# Patient Record
Sex: Male | Born: 1960
Health system: Southern US, Community
[De-identification: ages and names within clinical notes are randomized; demographics above are authoritative.]

## PROBLEM LIST (undated history)

## (undated) DIAGNOSIS — E119 Type 2 diabetes mellitus without complications: Secondary | ICD-10-CM

## (undated) DIAGNOSIS — Z8719 Personal history of other diseases of the digestive system: Secondary | ICD-10-CM

## (undated) DIAGNOSIS — M79605 Pain in left leg: Secondary | ICD-10-CM

## (undated) DIAGNOSIS — G4733 Obstructive sleep apnea (adult) (pediatric): Secondary | ICD-10-CM

## (undated) DIAGNOSIS — E785 Hyperlipidemia, unspecified: Secondary | ICD-10-CM

## (undated) DIAGNOSIS — M545 Low back pain, unspecified: Secondary | ICD-10-CM

## (undated) DIAGNOSIS — I480 Paroxysmal atrial fibrillation: Secondary | ICD-10-CM

## (undated) DIAGNOSIS — I2510 Atherosclerotic heart disease of native coronary artery without angina pectoris: Secondary | ICD-10-CM

## (undated) DIAGNOSIS — F32A Depression, unspecified: Secondary | ICD-10-CM

## (undated) DIAGNOSIS — M4310 Spondylolisthesis, site unspecified: Secondary | ICD-10-CM

## (undated) DIAGNOSIS — G43909 Migraine, unspecified, not intractable, without status migrainosus: Secondary | ICD-10-CM

## (undated) DIAGNOSIS — I10 Essential (primary) hypertension: Secondary | ICD-10-CM

## (undated) DIAGNOSIS — M48061 Spinal stenosis, lumbar region without neurogenic claudication: Secondary | ICD-10-CM

## (undated) DIAGNOSIS — M797 Fibromyalgia: Secondary | ICD-10-CM

## (undated) DIAGNOSIS — K219 Gastro-esophageal reflux disease without esophagitis: Secondary | ICD-10-CM

## (undated) DIAGNOSIS — F419 Anxiety disorder, unspecified: Secondary | ICD-10-CM

## (undated) DIAGNOSIS — I219 Acute myocardial infarction, unspecified: Secondary | ICD-10-CM

## (undated) DIAGNOSIS — G8929 Other chronic pain: Secondary | ICD-10-CM

## (undated) DIAGNOSIS — F329 Major depressive disorder, single episode, unspecified: Secondary | ICD-10-CM

## (undated) HISTORY — DX: Pain in left leg: M79.605

## (undated) HISTORY — DX: Paroxysmal atrial fibrillation: I48.0

## (undated) HISTORY — DX: Low back pain, unspecified: M54.50

## (undated) HISTORY — DX: Acute myocardial infarction, unspecified: I21.9

## (undated) HISTORY — DX: Spondylolisthesis, site unspecified: M43.10

## (undated) HISTORY — DX: Spinal stenosis, lumbar region without neurogenic claudication: M48.061

---

## 1998-05-11 HISTORY — PX: INGUINAL HERNIA REPAIR: SUR1180

## 2003-05-12 DIAGNOSIS — I219 Acute myocardial infarction, unspecified: Secondary | ICD-10-CM

## 2003-05-12 HISTORY — DX: Acute myocardial infarction, unspecified: I21.9

## 2004-03-11 DIAGNOSIS — I2510 Atherosclerotic heart disease of native coronary artery without angina pectoris: Secondary | ICD-10-CM

## 2004-03-11 HISTORY — PX: CORONARY ANGIOPLASTY WITH STENT PLACEMENT: SHX49

## 2004-03-11 HISTORY — DX: Atherosclerotic heart disease of native coronary artery without angina pectoris: I25.10

## 2004-04-07 ENCOUNTER — Ambulatory Visit: Payer: Self-pay | Admitting: Cardiology

## 2004-04-07 ENCOUNTER — Ambulatory Visit: Payer: Self-pay | Admitting: *Deleted

## 2004-04-07 ENCOUNTER — Inpatient Hospital Stay (HOSPITAL_COMMUNITY): Admission: AD | Admit: 2004-04-07 | Discharge: 2004-04-10 | Payer: Self-pay | Admitting: *Deleted

## 2008-05-11 DIAGNOSIS — E119 Type 2 diabetes mellitus without complications: Secondary | ICD-10-CM

## 2008-05-11 HISTORY — DX: Type 2 diabetes mellitus without complications: E11.9

## 2008-10-01 ENCOUNTER — Encounter: Admission: RE | Admit: 2008-10-01 | Discharge: 2008-10-01 | Payer: Self-pay | Admitting: Gastroenterology

## 2010-09-26 NOTE — Cardiovascular Report (Signed)
[name], [name]               ACCOUNT NO.:  [id number]   MEDICAL RECORD NO.:  [id number]          PATIENT TYPE:  INP   LOCATION:  2899                         FACILITY:  [facility]   PHYSICIAN:  Carole Binning, M.D. LHCDATE OF BIRTH:  July 07, 1960   DATE OF PROCEDURE:  04/07/2004  DATE OF DISCHARGE:                              CARDIAC CATHETERIZATION   PROCEDURE PERFORMED:  1.  Left heart catheterization with coronary angiography and left      ventriculography.  2.  Percutaneous coronary intervention with placement of three drug-eluting      stents in the proximal and mid right coronary artery.   INDICATION:  [name] is a 50 year old male who presented to [facility] yesterday with substernal chest pain.  This morning at  approximately 4 a.m. he developed severe recurrent substernal chest pain.  Followup EKG showed 1/2 to 1 mm inferior ST segment elevation with Q waves  in the inferior leads.  He was seen by Dr. Tomie China and transferred  emergently to [facility].  He arrived here at approximately 11:30  a.m. which was approximately 7 1/2 hours after onset of chest pain.   CATHETERIZATION PROCEDURAL NOTE:  A 6 French sheath was placed in the right  femoral artery.  Coronary angiography was performed with standard Judkins 6  French catheters.  Left ventriculography was performed with an angled  pigtail catheter.  Contrast was Omnipaque.  There were no complications.   CATHETERIZATION RESULTS:   HEMODYNAMICS:  1.  Left ventricular pressure 106/22.  2.  Aortic pressure 110/86.  3.  There is no aortic valve gradient.   LEFT VENTRICULOGRAM:  There is severe akinesis of the inferior wall.  Ejection fraction is calculated at 47%.  There is no mitral regurgitation.   CORONARY ARTERIOGRAPHY:  Left main is normal.   Left anterior descending artery has a 30% stenosis in the proximal vessel,  40% in the mid vessel, and 40% in the distal vessel.  The LAD gives rise  to  two small diagonal branches.   Left circumflex gives rise to a large ramus intermedius, large first obtuse  marginal branch, and a small second obtuse marginal branch.  The ramus  intermedius has a diffuse 50% stenosis in the mid body.  The first obtuse  marginal has a 40% stenosis at its ostium and a 75% stenosis in the mid  segment.  The second obtuse marginal has a 40% stenosis proximally and a 75%  stenosis in the mid vessel.   Right coronary artery is a dominant vessel.  There is diffuse 90% stenosis  in the proximal and mid vessel.  The distal right coronary just beyond the  acute marginal is 100% occluded with thrombus and TIMI-0 flow.  After we  established reperfusion the distal right coronary artery was found to be a  large vessel.  There was an 80% stenosis beyond the regional site of  occlusion.  Further down in the distal vessel just beyond a large posterior  descending artery there is a 30% stenosis.  The distal right coronary artery  gives rise to  a large posterior descending artery, normal size first  posterior lateral branch, large second posterior lateral branch, and a small  third posterior lateral branch.  The first posterior lateral branch has a  60% stenosis at the ostium and the second posterior lateral branch has a 30%  stenosis in the proximal body.   IMPRESSION:  1.  Mildly decreased left ventricular systolic function with akinesis of the      inferior wall secondary to an acute inferior wall myocardial infarction.  2.  Two-vessel coronary artery disease characterized by 100% occlusion of      the distal right coronary artery with significant disease in the      proximal and mid vessel as well.  There is moderate disease in the left      circumflex artery as described.   PLAN:  Percutaneous intervention of the right coronary artery.  See below.   PERCUTANEOUS TRANSLUMINAL CORONARY ANGIOPLASTY PROCEDURAL NOTE:  Following  completion of the diagnostic  catheterization, we proceeded directly with  percutaneous coronary intervention.  A 6 French sheath was placed in the  right femoral vein.  Heparin and integrilin were administered per protocol.  We used a 6 [place] JR-4 guiding catheter.  A Hi-Torque Floppy wire was  advanced under fluoroscopic guidance and advanced successfully beyond the  occlusion into the position of the distal vessel.  This did establish  partial reperfusion.  We then performed percutaneous transluminal coronary  angioplasty with a 3.0 x 15-mm Maverick balloon.  This balloon was inflated  to 6 atmospheres for two inflations in the distal vessel, 12 atmospheres in  the mid vessel and 12 atmospheres in the proximal vessel.  This resulted in  improvement in the vessel.  However, there was development of a moderate  spiral dissection in the mid vessel.  We then advanced a 3.5 x 28-mm Cypher  drug-eluting stent and positioned this across the diseased segment of the  distal vessel with extending just proximal to the acute marginal.  This  stent was deployed at 12 atmospheres.  We then advanced a second 3.5 x 33-mm  Cypher drug-eluting stent and positioned this in the mid vessel with minimal  overlap of the just placed stent in the distal vessel.  This stent was  deployed at 16 atmospheres.  After the second stent there was residual  disease in the proximal vessel.  We therefore advanced a third 3.5 x 13-mm  Cypher drug-eluting stent and positioned this in the proximal vessel with  overlap in the stent in the mid vessel.  This stent was deployed at 20  atmospheres.  Following this, we went back with a 3.75 x 20-mm Quantum  balloon and inflated this to 18 atmospheres in the distal aspects of the  overlapping stents followed by multiple inflations of 20 atmospheres  throughout the mid and proximal aspects of the stents.  We then went back one more time into the distal aspect of the stent and inflated the balloon  to 22  atmospheres.  Intermittent doses of intracoronary nitroglycerin were  administered and final angiographic images were obtained revealing patency  of the right coronary with 0% residual stenosis at the stent site and TIMI-3  flow into the distal vessel.   COMPLICATIONS:  None.   RESULTS:  Successful percutaneous coronary intervention with placement of  three overlapping drug-eluting stents in the proximal, mid, and distal right  coronary artery.  The distal vessel was reduced from 100% followed by an 80%  stenosis  with TIMI-0 flow to 0% residual with TIMI-3 flow.  The proximal and  mid vessel was reduced from a diffuse 90% stenosis to 0% residual with TIMI-  3 flow.   PLAN:  Integrilin will be continued for 24 hours.  The patient will be  started on Plavix which should be continued for a minimum of one year.  I  would recommend aggressive risk factor modification.  Would also recommend a  stress nuclear scan in 4-6 weeks to assess the significance of the residual  disease in the left circumflex coronary artery.       MWP/MEDQ  D:  04/07/2004  T:  04/07/2004  Job:  [id number]   cc:   Windle Guard, M.D.  [address]  [place], [place] [address]  Fax: [phone]   Aundra Dubin. Revankar, M.D.  [address]  [place]  [place] [address]  Fax: [phone]

## 2010-09-26 NOTE — Discharge Summary (Signed)
[name], [name]               ACCOUNT NO.:  [id number]   MEDICAL RECORD NO.:  [id number]          PATIENT TYPE:  INP   LOCATION:  3703                         FACILITY:  [facility]   PHYSICIAN:  Learta Codding, M.D. LHCDATE OF BIRTH:  November 21, 1960   DATE OF ADMISSION:  04/07/2004  DATE OF DISCHARGE:  04/10/2004                           DISCHARGE SUMMARY - REFERRING   PROCEDURE:  Emergent coronary angiography/Cypher stenting right coronary  artery, April 07, 2004.   REASON FOR ADMISSION:  [name] is a 50 year old male with no prior  history of coronary artery disease, who initially presented to [facility] with chest pain.  He was found to have ST elevation inferiorly and,  following, stabilization, was transferred to [facility] for further  management.  Please refer to dictated admission note for full details.   LABORATORY DATA:  Cardiac enzymes:  Peak CPK 116, 1/76; troponin I 40.9 on  admission.  Lipid profile:  Total cholesterol 161, triglycerides 361, HDL  22, LDL 72 (cholesterol/HDL ratio 7.3).  TSH 0.96.  Hemoglobin A1c 10.2.  Electrolytes and renal function remained normal.  CBC normal, with  hemoglobin 14.7 on admission.   HOSPITAL COURSE:  Following transfer from [facility], the patient  continued to have chest discomfort and was thus taken directly to the  cardiac catheterization lab for emergent coronary angiography and  intervention.   Procedure performed ( by Dr. Daisey Must ), revealed total occlusion of  the mid-right coronary artery, as well as 90% proximal disease and 80%  distally.  The patient underwent successful treatment with Cypher stenting  (x 3) of the right coronary artery to 0% residual stenosis.  There were no  noted complications.   The patient will need to remain on Plavix for one year.  He will need a  follow-up exercise perfusion study in four to six weeks for assessment of  residual circumflex disease.   Postoperative  course benign, with no documented dysrhythmia or complaints of  chest pain.   Medications were adjusted as tolerated.  The patient was placed on glyburide  for management of new onset type 2 diabetes mellitus.  He is to follow up  with his primary care physician for further management.  Recommendation was  to consider addition of Glucophage as an outpatient.   At discharge, the patient was provided with mail-order forms for pharmacy  outreach assistance for his prescription medications.   MEDICATIONS AT DISCHARGE:  1.  Plavix 75 mg daily (x 1 year).  2.  Coated aspirin 325 mg daily.  3.  Mevacor 40 mg daily.  4.  Lisinopril 20 mg daily.  5.  Lopressor 25 mg b.i.d.  6.  Glyburide 5 mg daily.  7.  Zantac 150 mg b.i.d.  8.  Nitrostat 0.4 mg p.r.n.   INSTRUCTIONS:  1.  No strenuous activity/heavy lifting or return to work until seen by the      physician.  2.  Maintain a lowfat/cholesterol diabetic diet.  3.  Patient instructed to arrange followup with Dr. Glean Hess [name] in two      weeks.  4.  Patient instructed to follow up with his primary care physician, Dr.      Windle [name], in the next few weeks for management of diabetes.  5.  Exercise- cardiolite stress study in 4-6 weeks to assess residual      circumflex coronary artery disease.   DISCHARGE DIAGNOSES:  1.  ST elevation inferior myocardial infarction.      1.  Emergent Cypher stenting right coronary artery (x 3), April 07, 2004.      2.  Residual circumflex disease.      3.  Mild left ventricular dysfunction (ejection fraction 47%).  2.  Type 2 diabetes mellitus.  3.  Dyslipidemia.  4.  Gastroesophageal reflux disease.      [name] [name]   GS/MEDQ  D:  04/10/2004  T:  04/10/2004  Job:  [id number]   cc:   Aundra Dubin. [name], M.D.  [address]  [place]  [place] [address]  Fax: [phone]   [name] [name], M.D.  [address]  [place], [place] [address]  Fax: [phone]

## 2010-09-26 NOTE — H&P (Signed)
[name] [name], [name] [name]               ACCOUNT NO.:  [id number]   MEDICAL RECORD NO.:  [id number]          PATIENT TYPE:  OIB   LOCATION:  2899                         FACILITY:  [facility]   PHYSICIAN:  Learta Codding, M.D. LHCDATE OF BIRTH:  11/04/1960   DATE OF ADMISSION:  04/07/2004  DATE OF DISCHARGE:                                HISTORY & PHYSICAL   PRIMARY CARE PHYSICIAN:  Windle Guard, M.D.   PRIMARY CARDIOLOGIST:  Aundra Dubin. Revankar, M.D., [place], [place].   HISTORY OF PRESENT ILLNESS:  The patient is a 50 year old white male who was  referred to [facility] for a cardiac evaluation, secondary  to chest discomfort.  He stated that since Saturday he just has not felt  well.  He further elaborates with anterior chest indigestion feeling,  associated with shortness of breath.  He denies any nausea, vomiting, or  diaphoresis.  On Saturday, he took Tagamet which initially improved his  symptoms.  He stated that he laid around all day Saturday and Sunday;  however, he continued to have intermittent symptoms.  He stated that they  ranged from a 0-8 on a scale of 0/10.  The duration would usually be about  30-40 minutes.  He denies any occurrences.  This morning he was awakened at  around 2:30 a.m. with more intense symptoms.  He gave this 12 on a scale of  0/10.  He took some more Tagamet without any relief, and around 4 a.m. his  girlfriend drove him to the emergency room for further evaluation.  At  [facility] Emergency Room he received aspirin, Lopressor IV 5 mg x3,  morphine 2 mg IV x2, a GI cocktail, IV nitroglycerin and IV heparin.  His  electrocardiogram performed at 5 a.m. showed a normal sinus rhythm, normal  axis, incomplete right bundle branch block.  He has approximately 0.5 to 1.0  mm ST segment elevation inferiorly in lead III and aVF, with T-wave  inversion.  Old electrocardiograms are not available for comparison.   ALLERGIES:  No known  drug allergies.   PAST MEDICAL HISTORY:  1.  He has a history of diet-controlled diabetes.  Prior to this weekend his      sugars ranged from 110-130's.  He denies any associated neuropathies.      This weekend on Saturday his sugars began increasing to the 200-300's.  2.  He also has a history of a hiatal hernia/gastroesophageal reflux disease      and status post right inguinal hernia repair.  He does not know his cholesterol status.  He denies a history of prior  myocardial infarction, CVA, hypertension, chronic obstructive pulmonary  disease, bleeding, or thyroid disorders.   CURRENT MEDICATIONS:  1.  He takes Tagamet on a daily basis.   SOCIAL HISTORY:  He resides in [place] with his girlfriend of five years,  [name].  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  He is actively involved in horses.  He denies any history  of tobacco use.  Drinks three to four beers per  week.  He denies any drug or  herbal medication use, and follows a low sugar diet.  He does not exercise  on a regular basis.   FAMILY HISTORY:  His mother is alive at age 31.  She has multiple heart  problems.  He is not specific as to what type.  His father is age 9, and  has had stents, bypass surgery, diabetes, a CVA and an ICD placement.  His  mother and father are both followed by Dr. Pearletha Furl. [name].  He has a  sister who is alive and well.  She has a history of diabetes.   REVIEW OF SYSTEMS:  Notable for the above-mentioned symptoms as well as  increased nocturia since Saturday.  Occasional diarrhea and gastroesophageal  reflux disease symptoms.   PHYSICAL EXAMINATION:  GENERAL:  A well-developed and well-nourished  pleasant white male, in no apparent distress, although at this time he  states that his discomfort is approximately a 2/10.  He is in the cardiac  catheterization holding area at this time.  VITAL SIGNS:  Blood pressure 123/89, pulse 83,  respirations 20.  HEENT:  Normocephalic and atraumatic.  Pupils equal, round, reactive to  light and accommodation.  EOMs intact.  NECK:  Supple without thyromegaly, adenopathy, jugular venous distention, or  carotid bruits.  HEART:  A regular rate and rhythm.  No murmurs, rubs, clicks, or gallops  appreciated.  All pulses are symmetrical and equal, without carotid or  femoral or abdominal bruits.  SKIN:  Integument was intact.  ABDOMEN:  Obese, bowel sounds present without organomegaly, masses, or  tenderness.  EXTREMITIES:  Negative for clubbing, cyanosis or edema.  MUSCULOSKELETAL:  Unremarkable.  NEUROLOGIC:  Intact.  A chest x-ray from [facility] did not show any active disease.  H&H 18.0 and 51.4, normal indices, platelets 228, WBC's 7.1.  Sodium 138,  potassium 3.9, BUN 15, creatinine 1.0, glucose 306.  His liver function  tests were notable for an elevated ALT of 81 and an ALP of 138.  PTT was  32.2, PT 11.6.  Initial CK was 125, troponin 0.11.  Second CK at [facility] was 153 and a troponin of 0.41.   IMPRESSION:  1.  ST-elevated myocardial infarction in the inferior distribution.  2.  Elevated liver function tests, of uncertain etiology.  3.  Hyperglycemia, related to the myocardial infarction, with a history of      diabetes.   DISPOSITION:  Dr. Learta Codding reviewed the patient's history and spoke  with and examined the patient.  He agrees with above.  The patient will  undergo an emergent cardiac catheterization.  During his hospitalization, we  will also check his lipids and hemoglobin A1c.  We will recheck his liver  function tests, in the anticipation that he will need a statin drug.  Further plan will be post-cardiac catheterization.      [name]   EW/MEDQ  D:  04/07/2004  T:  04/07/2004  Job:  [id number]

## 2011-08-05 ENCOUNTER — Emergency Department (HOSPITAL_COMMUNITY)
Admission: EM | Admit: 2011-08-05 | Discharge: 2011-08-06 | Disposition: A | Discharge status: Home / Self Care | Payer: Self-pay | Attending: Emergency Medicine | Admitting: Emergency Medicine

## 2011-08-05 ENCOUNTER — Encounter (HOSPITAL_COMMUNITY): Payer: Self-pay | Admitting: Emergency Medicine

## 2011-08-05 ENCOUNTER — Emergency Department (HOSPITAL_COMMUNITY): Payer: Self-pay

## 2011-08-05 DIAGNOSIS — E119 Type 2 diabetes mellitus without complications: Secondary | ICD-10-CM | POA: Insufficient documentation

## 2011-08-05 DIAGNOSIS — I2510 Atherosclerotic heart disease of native coronary artery without angina pectoris: Secondary | ICD-10-CM | POA: Insufficient documentation

## 2011-08-05 DIAGNOSIS — R079 Chest pain, unspecified: Secondary | ICD-10-CM | POA: Insufficient documentation

## 2011-08-05 DIAGNOSIS — Z9889 Other specified postprocedural states: Secondary | ICD-10-CM | POA: Insufficient documentation

## 2011-08-05 DIAGNOSIS — Z79899 Other long term (current) drug therapy: Secondary | ICD-10-CM | POA: Insufficient documentation

## 2011-08-05 HISTORY — DX: Atherosclerotic heart disease of native coronary artery without angina pectoris: I25.10

## 2011-08-05 LAB — CBC
HCT: 45 % (ref 39.0–52.0)
Hemoglobin: 16.5 g/dL (ref 13.0–17.0)
MCH: 31.1 pg (ref 26.0–34.0)
MCHC: 36.7 g/dL — ABNORMAL HIGH (ref 30.0–36.0)
MCV: 84.7 fL (ref 78.0–100.0)
Platelets: 157 10*3/uL (ref 150–400)
RBC: 5.31 MIL/uL (ref 4.22–5.81)
RDW: 12.5 % (ref 11.5–15.5)
WBC: 6.5 10*3/uL (ref 4.0–10.5)

## 2011-08-05 LAB — BASIC METABOLIC PANEL
BUN: 10 mg/dL (ref 6–23)
CO2: 23 mEq/L (ref 19–32)
Calcium: 9.2 mg/dL (ref 8.4–10.5)
Chloride: 104 mEq/L (ref 96–112)
Creatinine, Ser: 0.65 mg/dL (ref 0.50–1.35)
GFR calc Af Amer: >90 mL/min (ref >90)
GFR calc non Af Amer: >90 mL/min (ref >90)
Glucose, Bld: 265 mg/dL — ABNORMAL HIGH (ref 70–99)
Potassium: 3.8 mEq/L (ref 3.5–5.1)
Sodium: 139 mEq/L (ref 135–145)

## 2011-08-05 LAB — TROPONIN I: Troponin I: <0.3 ng/mL (ref <0.30)

## 2011-08-05 MED ORDER — GI COCKTAIL ~~LOC~~
30.0000 mL | Freq: Once | ORAL | Status: AC
  Administered 2011-08-05: 30 mL via ORAL
  Filled 2011-08-05: qty 30

## 2011-08-05 MED ORDER — ASPIRIN 81 MG PO CHEW
324.0000 mg | CHEWABLE_TABLET | Freq: Once | ORAL | Status: AC
  Administered 2011-08-05: 324 mg via ORAL
  Filled 2011-08-05: qty 4

## 2011-08-05 MED ORDER — OXYCODONE-ACETAMINOPHEN 5-325 MG PO TABS
1.0000 | ORAL_TABLET | Freq: Once | ORAL | Status: AC
  Administered 2011-08-05: 1 via ORAL
  Filled 2011-08-05: qty 1

## 2011-08-05 NOTE — ED Provider Notes (Signed)
History    51 year old male with chest pain. Gradual onset about 2 days ago. Substernal to left chest. Relatively constant without radiation. No appreciable exacerbating relieving factors. No dyspnea. No nausea, vomiting or palpitations. No unusual leg pain or swelling. Denies history of blood clot. Does have history of known coronary artery disease status post stenting in 2005. Patient is also diabetic. Patient reports compliance with his medications.  CSN: [id number]  Arrival date & time 08/05/11  7829   First MD Initiated Contact with Patient 08/05/11 2100      Chief Complaint  Patient presents with  . Chest Pain    describes as dull chest pressure and L arm soreness also reports indigestion    (Consider location/radiation/quality/duration/timing/severity/associated sxs/prior treatment) HPI  Past Medical History  Diagnosis Date  . Diabetes mellitus   . Coronary artery disease   . Apnea, sleep     Past Surgical History  Procedure Date  . Hernia repair   . Coronary stent placement     History reviewed. No pertinent family history.  History  Substance Use Topics  . Smoking status: Never Smoker   . Smokeless tobacco: Not on file  . Alcohol Use: Yes     occassioanal glass of wine      Review of Systems   Review of symptoms negative unless otherwise noted in HPI.   Allergies  Review of patient's allergies indicates no known allergies.  Home Medications   Current Outpatient Rx  Name Route Sig Dispense Refill  . METFORMIN HCL 500 MG PO TABS Oral Take 500 mg by mouth 2 (two) times daily with a meal.    . NAPROXEN 500 MG PO TABS Oral Take 500 mg by mouth 2 (two) times daily with a meal.      BP 157/86  Pulse 72  Temp(Src) 98.5 F (36.9 C) (Oral)  Resp 18  Ht 5\' 11"  (1.803 m)  Wt 200 lb (90.719 kg)  BMI 27.89 kg/m2  SpO2 98%  Physical Exam  Nursing note and vitals reviewed. Constitutional: He appears well-developed and well-nourished. No distress.    Laying in bed. No Acute distress.  HENT:  Head: Normocephalic and atraumatic.  Eyes: Conjunctivae are normal. Right eye exhibits no discharge. Left eye exhibits no discharge.  Neck: Neck supple.  Cardiovascular: Normal rate, regular rhythm and normal heart sounds.  Exam reveals no gallop and no friction rub.   No murmur heard. Pulmonary/Chest: Effort normal and breath sounds normal. No respiratory distress.  Abdominal: Soft. He exhibits no distension. There is tenderness.       Mild tenderness in epigastrium without rebound or guarding. No distention. No mass palpated.  Musculoskeletal: He exhibits no edema and no tenderness.       Lower extremity are symmetric as compared to each other. No significant edema. No Tenderness.  Neurological: He is alert.  Skin: Skin is warm and dry. He is not diaphoretic.  Psychiatric: He has a normal mood and affect. His behavior is normal. Thought content normal.    ED Course  Procedures (including critical care time)  Labs Reviewed  CBC - Abnormal; Notable for the following:    MCHC 36.7 (*)    All other components within normal limits  BASIC METABOLIC PANEL - Abnormal; Notable for the following:    Glucose, Bld 265 (*)    All other components within normal limits  TROPONIN I   Dg Chest 2 View  08/05/2011  *RADIOLOGY REPORT*  Clinical Data: Chest pain; history of  diabetes.  CHEST - 2 VIEW  Comparison: None.  Findings: The lungs are relatively well-aerated.  Minimal bibasilar airspace opacities likely reflect atelectasis.  There is no evidence of pleural effusion or pneumothorax.  The heart is borderline normal in size; the mediastinal contour is within normal limits.  No acute osseous abnormalities are seen.  IMPRESSION: Minimal bibasilar airspace opacities likely reflect atelectasis; lungs otherwise clear.  Original Report Authenticated By: Tonia Ghent, M.D.   EKG:  Rhythm: Normal sinus rhythm  Rate: 90 Axis: Normal Intervals: Normal ST  segments: Q waves inferiorly Comparison: Q waves noted on prior EKG from 2005. No significant change.   1. Chest pain       MDM  50yM with CP. Consider ACS, especially with cardiac hx. Pain atypical though given constant for several days. EKG with no significant change from previous. Trop normal. Doubt PE. Doubt infectious. Possibly GI and patient is actually tenderness in epigastrium. Low suspicion for emergent etiology. Outpt fu with cardiologist. Return precautions discussed.        Raeford Razor, MD 08/12/11 [phone]

## 2011-08-05 NOTE — ED Notes (Signed)
MD at bedside. 

## 2011-08-05 NOTE — ED Notes (Signed)
Pt report L chest pain onset x3 days Hx CAD with cath and stent placement 2005 possible by [facility]

## 2011-08-05 NOTE — Discharge Instructions (Signed)
Chest Pain (Nonspecific) It is often hard to give a specific diagnosis for the cause of chest pain. There is always a chance that your pain could be related to something serious, such as a heart attack or a blood clot in the lungs. You need to follow up with your caregiver for further evaluation. CAUSES   Heartburn.   Pneumonia or bronchitis.   Anxiety or stress.   Inflammation around your heart (pericarditis) or lung (pleuritis or pleurisy).   A blood clot in the lung.   A collapsed lung (pneumothorax). It can develop suddenly on its own (spontaneous pneumothorax) or from injury (trauma) to the chest.   Shingles infection (herpes zoster virus).  The chest wall is composed of bones, muscles, and cartilage. Any of these can be the source of the pain.  The bones can be bruised by injury.   The muscles or cartilage can be strained by coughing or overwork.   The cartilage can be affected by inflammation and become sore (costochondritis).  DIAGNOSIS  Lab tests or other studies, such as X-rays, electrocardiography, stress testing, or cardiac imaging, may be needed to find the cause of your pain.  TREATMENT   Treatment depends on what may be causing your chest pain. Treatment may include:   Acid blockers for heartburn.   Anti-inflammatory medicine.   Pain medicine for inflammatory conditions.   Antibiotics if an infection is present.   You may be advised to change lifestyle habits. This includes stopping smoking and avoiding alcohol, caffeine, and chocolate.   You may be advised to keep your head raised (elevated) when sleeping. This reduces the chance of acid going backward from your stomach into your esophagus.   Most of the time, nonspecific chest pain will improve within 2 to 3 days with rest and mild pain medicine.  HOME CARE INSTRUCTIONS   If antibiotics were prescribed, take your antibiotics as directed. Finish them even if you start to feel better.   For the next few  days, avoid physical activities that bring on chest pain. Continue physical activities as directed.   Do not smoke.   Avoid drinking alcohol.   Only take over-the-counter or prescription medicine for pain, discomfort, or fever as directed by your caregiver.   Follow your caregiver's suggestions for further testing if your chest pain does not go away.   Keep any follow-up appointments you made. If you do not go to an appointment, you could develop lasting (chronic) problems with pain. If there is any problem keeping an appointment, you must call to reschedule.  SEEK MEDICAL CARE IF:   You think you are having problems from the medicine you are taking. Read your medicine instructions carefully.   Your chest pain does not go away, even after treatment.   You develop a rash with blisters on your chest.  SEEK IMMEDIATE MEDICAL CARE IF:   You have increased chest pain or pain that spreads to your arm, neck, jaw, back, or abdomen.   You develop shortness of breath, an increasing cough, or you are coughing up blood.   You have severe back or abdominal pain, feel nauseous, or vomit.   You develop severe weakness, fainting, or chills.   You have a fever.  THIS IS AN EMERGENCY. Do not wait to see if the pain will go away. Get medical help at once. Call your local emergency services (911 in [place] Do not drive yourself to the hospital. MAKE SURE YOU:   Understand these instructions.     Will watch your condition.   Will get help right away if you are not doing well or get worse.  Document Released: 02/04/2005 Document Revised: 04/16/2011 Document Reviewed: 12/01/2007 [facility] Patient Information 2012 [place], [place].  RESOURCE GUIDE  Dental Problems  Patients with Medicaid: [facility] [id number]                                           [phone] [address] Phone:  [phone]                                                   Phone:  [phone]  If unable to pay or uninsured, contact:  [facility] or [facility] to become qualified for the adult dental clinic.  Chronic Pain Problems Contact [facility]  [phone] Patients need to be referred by their primary care doctor.  Insufficient Money for Medicine Contact [employer]:  call "211" or [facility] [phone].  No Primary Care Doctor Call [facility]  [phone] Other agencies that provide inexpensive medical care    [facility]  [phone]    [facility]  [phone]    [facility]  [phone]    [facility]  [phone]    [facility]  [phone]    [facility]  [phone]  Psychological Services [facility]  [phone] [facility]  [phone] [facility]   [phone] (emergency services [phone])  Substance Abuse Resources [facility]  [phone] [facility] [phone] [facility] [place] [phone] Floydene Flock ([phone] Residential & Outpatient Substance Abuse Program  ([phone]  Abuse/Neglect [facility] Child Abuse Hotline [phone]) 641-6472 [facility] Child Abuse Hotline 602-274-4343 (After Hours)  Emergency Shelter [facility] [phone]  Maternity Homes Room at the [place] [facility] [place] [phone] [place] Services [phone]  MRSA Hotline #:   667 463 8459    [facility] Resources  [facility] [place]     [employer]                          [facility] Dept. [address] [place]                       [address]      [address]  [place]                                                Cristobal Goldmann Phone:  [phone]  Phone:  [phone]                 Phone:  [phone]  [facility] Phone:   [phone]  [facility] Child Abuse Hotline [phone] (After Hours)

## 2011-08-06 NOTE — ED Notes (Signed)
Pt left before discharge instruction could be given. Presented pt with phone numbers to [facility] and [facility]

## 2012-08-01 ENCOUNTER — Emergency Department (HOSPITAL_COMMUNITY)
Admission: EM | Admit: 2012-08-01 | Discharge: 2012-08-01 | Disposition: A | Discharge status: Home / Self Care | Payer: Self-pay | Attending: Emergency Medicine | Admitting: Emergency Medicine

## 2012-08-01 ENCOUNTER — Encounter (HOSPITAL_COMMUNITY): Payer: Self-pay | Admitting: Emergency Medicine

## 2012-08-01 DIAGNOSIS — I2510 Atherosclerotic heart disease of native coronary artery without angina pectoris: Secondary | ICD-10-CM | POA: Insufficient documentation

## 2012-08-01 DIAGNOSIS — Z7982 Long term (current) use of aspirin: Secondary | ICD-10-CM | POA: Insufficient documentation

## 2012-08-01 DIAGNOSIS — M5431 Sciatica, right side: Secondary | ICD-10-CM

## 2012-08-01 DIAGNOSIS — M549 Dorsalgia, unspecified: Secondary | ICD-10-CM

## 2012-08-01 DIAGNOSIS — G4730 Sleep apnea, unspecified: Secondary | ICD-10-CM | POA: Insufficient documentation

## 2012-08-01 DIAGNOSIS — M5410 Radiculopathy, site unspecified: Secondary | ICD-10-CM

## 2012-08-01 DIAGNOSIS — Z79899 Other long term (current) drug therapy: Secondary | ICD-10-CM | POA: Insufficient documentation

## 2012-08-01 DIAGNOSIS — M5430 Sciatica, unspecified side: Secondary | ICD-10-CM | POA: Insufficient documentation

## 2012-08-01 DIAGNOSIS — E119 Type 2 diabetes mellitus without complications: Secondary | ICD-10-CM | POA: Insufficient documentation

## 2012-08-01 DIAGNOSIS — M5432 Sciatica, left side: Secondary | ICD-10-CM

## 2012-08-01 DIAGNOSIS — Z791 Long term (current) use of non-steroidal anti-inflammatories (NSAID): Secondary | ICD-10-CM | POA: Insufficient documentation

## 2012-08-01 MED ORDER — METHOCARBAMOL 500 MG PO TABS
500.0000 mg | ORAL_TABLET | Freq: Once | ORAL | Status: AC
  Administered 2012-08-01: 500 mg via ORAL
  Filled 2012-08-01: qty 1

## 2012-08-01 MED ORDER — HYDROCODONE-ACETAMINOPHEN 5-325 MG PO TABS
2.0000 | ORAL_TABLET | Freq: Once | ORAL | Status: AC
  Administered 2012-08-01: 2 via ORAL
  Filled 2012-08-01: qty 2

## 2012-08-01 MED ORDER — OXYCODONE-ACETAMINOPHEN 5-325 MG PO TABS: 2.0000 | ORAL_TABLET | ORAL | Status: DC | PRN

## 2012-08-01 MED ORDER — METHOCARBAMOL 500 MG PO TABS: 500.0000 mg | ORAL_TABLET | Freq: Two times a day (BID) | ORAL | Status: DC | PRN

## 2012-08-01 MED ORDER — HYDROMORPHONE HCL PF 1 MG/ML IJ SOLN
1.0000 mg | Freq: Once | INTRAMUSCULAR | Status: AC
  Administered 2012-08-01: 1 mg via INTRAMUSCULAR
  Filled 2012-08-01: qty 1

## 2012-08-01 NOTE — ED Provider Notes (Signed)
History     CSN: [id number]  Arrival date & time 08/01/12  1308   First MD Initiated Contact with Patient 08/01/12 1054      Chief Complaint  Patient presents with  . Back Pain    (Consider location/radiation/quality/duration/timing/severity/associated sxs/prior treatment) HPI Comments: This is a 52 year old man who presents today with sudden severe sharp low back pain that radiates to his legs bilaterally. He has a history of sciatica and low back pain s/p a work accident that happened in 2012. Currently he is attempting to get disability for this problem. The sciatica used to be just on his right side, but today the majority of his pain is on his left side today. He states he has never felt pain this bad in his life before. He was unable to get out of bed this morning. No bowel or bladder incontinence.   Patient is a 52 y.o. male presenting with back pain. The history is provided by the patient. No language interpreter was used.  Back Pain Location:  Lumbar spine Quality:  Stabbing Radiates to:  L posterior upper leg, R posterior upper leg, L foot and R foot Pain severity:  Severe Pain is:  Unable to specify Onset quality:  Sudden Relieved by:  Nothing Worsened by:  Ambulation, bending, bowel movement, movement, touching, twisting and palpation Ineffective treatments:  NSAIDs Associated symptoms: leg pain   Associated symptoms: no abdominal pain, no bladder incontinence, no bowel incontinence, no chest pain, no dysuria, no fever, no headaches and no perianal numbness     Past Medical History  Diagnosis Date  . Diabetes mellitus   . Coronary artery disease   . Apnea, sleep     Past Surgical History  Procedure Laterality Date  . Hernia repair    . Coronary stent placement      No family history on file.  History  Substance Use Topics  . Smoking status: Never Smoker   . Smokeless tobacco: Not on file  . Alcohol Use: Yes     Comment: occassioanal glass of wine       Review of Systems  Constitutional: Negative for fever.  Respiratory: Negative for shortness of breath.   Cardiovascular: Negative for chest pain.  Gastrointestinal: Negative for nausea, vomiting, abdominal pain and bowel incontinence.  Genitourinary: Negative for bladder incontinence and dysuria.  Musculoskeletal: Positive for back pain.  Neurological: Negative for headaches.  Psychiatric/Behavioral: Positive for confusion (he can't remember how to get home after he's gone somewhere sometimes because of the pain).  All other systems reviewed and are negative.    Allergies  Review of patient's allergies indicates no known allergies.  Home Medications   Current Outpatient Rx  Name  Route  Sig  Dispense  Refill  . aspirin EC 81 MG tablet   Oral   Take 81 mg by mouth daily.         . metFORMIN (GLUCOPHAGE) 500 MG tablet   Oral   Take 500 mg by mouth 2 (two) times daily with a meal.         . naproxen sodium (ANAPROX) 220 MG tablet   Oral   Take 880 mg by mouth 2 (two) times daily as needed (for pain).         . methocarbamol (ROBAXIN) 500 MG tablet   Oral   Take 1 tablet (500 mg total) by mouth 2 (two) times daily as needed.   20 tablet   0   . oxyCODONE-acetaminophen (  PERCOCET/ROXICET) 5-325 MG per tablet   Oral   Take 2 tablets by mouth every 4 (four) hours as needed for pain.   10 tablet   0     BP 133/89  Pulse 97  Temp(Src) 97.9 F (36.6 C) (Oral)  Resp 18  SpO2 96%  Physical Exam  Nursing note and vitals reviewed. Constitutional: He is oriented to person, place, and time. Vital signs are normal. He appears well-developed and well-nourished. He does not have a sickly appearance. He appears distressed.  HENT:  Head: Normocephalic and atraumatic.  Right Ear: External ear normal.  Left Ear: External ear normal.  Nose: Nose normal.  Eyes: Conjunctivae are normal.  Neck: Normal range of motion. No tracheal deviation present.  Cardiovascular:  Normal rate, regular rhythm, normal heart sounds, intact distal pulses and normal pulses.  Exam reveals no gallop and no friction rub.   No murmur heard. Pulmonary/Chest: Effort normal and breath sounds normal. No stridor. No respiratory distress. He has no wheezes. He has no rales.  Abdominal: Soft. He exhibits no distension. There is no tenderness.  Musculoskeletal: He exhibits tenderness.       Lumbar back: He exhibits tenderness and pain. He exhibits no bony tenderness, no swelling and no deformity.  Tenderness across low back Point tenderness on SI notch bilaterally   Neurological: He is alert and oriented to person, place, and time. He has normal strength and normal reflexes. No sensory deficit.  Skin: Skin is warm and dry. He is not diaphoretic.  Psychiatric: His speech is normal and behavior is normal.    ED Course  Procedures (including critical care time)  Labs Reviewed - No data to display No results found.   1. Bilateral sciatica   2. Back pain   3. Radiculopathy       MDM  Patient presents with severe back pain. Neuro exam grossly WNL. No bowel or bladder incontinence. Patient expressed frustration at his pain because it interferes with ADLs. Only previous pain regimen was Naproxen. Attempted to aggressively control pain in ED. Some success. Ambulatory at discharge. Given some pain medication for home. Discussed that he needed to follow up as an outpatient. States he has an appointment Friday, but he cannot remember with who. Gave him the number for neurospine. Patient / Family / Caregiver informed of clinical course, understand medical decision-making process, and agree with plan.        Mora Bellman, PA-C 08/02/12 [phone]

## 2012-08-01 NOTE — ED Notes (Signed)
Pt has hx of lower back vert problems.  Pt awoke this am unable to get out of bed.  Lower back constant pain.  10/10.  Pt in wheelchair.   Pt alert oriented X4

## 2012-08-01 NOTE — ED Notes (Signed)
Onset today when patient woke up with left lower back and left buttocks with no radiating pain.  Currently pain is 10/10 sharp with history of right side sciatica pain.

## 2012-08-02 NOTE — ED Provider Notes (Signed)
Medical screening examination/treatment/procedure(s) were performed by non-physician practitioner and as supervising physician I was immediately available for consultation/collaboration.   Suzi Roots, MD 08/02/12 [phone]

## 2014-01-04 ENCOUNTER — Encounter: Payer: Self-pay | Admitting: Family Medicine

## 2014-01-04 ENCOUNTER — Ambulatory Visit: Payer: No Typology Code available for payment source | Attending: Family Medicine | Admitting: Family Medicine

## 2014-01-04 VITALS — BP 148/96 | HR 84 | Temp 99.0°F | Resp 16 | Ht 71.0 in | Wt 185.0 lb

## 2014-01-04 DIAGNOSIS — E1140 Type 2 diabetes mellitus with diabetic neuropathy, unspecified: Secondary | ICD-10-CM | POA: Insufficient documentation

## 2014-01-04 DIAGNOSIS — F0630 Mood disorder due to known physiological condition, unspecified: Secondary | ICD-10-CM | POA: Insufficient documentation

## 2014-01-04 DIAGNOSIS — IMO0002 Reserved for concepts with insufficient information to code with codable children: Secondary | ICD-10-CM

## 2014-01-04 DIAGNOSIS — F3289 Other specified depressive episodes: Secondary | ICD-10-CM

## 2014-01-04 DIAGNOSIS — G8929 Other chronic pain: Secondary | ICD-10-CM | POA: Insufficient documentation

## 2014-01-04 DIAGNOSIS — F329 Major depressive disorder, single episode, unspecified: Secondary | ICD-10-CM

## 2014-01-04 DIAGNOSIS — N529 Male erectile dysfunction, unspecified: Secondary | ICD-10-CM | POA: Insufficient documentation

## 2014-01-04 DIAGNOSIS — F32A Depression, unspecified: Secondary | ICD-10-CM | POA: Insufficient documentation

## 2014-01-04 DIAGNOSIS — E1149 Type 2 diabetes mellitus with other diabetic neurological complication: Secondary | ICD-10-CM

## 2014-01-04 DIAGNOSIS — E1142 Type 2 diabetes mellitus with diabetic polyneuropathy: Secondary | ICD-10-CM

## 2014-01-04 DIAGNOSIS — E1169 Type 2 diabetes mellitus with other specified complication: Secondary | ICD-10-CM

## 2014-01-04 DIAGNOSIS — E119 Type 2 diabetes mellitus without complications: Secondary | ICD-10-CM

## 2014-01-04 DIAGNOSIS — M79609 Pain in unspecified limb: Secondary | ICD-10-CM

## 2014-01-04 DIAGNOSIS — Z113 Encounter for screening for infections with a predominantly sexual mode of transmission: Secondary | ICD-10-CM

## 2014-01-04 DIAGNOSIS — M545 Low back pain, unspecified: Secondary | ICD-10-CM

## 2014-01-04 DIAGNOSIS — R358 Other polyuria: Secondary | ICD-10-CM | POA: Insufficient documentation

## 2014-01-04 DIAGNOSIS — F429 Obsessive-compulsive disorder, unspecified: Secondary | ICD-10-CM

## 2014-01-04 DIAGNOSIS — F411 Generalized anxiety disorder: Secondary | ICD-10-CM

## 2014-01-04 DIAGNOSIS — R3589 Other polyuria: Secondary | ICD-10-CM | POA: Insufficient documentation

## 2014-01-04 DIAGNOSIS — F419 Anxiety disorder, unspecified: Secondary | ICD-10-CM | POA: Insufficient documentation

## 2014-01-04 DIAGNOSIS — E1165 Type 2 diabetes mellitus with hyperglycemia: Secondary | ICD-10-CM | POA: Insufficient documentation

## 2014-01-04 DIAGNOSIS — I25810 Atherosclerosis of coronary artery bypass graft(s) without angina pectoris: Secondary | ICD-10-CM

## 2014-01-04 DIAGNOSIS — E1159 Type 2 diabetes mellitus with other circulatory complications: Secondary | ICD-10-CM

## 2014-01-04 DIAGNOSIS — I10 Essential (primary) hypertension: Secondary | ICD-10-CM

## 2014-01-04 DIAGNOSIS — M79675 Pain in left toe(s): Secondary | ICD-10-CM

## 2014-01-04 DIAGNOSIS — I152 Hypertension secondary to endocrine disorders: Secondary | ICD-10-CM

## 2014-01-04 DIAGNOSIS — S92402A Displaced unspecified fracture of left great toe, initial encounter for closed fracture: Secondary | ICD-10-CM | POA: Insufficient documentation

## 2014-01-04 LAB — CBC
HCT: 50.9 % (ref 39.0–52.0)
Hemoglobin: 18.2 g/dL — ABNORMAL HIGH (ref 13.0–17.0)
MCH: 30.9 pg (ref 26.0–34.0)
MCHC: 35.8 g/dL (ref 30.0–36.0)
MCV: 86.4 fL (ref 78.0–100.0)
Platelets: 149 10*3/uL — ABNORMAL LOW (ref 150–400)
RBC: 5.89 MIL/uL — ABNORMAL HIGH (ref 4.22–5.81)
RDW: 13.6 % (ref 11.5–15.5)
WBC: 7.4 10*3/uL (ref 4.0–10.5)

## 2014-01-04 LAB — COMPLETE METABOLIC PANEL WITH GFR
ALT: 30 U/L (ref 0–53)
AST: 22 U/L (ref 0–37)
Albumin: 4.6 g/dL (ref 3.5–5.2)
Alkaline Phosphatase: 103 U/L (ref 39–117)
BUN: 13 mg/dL (ref 6–23)
CO2: 23 mEq/L (ref 19–32)
Calcium: 9.4 mg/dL (ref 8.4–10.5)
Chloride: 99 mEq/L (ref 96–112)
Creat: 0.78 mg/dL (ref 0.50–1.35)
GFR, Est African American: >89 mL/min
GFR, Est Non African American: >89 mL/min
Glucose, Bld: 367 mg/dL — ABNORMAL HIGH (ref 70–99)
Potassium: 4.3 mEq/L (ref 3.5–5.3)
Sodium: 136 mEq/L (ref 135–145)
Total Bilirubin: 0.8 mg/dL (ref 0.2–1.2)
Total Protein: 7.2 g/dL (ref 6.0–8.3)

## 2014-01-04 LAB — GLUCOSE, POCT (MANUAL RESULT ENTRY): POC Glucose: 357 mg/dl — AB (ref 70–99)

## 2014-01-04 LAB — LIPID PANEL
Cholesterol: 233 mg/dL — ABNORMAL HIGH (ref 0–200)
HDL: 34 mg/dL — ABNORMAL LOW (ref >39)
LDL Cholesterol: 124 mg/dL — ABNORMAL HIGH (ref 0–99)
Total CHOL/HDL Ratio: 6.9 Ratio
Triglycerides: 375 mg/dL — ABNORMAL HIGH (ref <150)
VLDL: 75 mg/dL — ABNORMAL HIGH (ref 0–40)

## 2014-01-04 LAB — POCT GLYCOSYLATED HEMOGLOBIN (HGB A1C): Hemoglobin A1C: 13.7

## 2014-01-04 MED ORDER — ACCU-CHEK NANO SMARTVIEW W/DEVICE KIT
1.0000 | PACK | Status: DC | PRN
Start: 2014-01-04 — End: 2015-08-06

## 2014-01-04 MED ORDER — GLIPIZIDE 5 MG PO TABS: 5.0000 mg | ORAL_TABLET | Freq: Two times a day (BID) | ORAL | Status: DC

## 2014-01-04 MED ORDER — METFORMIN HCL ER 500 MG PO TB24: 500.0000 mg | ORAL_TABLET | Freq: Every day | ORAL | Status: DC

## 2014-01-04 MED ORDER — GLUCOSE BLOOD VI STRP: 1.0000 | ORAL_STRIP | Freq: Three times a day (TID) | Status: DC

## 2014-01-04 MED ORDER — GABAPENTIN 100 MG PO CAPS: 100.0000 mg | ORAL_CAPSULE | Freq: Every day | ORAL | Status: DC

## 2014-01-04 MED ORDER — ATORVASTATIN CALCIUM 40 MG PO TABS: 40.0000 mg | ORAL_TABLET | Freq: Every day | ORAL | Status: DC

## 2014-01-04 MED ORDER — ACCU-CHEK FASTCLIX LANCETS MISC: 1.0000 | Freq: Three times a day (TID) | Status: DC

## 2014-01-04 MED ORDER — DAPAGLIFLOZIN PROPANEDIOL 5 MG PO TABS: 5.0000 mg | ORAL_TABLET | Freq: Every day | ORAL | Status: DC

## 2014-01-04 MED ORDER — LISINOPRIL 10 MG PO TABS: 10.0000 mg | ORAL_TABLET | Freq: Every day | ORAL | Status: DC

## 2014-01-04 NOTE — Assessment & Plan Note (Signed)
Pain and bruising. ? Remote fracture.  Plan for x-ray

## 2014-01-04 NOTE — Patient Instructions (Addendum)
Mr. FieldsAdinolfiThank you for coming in today. It was a pleasure meeting you. I look forward to being your primary doctor.  1. For diabetes: For your diet:  1. Make sure to eat breakfast, lunch and dinner (also may add one snack mid morning or mid afternoon).  2. Carbs: no more than 2 servings (30 gram/2oz) per meal and 1 serving per snack.  3. Exercise such that you sweat some and your heart rate goes up most days of the week.  4. Water, water, water , water water especially with taking [place]!!!! 5. Check blood sugar 2-3 x per week to get a feel for how different foods effect your blood sugar:  Goal fasting 70-130  Goal after eating < 200 6. Beware of hypoglycemia which is blood sugar < 70 with or without symptoms   Also with Farxiga, good penis hygiene, clean after urination to prevent UTI.   For low back pain: x-ray, start gabapentin at night  100 mg once nightly for one week, then 200 mg for one week, then 300 mg, plan for physical therapy.  Return in 2 weeks for blood sugar log review with nurse and repeat BMP.  Return in 4 weeks to see me for f/u DM2, and to discuss jaw pain and neck pain  We will be in touch with lab results  Dr. Armen Pickup    Diet Recommendations for Diabetes   Starchy (carb) foods include: Bread, rice, pasta, potatoes, corn, crackers, bagels, muffins, all baked goods.   Protein foods include: Meat, fish, poultry, eggs, dairy foods, and beans such as pinto and kidney beans (beans also provide carbohydrate).   1. Eat at least 3 meals and 1-2 snacks per day. Never go more than 4-5 hours while awake without eating.  2. Limit starchy foods to TWO per meal and ONE per snack. ONE portion of a starchy  food is equal to the following:   - ONE slice of bread (or its equivalent, such as half of a hamburger bun).   - 1/2 cup of a "scoopable" starchy food such as potatoes or rice.   - 1 OUNCE (28 grams) of starchy snack foods such as crackers or pretzels (look on  label).   - 15 grams of carbohydrate as shown on food label.  3. Both lunch and dinner should include a protein food, a carb food, and vegetables.   - Obtain twice as many veg's as protein or carbohydrate foods for both lunch and dinner.   - Try to keep frozen veg's on hand for a quick vegetable serving.     - Fresh or frozen veg's are best.  4. Breakfast should always include protein.    Look of the glycemic index of foods: lower glycemic index the better. E.g choose grapefruit over watermelon because grapefruit has a lower glycemic index and will not raise blood sugars.

## 2014-01-04 NOTE — Assessment & Plan Note (Signed)
A: chronic low back pain w/o sciatica P: Gabapentin titrate up as tolerated Titrate down on NSAIDs Lumbar x-ray  Plan for PT after reviewing x-ray

## 2014-01-04 NOTE — Assessment & Plan Note (Signed)
Screening HIV  

## 2014-01-04 NOTE — Assessment & Plan Note (Addendum)
A: elevated BP above goal in DM with CAD hx  P: Start ace inhibitor Consider adding low dose BB if tolerating ACE i  Repeat BMP in two weeks

## 2014-01-04 NOTE — Progress Notes (Signed)
   Subjective:    Patient ID: [name] [name], male    DOB: [birth year], 53 y.o.   MRN: [id number] CC: establish care,  HPI Concerns: neck pain, jaw pain both sides, erectile dysfunction,   1. CHRONIC DIABETES x 5 years   Disease Monitoring   Blood Sugar Ranges: does not check   Polydipsia: no   Polyuria: yes   Visual problems: no  Weight loss: yes  Erectile dysfunction: yes   Medication Compliance: no  Medication Side Effects  Hypoglycemia: no   Preventitive Health Care  Eye Exam: due   Foot Exam: done today   Diet pattern: 2-3 meals per day, 6 pack of beer per week, diet pepsi and Mt. Dew   Exercise: walking, limited by back   2. Low back pain: x 2.5 years, started with heavy lifting injury. Has had previous x-rays. Pain radiates down gluteal area both sides, worse on the R. Goes down to feet. Feeling like stepping on nails both sides.   3. Mood disorder: depression, anxiety, OCD diagnosed in 2014/2015. Triggers loss of mother 8 years ago, father passed away in 2013-05-20.       Soc Hx: chronic non smoker  Review of Systems As per HPI  Denies CP, SOB     Objective:   Physical Exam BP 148/96  Pulse 84  Temp(Src) 99 F (37.2 C) (Oral)  Resp 16  Ht  (1.803 m)  Wt 185 lb (83.915 kg)  BMI 25.81 kg/m2  SpO2 97% General appearance: alert, cooperative and no distress, thin, white male  Head: Normocephalic, without obvious abnormality, atraumatic Eyes: conjunctivae/corneas clear. PERRL, EOM's intact.  Ears: normal TM's and external ear canals both ears Nose: Nares normal. Septum midline. Mucosa normal. No drainage or sinus tenderness. Throat: normal findings: lips normal without lesions, tongue midline and normal and soft palate, uvula, and tonsils normal and abnormal findings: dentition: poor Jaw: clicking and popping with anterior laxity bilateral TMJ Lungs: clear to auscultation bilaterally Heart: regular rate and rhythm, S1, S2 normal, no murmur, click, rub  or gallop Extremities: no LE edema, ecchymoses slight tenderness L great toe on the plantar surface  Back Exam: Back: Normal Curvature, no deformities or CVA tenderness  Paraspinal Tenderness: L5-S1 b/l and midline   LE Strength 4/5  LE Sensation: in tact  LE Reflexes 2+ and symmetric  Straight leg raise: negative b/l      Assessment & Plan:

## 2014-01-04 NOTE — Progress Notes (Signed)
Pt is here to establish care. Pt is here to manage his diabetes. Pt is requesting medication for his chronic pain in his lower back and legs.

## 2014-01-04 NOTE — Assessment & Plan Note (Signed)
A: h/o, no active angina P:  Statin BP control with ACEi, plan for BB in the future

## 2014-01-04 NOTE — Assessment & Plan Note (Signed)
A: uncontrolled with neuropathy and erectile dysfunction P: Diet and diabetes education per AVS Log sugars Meds: orals, will most likely need insulin to get to goal Check lipids, Urine ACR, Cr Start ACEi, statin Referral to opthalmology

## 2014-01-05 LAB — MICROALBUMIN / CREATININE URINE RATIO
Creatinine, Urine: 76.4 mg/dL
Microalb Creat Ratio: 53.9 mg/g — ABNORMAL HIGH (ref 0.0–30.0)
Microalb, Ur: 4.12 mg/dL — ABNORMAL HIGH (ref 0.00–1.89)

## 2014-01-05 LAB — HIV ANTIBODY (ROUTINE TESTING W REFLEX): HIV 1&2 Ab, 4th Generation: NONREACTIVE

## 2014-01-18 ENCOUNTER — Ambulatory Visit (HOSPITAL_COMMUNITY)
Admission: RE | Admit: 2014-01-18 | Discharge: 2014-01-18 | Disposition: A | Discharge status: Home / Self Care | Payer: Self-pay | Source: Ambulatory Visit | Attending: Family Medicine | Admitting: Family Medicine

## 2014-01-18 ENCOUNTER — Ambulatory Visit: Payer: No Typology Code available for payment source | Attending: Family Medicine

## 2014-01-18 ENCOUNTER — Ambulatory Visit (HOSPITAL_COMMUNITY)
Admission: RE | Admit: 2014-01-18 | Discharge: 2014-01-18 | Disposition: A | Discharge status: Home / Self Care | Payer: No Typology Code available for payment source | Source: Ambulatory Visit | Attending: Family Medicine | Admitting: Family Medicine

## 2014-01-18 ENCOUNTER — Telehealth: Payer: Self-pay | Admitting: Family Medicine

## 2014-01-18 DIAGNOSIS — I10 Essential (primary) hypertension: Secondary | ICD-10-CM

## 2014-01-18 DIAGNOSIS — M51379 Other intervertebral disc degeneration, lumbosacral region without mention of lumbar back pain or lower extremity pain: Secondary | ICD-10-CM | POA: Insufficient documentation

## 2014-01-18 DIAGNOSIS — M79675 Pain in left toe(s): Secondary | ICD-10-CM

## 2014-01-18 DIAGNOSIS — E1159 Type 2 diabetes mellitus with other circulatory complications: Secondary | ICD-10-CM

## 2014-01-18 DIAGNOSIS — Q762 Congenital spondylolisthesis: Secondary | ICD-10-CM | POA: Insufficient documentation

## 2014-01-18 DIAGNOSIS — Z23 Encounter for immunization: Secondary | ICD-10-CM

## 2014-01-18 DIAGNOSIS — I152 Hypertension secondary to endocrine disorders: Secondary | ICD-10-CM

## 2014-01-18 DIAGNOSIS — M79609 Pain in unspecified limb: Secondary | ICD-10-CM | POA: Insufficient documentation

## 2014-01-18 DIAGNOSIS — Z113 Encounter for screening for infections with a predominantly sexual mode of transmission: Secondary | ICD-10-CM

## 2014-01-18 DIAGNOSIS — X58XXXA Exposure to other specified factors, initial encounter: Secondary | ICD-10-CM | POA: Insufficient documentation

## 2014-01-18 DIAGNOSIS — M545 Low back pain, unspecified: Secondary | ICD-10-CM | POA: Insufficient documentation

## 2014-01-18 DIAGNOSIS — S62639A Displaced fracture of distal phalanx of unspecified finger, initial encounter for closed fracture: Secondary | ICD-10-CM | POA: Insufficient documentation

## 2014-01-18 DIAGNOSIS — M5137 Other intervertebral disc degeneration, lumbosacral region: Secondary | ICD-10-CM | POA: Insufficient documentation

## 2014-01-18 LAB — BASIC METABOLIC PANEL
BUN: 14 mg/dL (ref 6–23)
CO2: 20 mEq/L (ref 19–32)
Calcium: 9.6 mg/dL (ref 8.4–10.5)
Chloride: 103 mEq/L (ref 96–112)
Creat: 1.03 mg/dL (ref 0.50–1.35)
Glucose, Bld: 480 mg/dL — ABNORMAL HIGH (ref 70–99)
Potassium: 4.5 mEq/L (ref 3.5–5.3)
Sodium: 135 mEq/L (ref 135–145)

## 2014-01-18 NOTE — Assessment & Plan Note (Signed)
Acute non displaced fracture noted on x-ray Plan; buddy taping to next toe.

## 2014-01-18 NOTE — Telephone Encounter (Signed)
Called patient Left message Asked for call back  1. Toe fracture: buddy taping needed  2. Back x-ray shows chronic arthritis.. We can discuss treatment options at f/u.

## 2014-01-18 NOTE — Assessment & Plan Note (Signed)
HIV negative

## 2014-01-22 ENCOUNTER — Telehealth: Payer: Self-pay | Admitting: Family Medicine

## 2014-01-22 ENCOUNTER — Telehealth: Payer: Self-pay | Admitting: *Deleted

## 2014-01-22 NOTE — Telephone Encounter (Signed)
Message copied by Dyann Kief on Mon Jan 22, 2014  3:24 PM ------      Message from: Dessa Phi      Created: Fri Jan 19, 2014  9:11 AM       Normal BMP except elevated blood sugar70 year old male ------

## 2014-01-22 NOTE — Telephone Encounter (Signed)
Left voice message to return call 

## 2014-01-22 NOTE — Telephone Encounter (Signed)
Pt returning nurse call. Please contact pt.

## 2014-01-23 NOTE — Telephone Encounter (Signed)
Left message on patient's VM to return call to discuss lab results. 

## 2014-01-31 ENCOUNTER — Ambulatory Visit (HOSPITAL_COMMUNITY)
Admission: RE | Admit: 2014-01-31 | Discharge: 2014-01-31 | Disposition: A | Discharge status: Home / Self Care | Payer: No Typology Code available for payment source | Source: Ambulatory Visit | Attending: Family Medicine | Admitting: Family Medicine

## 2014-01-31 ENCOUNTER — Encounter: Payer: Self-pay | Admitting: Family Medicine

## 2014-01-31 ENCOUNTER — Ambulatory Visit: Payer: No Typology Code available for payment source | Attending: Family Medicine | Admitting: Family Medicine

## 2014-01-31 VITALS — BP 133/90 | HR 87 | Temp 98.5°F | Resp 18 | Ht 71.0 in | Wt 188.8 lb

## 2014-01-31 DIAGNOSIS — M25519 Pain in unspecified shoulder: Secondary | ICD-10-CM | POA: Insufficient documentation

## 2014-01-31 DIAGNOSIS — E1165 Type 2 diabetes mellitus with hyperglycemia: Secondary | ICD-10-CM

## 2014-01-31 DIAGNOSIS — M545 Low back pain, unspecified: Secondary | ICD-10-CM

## 2014-01-31 DIAGNOSIS — E1149 Type 2 diabetes mellitus with other diabetic neurological complication: Secondary | ICD-10-CM

## 2014-01-31 DIAGNOSIS — M25511 Pain in right shoulder: Secondary | ICD-10-CM

## 2014-01-31 DIAGNOSIS — M549 Dorsalgia, unspecified: Secondary | ICD-10-CM | POA: Insufficient documentation

## 2014-01-31 DIAGNOSIS — G8929 Other chronic pain: Secondary | ICD-10-CM

## 2014-01-31 DIAGNOSIS — R739 Hyperglycemia, unspecified: Secondary | ICD-10-CM

## 2014-01-31 DIAGNOSIS — R7309 Other abnormal glucose: Secondary | ICD-10-CM

## 2014-01-31 DIAGNOSIS — M542 Cervicalgia: Secondary | ICD-10-CM | POA: Insufficient documentation

## 2014-01-31 DIAGNOSIS — E1140 Type 2 diabetes mellitus with diabetic neuropathy, unspecified: Secondary | ICD-10-CM

## 2014-01-31 DIAGNOSIS — Z7982 Long term (current) use of aspirin: Secondary | ICD-10-CM | POA: Insufficient documentation

## 2014-01-31 DIAGNOSIS — E119 Type 2 diabetes mellitus without complications: Secondary | ICD-10-CM | POA: Insufficient documentation

## 2014-01-31 DIAGNOSIS — IMO0002 Reserved for concepts with insufficient information to code with codable children: Secondary | ICD-10-CM

## 2014-01-31 DIAGNOSIS — E1142 Type 2 diabetes mellitus with diabetic polyneuropathy: Secondary | ICD-10-CM | POA: Insufficient documentation

## 2014-01-31 LAB — GLUCOSE, POCT (MANUAL RESULT ENTRY): POC Glucose: 270 mg/dl — AB (ref 70–99)

## 2014-01-31 MED ORDER — LISINOPRIL 20 MG PO TABS: 20.0000 mg | ORAL_TABLET | Freq: Every day | ORAL | Status: DC

## 2014-01-31 MED ORDER — MELOXICAM 15 MG PO TABS: 15.0000 mg | ORAL_TABLET | Freq: Every day | ORAL | Status: DC

## 2014-01-31 MED ORDER — INSULIN PEN NEEDLE 31G X 8 MM MISC: 1.0000 "application " | Freq: Every day | Status: DC

## 2014-01-31 MED ORDER — INSULIN GLARGINE 300 UNIT/ML ~~LOC~~ SOPN: 10.0000 [IU] | PEN_INJECTOR | SUBCUTANEOUS | Status: DC

## 2014-01-31 MED ORDER — DAPAGLIFLOZIN PROPANEDIOL 10 MG PO TABS: 10.0000 mg | ORAL_TABLET | Freq: Every day | ORAL | Status: DC

## 2014-01-31 MED ORDER — CYCLOBENZAPRINE HCL 10 MG PO TABS
10.0000 mg | ORAL_TABLET | Freq: Two times a day (BID) | ORAL | Status: DC | PRN
Start: 2014-01-31 — End: 2014-03-22

## 2014-01-31 MED ORDER — METFORMIN HCL ER 500 MG PO TB24: 1000.0000 mg | ORAL_TABLET | Freq: Two times a day (BID) | ORAL | Status: DC

## 2014-01-31 NOTE — Progress Notes (Signed)
F/U DM Complain of neck pain . Has question about pain managmant

## 2014-01-31 NOTE — Assessment & Plan Note (Signed)
A: suspect referred pain from R shoulder P: NSAID, muscle relaxer, PT Imaging of cervical spine because of spinous process tenderness on exam

## 2014-01-31 NOTE — Assessment & Plan Note (Signed)
A: improving towards goal, not at goal. No acute illness P: Continue metformin start taking two pills once daily for this week, next week take two pills twice daily STOP glipizide Continue farxiga increase to 10 mg daily  Start toujeo 10 U once daily in the morning, go up by 1 Unit every day fasting blood sugar is greater than 130.

## 2014-01-31 NOTE — Progress Notes (Signed)
   Subjective:    Patient ID: [name] [name], male    DOB: 1961-03-27, 53 y.o.   MRN: [id number] CC: f/u DM2, neck pain and shoulder pain HPI   1. Neck pain: x one year. Bilateral. Upper back. No weakness in arms or hands, no tingling or numbness. Taking aleve 880 mg q AM for pain.   2 R shoulder pain: off and on since age 14. No injury. No overuse. No weakness in hands. No tingling down arms. Taking aleve 880 mg q AM for pain.    2. DM2: complaint with medications. No low CBGs. CBG range 200-300. 230 this AM when he checked at home. No CP, SOB, GI upset.   Soc Hx: chronic non smoker  Review of Systems As per HPI     Objective:   Physical Exam BP 133/90  Pulse 87  Temp(Src) 98.5 F (36.9 C) (Oral)  Resp 18  Ht  (1.803 m)  Wt 188 lb 12.8 oz (85.639 kg)  BMI 26.34 kg/m2  SpO2 97% General appearance: alert, cooperative and no distress Lungs: clear to auscultation bilaterally Heart: regular rate and rhythm, S1, S2 normal, no murmur, click, rub or gallop Neck: full ROM, tenderness paraspinal and spinous process C5-C6, R Shoulder: Inspection reveals no abnormalities, atrophy or asymmetry. Palpation with tenderness over deltoid, AC joint and bicipital groove. ROM is full in all planes. Rotator cuff strength normal throughout. Positive empty can and [place] Painful arc Negative drop arm      Assessment & Plan:

## 2014-01-31 NOTE — Patient Instructions (Addendum)
Mr. Palau,  Thank you for coming in today.  1. For diabetes: Continue metformin start taking two pills once daily for this week, next week take two pills twice daily STOP glipizide Continue farxiga increase to 10 mg daily  Start toujeo 10 U once daily in the morning, go up by 1 Unit every day fasting blood sugar is greater than 130.   2. Neck and shoulder pain: X-rays of shoulder and neck  mobic instead of aleve Flexeril is a muscle relaxer for spasm.   Return in 3 weeks for nurse visit with blood sugar log for review. See me in 6 weeks.   Dr. Armen Pickup

## 2014-01-31 NOTE — Assessment & Plan Note (Signed)
A: suspect rotator cuff injury with inflammation of the supraspinatus based on exam. Patient resistant to intraarticular injection today. P:  X-ray NSAID, mobic muslcer relaxer PT

## 2014-02-05 NOTE — Telephone Encounter (Signed)
Message copied by Dyann Kief on Mon Feb 05, 2014  2:37 PM ------      Message from: Dessa Phi      Created: Thu Feb 01, 2014  3:25 PM       Normal R shoulder x-ray ------

## 2014-02-05 NOTE — Telephone Encounter (Signed)
Left message with normal Xray results

## 2014-02-21 ENCOUNTER — Other Ambulatory Visit: Payer: No Typology Code available for payment source

## 2014-02-22 ENCOUNTER — Encounter: Payer: Self-pay | Admitting: *Deleted

## 2014-02-22 ENCOUNTER — Encounter: Payer: No Typology Code available for payment source | Attending: Family Medicine | Admitting: *Deleted

## 2014-02-22 VITALS — Ht 71.0 in | Wt 191.7 lb

## 2014-02-22 DIAGNOSIS — IMO0002 Reserved for concepts with insufficient information to code with codable children: Secondary | ICD-10-CM

## 2014-02-22 DIAGNOSIS — E1165 Type 2 diabetes mellitus with hyperglycemia: Secondary | ICD-10-CM | POA: Insufficient documentation

## 2014-02-22 DIAGNOSIS — Z713 Dietary counseling and surveillance: Secondary | ICD-10-CM | POA: Insufficient documentation

## 2014-02-22 DIAGNOSIS — Z794 Long term (current) use of insulin: Secondary | ICD-10-CM | POA: Insufficient documentation

## 2014-02-22 DIAGNOSIS — E1140 Type 2 diabetes mellitus with diabetic neuropathy, unspecified: Secondary | ICD-10-CM | POA: Insufficient documentation

## 2014-02-22 NOTE — Patient Instructions (Signed)
Plan:  Aim for 3 Carb Choices per meal (45 grams) +/- 1 either way  Aim for 0-2 Carbs per snack if hungry  Include protein in moderation with your meals and snacks Consider reading food labels for Total Carbohydrate of foods Continue with your current activity daily as tolerated Consider checking BG at alternate times per day  Continue taking medication as directed by MD

## 2014-02-25 NOTE — Progress Notes (Signed)
Diabetes Self-Management Education  Visit Type:  Initial  Appt. Start Time: 1530 Appt. End Time: 1700  02/25/2014  [name], identified by name and date of birth, is a 53 y.o. male with a diagnosis of Diabetes: Type 2.  Other people present during visit:  Patient   ASSESSMENT  Height 5\' 11"  (1.803 m), weight 191 lb 11.2 oz (86.955 kg). Body mass index is 26.75 kg/(m^2).  Initial Visit Information:  Are you currently following a meal plan?: No   Are you taking your medications as prescribed?: Yes Are you checking your feet?: Yes How many days per week are you checking your feet?: 7 How often do you need to have someone help you when you read instructions, pamphlets, or other written materials from your doctor or pharmacy?: 1 - Never    Psychosocial:     Self-management support: Doctor's office;Internet communities Other persons present: Patient Patient Concerns: Nutrition/Meal planning Special Needs: None Preferred Learning Style: Auditory  Complications:   Last HgB A1C per patient/outside source: 13.7 mg/dL How often do you check your blood sugar?: 1-2 times/day Fasting Blood glucose range (mg/dL): 161-096;>045180-200;>200 Number of hypoglycemic episodes per month: 0 Have you had a dilated eye exam in the past 12 months?: No Have you had a dental exam in the past 12 months?: No  Diet Intake:  Breakfast: 2 toast with butter OR gravy biscuit OR sausage biscuit OR bacon and 2 eggs OR infrequently sweetened cereal with 2% milk, OJ or Fruit Punch or diet soda Snack (morning): not usually Lunch: at home: sandwich with chips, diet soda Snack (afternoon): not usually Dinner: meat, instant mashed potatoes, vegetables, occasionally a salad, diet soda Snack (evening): occasionally 2 scoops ice cream Beverage(s): OJ, or Fruit Punch or diet soda, very cold water  Exercise:  Exercise: ADL's (some house work or yard work for 1-2 hours each day)  Individualized Plan for  Diabetes Self-Management Training:   Learning Objective:  Patient will have a greater understanding of diabetes self-management.  Patient education plan per assessed needs and concerns is to attend individual sessions for     Education Topics Reviewed with Patient Today:  Definition of diabetes, type 1 and 2, and the diagnosis of diabetes Role of diet in the treatment of diabetes and the relationship between the three main macronutrients and blood glucose level;Food label reading, portion sizes and measuring food.;Carbohydrate counting Role of exercise on diabetes management, blood pressure control and cardiac health. Reviewed patients medication for diabetes, action, purpose, timing of dose and side effects. Identified appropriate SMBG and/or A1C goals. Taught treatment of hypoglycemia - the 15 rule. Lipid levels, blood glucose control and heart disease Role of stress on diabetes      PATIENTS GOALS/Plan (Developed by the patient):  Nutrition: Follow meal plan discussed Physical Activity: 15 minutes per day Medications: take my medication as prescribed Monitoring : test blood glucose pre and post meals as discussed  Plan:   Patient Instructions  Plan:  Aim for 3 Carb Choices per meal (45 grams) +/- 1 either way  Aim for 0-2 Carbs per snack if hungry  Include protein in moderation with your meals and snacks Consider reading food labels for Total Carbohydrate of foods Continue with your current activity daily as tolerated Consider checking BG at alternate times per day  Continue taking medication as directed by MD       Expected Outcomes:  Demonstrated interest in learning. Expect positive outcomes  Education material provided: Living Well with Diabetes,  Food label handouts, A1C conversion sheet, Meal plan card and Carbohydrate counting sheet, Diabetes Medication List, Insulin Action handout  If problems or questions, patient to contact team via:  Email  Future DSME  appointment: PRN

## 2014-03-20 ENCOUNTER — Other Ambulatory Visit: Payer: Self-pay | Admitting: Family Medicine

## 2014-03-22 ENCOUNTER — Other Ambulatory Visit: Payer: Self-pay | Admitting: Family Medicine

## 2014-03-26 ENCOUNTER — Encounter: Payer: Self-pay | Admitting: Family Medicine

## 2014-03-26 ENCOUNTER — Ambulatory Visit: Payer: Self-pay | Attending: Family Medicine | Admitting: Family Medicine

## 2014-03-26 ENCOUNTER — Ambulatory Visit: Payer: Self-pay

## 2014-03-26 VITALS — BP 145/92 | HR 83 | Temp 98.4°F | Resp 18 | Ht 71.0 in | Wt 194.0 lb

## 2014-03-26 DIAGNOSIS — E1169 Type 2 diabetes mellitus with other specified complication: Secondary | ICD-10-CM

## 2014-03-26 DIAGNOSIS — E1140 Type 2 diabetes mellitus with diabetic neuropathy, unspecified: Secondary | ICD-10-CM

## 2014-03-26 DIAGNOSIS — M2662 Arthralgia of temporomandibular joint: Secondary | ICD-10-CM | POA: Insufficient documentation

## 2014-03-26 DIAGNOSIS — I10 Essential (primary) hypertension: Secondary | ICD-10-CM

## 2014-03-26 DIAGNOSIS — M549 Dorsalgia, unspecified: Secondary | ICD-10-CM

## 2014-03-26 DIAGNOSIS — I152 Hypertension secondary to endocrine disorders: Secondary | ICD-10-CM

## 2014-03-26 DIAGNOSIS — E1159 Type 2 diabetes mellitus with other circulatory complications: Secondary | ICD-10-CM

## 2014-03-26 DIAGNOSIS — M2660 Temporomandibular joint disorder, unspecified: Secondary | ICD-10-CM

## 2014-03-26 DIAGNOSIS — M25511 Pain in right shoulder: Secondary | ICD-10-CM

## 2014-03-26 DIAGNOSIS — M26609 Unspecified temporomandibular joint disorder, unspecified side: Secondary | ICD-10-CM

## 2014-03-26 DIAGNOSIS — IMO0002 Reserved for concepts with insufficient information to code with codable children: Secondary | ICD-10-CM

## 2014-03-26 DIAGNOSIS — E1165 Type 2 diabetes mellitus with hyperglycemia: Secondary | ICD-10-CM

## 2014-03-26 DIAGNOSIS — Z794 Long term (current) use of insulin: Secondary | ICD-10-CM | POA: Insufficient documentation

## 2014-03-26 DIAGNOSIS — M546 Pain in thoracic spine: Secondary | ICD-10-CM

## 2014-03-26 LAB — BASIC METABOLIC PANEL
BUN: 12 mg/dL (ref 6–23)
CO2: 22 mEq/L (ref 19–32)
Calcium: 10.4 mg/dL (ref 8.4–10.5)
Chloride: 99 mEq/L (ref 96–112)
Creat: 0.8 mg/dL (ref 0.50–1.35)
Glucose, Bld: 434 mg/dL — ABNORMAL HIGH (ref 70–99)
Potassium: 4.3 mEq/L (ref 3.5–5.3)
Sodium: 134 mEq/L — ABNORMAL LOW (ref 135–145)

## 2014-03-26 LAB — GLUCOSE, POCT (MANUAL RESULT ENTRY)
POC Glucose: 528 mg/dl — AB (ref 70–99)
POC Glucose: 482 mg/dl — AB (ref 70–99)

## 2014-03-26 LAB — POCT GLYCOSYLATED HEMOGLOBIN (HGB A1C): Hemoglobin A1C: 11.1

## 2014-03-26 MED ORDER — LISINOPRIL 40 MG PO TABS: 40.0000 mg | ORAL_TABLET | Freq: Every day | ORAL | Status: DC

## 2014-03-26 MED ORDER — GABAPENTIN 300 MG PO CAPS: 300.0000 mg | ORAL_CAPSULE | Freq: Three times a day (TID) | ORAL | Status: DC

## 2014-03-26 MED ORDER — INSULIN GLARGINE 300 UNIT/ML ~~LOC~~ SOPN: 20.0000 [IU] | PEN_INJECTOR | SUBCUTANEOUS | Status: DC

## 2014-03-26 MED ORDER — INSULIN PEN NEEDLE 32G X 4 MM MISC: 1.0000 | Freq: Every day | Status: DC

## 2014-03-26 MED ORDER — INSULIN ASPART 100 UNIT/ML ~~LOC~~ SOLN
10.0000 [IU] | Freq: Once | SUBCUTANEOUS | Status: AC
  Administered 2014-03-26: 10 [IU] via SUBCUTANEOUS

## 2014-03-26 NOTE — Assessment & Plan Note (Signed)
A: chronic P: Avoid citrus Referral to dental

## 2014-03-26 NOTE — Assessment & Plan Note (Signed)
A: suspect rotator cuff P: Referral to sports medicine Increase gabapentin dose

## 2014-03-26 NOTE — Progress Notes (Signed)
   Subjective:    Patient ID: [name] [name], male    DOB: 02/11/1961, 53 y.o.   MRN: [id number] CC: diabetes f/u  HPI 53  Yo M present for diabetes f/u:  1. CHRONIC DIABETES  Disease Monitoring  Blood Sugar Ranges: 200-250  Polyuria: no   Visual problems: no   Medication Compliance: yes  Medication Side Effects  Hypoglycemia: no   Preventitive Health Care  Eye Exam: due   Foot Exam: done last visit   Diet pattern: 3 meals, sugary sweetened sugars  Exercise: minimal, chronic low back pain   2. TMJ: x 2-3 years. B/l jaw pain. Pain is worse when he eats citrus foods/drinks. No jaw locking. Plenty of popping. Last dentist visit was 10 years ago.  3. R shoulder pain: worse with lifting, throwing, shoulder abduction. Taking gabapentin which helps with feet.   Soc hx:  Non smoker  Review of Systems As per HPI  GAD-7: score of 16. 1-5. 2-14,7. 3-2,3,6.     Objective:   Physical Exam BP 152/96 mmHg  Pulse 83  Temp(Src) 98.4 F (36.9 C) (Oral)  Resp 18  Ht 5\' 11"  (1.803 m)  Wt 194 lb (87.998 kg)  BMI 27.07 kg/m2  SpO2 97%  Wt Readings from Last 3 Encounters:  03/26/14 194 lb (87.998 kg)  02/22/14 191 lb 11.2 oz (86.955 kg)  01/31/14 188 lb 12.8 oz (85.639 kg)  General appearance: alert, cooperative and no distress Lungs: clear to auscultation bilaterally Heart: regular rate and rhythm, S1, S2 normal, no murmur, click, rub or gallop Extremities: extremities normal, atraumatic, no cyanosis or edema  CBG 528 patient given 10 U of novolog, CBG 482. Took toujeo 10  U this AM.   Lab Results  Component Value Date   HGBA1C 13.7 01/04/2014   Lab Results  Component Value Date   HGBA1C 11.1 03/26/2014        Assessment & Plan:

## 2014-03-26 NOTE — Assessment & Plan Note (Signed)
A: improving A1c P: Increase toujeo to 20 U daily  Continue metformin and januvia  Increase exercise, recommended water aerobics Will refer to diabetes education to address diet.

## 2014-03-26 NOTE — Progress Notes (Signed)
F/U DM and TMJ  Requested X-Ray results Stated glucose running under 200 since started Insulin

## 2014-03-26 NOTE — Patient Instructions (Addendum)
[name],  Thank you for coming back in to see me today.  1. Diabetes: Heading in the right direction! Well done. Increase Toujeo to 20 U daily. Avoid sugary foods. Changed insulin needles to 4 mm Regular exercise-explore the [employer], access to a pool  2. TMJ- Dental referral Avoid citrus  We do not carry magic mouthwash, orajel antiseptic rinse is a good alternative, it's about $6.  3. R shoulder pain- suspect rotator cuff injury- referral to sports medicine Increase gabapentin to 300 mg three times daily, start with twice daily for one week, then three times daily.  4. HTN: BP elevated. Checking creatinine on BMP  Increase lisinopril to 40 mg daily   F/u in 2 months  Dr. Armen PickupFunches

## 2014-03-26 NOTE — Assessment & Plan Note (Signed)
A: BP elevated P: Increase lisinopril from 20 mg to 40 mg daily Check BMP, Cr

## 2014-03-28 ENCOUNTER — Telehealth: Payer: Self-pay | Admitting: *Deleted

## 2014-03-28 NOTE — Telephone Encounter (Signed)
-----   Message from Lora PaulaJosalyn C Funches, MD sent at 03/27/2014  9:18 AM EST ----- Stable Cr on BMP. Continue current medication regimen

## 2014-03-28 NOTE — Telephone Encounter (Signed)
Left voice message with results Advice to take medication as prescribe If any question return call

## 2014-04-11 ENCOUNTER — Other Ambulatory Visit: Payer: Self-pay | Admitting: Internal Medicine

## 2014-04-11 DIAGNOSIS — E1159 Type 2 diabetes mellitus with other circulatory complications: Secondary | ICD-10-CM

## 2014-04-11 DIAGNOSIS — I152 Hypertension secondary to endocrine disorders: Secondary | ICD-10-CM

## 2014-04-11 DIAGNOSIS — I10 Essential (primary) hypertension: Principal | ICD-10-CM

## 2014-04-11 MED ORDER — INSULIN GLARGINE 300 UNIT/ML ~~LOC~~ SOPN: 20.0000 [IU] | PEN_INJECTOR | SUBCUTANEOUS | Status: DC

## 2014-04-16 ENCOUNTER — Encounter: Payer: Self-pay | Admitting: Sports Medicine

## 2014-04-16 ENCOUNTER — Ambulatory Visit (INDEPENDENT_AMBULATORY_CARE_PROVIDER_SITE_OTHER): Payer: Self-pay | Admitting: Sports Medicine

## 2014-04-16 VITALS — BP 122/82 | HR 82 | Ht 71.0 in | Wt 194.0 lb

## 2014-04-16 DIAGNOSIS — M25511 Pain in right shoulder: Secondary | ICD-10-CM

## 2014-04-16 MED ORDER — METHYLPREDNISOLONE ACETATE 40 MG/ML IJ SUSP
40.0000 mg | Freq: Once | INTRAMUSCULAR | Status: AC
  Administered 2014-04-16: 40 mg via INTRA_ARTICULAR

## 2014-04-16 NOTE — Progress Notes (Addendum)
[name] [name] 01/12/1961 53 y.o. [id number]  Subjective:  Patient presents to sports medicine clinic today for initial evaluation of right shoulder pain. Patient states that his shoulder started hurting approximately one year ago, the pain is intermittent. He points to the posterior inferior aspect of her shoulder as the location of pain. He states he's had no trauma to this area that he knows that occurred approximately one year ago. He had a shoulder injury when he was a young child while playing baseball to that shoulder. He denies any redness or swelling when pain is present. He states the pain is sharp in nature and he takes four alieve to help it go away, as well as heat and ice, and it usually just takes time for it to resolve. He states when it first occurs it feels like something is "out of socket". He states the pain is worse when he is throwing or picking up heavy objects. He plays no sports and is not physically active, he is currently unemployed. Before unemployment he did work as a Production designer, theatre/television/filmmanager at an [employer], he also works and Education [employer] in his spare time. He is right-hand dominant.  Never smoker  Past Medical History  Diagnosis Date  . Coronary artery disease 03/2004     3 stents put in   . Apnea, sleep   . Diabetes mellitus 2010    never on insulin   . Myocardial infarction 2005   No Known Allergies  ROS : per HPI  Objective: BP 122/82 mmHg  Pulse 82  Ht 5\' 11"  (1.803 m)  Wt 194 lb (87.998 kg)  BMI 27.07 kg/m2        Gen: Pleasant, Caucasian male, no acute distress, nontoxic appearance, well-developed, well-nourished, mildly overweight, awake, alert and oriented 3 Right shoulder: No erythema. No swelling, no effusions. Tenderness to palpation over bicep tendon groove and near infraspinatus. Decreased range of motion with abduction approximately 120. Pain with internal rotation. Muscle strength 5 out of 5 flexion, extension, external rotation, internal  rotation, and adduction. Mild weakness with abduction.  Positive O'Brien's, positive nears, pain with empty can test, near infraspinatus. Negative liftoff test. Neurovascularly  intact distally.  Recent films of shoulder September 2015 reviewed, and normal.  Assessment/plan: [name] Arguehomas D Musso is a 53 y.o. with right shoulder pain, possibly due to labral tear. We agreed upon a  joint injection today.  Patient is in understanding, and agrees upon joint injection today. Risks and benefits were explained including risk of transient hyperglycemia given his diabetes. - Patient is to follow-up in 3-4 weeks. If symptoms persist consider further diagnostic imaging.   Consent obtained and verified. Time out conducted Noted no overlying erythema, induration or signs of local infection Skin prepped in a sterile fashion. Topical analgesic spray: Ethyl chloride. Joint: right shoulder ( intra-articular) Approached in typical fashion with:  Completed without difficulty Meds: 3 cc of 1% Xylocaine, 1 cc of 40 mg Depo-Medrol Needle: 22-gauge 1/2 inch Aftercare instructions and Red flags advised.- Advised to call if fevers/chills, erythema, induration, drainage, or persistent bleeding

## 2014-05-14 ENCOUNTER — Encounter: Payer: Self-pay | Admitting: Sports Medicine

## 2014-05-14 ENCOUNTER — Ambulatory Visit (INDEPENDENT_AMBULATORY_CARE_PROVIDER_SITE_OTHER): Payer: Self-pay | Admitting: Sports Medicine

## 2014-05-14 VITALS — BP 148/98 | HR 76 | Ht 71.0 in | Wt 190.0 lb

## 2014-05-14 DIAGNOSIS — M25511 Pain in right shoulder: Secondary | ICD-10-CM

## 2014-05-14 NOTE — Progress Notes (Signed)
   Subjective:    Patient ID: [name] [name], male    DOB: Feb 19, 1961, 54 y.o.   MRN: [id number]  HPI   Patient comes in today to follow-up on right shoulder pain. Patient's pain has improved by about 70% after a recent intra-articular injection. Still getting some catching and popping in the shoulder however but it is not as bad as it was.    Review of Systems     Objective:   Physical Exam Well-developed, well-nourished. No acute distress. Awake alert and oriented 3. Vital signs reviewed  Right shoulder: Patient has forward flexion actively and abduction actively to about 130. Still getting some painful catching and popping in the shoulder with active and passive range of motion. Rotator cuff strength remains 5 out of 5. Still with a positive O'Brien's. Positive clunk. Neurovascularly intact distally.      Assessment & Plan:  Improved right shoulder pain likely secondary to labral tear  Given the patient's improvement I'm going to hold on further workup and treatment. Patient is currently without insurance but is in the process of trying to get Medicaid. I explained to him that if his symptoms once again begin to worsen then we should reconsider merits of an MRI scan to rule out a large labral tear. However, the only reason in my opinion to do this would be to proceed with surgical intervention and if the patient is without insurance then this may be unnecessary. Follow-up when necessary.

## 2014-06-14 ENCOUNTER — Telehealth: Payer: Self-pay | Admitting: *Deleted

## 2014-06-14 NOTE — Telephone Encounter (Signed)
Pt states he had injection in rt shoulder x 6 weeks ago and pain is back completely. He doesn't have money to come back for a f/u but wants it noted that the injection only helped for 6 weeks

## 2014-07-05 ENCOUNTER — Encounter: Payer: Self-pay | Admitting: Family Medicine

## 2014-07-05 ENCOUNTER — Ambulatory Visit: Payer: Self-pay | Attending: Family Medicine | Admitting: Family Medicine

## 2014-07-05 VITALS — BP 131/99 | HR 105 | Temp 98.6°F | Resp 16 | Ht 71.0 in | Wt 201.0 lb

## 2014-07-05 DIAGNOSIS — IMO0002 Reserved for concepts with insufficient information to code with codable children: Secondary | ICD-10-CM

## 2014-07-05 DIAGNOSIS — Z7982 Long term (current) use of aspirin: Secondary | ICD-10-CM | POA: Insufficient documentation

## 2014-07-05 DIAGNOSIS — F329 Major depressive disorder, single episode, unspecified: Secondary | ICD-10-CM | POA: Insufficient documentation

## 2014-07-05 DIAGNOSIS — G8929 Other chronic pain: Secondary | ICD-10-CM | POA: Insufficient documentation

## 2014-07-05 DIAGNOSIS — M545 Low back pain, unspecified: Secondary | ICD-10-CM

## 2014-07-05 DIAGNOSIS — E1140 Type 2 diabetes mellitus with diabetic neuropathy, unspecified: Secondary | ICD-10-CM | POA: Insufficient documentation

## 2014-07-05 DIAGNOSIS — F32A Depression, unspecified: Secondary | ICD-10-CM

## 2014-07-05 DIAGNOSIS — I152 Hypertension secondary to endocrine disorders: Secondary | ICD-10-CM

## 2014-07-05 DIAGNOSIS — E1165 Type 2 diabetes mellitus with hyperglycemia: Secondary | ICD-10-CM

## 2014-07-05 DIAGNOSIS — I10 Essential (primary) hypertension: Secondary | ICD-10-CM | POA: Insufficient documentation

## 2014-07-05 DIAGNOSIS — E1159 Type 2 diabetes mellitus with other circulatory complications: Secondary | ICD-10-CM

## 2014-07-05 DIAGNOSIS — E1169 Type 2 diabetes mellitus with other specified complication: Secondary | ICD-10-CM

## 2014-07-05 LAB — GLUCOSE, POCT (MANUAL RESULT ENTRY): POC Glucose: 226 mg/dl — AB (ref 70–99)

## 2014-07-05 LAB — POCT GLYCOSYLATED HEMOGLOBIN (HGB A1C): Hemoglobin A1C: 10.3

## 2014-07-05 MED ORDER — SITAGLIPTIN PHOSPHATE 100 MG PO TABS: 100.0000 mg | ORAL_TABLET | Freq: Every day | ORAL | Status: DC

## 2014-07-05 MED ORDER — DICLOFENAC SODIUM 75 MG PO TBEC: 75.0000 mg | DELAYED_RELEASE_TABLET | Freq: Two times a day (BID) | ORAL | Status: DC

## 2014-07-05 MED ORDER — INSULIN GLARGINE 300 UNIT/ML ~~LOC~~ SOPN: 40.0000 [IU] | PEN_INJECTOR | SUBCUTANEOUS | Status: DC

## 2014-07-05 MED ORDER — GABAPENTIN 300 MG PO CAPS: 600.0000 mg | ORAL_CAPSULE | Freq: Three times a day (TID) | ORAL | Status: DC

## 2014-07-05 MED ORDER — ACETAMINOPHEN-CODEINE #3 300-30 MG PO TABS: 1.0000 | ORAL_TABLET | Freq: Two times a day (BID) | ORAL | Status: DC | PRN

## 2014-07-05 MED ORDER — METOPROLOL SUCCINATE ER 25 MG PO TB24: 25.0000 mg | ORAL_TABLET | Freq: Every day | ORAL | Status: DC

## 2014-07-05 MED ORDER — GLUCOSE BLOOD VI STRP: 1.0000 | ORAL_STRIP | Freq: Three times a day (TID) | Status: DC

## 2014-07-05 MED ORDER — METHOCARBAMOL 500 MG PO TABS: 500.0000 mg | ORAL_TABLET | Freq: Three times a day (TID) | ORAL | Status: DC | PRN

## 2014-07-05 NOTE — Progress Notes (Signed)
Patient here to follow up on his DM and HTN Patient states severe pain that is unbearable Patient has not been able to afford his test strips Patient is visibly shaken and crying and states "if I wasn't here anymore I wouldn't have to deal with this stuff"

## 2014-07-05 NOTE — Patient Instructions (Signed)
[name],  Thank you for coming in today.  1. HTN: Continue lisinopril 40 mg daily Add toprol XL 25 mg daily with plan to taper up dose as needed to goal BP Goal BP < 140/90  2. Diabetes: Improving Increase toujeo to 40 mg once daily Continue farxiga 10 mg daily Add januvia 100 mg daily Refilled test strips   3. Chronic pain: Increase gabapentin to 600 mg three times daily Muscle relaxer: robaxin Antiinflammatory: diclofenac (no more mobic or aleve)  Pain medicine: tylenol #3   4. Depression: continue with mental health.  [facility] emergency [facility] or suicide hotline if you think you may hurt yourself  F/u with nurse in 3 weeks for BP check F/u with me in 3 months for diabetes, HTN  Dr. Armen PickupFunches

## 2014-07-06 NOTE — Assessment & Plan Note (Signed)
1. HTN: Continue lisinopril 40 mg daily Add toprol XL 25 mg daily with plan to taper up dose as needed to goal BP Goal BP < 140/90

## 2014-07-06 NOTE — Assessment & Plan Note (Signed)
  4. Depression: continue with mental health.  Se[facility] emergency [facility] or suicide hotline if you think you may hurt yourself

## 2014-07-06 NOTE — Assessment & Plan Note (Signed)
  3. Chronic pain: Increase gabapentin to 600 mg three times daily Muscle relaxer: robaxin Antiinflammatory: diclofenac (no more mobic or aleve)  Pain medicine: tylenol #3

## 2014-07-06 NOTE — Assessment & Plan Note (Signed)
Diabetes: Improving Increase toujeo to 40 mg once daily Continue farxiga 10 mg daily Add januvia 100 mg daily Refilled test strips

## 2014-07-06 NOTE — Progress Notes (Signed)
   Subjective:    Patient ID: [name] [name], male    DOB: 06/20/1960, 54 y.o.   MRN: [id number] CC: f/u DM, HTN, very depressed,  HPI 54 yo M f/u:  1. CHRONIC DIABETES  Disease Monitoring  Blood Sugar Ranges: not checking   Polyuria: no   Visual problems: no   Medication Compliance: yes  Medication Side Effects  Hypoglycemia: no   2. CHRONIC HYPERTENSION  Disease Monitoring  Blood pressure range: not checking   Chest pain: no   Dyspnea: no   Claudication: no   Medication compliance: yes  Medication Side Effects  Lightheadedness: no   Urinary frequency: no   Edema: no    3. Depression: feeling sad and depressed by his lack of income and medical problems. Has SI. No suicidal attempts. Established with mental health.   Soc Hx: non smoker  Review of Systems As per HPI     Objective:   Physical Exam BP 131/99 mmHg  Pulse 105  Temp(Src) 98.6 F (37 C)  Resp 16  Ht 5\' 11"  (1.803 m)  Wt 201 lb (91.173 kg)  BMI 28.05 kg/m2  SpO2 97% General appearance: alert, cooperative and no distress Lungs: clear to auscultation bilaterally Heart: regular rate and rhythm, S1, S2 normal, no murmur, click, rub or gallop Extremities: extremities normal, atraumatic, no cyanosis or edema   Lab Results  Component Value Date   HGBA1C 10.30 07/05/2014  CBG 226         Assessment & Plan:

## 2014-07-16 ENCOUNTER — Other Ambulatory Visit: Payer: Self-pay | Admitting: Family Medicine

## 2014-07-16 DIAGNOSIS — E1165 Type 2 diabetes mellitus with hyperglycemia: Secondary | ICD-10-CM

## 2014-07-16 DIAGNOSIS — E1140 Type 2 diabetes mellitus with diabetic neuropathy, unspecified: Secondary | ICD-10-CM

## 2014-07-16 DIAGNOSIS — IMO0002 Reserved for concepts with insufficient information to code with codable children: Secondary | ICD-10-CM

## 2014-07-16 MED ORDER — SITAGLIPTIN PHOSPHATE 100 MG PO TABS: 100.0000 mg | ORAL_TABLET | Freq: Every day | ORAL | Status: DC

## 2014-07-16 MED ORDER — DAPAGLIFLOZIN PROPANEDIOL 10 MG PO TABS: 10.0000 mg | ORAL_TABLET | Freq: Every day | ORAL | Status: DC

## 2014-08-13 ENCOUNTER — Ambulatory Visit: Payer: Self-pay | Attending: Family Medicine | Admitting: *Deleted

## 2014-08-13 VITALS — BP 161/97 | HR 86 | Temp 97.6°F | Resp 16

## 2014-08-13 DIAGNOSIS — I10 Essential (primary) hypertension: Secondary | ICD-10-CM | POA: Insufficient documentation

## 2014-08-13 NOTE — Progress Notes (Signed)
Patient walked in asking for a bp check.  Patient has history of CAD,DM2, HTN He currently is taking lisinopril and metoprolol and reports that he did not take his medication This morning as he ran out.  He was here to pick up his Rx this am.  BP-161/97 Pulse-86 RR-15  Sp02-95 Temp-97.6  Patient was advised to refill his medication and he is welcome to walk in tomorrow morning after taking his medication to get a more accurate reading.  Patient in agreement.

## 2014-08-14 ENCOUNTER — Telehealth: Payer: Self-pay | Admitting: Family Medicine

## 2014-08-14 NOTE — Telephone Encounter (Signed)
Patient is calling to request a different prescription for sitaGLIPtin (JANUVIA) 100 MG tablet, the pharmacy ran out of medication and they need a different prescription.

## 2014-08-15 NOTE — Telephone Encounter (Signed)
Called pharmacy. They have [place] now. They will inform the patient.

## 2014-08-22 ENCOUNTER — Telehealth: Payer: Self-pay | Admitting: Family Medicine

## 2014-08-22 ENCOUNTER — Encounter: Payer: Self-pay | Admitting: Family Medicine

## 2014-08-22 NOTE — Telephone Encounter (Signed)
Please call patient. I received a disability form from his lawyer, Pamalee Leydenndrew Price, on 08/13/14. The form is  Very detailed and will require on OV to complete. In lieu of the form there was an option to write a letter. I have written a letter. It was placed in to be faxed on 08/22/14 Patient advised to call his lawyer  If the form is still needed please schedule OV.

## 2014-08-22 NOTE — Telephone Encounter (Signed)
Pt aware, advised to call lawyer and if needed schedule apt with PCP

## 2014-10-19 ENCOUNTER — Ambulatory Visit: Payer: Self-pay | Attending: Family Medicine | Admitting: Family Medicine

## 2014-10-19 ENCOUNTER — Encounter: Payer: Self-pay | Admitting: Family Medicine

## 2014-10-19 VITALS — BP 145/96 | HR 84 | Temp 98.2°F | Resp 16 | Wt 201.0 lb

## 2014-10-19 DIAGNOSIS — N63 Unspecified lump in breast: Secondary | ICD-10-CM

## 2014-10-19 DIAGNOSIS — J329 Chronic sinusitis, unspecified: Secondary | ICD-10-CM | POA: Insufficient documentation

## 2014-10-19 DIAGNOSIS — J0100 Acute maxillary sinusitis, unspecified: Secondary | ICD-10-CM

## 2014-10-19 DIAGNOSIS — E139 Other specified diabetes mellitus without complications: Secondary | ICD-10-CM

## 2014-10-19 DIAGNOSIS — N6320 Unspecified lump in the left breast, unspecified quadrant: Secondary | ICD-10-CM

## 2014-10-19 DIAGNOSIS — E1165 Type 2 diabetes mellitus with hyperglycemia: Secondary | ICD-10-CM

## 2014-10-19 DIAGNOSIS — N529 Male erectile dysfunction, unspecified: Secondary | ICD-10-CM

## 2014-10-19 DIAGNOSIS — Z794 Long term (current) use of insulin: Secondary | ICD-10-CM | POA: Insufficient documentation

## 2014-10-19 DIAGNOSIS — E1140 Type 2 diabetes mellitus with diabetic neuropathy, unspecified: Secondary | ICD-10-CM

## 2014-10-19 DIAGNOSIS — IMO0002 Reserved for concepts with insufficient information to code with codable children: Secondary | ICD-10-CM

## 2014-10-19 LAB — POCT URINALYSIS DIPSTICK
Bilirubin, UA: NEGATIVE
Blood, UA: NEGATIVE
Glucose, UA: 500
Ketones, UA: NEGATIVE
Leukocytes, UA: NEGATIVE
Nitrite, UA: NEGATIVE
Protein, UA: NEGATIVE
Spec Grav, UA: 1.025
Urobilinogen, UA: 0.2
pH, UA: 5.5

## 2014-10-19 LAB — GLUCOSE, POCT (MANUAL RESULT ENTRY)
POC Glucose: 349 mg/dl — AB (ref 70–99)
POC Glucose: 398 mg/dl — AB (ref 70–99)

## 2014-10-19 LAB — POCT GLYCOSYLATED HEMOGLOBIN (HGB A1C): Hemoglobin A1C: 8.9

## 2014-10-19 MED ORDER — INSULIN ASPART 100 UNIT/ML ~~LOC~~ SOLN
10.0000 [IU] | Freq: Once | SUBCUTANEOUS | Status: AC
  Administered 2014-10-19: 10 [IU] via SUBCUTANEOUS

## 2014-10-19 MED ORDER — AMOXICILLIN 500 MG PO CAPS: 500.0000 mg | ORAL_CAPSULE | Freq: Three times a day (TID) | ORAL | Status: DC

## 2014-10-19 MED ORDER — SILDENAFIL CITRATE 100 MG PO TABS: 50.0000 mg | ORAL_TABLET | Freq: Every day | ORAL | Status: DC | PRN

## 2014-10-19 NOTE — Assessment & Plan Note (Signed)
A; small left breast mass. Patient low risk for malignancy with no family history and chronic non smoker P: L breast mass: ? Muscle nodule. Plan to monitor. Plan on imaging if mass enlarges or fails to resolve.

## 2014-10-19 NOTE — Patient Instructions (Signed)
[name],  Thank you for coming in today  1. Diabetes: Improving Continue current regimen Avoid candy bars  2. Sinus infection: 10 day course of amoxicillin. Take 10 whole days  3. L breast mass: ? Muscle nodule. Plan to monitor. Plan on imaging if mass enlarges or fails to resolve.  4. Erectile dysfunction: rx for viagra  F/u in 6 weeks for L breast mass f/u  Dr. Armen [name]

## 2014-10-19 NOTE — Progress Notes (Signed)
   Subjective:    Patient ID: [name] [name], male    DOB: 1960-06-26, 54 y.o.   MRN: [id number] CC: sinus infection, Dm2 f/u, ED, L breast mass  HPI  1. CHRONIC DIABETES  Disease Monitoring  Blood Sugar Ranges: 160s  Polyuria: no   Visual problems: no   Medication Compliance: yes  Medication Side Effects  Hypoglycemia: no   2. Sinus pressure: x 3 weeks. No rhinorrhea. Noxious odor. No fever. No known sick contacts.   3. L breast knot: 2 weeks ago. Was tender. Not now. No nipple discharge. No fever, chills weight loss.   4. Erectile dysfunction: x one year. Trouble getting a hard erection. Has a new partner. Would like to be sexually active. Has used low dose viagra/cialis in the past with slight improvement in erection.   Soc Hx: chronic non smoker  Review of Systems  Constitutional: Positive for unexpected weight change. Negative for fever, chills and diaphoresis.  HENT: Positive for sinus pressure.   Respiratory: Negative for shortness of breath.   Cardiovascular: Negative for chest pain.       Objective:   Physical Exam BP 145/96 mmHg  Pulse 84  Temp(Src) 98.2 F (36.8 C)  Resp 16  Wt 201 lb (91.173 kg)  SpO2 100% General appearance: alert, cooperative and no distress Head: Normocephalic, without obvious abnormality, atraumatic Eyes: conjunctivae/corneas clear. PERRL, EOM's intact.  Ears: normal TM's and external ear canals both ears Nose: no discharge, turbinates pink, swollen Throat: lips, mucosa, and tongue normal; teeth and gums normal Lungs: clear to auscultation bilaterally Heart: regular rate and rhythm, S1, S2 normal, no murmur, click, rub or gallop  Chest:  L breast nodule: 2 cm subcutaneous nodule 3 mc lateral to nipple at 3 o'clock   Lab Results  Component Value Date   HGBA1C 8.90 10/19/2014    CBG 349   Treated with 10 U novolog  Repeat CBG 398 Ketones negative      Assessment & Plan:

## 2014-10-19 NOTE — Assessment & Plan Note (Signed)
Erectile dysfunction: rx for viagra

## 2014-10-19 NOTE — Assessment & Plan Note (Signed)
2. Sinus infection: 10 day course of amoxicillin. Take 10 whole days

## 2014-10-19 NOTE — Progress Notes (Signed)
Patient complains of congestion cough increased mucous Having some bad breath when he coughs really hard Complains of headache and sinus pressure Also complains of a lump to his left breast

## 2014-10-19 NOTE — Assessment & Plan Note (Signed)
Diabetes: Improving, A1c trending down despite hyperglycemia today   Continue current regimen Avoid candy bars

## 2014-11-15 ENCOUNTER — Ambulatory Visit: Payer: Self-pay | Attending: Family Medicine

## 2015-01-04 ENCOUNTER — Ambulatory Visit: Payer: Self-pay | Attending: Family Medicine | Admitting: Family Medicine

## 2015-01-04 ENCOUNTER — Encounter: Payer: Self-pay | Admitting: Family Medicine

## 2015-01-04 VITALS — BP 149/92 | HR 106 | Temp 98.1°F | Resp 16 | Ht 71.0 in | Wt 202.0 lb

## 2015-01-04 DIAGNOSIS — J310 Chronic rhinitis: Secondary | ICD-10-CM | POA: Insufficient documentation

## 2015-01-04 DIAGNOSIS — E1165 Type 2 diabetes mellitus with hyperglycemia: Secondary | ICD-10-CM | POA: Insufficient documentation

## 2015-01-04 DIAGNOSIS — M545 Low back pain, unspecified: Secondary | ICD-10-CM

## 2015-01-04 DIAGNOSIS — E1159 Type 2 diabetes mellitus with other circulatory complications: Secondary | ICD-10-CM

## 2015-01-04 DIAGNOSIS — G8929 Other chronic pain: Secondary | ICD-10-CM | POA: Insufficient documentation

## 2015-01-04 DIAGNOSIS — E1169 Type 2 diabetes mellitus with other specified complication: Secondary | ICD-10-CM

## 2015-01-04 DIAGNOSIS — E1140 Type 2 diabetes mellitus with diabetic neuropathy, unspecified: Secondary | ICD-10-CM

## 2015-01-04 DIAGNOSIS — R51 Headache: Secondary | ICD-10-CM | POA: Insufficient documentation

## 2015-01-04 DIAGNOSIS — I152 Hypertension secondary to endocrine disorders: Secondary | ICD-10-CM

## 2015-01-04 DIAGNOSIS — J329 Chronic sinusitis, unspecified: Secondary | ICD-10-CM | POA: Insufficient documentation

## 2015-01-04 DIAGNOSIS — IMO0002 Reserved for concepts with insufficient information to code with codable children: Secondary | ICD-10-CM

## 2015-01-04 DIAGNOSIS — I10 Essential (primary) hypertension: Secondary | ICD-10-CM

## 2015-01-04 LAB — POCT URINALYSIS DIPSTICK
Bilirubin, UA: NEGATIVE
Blood, UA: NEGATIVE
Glucose, UA: 500
Ketones, UA: NEGATIVE
Leukocytes, UA: NEGATIVE
Nitrite, UA: NEGATIVE
Protein, UA: NEGATIVE
Spec Grav, UA: 1.015
Urobilinogen, UA: 0.2
pH, UA: 5

## 2015-01-04 LAB — GLUCOSE, POCT (MANUAL RESULT ENTRY)
POC Glucose: 377 mg/dl — AB (ref 70–99)
POC Glucose: 440 mg/dl — AB (ref 70–99)

## 2015-01-04 MED ORDER — NAPROXEN 500 MG PO TABS: 500.0000 mg | ORAL_TABLET | Freq: Two times a day (BID) | ORAL | Status: DC

## 2015-01-04 MED ORDER — INSULIN ASPART 100 UNIT/ML ~~LOC~~ SOLN
20.0000 [IU] | Freq: Once | SUBCUTANEOUS | Status: AC
  Administered 2015-01-04: 20 [IU] via SUBCUTANEOUS

## 2015-01-04 MED ORDER — DAPAGLIFLOZIN PROPANEDIOL 10 MG PO TABS: 10.0000 mg | ORAL_TABLET | Freq: Every day | ORAL | Status: DC

## 2015-01-04 MED ORDER — KETOROLAC TROMETHAMINE 60 MG/2ML IM SOLN
60.0000 mg | Freq: Once | INTRAMUSCULAR | Status: AC
  Administered 2015-01-04: 60 mg via INTRAMUSCULAR

## 2015-01-04 MED ORDER — LEVOFLOXACIN 500 MG PO TABS: 500.0000 mg | ORAL_TABLET | Freq: Every day | ORAL | Status: DC

## 2015-01-04 MED ORDER — FLUTICASONE PROPIONATE 50 MCG/ACT NA SUSP: 2.0000 | Freq: Every day | NASAL | Status: DC

## 2015-01-04 NOTE — Assessment & Plan Note (Addendum)
Diabetes with high sugar today: Treated Add back farxiga 10 mg  Low carbs  Diabetes blood sugar goals  Fasting (in AM before breakfast, 8 hrs of no eating or drinking (except water or unsweetened coffee or tea): 90-110 2 hrs after meals: < 160,   No low sugars: nothing < 70

## 2015-01-04 NOTE — Assessment & Plan Note (Signed)
Chronic low back pain. worsenig toradol shot today MRI ordered Naproxen 500 mg twice daily  Walk with cane in R hand recommended to prevent falls

## 2015-01-04 NOTE — Assessment & Plan Note (Signed)
Sinusitis chronic and resistant with amoxicillin  levaquin 500 mg daily for 7 days flonase

## 2015-01-04 NOTE — Progress Notes (Signed)
Complaining of back pain. Pain scale#5 Had a fall two days ago  Elevated Glucose today. Had a big breakfast at 08:00 DM medication at 06:30 Tujeo

## 2015-01-04 NOTE — Progress Notes (Signed)
   Subjective:    Patient ID: [name] [name], male    DOB: 14-Nov-1960, 54 y.o.   MRN: [id number] CC: chronic low back pain, hyperglycemia  HPI   1. Chronic back pain: low back pain. Daily pain. Weakness in legs. Had a fall 3 days. Just walking in living room and went down and hit his head on the table. Has a headache. No dizziness, lightheadedness, nausea, vomiting. Still taking tylenol #3 making him sleepy, but not helping much with the pain.   2. CHRONIC DIABETES  Disease Monitoring  Blood Sugar Ranges: < 200 at home   Polyuria: no   Visual problems: no   Medication Compliance: yes  Medication Side Effects  Hypoglycemia: yes   Preventitive Health Care  Eye Exam: due   Foot Exam: not due    3. Sinus infection: no fever. Sneezing and excessive mucus. No congestion. Gets a bad smell when he breathes in.  Has taking two doses for amoxicillin.    Social History  Substance Use Topics  . Smoking status: Never Smoker   . Smokeless tobacco: Never Used  . Alcohol Use: 3.6 oz/week    6 Cans of beer per week     Comment: occassioanal glass of wine, 6 pack per week    Review of Systems  Constitutional: Negative for fever, chills, fatigue and unexpected weight change.  Eyes: Negative for visual disturbance.  Respiratory: Negative for cough and shortness of breath.   Cardiovascular: Negative for chest pain, palpitations and leg swelling.  Gastrointestinal: Negative for nausea, vomiting, abdominal pain, diarrhea, constipation and blood in stool.  Endocrine: Negative for polydipsia, polyphagia and polyuria.  Musculoskeletal: Positive for back pain and gait problem. Negative for myalgias, arthralgias and neck pain.  Skin: Negative for rash.  Allergic/Immunologic: Negative for immunocompromised state.  Hematological: Negative for adenopathy. Does not bruise/bleed easily.  Psychiatric/Behavioral: Negative for suicidal ideas, sleep disturbance and dysphoric mood. The patient is not  nervous/anxious.       Objective:   Physical Exam  Constitutional: He appears well-developed and well-nourished. No distress.  HENT:  Right Ear: Tympanic membrane and external ear normal.  Left Ear: Tympanic membrane and external ear normal.  Nose: Mucosal edema (R nares swollen ) present.  Mouth/Throat: Oropharynx is clear and moist.  Neck: Normal range of motion. Neck supple.  Cardiovascular: Normal rate, regular rhythm, normal heart sounds and intact distal pulses.   Pulmonary/Chest: Effort normal and breath sounds normal.  Musculoskeletal: He exhibits no edema.  Back Exam: Back: Normal Curvature, no deformities or CVA tenderness  Paraspinal Tenderness: lumbar spinal tenderness   LE Strength 5/5  LE Sensation: in tact  LE Reflexes 2+ and symmetric  Straight leg raise: negative   Neurological: He is alert.  Skin: Skin is warm and dry. No rash noted. No erythema.  Psychiatric: He has a normal mood and affect.  BP 149/92 mmHg  Pulse 106  Temp(Src) 98.1 F (36.7 C) (Oral)  Resp 16  Ht  (1.803 m)  Wt 202 lb (91.627 kg)  BMI 28.19 kg/m2  SpO2 96%  CBG 440 Lab Results  Component Value Date   HGBA1C 8.90 10/19/2014   DG lumbar spine 01/18/2014  IMPRESSION: Severe degenerative disc disease at L5-S1 and grade 2 spondylolisthesis of L5 on S1 approximating 1.5 cm due to bilateral L5 pars defects, progressive since 2011      Assessment & Plan:

## 2015-01-04 NOTE — Patient Instructions (Signed)
Mr. Hageman,  Thank you for coming in today  1. Chronic low back pain: toradol shot today MRI ordered Naproxen 500 mg twice daily  Walk with cane in R hand recommended to prevent falls   2. Diabetes with high sugar today: Treated Add back farxiga 10 mg  Low carbs  Diabetes blood sugar goals  Fasting (in AM before breakfast, 8 hrs of no eating or drinking (except water or unsweetened coffee or tea): 90-110 2 hrs after meals: < 160,   No low sugars: nothing < 70    3. Sinusitis: levaquin 500 mg daily for 7 days flonase    F/u in 3-4 weeks to review MRI of low back and A1c  Dr. Armen Pickup

## 2015-01-15 ENCOUNTER — Ambulatory Visit (HOSPITAL_COMMUNITY)
Admission: RE | Admit: 2015-01-15 | Discharge: 2015-01-15 | Disposition: A | Discharge status: Home / Self Care | Payer: Self-pay | Source: Ambulatory Visit | Attending: Family Medicine | Admitting: Family Medicine

## 2015-01-15 DIAGNOSIS — M545 Low back pain, unspecified: Secondary | ICD-10-CM

## 2015-01-15 DIAGNOSIS — M79604 Pain in right leg: Secondary | ICD-10-CM | POA: Insufficient documentation

## 2015-01-15 DIAGNOSIS — R531 Weakness: Secondary | ICD-10-CM | POA: Insufficient documentation

## 2015-01-15 DIAGNOSIS — M4316 Spondylolisthesis, lumbar region: Secondary | ICD-10-CM | POA: Insufficient documentation

## 2015-01-15 DIAGNOSIS — G8929 Other chronic pain: Secondary | ICD-10-CM

## 2015-01-15 DIAGNOSIS — M5106 Intervertebral disc disorders with myelopathy, lumbar region: Secondary | ICD-10-CM | POA: Insufficient documentation

## 2015-01-16 ENCOUNTER — Other Ambulatory Visit: Payer: Self-pay | Admitting: Family Medicine

## 2015-01-16 DIAGNOSIS — M5126 Other intervertebral disc displacement, lumbar region: Secondary | ICD-10-CM | POA: Insufficient documentation

## 2015-01-16 DIAGNOSIS — M4317 Spondylolisthesis, lumbosacral region: Secondary | ICD-10-CM | POA: Insufficient documentation

## 2015-01-22 ENCOUNTER — Telehealth: Payer: Self-pay | Admitting: *Deleted

## 2015-01-22 NOTE — Telephone Encounter (Signed)
LVM to return call.

## 2015-01-22 NOTE — Telephone Encounter (Signed)
-----   Message from Dessa Phi, MD sent at 01/16/2015  9:02 AM EDT ----- MRI of low back reveals broad-based disc protrusion compressing both L5 nerves  Referral to ortho placed

## 2015-01-23 NOTE — Telephone Encounter (Signed)
Pt returned call Date of birth verified by Pt MRI results given Pt stated been going to ortho

## 2015-03-11 ENCOUNTER — Other Ambulatory Visit: Payer: Self-pay | Admitting: Family Medicine

## 2015-03-12 NOTE — Telephone Encounter (Signed)
Patient called and requested a med refill for his insulin. Patient stated that he is completely out of his medication. Please f/u with pt.

## 2015-03-15 ENCOUNTER — Other Ambulatory Visit: Payer: Self-pay

## 2015-03-15 DIAGNOSIS — IMO0002 Reserved for concepts with insufficient information to code with codable children: Secondary | ICD-10-CM

## 2015-03-15 DIAGNOSIS — E1165 Type 2 diabetes mellitus with hyperglycemia: Secondary | ICD-10-CM

## 2015-03-15 DIAGNOSIS — E1140 Type 2 diabetes mellitus with diabetic neuropathy, unspecified: Secondary | ICD-10-CM

## 2015-03-15 MED ORDER — INSULIN GLARGINE 300 UNIT/ML ~~LOC~~ SOPN
40.0000 [IU] | PEN_INJECTOR | SUBCUTANEOUS | Status: DC
Start: 2015-03-15 — End: 2015-06-12

## 2015-03-15 NOTE — Telephone Encounter (Signed)
Patient presented into facility upset because his insulin has not been refilled. Please f/u with pt.

## 2015-04-10 IMAGING — CR DG LUMBAR SPINE COMPLETE 4+V
5 series · 5 of 5 positions shown · non-contrast
Comparison: Bone window images from CT abdomen and pelvis
07/01/2009.

CLINICAL DATA: Two year history of chronic low back pain.

EXAM:
LUMBAR SPINE - COMPLETE 4+ VIEW

[t l-spine a.p.]
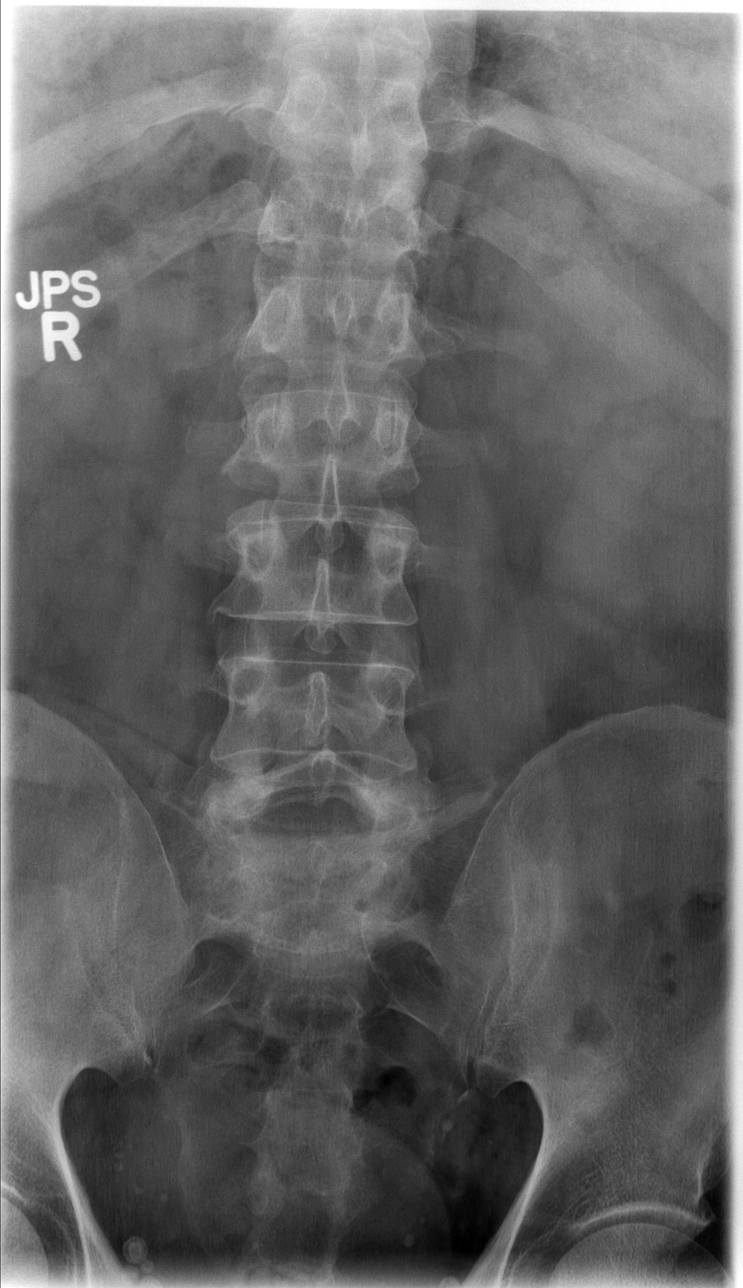

[t l-spine oblique exposure (1 of 2)]
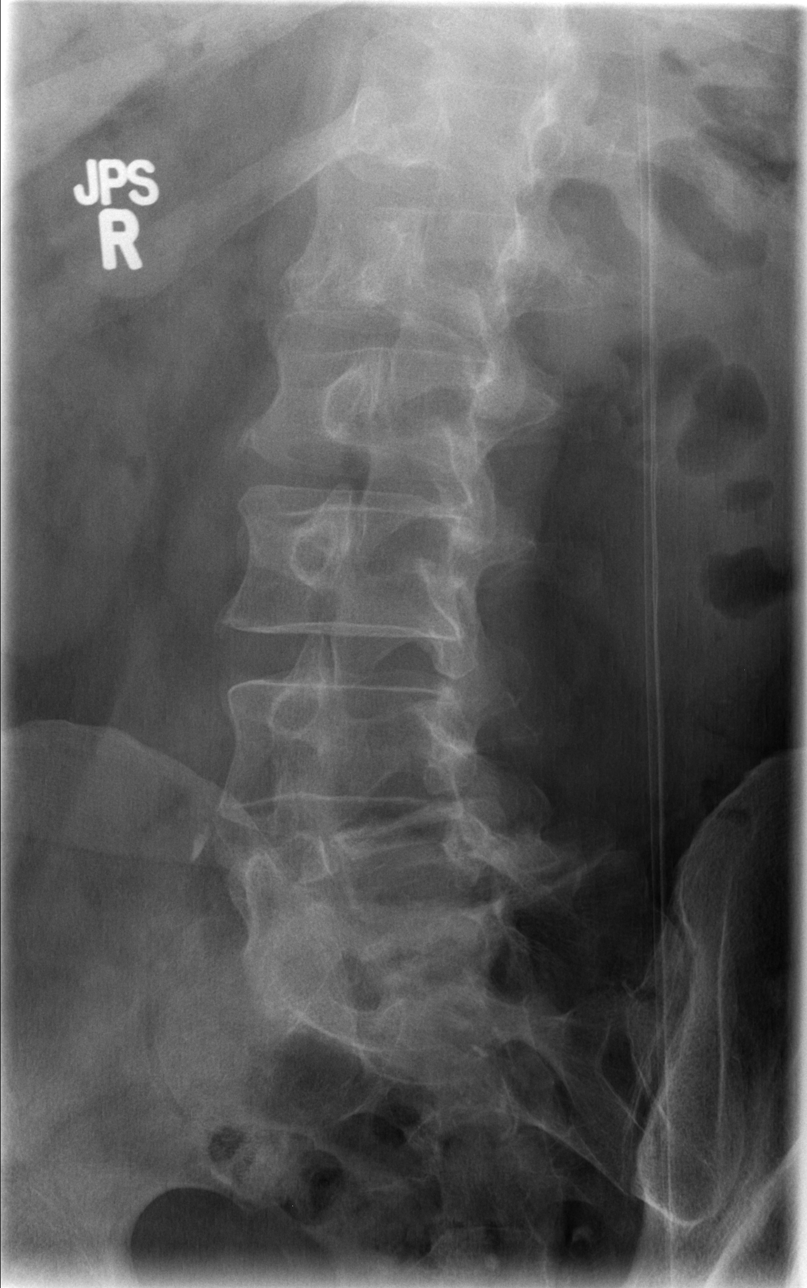

[t l-spine oblique exposure (2 of 2)]
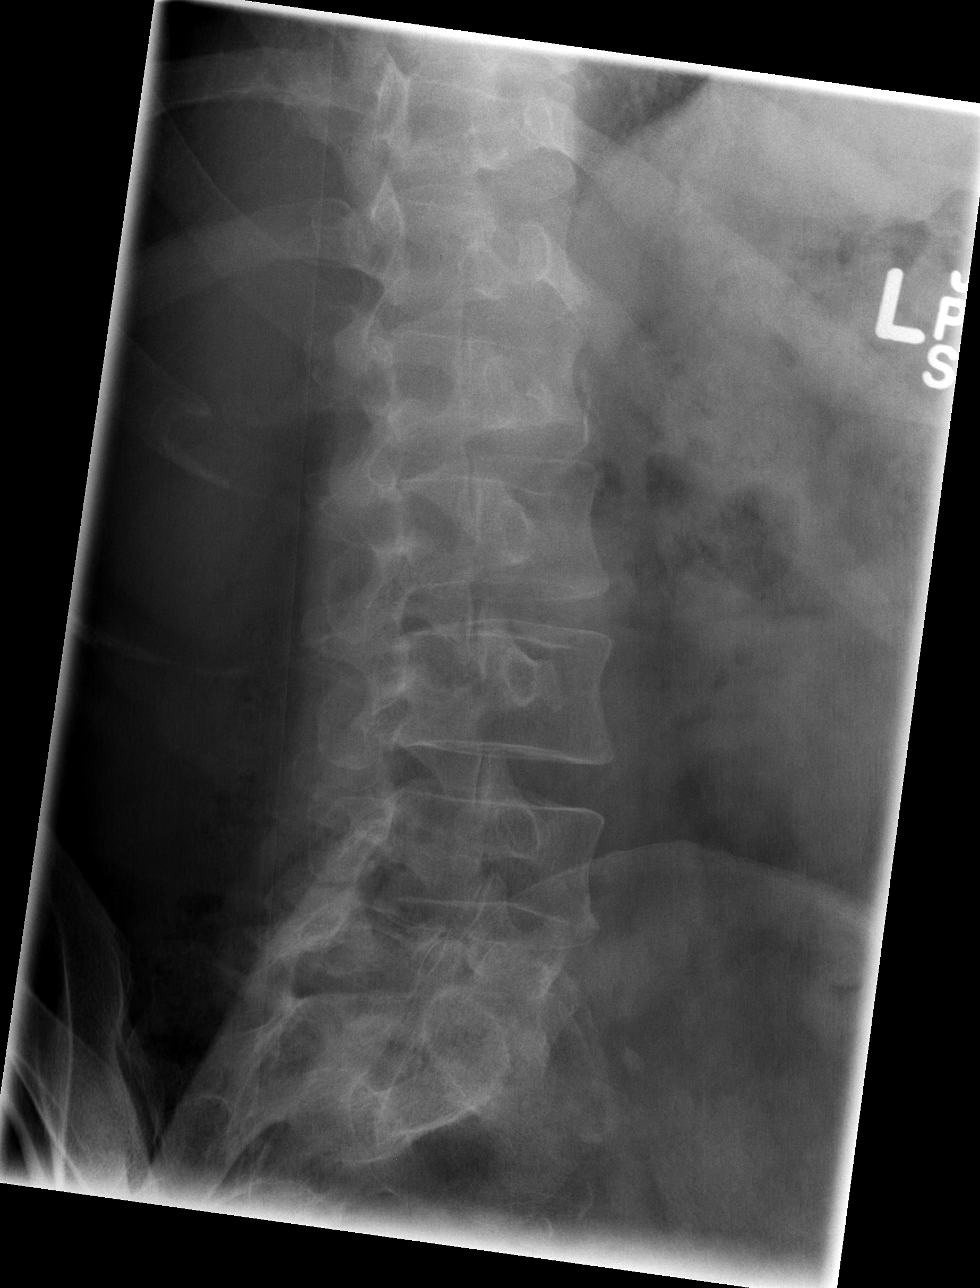

[t l-spine lat]
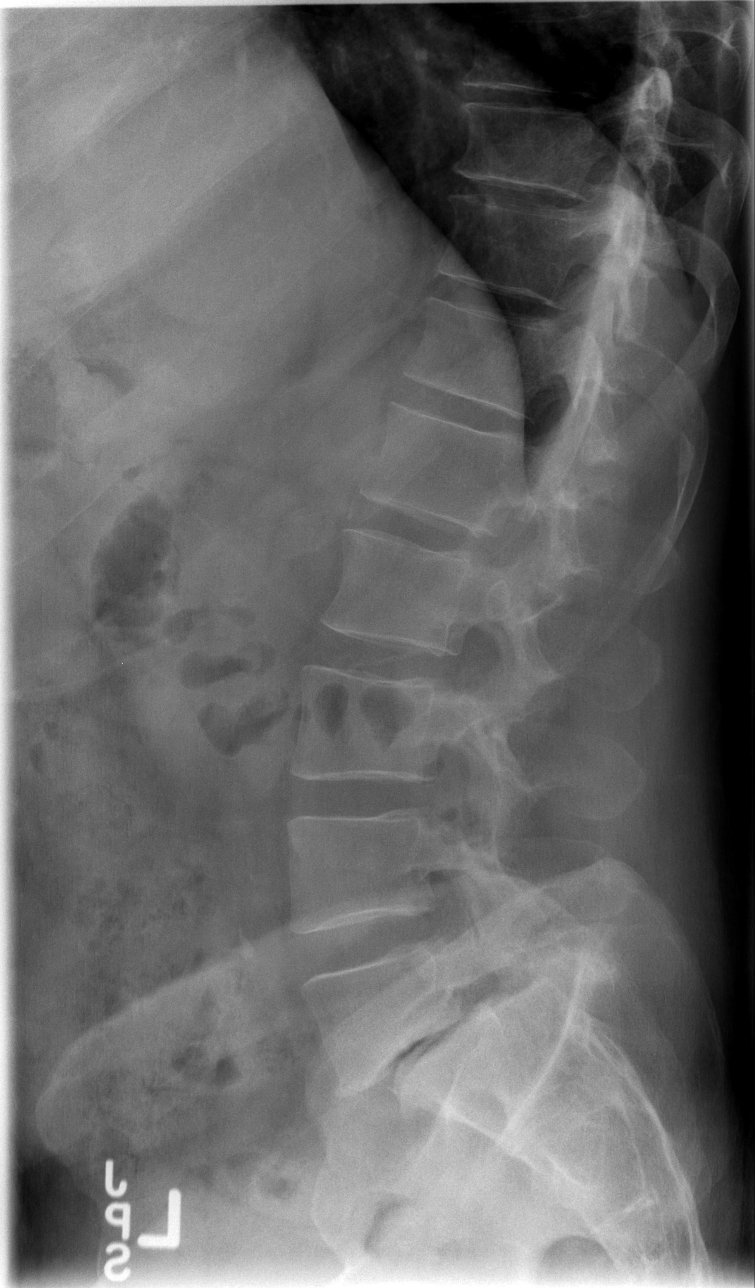

[t l-spine l5-s1 spot]
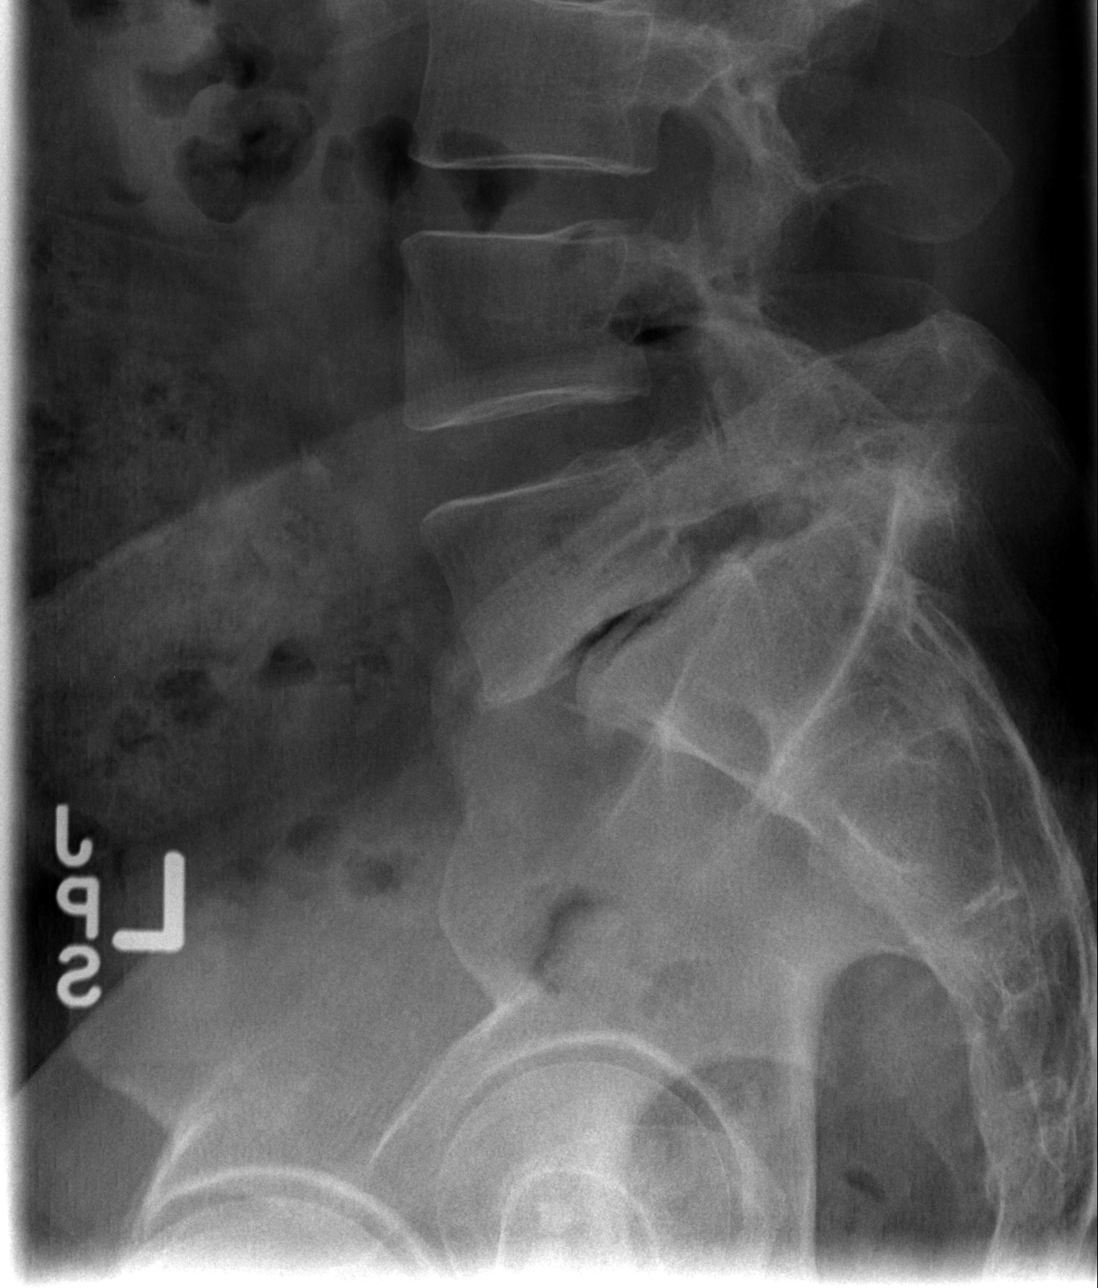

[5 of 5 positions shown; findings below may reference images not displayed]

FINDINGS: 5 non rib-bearing lumbar vertebrae. No acute fractures. Progressive
spondylolisthesis of L5 on S1 due to bilateral L5 pars defects, now
approximating 1.5 cm. Marked disc space narrowing with vacuum disc
phenomenon at L5-S 1, also progressive. Remaining disc spaces well
preserved. Visualized sacroiliac joints intact.
IMPRESSION: Severe degenerative disc disease at L5-S1 and grade 2
spondylolisthesis of L5 on S1 approximating 1.5 cm due to bilateral
L5 pars defects, progressive since [DATE].

## 2015-04-27 ENCOUNTER — Emergency Department: Payer: Self-pay

## 2015-04-27 ENCOUNTER — Encounter: Payer: Self-pay | Admitting: Emergency Medicine

## 2015-04-27 ENCOUNTER — Emergency Department
Admission: EM | Admit: 2015-04-27 | Discharge: 2015-04-27 | Disposition: A | Discharge status: Home / Self Care | Payer: Medicare Other | Attending: Emergency Medicine | Admitting: Emergency Medicine

## 2015-04-27 DIAGNOSIS — I10 Essential (primary) hypertension: Secondary | ICD-10-CM | POA: Insufficient documentation

## 2015-04-27 DIAGNOSIS — Z794 Long term (current) use of insulin: Secondary | ICD-10-CM | POA: Insufficient documentation

## 2015-04-27 DIAGNOSIS — Z7951 Long term (current) use of inhaled steroids: Secondary | ICD-10-CM | POA: Insufficient documentation

## 2015-04-27 DIAGNOSIS — Z791 Long term (current) use of non-steroidal anti-inflammatories (NSAID): Secondary | ICD-10-CM | POA: Insufficient documentation

## 2015-04-27 DIAGNOSIS — E1140 Type 2 diabetes mellitus with diabetic neuropathy, unspecified: Secondary | ICD-10-CM | POA: Insufficient documentation

## 2015-04-27 DIAGNOSIS — I25810 Atherosclerosis of coronary artery bypass graft(s) without angina pectoris: Secondary | ICD-10-CM | POA: Insufficient documentation

## 2015-04-27 DIAGNOSIS — Z792 Long term (current) use of antibiotics: Secondary | ICD-10-CM | POA: Insufficient documentation

## 2015-04-27 DIAGNOSIS — Z79899 Other long term (current) drug therapy: Secondary | ICD-10-CM | POA: Insufficient documentation

## 2015-04-27 DIAGNOSIS — Z7982 Long term (current) use of aspirin: Secondary | ICD-10-CM | POA: Insufficient documentation

## 2015-04-27 DIAGNOSIS — R0602 Shortness of breath: Secondary | ICD-10-CM | POA: Insufficient documentation

## 2015-04-27 DIAGNOSIS — R079 Chest pain, unspecified: Secondary | ICD-10-CM

## 2015-04-27 DIAGNOSIS — E119 Type 2 diabetes mellitus without complications: Secondary | ICD-10-CM | POA: Insufficient documentation

## 2015-04-27 LAB — COMPREHENSIVE METABOLIC PANEL
ALT: 22 U/L (ref 17–63)
AST: 20 U/L (ref 15–41)
Albumin: 3.9 g/dL (ref 3.5–5.0)
Alkaline Phosphatase: 81 U/L (ref 38–126)
Anion gap: 8 (ref 5–15)
BUN: 16 mg/dL (ref 6–20)
CO2: 23 mmol/L (ref 22–32)
Calcium: 8.9 mg/dL (ref 8.9–10.3)
Chloride: 105 mmol/L (ref 101–111)
Creatinine, Ser: 0.62 mg/dL (ref 0.61–1.24)
GFR calc Af Amer: >60 mL/min (ref >60)
GFR calc non Af Amer: >60 mL/min (ref >60)
Glucose, Bld: 289 mg/dL — ABNORMAL HIGH (ref 65–99)
Potassium: 4 mmol/L (ref 3.5–5.1)
Sodium: 136 mmol/L (ref 135–145)
Total Bilirubin: 1.1 mg/dL (ref 0.3–1.2)
Total Protein: 7 g/dL (ref 6.5–8.1)

## 2015-04-27 LAB — CBC WITH DIFFERENTIAL/PLATELET
Basophils Absolute: 0.1 10*3/uL (ref 0–0.1)
Basophils Relative: 1 %
Eosinophils Absolute: 0.3 10*3/uL (ref 0–0.7)
Eosinophils Relative: 4 %
HCT: 51 % (ref 40.0–52.0)
Hemoglobin: 17.2 g/dL (ref 13.0–18.0)
Lymphocytes Relative: 30 %
Lymphs Abs: 2 10*3/uL (ref 1.0–3.6)
MCH: 29.2 pg (ref 26.0–34.0)
MCHC: 33.6 g/dL (ref 32.0–36.0)
MCV: 86.8 fL (ref 80.0–100.0)
Monocytes Absolute: 0.6 10*3/uL (ref 0.2–1.0)
Monocytes Relative: 9 %
Neutro Abs: 3.9 10*3/uL (ref 1.4–6.5)
Neutrophils Relative %: 56 %
Platelets: 206 10*3/uL (ref 150–440)
RBC: 5.88 MIL/uL (ref 4.40–5.90)
RDW: 13.3 % (ref 11.5–14.5)
WBC: 6.9 10*3/uL (ref 3.8–10.6)

## 2015-04-27 LAB — TROPONIN I
Troponin I: <0.03 ng/mL (ref <0.031)
Troponin I: <0.03 ng/mL (ref <0.031)

## 2015-04-27 MED ORDER — GI COCKTAIL ~~LOC~~
30.0000 mL | Freq: Once | ORAL | Status: AC
  Administered 2015-04-27: 30 mL via ORAL
  Filled 2015-04-27: qty 30

## 2015-04-27 MED ORDER — ASPIRIN 81 MG PO CHEW
324.0000 mg | CHEWABLE_TABLET | Freq: Once | ORAL | Status: AC
  Administered 2015-04-27: 324 mg via ORAL
  Filled 2015-04-27: qty 4

## 2015-04-27 MED ORDER — MORPHINE SULFATE (PF) 4 MG/ML IV SOLN
4.0000 mg | Freq: Once | INTRAVENOUS | Status: AC
  Administered 2015-04-27: 4 mg via INTRAVENOUS
  Filled 2015-04-27: qty 1

## 2015-04-27 NOTE — Discharge Instructions (Signed)
You have been seen in the emergency department today for chest pain. Your workup has shown normal results. As we discussed please follow-up with your primary care physician in the next 1-2 days for recheck. Return to the emergency department for any further chest pain, trouble breathing, or any other symptom personally concerning to yourself. ° °Nonspecific Chest Pain °It is often hard to find the cause of chest pain. There is always a chance that your pain could be related to something serious, such as a heart attack or a blood clot in your lungs. Chest pain can also be caused by conditions that are not life-threatening. If you have chest pain, it is very important to follow up with your doctor. ° °HOME CARE °· If you were prescribed an antibiotic medicine, finish it all even if you start to feel better. °· Avoid any activities that cause chest pain. °· Do not use any tobacco products, including cigarettes, chewing tobacco, or electronic cigarettes. If you need help quitting, ask your doctor. °· Do not drink alcohol. °· Take medicines only as told by your doctor. °· Keep all follow-up visits as told by your doctor. This is important. This includes any further testing if your chest pain does not go away. °· Your doctor may tell you to keep your head raised (elevated) while you sleep. °· Make lifestyle changes as told by your doctor. These may include: °¨ Getting regular exercise. Ask your doctor to suggest some activities that are safe for you. °¨ Eating a heart-healthy diet. Your doctor or a diet specialist (dietitian) can help you to learn healthy eating options. °¨ Maintaining a healthy weight. °¨ Managing diabetes, if necessary. °¨ Reducing stress. °GET HELP IF: °· Your chest pain does not go away, even after treatment. °· You have a rash with blisters on your chest. °· You have a fever. °GET HELP RIGHT AWAY IF: °· Your chest pain is worse. °· You have an increasing cough, or you cough up blood. °· You have  severe belly (abdominal) pain. °· You feel extremely weak. °· You pass out (faint). °· You have chills. °· You have sudden, unexplained chest discomfort. °· You have sudden, unexplained discomfort in your arms, back, neck, or jaw. °· You have shortness of breath at any time. °· You suddenly start to sweat, or your skin gets clammy. °· You feel nauseous. °· You vomit. °· You suddenly feel light-headed or dizzy. °· Your heart begins to beat quickly, or it feels like it is skipping beats. °These symptoms may be an emergency. Do not wait to see if the symptoms will go away. Get medical help right away. Call your local emergency services (911 in the [place] Do not drive yourself to the hospital. °  °This information is not intended to replace advice given to you by your health care provider. Make sure you discuss any questions you have with your health care provider. °  °Document Released: 10/14/2007 Document Revised: 05/18/2014 Document Reviewed: 12/01/2013 °Elsevier Interactive Patient Education ©2016 [employer] ° °

## 2015-04-27 NOTE — ED Notes (Signed)
Patient transported to CT 

## 2015-04-27 NOTE — ED Notes (Signed)
Pt to ed with c/o chest pain that started yesterday.  Pt reports the pain as "heartburn like I have never had before"  Pt reports nausea and vomiting x 3 since it started with relief of pain after vomiting.  Pt also reports back pain and weakness and sob associated with the pain.  Pt alert and oriented and skin warm and dry at this time.

## 2015-04-27 NOTE — ED Provider Notes (Signed)
[facility] Emergency Department Provider Note  Time seen: 9:52 AM  I have reviewed the triage vital signs and the nursing notes.   HISTORY  Chief Complaint Chest Pain    HPI [name] [name] is a 54 y.o. male with a past medical history of hypertension, diabetes, hyperlipidemia, who presents the emergency department with chest pain. According to the patient feels like heartburn except it radiates to his jaw and to his left arm area nausea and vomiting 3, feel short of breath. Also describes weakness. States the pain is intermittent, started yesterday, but worsened today. States a history of 3 cardiac stents in 2005 with similar symptoms. Describes his chest pain is moderate currently dull/aching in the left chest radiating to left jaw and left arm.     Past Medical History  Diagnosis Date  . Coronary artery disease 03/2004     3 stents put in   . Apnea, sleep   . Diabetes mellitus 2010    never on insulin   . Myocardial infarction [facility]) 2005    Patient Active Problem List   Diagnosis Date Noted  . Spondylolisthesis at L5-S1 level 01/16/2015  . Rhinitis, chronic 01/04/2015  . Erectile dysfunction 10/19/2014  . Left breast mass 10/19/2014  . TMJ (temporomandibular joint syndrome) 03/26/2014  . Right shoulder pain 01/31/2014  . DM type 2, uncontrolled, with neuropathy ([place]) 01/04/2014  . CAD (coronary artery disease) of artery bypass graft 01/04/2014  . Chronic low back pain 01/04/2014  . Fracture of great toe, left, closed 01/04/2014  . Hypertension associated with diabetes ([place]) 01/04/2014  . Depression 01/04/2014  . Anxiety 01/04/2014  . OCD (obsessive compulsive disorder) 01/04/2014    Past Surgical History  Procedure Laterality Date  . Coronary stent placement  03/2004     x 3  . Hernia repair Right 2000    Inguinal     Current Outpatient Rx  Name  Route  Sig  Dispense  Refill  . ACCU-CHEK FASTCLIX LANCETS MISC   Does not apply   1  each by Does not apply route 3 (three) times daily.   100 each   3   . acetaminophen-codeine (TYLENOL #3) 300-30 MG per tablet   Oral   Take 1-2 tablets by mouth 2 (two) times daily as needed for moderate pain.   120 tablet   0   . ARIPiprazole (ABILIFY) 2 MG tablet   Oral   Take 2 mg by mouth daily.         Marland Kitchen aspirin EC 81 MG tablet   Oral   Take 81 mg by mouth daily.         Marland Kitchen atorvastatin (LIPITOR) 40 MG tablet   Oral   Take 1 tablet (40 mg total) by mouth daily.   90 tablet   3   . Blood Glucose Monitoring Suppl (ACCU-CHEK NANO SMARTVIEW) W/DEVICE KIT   Does not apply   1 Device by Does not apply route as needed.   1 kit   0   . dapagliflozin propanediol (FARXIGA) 10 MG TABS tablet   Oral   Take 10 mg by mouth daily.   90 tablet   3   . fluticasone (FLONASE) 50 MCG/ACT nasal spray   Each Nare   Place 2 sprays into both nostrils daily.   16 g   6   . gabapentin (NEURONTIN) 300 MG capsule   Oral   Take 2 capsules (600 mg total) by mouth 3 (  three) times daily. For low back pain and neuropathy   180 capsule   2   . glucose blood (ACCU-CHEK SMARTVIEW) test strip   Other   1 each by Other route 3 (three) times daily.   100 each   3   . Insulin Glargine (TOUJEO SOLOSTAR) 300 UNIT/ML SOPN   Subcutaneous   Inject 40 Units into the skin every morning.   4.5 mL   3   . Insulin Pen Needle (INSUPEN PEN NEEDLES) 32G X 4 MM MISC   Does not apply   1 each by Does not apply route daily.   90 each   3   . levofloxacin (LEVAQUIN) 500 MG tablet   Oral   Take 1 tablet (500 mg total) by mouth daily.   7 tablet   0   . lisinopril (PRINIVIL,ZESTRIL) 40 MG tablet   Oral   Take 1 tablet (40 mg total) by mouth daily.   90 tablet   1   . methocarbamol (ROBAXIN) 500 MG tablet   Oral   Take 1 tablet (500 mg total) by mouth every 8 (eight) hours as needed for muscle spasms.   90 tablet   2   . metoprolol succinate (TOPROL-XL) 25 MG 24 hr tablet   Oral    Take 1 tablet (25 mg total) by mouth daily.   90 tablet   3   . naproxen (NAPROSYN) 500 MG tablet   Oral   Take 1 tablet (500 mg total) by mouth 2 (two) times daily with a meal.   60 tablet   2   . sertraline (ZOLOFT) 100 MG tablet   Oral   Take 100 mg by mouth daily.         . sildenafil (VIAGRA) 100 MG tablet   Oral   Take 0.5-1 tablets (50-100 mg total) by mouth daily as needed for erectile dysfunction.   5 tablet   11   . sitaGLIPtin (JANUVIA) 100 MG tablet   Oral   Take 1 tablet (100 mg total) by mouth daily. Patient not taking: Reported on 01/04/2015   90 tablet   3   . traZODone (DESYREL) 50 MG tablet   Oral   Take 50 mg by mouth at bedtime.           Allergies Review of patient's allergies indicates no known allergies.  Family History  Problem Relation Age of Onset  . Diabetes Father   . Diabetes Sister   . Diabetes Paternal Grandfather   . Cancer Neg Hx     Social History Social History  Substance Use Topics  . Smoking status: Never Smoker   . Smokeless tobacco: Never Used  . Alcohol Use: 3.6 oz/week    6 Cans of beer per week     Comment: occassioanal glass of wine, 6 pack per week     Review of Systems Constitutional: Negative for fever. Cardiovascular: Positive chest pain Respiratory: Positive shortness of breath Gastrointestinal: Negative for abdominal pain Musculoskeletal: Mild radiation of the pain to the back. Neurological: Negative for headache 10-point ROS otherwise negative.  ____________________________________________   PHYSICAL EXAM:  VITAL SIGNS: ED Triage Vitals  Enc Vitals Group     BP 04/27/15 0917 148/85 mmHg     Pulse Rate 04/27/15 0917 83     Resp 04/27/15 0917 18     Temp 04/27/15 0917 97.8 F (36.6 C)     Temp Source 04/27/15 0917 Oral     SpO2  04/27/15 0917 95 %     Weight 04/27/15 0917 205 lb (92.987 kg)     Height 04/27/15 0917 _0  (1.803 m)     Head Cir --      Peak Flow --      Pain Score  04/27/15 0917 7     Pain Loc --      Pain Edu? --      Excl. in [place]? --     Constitutional: Alert and oriented. Well appearing and in no distress. Eyes: Normal exam ENT   Head: Normocephalic and atraumatic.   Mouth/Throat: Mucous membranes are moist. Cardiovascular: Normal rate, regular rhythm. No murmur Respiratory: Normal respiratory effort without tachypnea nor retractions. Breath sounds are clear Gastrointestinal: Soft and nontender. No distention.  Musculoskeletal: Nontender with normal range of motion in all extremities. No lower extremity tenderness or edema. Neurologic:  Normal speech and language. No gross focal neurologic deficits  Skin:  Skin is warm, dry and intact.  Psychiatric: Mood and affect are normal. Speech and behavior are normal.   ____________________________________________    EKG  EKG reviewed and interpreted by myself shows normal sinus rhythm at 88 bpm, narrow QRS, mild left axis deviation, largely normal intervals and nonspecific ST changes. No ST elevations.  ____________________________________________    RADIOLOGY  Chest x-ray shows no acute changes  ____________________________________________   INITIAL IMPRESSION / ASSESSMENT AND PLAN / ED COURSE  Pertinent labs & imaging results that were available during my care of the patient were reviewed by me and considered in my medical decision making (see chart for details).  Patient presents the emergency department with chest pain with radiation to jaw and left arm. Describes the pain as similar to the events which occurred in 2005 which required 3 cardiac stents. We will treat with aspirin, GI cocktail while awaiting lab results. Given the concerning nature of the chest pain and anticipate likely admission to the hospital.  Labs are within normal limits. Troponin negative. Chest x-ray negative. I discussed with the patient given his symptoms especially with radiation to the jaw into the left  arm admission to the hospital for cardiac workup. Patient strongly wishes to go home today, states the GI cocktail has helped substantially but he still has about a 3/10 chest pain. I discussed with the patient at least allowing [place] to repeat a second troponin, the patient is agreeable to this plan. We will repeat a troponin around noon, if unchanged we will again discuss discharge versus admission, however the patient strongly wishes to be discharged home  Second troponin negative. Patient states chest pain is largely resolved at this time. I I again discussed with the patient discharge with cardiology follow-up versus admission, patient wishes to be discharged from the department will call Dr. Nehemiah Massed Monday for an appointment. I discussed very strict return precautions with the patient, he is agreeable. He is to return for any further chest pain, trouble breathing, nausea or diaphoresis.  ____________________________________________   FINAL CLINICAL IMPRESSION(S) / ED DIAGNOSES  Chest pain   Harvest Dark, MD 04/27/15 1306

## 2015-05-30 MED FILL — !TOUJEO SOLOSTAR 300 UNITS/: 300/ML | 23 days supply | Qty: 2 | Fill #2

## 2015-06-12 ENCOUNTER — Other Ambulatory Visit: Payer: Self-pay | Admitting: Internal Medicine

## 2015-06-12 DIAGNOSIS — E1140 Type 2 diabetes mellitus with diabetic neuropathy, unspecified: Secondary | ICD-10-CM

## 2015-06-12 DIAGNOSIS — E1165 Type 2 diabetes mellitus with hyperglycemia: Secondary | ICD-10-CM

## 2015-06-12 DIAGNOSIS — IMO0002 Reserved for concepts with insufficient information to code with codable children: Secondary | ICD-10-CM

## 2015-06-12 MED ORDER — INSULIN GLARGINE 300 UNIT/ML ~~LOC~~ SOPN: 40.0000 [IU] | PEN_INJECTOR | SUBCUTANEOUS | Status: DC

## 2015-07-22 MED FILL — !TOUJEO SOLOSTAR 300 UNITS/: 300/ML | 23 days supply | Qty: 2 | Fill #3

## 2015-08-06 ENCOUNTER — Ambulatory Visit: Payer: Self-pay | Attending: Family Medicine | Admitting: Family Medicine

## 2015-08-06 ENCOUNTER — Encounter: Payer: Self-pay | Admitting: Family Medicine

## 2015-08-06 VITALS — BP 119/80 | HR 101 | Temp 98.5°F | Resp 16 | Ht 71.0 in | Wt 201.0 lb

## 2015-08-06 DIAGNOSIS — Z7982 Long term (current) use of aspirin: Secondary | ICD-10-CM | POA: Insufficient documentation

## 2015-08-06 DIAGNOSIS — E1165 Type 2 diabetes mellitus with hyperglycemia: Secondary | ICD-10-CM | POA: Insufficient documentation

## 2015-08-06 DIAGNOSIS — I10 Essential (primary) hypertension: Secondary | ICD-10-CM | POA: Insufficient documentation

## 2015-08-06 DIAGNOSIS — IMO0002 Reserved for concepts with insufficient information to code with codable children: Secondary | ICD-10-CM

## 2015-08-06 DIAGNOSIS — Z79899 Other long term (current) drug therapy: Secondary | ICD-10-CM | POA: Insufficient documentation

## 2015-08-06 DIAGNOSIS — E1169 Type 2 diabetes mellitus with other specified complication: Secondary | ICD-10-CM

## 2015-08-06 DIAGNOSIS — Z794 Long term (current) use of insulin: Secondary | ICD-10-CM | POA: Insufficient documentation

## 2015-08-06 DIAGNOSIS — F32A Depression, unspecified: Secondary | ICD-10-CM

## 2015-08-06 DIAGNOSIS — N6320 Unspecified lump in the left breast, unspecified quadrant: Secondary | ICD-10-CM

## 2015-08-06 DIAGNOSIS — N63 Unspecified lump in breast: Secondary | ICD-10-CM | POA: Insufficient documentation

## 2015-08-06 DIAGNOSIS — I152 Hypertension secondary to endocrine disorders: Secondary | ICD-10-CM

## 2015-08-06 DIAGNOSIS — E1159 Type 2 diabetes mellitus with other circulatory complications: Secondary | ICD-10-CM

## 2015-08-06 DIAGNOSIS — M545 Low back pain, unspecified: Secondary | ICD-10-CM

## 2015-08-06 DIAGNOSIS — F329 Major depressive disorder, single episode, unspecified: Secondary | ICD-10-CM | POA: Insufficient documentation

## 2015-08-06 DIAGNOSIS — E785 Hyperlipidemia, unspecified: Secondary | ICD-10-CM | POA: Insufficient documentation

## 2015-08-06 DIAGNOSIS — G8929 Other chronic pain: Secondary | ICD-10-CM | POA: Insufficient documentation

## 2015-08-06 DIAGNOSIS — E1140 Type 2 diabetes mellitus with diabetic neuropathy, unspecified: Secondary | ICD-10-CM

## 2015-08-06 DIAGNOSIS — Z1159 Encounter for screening for other viral diseases: Secondary | ICD-10-CM

## 2015-08-06 LAB — POCT URINALYSIS DIPSTICK
Blood, UA: NEGATIVE
Glucose, UA: 500
Ketones, UA: NEGATIVE
Leukocytes, UA: NEGATIVE
Nitrite, UA: NEGATIVE
Spec Grav, UA: 1.025
Urobilinogen, UA: 1
pH, UA: 5.5

## 2015-08-06 LAB — POCT GLYCOSYLATED HEMOGLOBIN (HGB A1C): Hemoglobin A1C: 11.6

## 2015-08-06 LAB — GLUCOSE, POCT (MANUAL RESULT ENTRY)
POC Glucose: 336 mg/dl — AB (ref 70–99)
POC Glucose: 342 mg/dl — AB (ref 70–99)

## 2015-08-06 MED ORDER — ACETAMINOPHEN-CODEINE #3 300-30 MG PO TABS: 1.0000 | ORAL_TABLET | Freq: Two times a day (BID) | ORAL | Status: DC | PRN

## 2015-08-06 MED ORDER — INSULIN GLARGINE 300 UNIT/ML ~~LOC~~ SOPN: 40.0000 [IU] | PEN_INJECTOR | SUBCUTANEOUS | Status: DC

## 2015-08-06 MED ORDER — TRAZODONE HCL 50 MG PO TABS: 50.0000 mg | ORAL_TABLET | Freq: Every day | ORAL | Status: DC

## 2015-08-06 MED ORDER — DAPAGLIFLOZIN PROPANEDIOL 10 MG PO TABS: 10.0000 mg | ORAL_TABLET | Freq: Every day | ORAL | Status: DC

## 2015-08-06 MED ORDER — ATORVASTATIN CALCIUM 40 MG PO TABS: 40.0000 mg | ORAL_TABLET | Freq: Every day | ORAL | Status: DC

## 2015-08-06 MED ORDER — GABAPENTIN 300 MG PO CAPS: 600.0000 mg | ORAL_CAPSULE | Freq: Three times a day (TID) | ORAL | Status: DC

## 2015-08-06 MED ORDER — GLUCOSE BLOOD VI STRP: 1.0000 | ORAL_STRIP | Freq: Three times a day (TID) | Status: DC

## 2015-08-06 MED ORDER — LISINOPRIL 40 MG PO TABS: 40.0000 mg | ORAL_TABLET | Freq: Every day | ORAL | Status: DC

## 2015-08-06 MED ORDER — PREGABALIN 100 MG PO CAPS: 100.0000 mg | ORAL_CAPSULE | Freq: Three times a day (TID) | ORAL | Status: DC

## 2015-08-06 MED ORDER — INSULIN ASPART 100 UNIT/ML ~~LOC~~ SOLN
10.0000 [IU] | Freq: Once | SUBCUTANEOUS | Status: AC
  Administered 2015-08-06: 10 [IU] via SUBCUTANEOUS

## 2015-08-06 MED ORDER — TRUEPLUS LANCETS 28G MISC: 1.0000 | Freq: Three times a day (TID) | Status: DC

## 2015-08-06 MED ORDER — TRUE METRIX METER W/DEVICE KIT: 1.0000 | PACK | Status: DC | PRN

## 2015-08-06 NOTE — Progress Notes (Signed)
Subjective:  Patient ID: [name] [name], male    DOB: Sep 22, 1960  Age: 55 y.o. MRN: [id number]  CC: Diabetes   HPI [name] [name] presents for   1. CHRONIC DIABETES  Disease Monitoring  Blood Sugar Ranges: not checking   Polyuria: no   Visual problems: no   Medication Compliance: no, taking toujeo only   Medication Side Effects  Hypoglycemia: no   Preventitive Health Care  Eye Exam: due   Foot Exam: done today   Diet pattern: skips meals   Exercise: no  2. L breast mass: noted in 10/2015. Not enlarging. None tender. No fever, chills or weight loss. Patient is a chronic non smoker.   3. Diabetic neuropathy: still with pain in his feet. Also in his back. Gabapentin and tylenol #3 do not help much with pain symptoms. He has never tried lyrica.   Social History  Substance Use Topics  . Smoking status: Never Smoker   . Smokeless tobacco: Never Used  . Alcohol Use: 3.6 oz/week    6 Cans of beer per week     Comment: occassioanal glass of wine, 6 pack per week     Outpatient Prescriptions Prior to Visit  Medication Sig Dispense Refill  . ACCU-CHEK FASTCLIX LANCETS MISC 1 each by Does not apply route 3 (three) times daily. 100 each 3  . acetaminophen-codeine (TYLENOL #3) 300-30 MG per tablet Take 1-2 tablets by mouth 2 (two) times daily as needed for moderate pain. 120 tablet 0  . ARIPiprazole (ABILIFY) 2 MG tablet Take 2 mg by mouth daily.    Marland Kitchen aspirin EC 81 MG tablet Take 81 mg by mouth daily.    Marland Kitchen atorvastatin (LIPITOR) 40 MG tablet Take 1 tablet (40 mg total) by mouth daily. 90 tablet 3  . Blood Glucose Monitoring Suppl (ACCU-CHEK NANO SMARTVIEW) W/DEVICE KIT 1 Device by Does not apply route as needed. 1 kit 0  . dapagliflozin propanediol (FARXIGA) 10 MG TABS tablet Take 10 mg by mouth daily. 90 tablet 3  . fluticasone (FLONASE) 50 MCG/ACT nasal spray Place 2 sprays into both nostrils daily. 16 g 6  . gabapentin (NEURONTIN) 300 MG capsule Take 2 capsules (600 mg total)  by mouth 3 (three) times daily. For low back pain and neuropathy 180 capsule 2  . glucose blood (ACCU-CHEK SMARTVIEW) test strip 1 each by Other route 3 (three) times daily. 100 each 3  . Insulin Glargine (TOUJEO SOLOSTAR) 300 UNIT/ML SOPN Inject 40 Units into the skin every morning. 9 mL 3  . Insulin Pen Needle (INSUPEN PEN NEEDLES) 32G X 4 MM MISC 1 each by Does not apply route daily. 90 each 3  . levofloxacin (LEVAQUIN) 500 MG tablet Take 1 tablet (500 mg total) by mouth daily. 7 tablet 0  . lisinopril (PRINIVIL,ZESTRIL) 40 MG tablet Take 1 tablet (40 mg total) by mouth daily. 90 tablet 1  . methocarbamol (ROBAXIN) 500 MG tablet Take 1 tablet (500 mg total) by mouth every 8 (eight) hours as needed for muscle spasms. 90 tablet 2  . metoprolol succinate (TOPROL-XL) 25 MG 24 hr tablet Take 1 tablet (25 mg total) by mouth daily. 90 tablet 3  . naproxen (NAPROSYN) 500 MG tablet Take 1 tablet (500 mg total) by mouth 2 (two) times daily with a meal. 60 tablet 2  . sertraline (ZOLOFT) 100 MG tablet Take 100 mg by mouth daily.    . sildenafil (VIAGRA) 100 MG tablet Take 0.5-1 tablets (50-100 mg total)  by mouth daily as needed for erectile dysfunction. 5 tablet 11  . sitaGLIPtin (JANUVIA) 100 MG tablet Take 1 tablet (100 mg total) by mouth daily. (Patient not taking: Reported on 01/04/2015) 90 tablet 3  . traZODone (DESYREL) 50 MG tablet Take 50 mg by mouth at bedtime.     No facility-administered medications prior to visit.    ROS Review of Systems  Constitutional: Negative for fever, chills, fatigue and unexpected weight change.  Eyes: Negative for visual disturbance.  Respiratory: Negative for cough and shortness of breath.   Cardiovascular: Negative for chest pain, palpitations and leg swelling.  Gastrointestinal: Negative for nausea, vomiting, abdominal pain, diarrhea, constipation and blood in stool.  Endocrine: Negative for polydipsia, polyphagia and polyuria.  Musculoskeletal: Negative for  myalgias, back pain, arthralgias, gait problem and neck pain.  Skin: Negative for rash.  Allergic/Immunologic: Negative for immunocompromised state.  Hematological: Negative for adenopathy. Does not bruise/bleed easily.  Psychiatric/Behavioral: Negative for suicidal ideas, sleep disturbance and dysphoric mood. The patient is not nervous/anxious.     Objective:  BP 119/80 mmHg  Pulse 101  Temp(Src) 98.5 F (36.9 C) (Oral)  Resp 16  Ht '5\' 11"'  (1.803 m)  Wt 201 lb (91.173 kg)  BMI 28.05 kg/m2  SpO2 100%  BP/Weight 08/06/2015 04/27/2015 2/95/6213  Systolic BP 086 578 469  Diastolic BP 80 95 92  Wt. (Lbs) 201 205 202  BMI 28.05 28.6 28.19     Physical Exam  Constitutional: He appears well-developed and well-nourished. No distress.  HENT:  Head: Normocephalic and atraumatic.  Neck: Normal range of motion. Neck supple.  Cardiovascular: Normal rate, regular rhythm, normal heart sounds and intact distal pulses.   Pulmonary/Chest: Effort normal and breath sounds normal.    Musculoskeletal: He exhibits no edema.  Neurological: He is alert.  Skin: Skin is warm and dry. No rash noted. No erythema.  Psychiatric: He has a normal mood and affect.   Lab Results  Component Value Date   HGBA1C 11.6 08/06/2015   Lab Results  Component Value Date   HGBA1C 11.6 08/06/2015    CBG 336 UA glucose 500, neg ketones   Treated with 10 U of novolog   Repeat CBG  342 Assessment & Plan:   There are no diagnoses linked to this encounter. Ronal was seen today for diabetes.  Diagnoses and all orders for this visit:  DM type 2, uncontrolled, with neuropathy ([place]) -     POCT glucose (manual entry) -     POCT glycosylated hemoglobin (Hb A1C) -     Microalbumin/Creatinine Ratio, Urine -     POCT urinalysis dipstick -     insulin aspart (novoLOG) injection 10 Units; Inject 0.1 mLs (10 Units total) into the skin once. -     POCT glucose (manual entry) -     dapagliflozin propanediol  (FARXIGA) 10 MG TABS tablet; Take 10 mg by mouth daily. -     Insulin Glargine (TOUJEO SOLOSTAR) 300 UNIT/ML SOPN; Inject 40 Units into the skin every morning. -     gabapentin (NEURONTIN) 300 MG capsule; Take 2 capsules (600 mg total) by mouth 3 (three) times daily. For low back pain and neuropathy -     atorvastatin (LIPITOR) 40 MG tablet; Take 1 tablet (40 mg total) by mouth daily. -     pregabalin (LYRICA) 100 MG capsule; Take 1 capsule (100 mg total) by mouth 3 (three) times daily. For PASS -     Blood Glucose Monitoring Suppl (  TRUE METRIX METER) w/Device KIT; 1 each by Does not apply route as needed. -     glucose blood (TRUE METRIX BLOOD GLUCOSE TEST) test strip; 1 each by Other route 3 (three) times daily. -     TRUEPLUS LANCETS 28G MISC; 1 each by Does not apply route 3 (three) times daily.  Hypertension associated with diabetes ([place]) -     lisinopril (PRINIVIL,ZESTRIL) 40 MG tablet; Take 1 tablet (40 mg total) by mouth daily.  Chronic low back pain -     acetaminophen-codeine (TYLENOL #3) 300-30 MG tablet; Take 1-2 tablets by mouth 2 (two) times daily as needed for moderate pain.  Hyperlipidemia associated with type 2 diabetes mellitus ([place]) -     Lipid Panel  Need for hepatitis C screening test -     Hepatitis C antibody, reflex  Depression -     traZODone (DESYREL) 50 MG tablet; Take 1 tablet (50 mg total) by mouth at bedtime.   No orders of the defined types were placed in this encounter.    Follow-up: No Follow-up on file.   Boykin Nearing MD

## 2015-08-06 NOTE — Patient Instructions (Addendum)
[name] [name] was seen today for diabetes.  Diagnoses and all orders for this visit:  DM type 2, uncontrolled, with neuropathy ([place]) -     POCT glucose (manual entry) -     POCT glycosylated hemoglobin (Hb A1C) -     Microalbumin/Creatinine Ratio, Urine -     POCT urinalysis dipstick -     insulin aspart (novoLOG) injection 10 Units; Inject 0.1 mLs (10 Units total) into the skin once. -     POCT glucose (manual entry) -     dapagliflozin propanediol (FARXIGA) 10 MG TABS tablet; Take 10 mg by mouth daily. -     Insulin Glargine (TOUJEO SOLOSTAR) 300 UNIT/ML SOPN; Inject 40 Units into the skin every morning. -     gabapentin (NEURONTIN) 300 MG capsule; Take 2 capsules (600 mg total) by mouth 3 (three) times daily. For low back pain and neuropathy -     atorvastatin (LIPITOR) 40 MG tablet; Take 1 tablet (40 mg total) by mouth daily. -     pregabalin (LYRICA) 100 MG capsule; Take 1 capsule (100 mg total) by mouth 3 (three) times daily. For PASS -     Blood Glucose Monitoring Suppl (TRUE METRIX METER) w/Device KIT; 1 each by Does not apply route as needed. -     glucose blood (TRUE METRIX BLOOD GLUCOSE TEST) test strip; 1 each by Other route 3 (three) times daily. -     TRUEPLUS LANCETS 28G MISC; 1 each by Does not apply route 3 (three) times daily.  Hypertension associated with diabetes ([place]) -     lisinopril (PRINIVIL,ZESTRIL) 40 MG tablet; Take 1 tablet (40 mg total) by mouth daily.  Chronic low back pain -     acetaminophen-codeine (TYLENOL #3) 300-30 MG tablet; Take 1-2 tablets by mouth 2 (two) times daily as needed for moderate pain.  Hyperlipidemia associated with type 2 diabetes mellitus ([facility]) -     Lipid Panel  Need for hepatitis C screening test -     Hepatitis C antibody, reflex   Diabetes blood sugar goals  Fasting (in AM before breakfast, 8 hrs of no eating or drinking (except water or unsweetened coffee or tea): 90-110 2 hrs after meals: < 160,   No low sugars: nothing < 70    Drop lyrica off at the pharmacy and apply for PASS, patient assistance   F/u in 6 weeks for diabetes, bring sugar log    Dr. Adrian Blackwater

## 2015-08-06 NOTE — Assessment & Plan Note (Addendum)
A: decline in control due to med non compliance P: Refilled all meds Ordered testing supplies  rx for lyrica with plan to transition for lyrica to gabapentin, he will need med assistance

## 2015-08-06 NOTE — Progress Notes (Signed)
F/U DM Stated glucose been running elevated  No tobacco user user  No suicidal thoughts in the past two week

## 2015-08-06 NOTE — Assessment & Plan Note (Signed)
Mass is smaller over past 9 months Reassurance Watchful waiting

## 2015-08-07 LAB — MICROALBUMIN / CREATININE URINE RATIO
Creatinine, Urine: 243 mg/dL (ref 20–370)
Microalb Creat Ratio: 11 mcg/mg creat (ref <30)
Microalb, Ur: 2.6 mg/dL

## 2015-08-07 LAB — LIPID PANEL
Cholesterol: 157 mg/dL (ref 125–200)
HDL: 25 mg/dL — ABNORMAL LOW
LDL Cholesterol: 93 mg/dL
Total CHOL/HDL Ratio: 6.3 ratio — ABNORMAL HIGH
Triglycerides: 197 mg/dL — ABNORMAL HIGH
VLDL: 39 mg/dL — ABNORMAL HIGH

## 2015-08-07 LAB — HEPATITIS C ANTIBODY: HCV Ab: NEGATIVE

## 2015-08-07 MED FILL — traZODone HCL 50 MG TABS: 50 | 30 days supply | Qty: 30 | Fill #0

## 2015-08-07 MED FILL — !TOUJEO SOLOSTAR 300 UNITS/: 300/ML | 33 days supply | Qty: 3 | Fill #0

## 2015-08-07 MED FILL — TRUE METRIX TEST STRIP: 30 days supply | Qty: 100 | Fill #0

## 2015-08-07 MED FILL — !TRUE METRIX BLOOD GLUCOSE: 1 days supply | Qty: 1 | Fill #0

## 2015-08-07 MED FILL — ACETAMINOPHEN/COD #3 TABLET: 300-30 | 30 days supply | Qty: 120 | Fill #0

## 2015-08-07 MED FILL — FARXIGA 10 MG TABLET: 10 | 30 days supply | Qty: 30 | Fill #0

## 2015-08-07 MED FILL — TRUEplus LANCETS 28G MISC: 30 days supply | Qty: 100 | Fill #0

## 2015-08-07 MED FILL — ATORVASTATIN 40 MG TABLET: 40 | 30 days supply | Qty: 30 | Fill #0

## 2015-08-07 MED FILL — LISINOPRIL 40 MG TABLET: 40 | 30 days supply | Qty: 30 | Fill #0

## 2015-08-07 MED FILL — GABAPENTIN 300 MG CAPSULE: 300 | 30 days supply | Qty: 180 | Fill #0

## 2015-10-28 ENCOUNTER — Other Ambulatory Visit: Payer: Self-pay | Admitting: Family Medicine

## 2015-10-28 ENCOUNTER — Encounter: Payer: Self-pay | Admitting: *Deleted

## 2015-10-28 DIAGNOSIS — M545 Low back pain, unspecified: Secondary | ICD-10-CM

## 2015-10-28 DIAGNOSIS — G8929 Other chronic pain: Secondary | ICD-10-CM

## 2015-10-28 MED FILL — GABAPENTIN 300 MG CAPSULE: 300 | 30 days supply | Qty: 180 | Fill #1

## 2015-10-28 MED FILL — ATORVASTATIN 40 MG TABLET: 40 | 30 days supply | Qty: 30 | Fill #1

## 2015-10-28 MED FILL — traZODone HCL 50 MG TABS: 50 | 30 days supply | Qty: 30 | Fill #1

## 2015-10-28 MED FILL — !TOUJEO SOLOSTAR 300 UNITS/: 300/ML | 33 days supply | Qty: 3 | Fill #1

## 2015-10-28 MED FILL — LISINOPRIL 40 MG TABLET: 40 | 30 days supply | Qty: 30 | Fill #1

## 2015-10-28 MED FILL — !FARXIGA 10MG TABLET: 10 | 30 days supply | Qty: 30 | Fill #1

## 2015-10-30 NOTE — Telephone Encounter (Signed)
Pt here requesting Tylenol #3 refills   Pt was upset due to schedule availability  Stated call numerous time and no answer  Appointment was schedule for July 7,2017 at 9:00 Pt notified

## 2015-10-31 ENCOUNTER — Telehealth: Payer: Self-pay

## 2015-10-31 NOTE — Telephone Encounter (Signed)
Unsuccessful call attempt to patient by RN. Attempted to advise Refill is ready for pick-up Pollyann KennedyKim Kiylee Thoreson, RN, BSN

## 2015-10-31 NOTE — Telephone Encounter (Signed)
Tylenol #3 ready for pick up Patient called.  Left VM Rx ready for pick up, placed at front desk

## 2015-11-01 MED FILL — ACETAMINOPHEN/COD #3 TABLET: 300-30 | 30 days supply | Qty: 120 | Fill #0 | Status: TO

## 2015-11-08 NOTE — Telephone Encounter (Signed)
Review of medication pick up shows patient picked up med already

## 2015-11-15 ENCOUNTER — Ambulatory Visit: Payer: PPO | Attending: Family Medicine | Admitting: Family Medicine

## 2015-11-15 ENCOUNTER — Encounter: Payer: Self-pay | Admitting: Family Medicine

## 2015-11-15 VITALS — BP 130/84 | HR 80 | Temp 98.0°F | Ht 71.0 in | Wt 202.2 lb

## 2015-11-15 DIAGNOSIS — M25511 Pain in right shoulder: Secondary | ICD-10-CM | POA: Diagnosis not present

## 2015-11-15 DIAGNOSIS — Z794 Long term (current) use of insulin: Secondary | ICD-10-CM | POA: Insufficient documentation

## 2015-11-15 DIAGNOSIS — G8929 Other chronic pain: Secondary | ICD-10-CM | POA: Diagnosis not present

## 2015-11-15 DIAGNOSIS — E1165 Type 2 diabetes mellitus with hyperglycemia: Secondary | ICD-10-CM

## 2015-11-15 DIAGNOSIS — E1140 Type 2 diabetes mellitus with diabetic neuropathy, unspecified: Secondary | ICD-10-CM

## 2015-11-15 DIAGNOSIS — I152 Hypertension secondary to endocrine disorders: Secondary | ICD-10-CM

## 2015-11-15 DIAGNOSIS — I10 Essential (primary) hypertension: Secondary | ICD-10-CM | POA: Diagnosis not present

## 2015-11-15 DIAGNOSIS — IMO0002 Reserved for concepts with insufficient information to code with codable children: Secondary | ICD-10-CM

## 2015-11-15 DIAGNOSIS — M545 Low back pain, unspecified: Secondary | ICD-10-CM

## 2015-11-15 DIAGNOSIS — N529 Male erectile dysfunction, unspecified: Secondary | ICD-10-CM | POA: Insufficient documentation

## 2015-11-15 DIAGNOSIS — E1169 Type 2 diabetes mellitus with other specified complication: Secondary | ICD-10-CM | POA: Insufficient documentation

## 2015-11-15 DIAGNOSIS — E1159 Type 2 diabetes mellitus with other circulatory complications: Secondary | ICD-10-CM

## 2015-11-15 DIAGNOSIS — Z7982 Long term (current) use of aspirin: Secondary | ICD-10-CM | POA: Insufficient documentation

## 2015-11-15 DIAGNOSIS — Z79899 Other long term (current) drug therapy: Secondary | ICD-10-CM | POA: Diagnosis not present

## 2015-11-15 LAB — POCT GLYCOSYLATED HEMOGLOBIN (HGB A1C): Hemoglobin A1C: 10.7

## 2015-11-15 LAB — GLUCOSE, POCT (MANUAL RESULT ENTRY): POC Glucose: 224 mg/dl — AB (ref 70–99)

## 2015-11-15 MED ORDER — INSULIN ASPART 100 UNIT/ML FLEXPEN: 5.0000 [IU] | PEN_INJECTOR | Freq: Three times a day (TID) | SUBCUTANEOUS | Status: DC

## 2015-11-15 MED ORDER — INSULIN PEN NEEDLE 32G X 4 MM MISC: 1.0000 | Freq: Every day | Status: DC

## 2015-11-15 MED FILL — !NOVOLOG FLEXPEN SYRINGE 1: 100/ML | 20 days supply | Qty: 3 | Fill #0

## 2015-11-15 NOTE — Progress Notes (Signed)
F/U-diabetes Medication issues-frustrated Neuropathy improved

## 2015-11-15 NOTE — Patient Instructions (Addendum)
[name] [name] was seen today for follow-up.  Diagnoses and all orders for this visit:  DM type 2, uncontrolled, with neuropathy ([facility]) -     HgB A1c -     Glucose (CBG) -     insulin aspart (NOVOLOG) 100 UNIT/ML FlexPen; Inject 5 Units into the skin 3 (three) times daily with meals.  Erectile dysfunction, unspecified erectile dysfunction type -     Ambulatory referral to Urology  Right shoulder pain -     Ambulatory referral to Sports Medicine -     Ambulatory referral to Pain Clinic  Chronic low back pain -     Ambulatory referral to Pain Clinic    No need for lyrica Continue gabapentin You will be called with referrals  Diabetes blood sugar goals  Fasting (in AM before breakfast, 8 hrs of no eating or drinking (except water or unsweetened coffee or tea): 90-110 2 hrs after meals: < 160,   No low sugars: nothing < 70   F/u in 6 weeks for diabetes   Dr. Armen PickupFunches

## 2015-11-15 NOTE — Progress Notes (Signed)
Subjective:  Patient ID: [name] [name], male    DOB: 1960-12-01  Age: 55 y.o. MRN: [id number]  CC: Follow-up   HPI [name] [name] has HTN, DM, chronic R shoulder pain, chronic low back pain, CAD and depression he presents for   1. DM2: elevated CBGs 250-350. No low CBG. Compliant with regimen. Could not tolerate metformin due to severe GI upset. Taking toujeo 40 U and farxiga 10 mg daily. Eats low carb most of the time.   2. HTN: taking lisinopril 40 mg daily. No HA, CP, SOB or blurry vision.  3. R shoulder pain: chronic pain, that has been present on and off since age 82. Pain is lateral shoulder. Pain does not radiate down arms. There is decreased ROM.  He has been evaluated by sports medicine and is s/p subacromial injection without relief of pain.   4. Low back pain: x 4.5 years, started with heavy lifting injury. Has had previous x-rays. Pain radiates down gluteal area both sides, worse on the R. Goes down to feet. Feeling like stepping on nails both sides this has improved with gabapentin.    5. ED: x 2 years. Has uncontrolled diabetes. Has tried cialis and viagra w/o improvement. Would like to see a urologist.   Social History  Substance Use Topics  . Smoking status: Never Smoker   . Smokeless tobacco: Never Used  . Alcohol Use: 3.6 oz/week    6 Cans of beer per week     Comment: occassioanal glass of wine, 6 pack per week     Outpatient Prescriptions Prior to Visit  Medication Sig Dispense Refill  . ACCU-CHEK FASTCLIX LANCETS MISC 1 each by Does not apply route 3 (three) times daily. 100 each 3  . acetaminophen-codeine (TYLENOL #3) 300-30 MG tablet TAKE ONE TO TWO TABLETS BY MOUTH TWICE DAILY AS NEEDED FOR MODERATE PAIN 120 tablet 2  . aspirin EC 81 MG tablet Take 81 mg by mouth daily. Reported on 08/06/2015    . atorvastatin (LIPITOR) 40 MG tablet Take 1 tablet (40 mg total) by mouth daily. 90 tablet 3  . Blood Glucose Monitoring Suppl (TRUE METRIX METER) w/Device KIT 1  each by Does not apply route as needed. 1 kit 0  . dapagliflozin propanediol (FARXIGA) 10 MG TABS tablet Take 10 mg by mouth daily. 90 tablet 3  . gabapentin (NEURONTIN) 300 MG capsule Take 2 capsules (600 mg total) by mouth 3 (three) times daily. For low back pain and neuropathy 180 capsule 2  . glucose blood (TRUE METRIX BLOOD GLUCOSE TEST) test strip 1 each by Other route 3 (three) times daily. 100 each 11  . Insulin Glargine (TOUJEO SOLOSTAR) 300 UNIT/ML SOPN Inject 40 Units into the skin every morning. 9 mL 3  . Insulin Pen Needle (INSUPEN PEN NEEDLES) 32G X 4 MM MISC 1 each by Does not apply route daily. 90 each 3  . lisinopril (PRINIVIL,ZESTRIL) 40 MG tablet Take 1 tablet (40 mg total) by mouth daily. 90 tablet 1  . traZODone (DESYREL) 50 MG tablet Take 1 tablet (50 mg total) by mouth at bedtime. 30 tablet 5  . TRUEPLUS LANCETS 28G MISC 1 each by Does not apply route 3 (three) times daily. 100 each 11  . ARIPiprazole (ABILIFY) 2 MG tablet Take 2 mg by mouth daily. Reported on 11/15/2015    . pregabalin (LYRICA) 100 MG capsule Take 1 capsule (100 mg total) by mouth 3 (three) times daily. For PASS (Patient not taking:  Reported on 11/15/2015) 270 capsule 3  . sertraline (ZOLOFT) 100 MG tablet Take 100 mg by mouth daily. Reported on 08/06/2015     No facility-administered medications prior to visit.    ROS Review of Systems  Constitutional: Negative for fever, chills, fatigue and unexpected weight change.  Eyes: Negative for visual disturbance.  Respiratory: Negative for cough and shortness of breath.   Cardiovascular: Negative for chest pain, palpitations and leg swelling.  Gastrointestinal: Negative for nausea, vomiting, abdominal pain, diarrhea, constipation and blood in stool.  Endocrine: Negative for polydipsia, polyphagia and polyuria.  Musculoskeletal: Positive for back pain and arthralgias. Negative for myalgias, gait problem and neck pain.  Skin: Negative for rash.    Allergic/Immunologic: Negative for immunocompromised state.  Hematological: Negative for adenopathy. Does not bruise/bleed easily.  Psychiatric/Behavioral: Negative for suicidal ideas, sleep disturbance and dysphoric mood. The patient is not nervous/anxious.     Objective:  BP 130/84 mmHg  Pulse 80  Temp(Src) 98 F (36.7 C) (Oral)  Ht 5' 11" (1.803 m)  Wt 202 lb 3.2 oz (91.717 kg)  BMI 28.21 kg/m2  SpO2 96%  BP/Weight 11/15/2015 08/06/2015 09/82/8675  Systolic BP 198 242 998  Diastolic BP 84 80 95  Wt. (Lbs) 202.2 201 205  BMI 28.21 28.05 28.6    Physical Exam  Constitutional: He appears well-developed and well-nourished. No distress.  HENT:  Head: Normocephalic and atraumatic.  Neck: Normal range of motion. Neck supple.  Cardiovascular: Normal rate, regular rhythm, normal heart sounds and intact distal pulses.   Pulmonary/Chest: Effort normal and breath sounds normal.  Musculoskeletal: He exhibits no edema.       Right shoulder: He exhibits decreased range of motion, tenderness and pain. He exhibits no bony tenderness, no swelling, no effusion, no crepitus, no deformity, no laceration, no spasm, normal pulse and normal strength.  Neurological: He is alert.  Skin: Skin is warm and dry. No rash noted. No erythema.  Psychiatric: He has a normal mood and affect.    Lab Results  Component Value Date   HGBA1C 10.7 11/15/2015   CBG 225 Assessment & Plan:   [name] [name] was seen today for follow-up.  Diagnoses and all orders for this visit:  DM type 2, uncontrolled, with neuropathy ([facility]) -     HgB A1c -     Glucose (CBG) -     insulin aspart (NOVOLOG) 100 UNIT/ML FlexPen; Inject 5 Units into the skin 3 (three) times daily with meals. -     Insulin Pen Needle (INSUPEN PEN NEEDLES) 32G X 4 MM MISC; 1 each by Does not apply route daily.  Erectile dysfunction, unspecified erectile dysfunction type -     Ambulatory referral to Urology  Right shoulder pain -     Ambulatory  referral to Sports Medicine -     Ambulatory referral to Pain Clinic  Chronic low back pain -     Ambulatory referral to Pain Clinic  Hypertension associated with diabetes ([place])    No orders of the defined types were placed in this encounter.    Follow-up: No Follow-up on file.   Boykin Nearing MD

## 2015-11-15 NOTE — Assessment & Plan Note (Signed)
Remains uncontrolled with basal insuli and farxiga  Plan: Add novolog 5 U TID with meals Continue toujeo 40 U daily Continue farxiga 10 mg daily  F/u in 6 weeks

## 2015-11-15 NOTE — Assessment & Plan Note (Signed)
Controlled. Continue current regimen. 

## 2015-11-18 ENCOUNTER — Encounter: Payer: Self-pay | Admitting: Family Medicine

## 2015-12-11 ENCOUNTER — Telehealth: Payer: Self-pay | Admitting: Family Medicine

## 2015-12-11 DIAGNOSIS — IMO0002 Reserved for concepts with insufficient information to code with codable children: Secondary | ICD-10-CM

## 2015-12-11 DIAGNOSIS — E1165 Type 2 diabetes mellitus with hyperglycemia: Secondary | ICD-10-CM

## 2015-12-11 DIAGNOSIS — E1140 Type 2 diabetes mellitus with diabetic neuropathy, unspecified: Secondary | ICD-10-CM

## 2015-12-11 MED FILL — traZODone HCL 50 MG TABS: 50 | 30 days supply | Qty: 30 | Fill #2 | Status: TO

## 2015-12-11 MED FILL — LISINOPRIL 40 MG TABLET: 40 | 30 days supply | Qty: 30 | Fill #2 | Status: TO

## 2015-12-11 MED FILL — !FARXIGA 10MG TABLET: 10 | 30 days supply | Qty: 30 | Fill #2 | Status: TO

## 2015-12-11 MED FILL — ATORVASTATIN 40 MG TABLET: 40 | 30 days supply | Qty: 30 | Fill #2 | Status: TO

## 2015-12-11 MED FILL — TRUE METRIX TEST STRIP: 30 days supply | Qty: 100 | Fill #1 | Status: TO

## 2015-12-11 MED FILL — !TOUJEO SOLOSTAR 300 UNITS/: 300/ML | 33 days supply | Qty: 3 | Fill #2 | Status: TO

## 2015-12-11 MED FILL — GABAPENTIN 300 MG CAPSULE: 300 | 30 days supply | Qty: 180 | Fill #2 | Status: TO

## 2015-12-11 MED FILL — !NOVOLOG FLEXPEN SYRINGE 1: 100/ML | 20 days supply | Qty: 3 | Fill #1 | Status: TO

## 2015-12-11 NOTE — Telephone Encounter (Signed)
Pt. Called again and stated that he only has two days of medication left. Please f/u with pt.

## 2015-12-11 NOTE — Telephone Encounter (Signed)
Pt. Called requesting to speak to his nurse regarding the following medications:   Insulin Glargine (TOUJEO SOLOSTAR) 300 UNIT/ML SOPN   insulin aspart (NOVOLOG) 100 UNIT/ML FlexPen  dapagliflozin propanediol (FARXIGA) 10 MG TABS tablet  Pt. States that he keeps having a hard time speaking with the [facility] pharmacy for him to be able to get refills for his medications. Pt. Has tried to change pharmacy's but the medications are expensive. Please f/u with pt.

## 2015-12-12 ENCOUNTER — Telehealth: Payer: Self-pay

## 2015-12-12 DIAGNOSIS — IMO0002 Reserved for concepts with insufficient information to code with codable children: Secondary | ICD-10-CM

## 2015-12-12 DIAGNOSIS — E1165 Type 2 diabetes mellitus with hyperglycemia: Secondary | ICD-10-CM

## 2015-12-12 DIAGNOSIS — E1140 Type 2 diabetes mellitus with diabetic neuropathy, unspecified: Secondary | ICD-10-CM

## 2015-12-12 MED ORDER — DAPAGLIFLOZIN PROPANEDIOL 10 MG PO TABS: 10.0000 mg | ORAL_TABLET | Freq: Every day | ORAL | 3 refills | Status: DC

## 2015-12-12 MED ORDER — INSULIN GLARGINE 300 UNIT/ML ~~LOC~~ SOPN: 40.0000 [IU] | PEN_INJECTOR | SUBCUTANEOUS | 3 refills | Status: DC

## 2015-12-12 MED ORDER — INSULIN ASPART 100 UNIT/ML FLEXPEN: 5.0000 [IU] | PEN_INJECTOR | Freq: Three times a day (TID) | SUBCUTANEOUS | 3 refills | Status: DC

## 2015-12-12 NOTE — Telephone Encounter (Signed)
No message attached

## 2015-12-12 NOTE — Telephone Encounter (Signed)
Will forward to PCP 

## 2015-12-12 NOTE — Telephone Encounter (Signed)
Refills done.

## 2015-12-12 NOTE — Telephone Encounter (Signed)
Patient was informed that medications has been sent over to the pharmacy.

## 2015-12-13 MED FILL — TOUJEO SOLOSTAR 300 UNITS/M: 300 | 22 days supply | Qty: 14 | Fill #0

## 2016-02-06 MED FILL — TOUJEO SOLOSTAR 300 UNITS/M: 300 | 22 days supply | Qty: 14 | Fill #1

## 2016-03-17 ENCOUNTER — Telehealth: Payer: Self-pay | Admitting: Family Medicine

## 2016-03-17 DIAGNOSIS — I10 Essential (primary) hypertension: Principal | ICD-10-CM

## 2016-03-17 DIAGNOSIS — E1165 Type 2 diabetes mellitus with hyperglycemia: Secondary | ICD-10-CM

## 2016-03-17 DIAGNOSIS — G8929 Other chronic pain: Secondary | ICD-10-CM

## 2016-03-17 DIAGNOSIS — IMO0002 Reserved for concepts with insufficient information to code with codable children: Secondary | ICD-10-CM

## 2016-03-17 DIAGNOSIS — E1159 Type 2 diabetes mellitus with other circulatory complications: Secondary | ICD-10-CM

## 2016-03-17 DIAGNOSIS — M545 Low back pain: Principal | ICD-10-CM

## 2016-03-17 DIAGNOSIS — E1140 Type 2 diabetes mellitus with diabetic neuropathy, unspecified: Secondary | ICD-10-CM

## 2016-03-17 DIAGNOSIS — I152 Hypertension secondary to endocrine disorders: Secondary | ICD-10-CM

## 2016-03-17 MED ORDER — LISINOPRIL 40 MG PO TABS: 40.0000 mg | ORAL_TABLET | Freq: Every day | ORAL | 0 refills | Status: DC

## 2016-03-17 MED ORDER — ACETAMINOPHEN-CODEINE #3 300-30 MG PO TABS: 1.0000 | ORAL_TABLET | Freq: Two times a day (BID) | ORAL | 2 refills | Status: DC

## 2016-03-17 MED ORDER — GABAPENTIN 300 MG PO CAPS: 600.0000 mg | ORAL_CAPSULE | Freq: Three times a day (TID) | ORAL | 0 refills | Status: DC

## 2016-03-17 MED FILL — GABAPENTIN 300 MG CAPSULE: 300 | 30 days supply | Qty: 180 | Fill #0

## 2016-03-17 MED FILL — LISINOPRIL 40 MG TABLET: 40 | 30 days supply | Qty: 30 | Fill #0

## 2016-03-17 NOTE — Telephone Encounter (Signed)
Tylenol #3 ready for pick up   

## 2016-03-17 NOTE — Telephone Encounter (Signed)
Requested medications refilled - patient needs office visit for further refills. 

## 2016-03-17 NOTE — Telephone Encounter (Signed)
Patient is needing tylenol 3. °Please follow up °

## 2016-03-17 NOTE — Telephone Encounter (Signed)
Patient called requesting a mediation refill for lisinopril and gabapentin. Please follow up.

## 2016-03-18 NOTE — Telephone Encounter (Signed)
Pt was called and informed of script being ready for pick up.

## 2016-03-19 MED FILL — TOUJEO SOLOSTAR 300 UNITS/M: 300 | 30 days supply | Qty: 5 | Fill #2

## 2016-03-23 ENCOUNTER — Other Ambulatory Visit: Payer: Self-pay | Admitting: Family Medicine

## 2016-03-23 DIAGNOSIS — E1140 Type 2 diabetes mellitus with diabetic neuropathy, unspecified: Secondary | ICD-10-CM

## 2016-03-23 DIAGNOSIS — IMO0002 Reserved for concepts with insufficient information to code with codable children: Secondary | ICD-10-CM

## 2016-03-23 DIAGNOSIS — I10 Essential (primary) hypertension: Principal | ICD-10-CM

## 2016-03-23 DIAGNOSIS — I152 Hypertension secondary to endocrine disorders: Secondary | ICD-10-CM

## 2016-03-23 DIAGNOSIS — E1159 Type 2 diabetes mellitus with other circulatory complications: Secondary | ICD-10-CM

## 2016-03-23 DIAGNOSIS — E1165 Type 2 diabetes mellitus with hyperglycemia: Secondary | ICD-10-CM

## 2016-03-23 MED FILL — ACETAMINOPHEN/COD #3 TABLET: 300-30 | 60 days supply | Qty: 120 | Fill #0 | Status: TO

## 2016-04-13 ENCOUNTER — Other Ambulatory Visit: Payer: Self-pay | Admitting: Family Medicine

## 2016-04-13 DIAGNOSIS — E1165 Type 2 diabetes mellitus with hyperglycemia: Secondary | ICD-10-CM

## 2016-04-13 DIAGNOSIS — E1140 Type 2 diabetes mellitus with diabetic neuropathy, unspecified: Secondary | ICD-10-CM

## 2016-04-13 DIAGNOSIS — I152 Hypertension secondary to endocrine disorders: Secondary | ICD-10-CM

## 2016-04-13 DIAGNOSIS — IMO0002 Reserved for concepts with insufficient information to code with codable children: Secondary | ICD-10-CM

## 2016-04-13 DIAGNOSIS — I10 Essential (primary) hypertension: Secondary | ICD-10-CM

## 2016-04-13 DIAGNOSIS — E1159 Type 2 diabetes mellitus with other circulatory complications: Secondary | ICD-10-CM

## 2016-04-13 MED FILL — LISINOPRIL 40 MG TABLET: 40 | 30 days supply | Qty: 30 | Fill #0

## 2016-04-13 MED FILL — GABAPENTIN 300 MG CAPSULE: 300 | 30 days supply | Qty: 180 | Fill #0

## 2016-04-23 MED FILL — TOUJEO SOLOSTAR 300 UNITS/M: 300 | 30 days supply | Qty: 5 | Fill #3 | Status: TO

## 2016-05-14 ENCOUNTER — Other Ambulatory Visit: Payer: Self-pay | Admitting: Family Medicine

## 2016-05-14 DIAGNOSIS — I152 Hypertension secondary to endocrine disorders: Secondary | ICD-10-CM

## 2016-05-14 DIAGNOSIS — E1140 Type 2 diabetes mellitus with diabetic neuropathy, unspecified: Secondary | ICD-10-CM

## 2016-05-14 DIAGNOSIS — I10 Essential (primary) hypertension: Principal | ICD-10-CM

## 2016-05-14 DIAGNOSIS — IMO0002 Reserved for concepts with insufficient information to code with codable children: Secondary | ICD-10-CM

## 2016-05-14 DIAGNOSIS — E1165 Type 2 diabetes mellitus with hyperglycemia: Secondary | ICD-10-CM

## 2016-05-14 DIAGNOSIS — E1159 Type 2 diabetes mellitus with other circulatory complications: Secondary | ICD-10-CM

## 2016-09-21 ENCOUNTER — Other Ambulatory Visit: Payer: Self-pay | Admitting: Pharmacist

## 2016-09-21 DIAGNOSIS — IMO0002 Reserved for concepts with insufficient information to code with codable children: Secondary | ICD-10-CM

## 2016-09-21 DIAGNOSIS — E1140 Type 2 diabetes mellitus with diabetic neuropathy, unspecified: Secondary | ICD-10-CM

## 2016-09-21 DIAGNOSIS — E1165 Type 2 diabetes mellitus with hyperglycemia: Secondary | ICD-10-CM

## 2016-09-21 MED ORDER — ATORVASTATIN CALCIUM 40 MG PO TABS: 40.0000 mg | ORAL_TABLET | Freq: Every day | ORAL | 0 refills | Status: DC

## 2016-11-12 ENCOUNTER — Observation Stay (HOSPITAL_BASED_OUTPATIENT_CLINIC_OR_DEPARTMENT_OTHER): Payer: Medicare Other

## 2016-11-12 ENCOUNTER — Inpatient Hospital Stay (HOSPITAL_COMMUNITY)
Admission: EM | Admit: 2016-11-12 | Discharge: 2016-11-25 | DRG: 231 | Disposition: A | Discharge status: Home / Self Care | Payer: Medicare Other | Attending: Cardiothoracic Surgery | Admitting: Cardiothoracic Surgery

## 2016-11-12 ENCOUNTER — Encounter (HOSPITAL_COMMUNITY)
Admission: EM | Disposition: A | Discharge status: Home / Self Care | Payer: Self-pay | Source: Home / Self Care | Attending: Cardiothoracic Surgery

## 2016-11-12 ENCOUNTER — Emergency Department (HOSPITAL_COMMUNITY): Payer: Medicare Other

## 2016-11-12 ENCOUNTER — Encounter (HOSPITAL_COMMUNITY): Payer: Self-pay | Admitting: Emergency Medicine

## 2016-11-12 DIAGNOSIS — G4700 Insomnia, unspecified: Secondary | ICD-10-CM | POA: Diagnosis present

## 2016-11-12 DIAGNOSIS — Z79891 Long term (current) use of opiate analgesic: Secondary | ICD-10-CM

## 2016-11-12 DIAGNOSIS — I509 Heart failure, unspecified: Secondary | ICD-10-CM | POA: Diagnosis present

## 2016-11-12 DIAGNOSIS — G4733 Obstructive sleep apnea (adult) (pediatric): Secondary | ICD-10-CM | POA: Diagnosis present

## 2016-11-12 DIAGNOSIS — F429 Obsessive-compulsive disorder, unspecified: Secondary | ICD-10-CM | POA: Diagnosis present

## 2016-11-12 DIAGNOSIS — E1165 Type 2 diabetes mellitus with hyperglycemia: Secondary | ICD-10-CM | POA: Diagnosis present

## 2016-11-12 DIAGNOSIS — I200 Unstable angina: Secondary | ICD-10-CM | POA: Diagnosis not present

## 2016-11-12 DIAGNOSIS — I2510 Atherosclerotic heart disease of native coronary artery without angina pectoris: Secondary | ICD-10-CM | POA: Diagnosis present

## 2016-11-12 DIAGNOSIS — F32A Depression, unspecified: Secondary | ICD-10-CM

## 2016-11-12 DIAGNOSIS — Y838 Other surgical procedures as the cause of abnormal reaction of the patient, or of later complication, without mention of misadventure at the time of the procedure: Secondary | ICD-10-CM | POA: Diagnosis present

## 2016-11-12 DIAGNOSIS — R079 Chest pain, unspecified: Secondary | ICD-10-CM | POA: Diagnosis not present

## 2016-11-12 DIAGNOSIS — IMO0002 Reserved for concepts with insufficient information to code with codable children: Secondary | ICD-10-CM

## 2016-11-12 DIAGNOSIS — Z9689 Presence of other specified functional implants: Secondary | ICD-10-CM

## 2016-11-12 DIAGNOSIS — I2583 Coronary atherosclerosis due to lipid rich plaque: Secondary | ICD-10-CM | POA: Diagnosis present

## 2016-11-12 DIAGNOSIS — Z9119 Patient's noncompliance with other medical treatment and regimen: Secondary | ICD-10-CM

## 2016-11-12 DIAGNOSIS — T82867A Thrombosis of cardiac prosthetic devices, implants and grafts, initial encounter: Secondary | ICD-10-CM | POA: Diagnosis not present

## 2016-11-12 DIAGNOSIS — E785 Hyperlipidemia, unspecified: Secondary | ICD-10-CM | POA: Diagnosis present

## 2016-11-12 DIAGNOSIS — I152 Hypertension secondary to endocrine disorders: Secondary | ICD-10-CM | POA: Diagnosis present

## 2016-11-12 DIAGNOSIS — G8929 Other chronic pain: Secondary | ICD-10-CM | POA: Diagnosis present

## 2016-11-12 DIAGNOSIS — I25118 Atherosclerotic heart disease of native coronary artery with other forms of angina pectoris: Secondary | ICD-10-CM

## 2016-11-12 DIAGNOSIS — I214 Non-ST elevation (NSTEMI) myocardial infarction: Secondary | ICD-10-CM | POA: Diagnosis present

## 2016-11-12 DIAGNOSIS — R000 Tachycardia, unspecified: Secondary | ICD-10-CM | POA: Diagnosis not present

## 2016-11-12 DIAGNOSIS — I10 Essential (primary) hypertension: Secondary | ICD-10-CM | POA: Diagnosis not present

## 2016-11-12 DIAGNOSIS — J310 Chronic rhinitis: Secondary | ICD-10-CM | POA: Diagnosis present

## 2016-11-12 DIAGNOSIS — I480 Paroxysmal atrial fibrillation: Secondary | ICD-10-CM | POA: Diagnosis not present

## 2016-11-12 DIAGNOSIS — E1140 Type 2 diabetes mellitus with diabetic neuropathy, unspecified: Secondary | ICD-10-CM | POA: Diagnosis not present

## 2016-11-12 DIAGNOSIS — Z79899 Other long term (current) drug therapy: Secondary | ICD-10-CM

## 2016-11-12 DIAGNOSIS — Z560 Unemployment, unspecified: Secondary | ICD-10-CM

## 2016-11-12 DIAGNOSIS — I959 Hypotension, unspecified: Secondary | ICD-10-CM | POA: Diagnosis not present

## 2016-11-12 DIAGNOSIS — E1169 Type 2 diabetes mellitus with other specified complication: Secondary | ICD-10-CM | POA: Diagnosis present

## 2016-11-12 DIAGNOSIS — E119 Type 2 diabetes mellitus without complications: Secondary | ICD-10-CM | POA: Diagnosis present

## 2016-11-12 DIAGNOSIS — Z955 Presence of coronary angioplasty implant and graft: Secondary | ICD-10-CM

## 2016-11-12 DIAGNOSIS — E1159 Type 2 diabetes mellitus with other circulatory complications: Secondary | ICD-10-CM | POA: Diagnosis present

## 2016-11-12 DIAGNOSIS — G8918 Other acute postprocedural pain: Secondary | ICD-10-CM | POA: Diagnosis not present

## 2016-11-12 DIAGNOSIS — E8770 Fluid overload, unspecified: Secondary | ICD-10-CM | POA: Diagnosis not present

## 2016-11-12 DIAGNOSIS — Z794 Long term (current) use of insulin: Secondary | ICD-10-CM

## 2016-11-12 DIAGNOSIS — I25110 Atherosclerotic heart disease of native coronary artery with unstable angina pectoris: Secondary | ICD-10-CM

## 2016-11-12 DIAGNOSIS — K219 Gastro-esophageal reflux disease without esophagitis: Secondary | ICD-10-CM | POA: Diagnosis present

## 2016-11-12 DIAGNOSIS — J90 Pleural effusion, not elsewhere classified: Secondary | ICD-10-CM

## 2016-11-12 DIAGNOSIS — Z833 Family history of diabetes mellitus: Secondary | ICD-10-CM

## 2016-11-12 DIAGNOSIS — M545 Other chronic pain: Secondary | ICD-10-CM | POA: Diagnosis present

## 2016-11-12 DIAGNOSIS — M797 Fibromyalgia: Secondary | ICD-10-CM | POA: Diagnosis present

## 2016-11-12 DIAGNOSIS — I110 Hypertensive heart disease with heart failure: Secondary | ICD-10-CM | POA: Diagnosis present

## 2016-11-12 DIAGNOSIS — Z7982 Long term (current) use of aspirin: Secondary | ICD-10-CM

## 2016-11-12 DIAGNOSIS — F329 Major depressive disorder, single episode, unspecified: Secondary | ICD-10-CM | POA: Diagnosis present

## 2016-11-12 DIAGNOSIS — Z951 Presence of aortocoronary bypass graft: Secondary | ICD-10-CM

## 2016-11-12 DIAGNOSIS — N529 Male erectile dysfunction, unspecified: Secondary | ICD-10-CM | POA: Diagnosis present

## 2016-11-12 DIAGNOSIS — I252 Old myocardial infarction: Secondary | ICD-10-CM

## 2016-11-12 DIAGNOSIS — D62 Acute posthemorrhagic anemia: Secondary | ICD-10-CM | POA: Diagnosis not present

## 2016-11-12 DIAGNOSIS — D696 Thrombocytopenia, unspecified: Secondary | ICD-10-CM | POA: Diagnosis not present

## 2016-11-12 DIAGNOSIS — F419 Anxiety disorder, unspecified: Secondary | ICD-10-CM | POA: Diagnosis present

## 2016-11-12 HISTORY — DX: Type 2 diabetes mellitus without complications: E11.9

## 2016-11-12 HISTORY — DX: Major depressive disorder, single episode, unspecified: F32.9

## 2016-11-12 HISTORY — DX: Other chronic pain: G89.29

## 2016-11-12 HISTORY — DX: Hyperlipidemia, unspecified: E78.5

## 2016-11-12 HISTORY — DX: Anxiety disorder, unspecified: F41.9

## 2016-11-12 HISTORY — DX: Migraine, unspecified, not intractable, without status migrainosus: G43.909

## 2016-11-12 HISTORY — DX: Low back pain: M54.5

## 2016-11-12 HISTORY — DX: Obstructive sleep apnea (adult) (pediatric): G47.33

## 2016-11-12 HISTORY — DX: Personal history of other diseases of the digestive system: Z87.19

## 2016-11-12 HISTORY — DX: Gastro-esophageal reflux disease without esophagitis: K21.9

## 2016-11-12 HISTORY — DX: Fibromyalgia: M79.7

## 2016-11-12 HISTORY — DX: Depression, unspecified: F32.A

## 2016-11-12 HISTORY — PX: CARDIAC CATHETERIZATION: SHX172

## 2016-11-12 HISTORY — DX: Low back pain, unspecified: M54.50

## 2016-11-12 HISTORY — DX: Essential (primary) hypertension: I10

## 2016-11-12 HISTORY — PX: LEFT HEART CATH AND CORONARY ANGIOGRAPHY: CATH118249

## 2016-11-12 LAB — GLUCOSE, CAPILLARY
Glucose-Capillary: 280 mg/dL — ABNORMAL HIGH (ref 65–99)
Glucose-Capillary: 99 mg/dL (ref 65–99)
Glucose-Capillary: 367 mg/dL — ABNORMAL HIGH (ref 65–99)

## 2016-11-12 LAB — CREATININE, SERUM
Creatinine, Ser: 0.86 mg/dL (ref 0.61–1.24)
GFR calc Af Amer: >60 mL/min (ref >60)
GFR calc non Af Amer: >60 mL/min (ref >60)

## 2016-11-12 LAB — ECHOCARDIOGRAM COMPLETE
Height: 71 in
Weight: 3276.8 oz

## 2016-11-12 LAB — I-STAT TROPONIN, ED: Troponin i, poc: 0.01 ng/mL (ref 0.00–0.08)

## 2016-11-12 LAB — CBC
HCT: 49.4 % (ref 39.0–52.0)
HCT: 45.4 % (ref 39.0–52.0)
Hemoglobin: 16.5 g/dL (ref 13.0–17.0)
Hemoglobin: 15.7 g/dL (ref 13.0–17.0)
MCH: 29.7 pg (ref 26.0–34.0)
MCH: 30.5 pg (ref 26.0–34.0)
MCHC: 33.4 g/dL (ref 30.0–36.0)
MCHC: 34.6 g/dL (ref 30.0–36.0)
MCV: 88.8 fL (ref 78.0–100.0)
MCV: 88.3 fL (ref 78.0–100.0)
Platelets: 170 10*3/uL (ref 150–400)
Platelets: 151 10*3/uL (ref 150–400)
RBC: 5.56 MIL/uL (ref 4.22–5.81)
RBC: 5.14 MIL/uL (ref 4.22–5.81)
RDW: 13.5 % (ref 11.5–15.5)
RDW: 13.3 % (ref 11.5–15.5)
WBC: 8.3 10*3/uL (ref 4.0–10.5)
WBC: 8.4 10*3/uL (ref 4.0–10.5)

## 2016-11-12 LAB — CBC WITH DIFFERENTIAL/PLATELET
Basophils Absolute: 0.1 10*3/uL (ref 0.0–0.1)
Basophils Relative: 1 %
Eosinophils Absolute: 0.3 10*3/uL (ref 0.0–0.7)
Eosinophils Relative: 3 %
HCT: 41.5 % (ref 39.0–52.0)
Hemoglobin: 14.2 g/dL (ref 13.0–17.0)
Lymphocytes Relative: 34 %
Lymphs Abs: 3 10*3/uL (ref 0.7–4.0)
MCH: 30.3 pg (ref 26.0–34.0)
MCHC: 34.2 g/dL (ref 30.0–36.0)
MCV: 88.5 fL (ref 78.0–100.0)
Monocytes Absolute: 0.8 10*3/uL (ref 0.1–1.0)
Monocytes Relative: 9 %
Neutro Abs: 4.7 10*3/uL (ref 1.7–7.7)
Neutrophils Relative %: 53 %
Platelets: 189 10*3/uL (ref 150–400)
RBC: 4.69 MIL/uL (ref 4.22–5.81)
RDW: 13.5 % (ref 11.5–15.5)
WBC: 8.9 10*3/uL (ref 4.0–10.5)

## 2016-11-12 LAB — BASIC METABOLIC PANEL
Anion gap: 11 (ref 5–15)
BUN: 14 mg/dL (ref 6–20)
CO2: 23 mmol/L (ref 22–32)
Calcium: 9.3 mg/dL (ref 8.9–10.3)
Chloride: 104 mmol/L (ref 101–111)
Creatinine, Ser: 0.85 mg/dL (ref 0.61–1.24)
GFR calc Af Amer: >60 mL/min (ref >60)
GFR calc non Af Amer: >60 mL/min (ref >60)
Glucose, Bld: 267 mg/dL — ABNORMAL HIGH (ref 65–99)
Potassium: 3.8 mmol/L (ref 3.5–5.1)
Sodium: 138 mmol/L (ref 135–145)

## 2016-11-12 LAB — TROPONIN I
Troponin I: <0.03 ng/mL (ref <0.03)
Troponin I: <0.03 ng/mL (ref <0.03)
Troponin I: <0.03 ng/mL (ref <0.03)
Troponin I: <0.03 ng/mL (ref <0.03)

## 2016-11-12 LAB — POCT ACTIVATED CLOTTING TIME: Activated Clotting Time: 175 seconds

## 2016-11-12 LAB — D-DIMER, QUANTITATIVE: D-Dimer, Quant: <0.27 ug/mL-FEU (ref 0.00–0.50)

## 2016-11-12 SURGERY — LEFT HEART CATH AND CORONARY ANGIOGRAPHY
Anesthesia: LOCAL

## 2016-11-12 MED ORDER — SODIUM CHLORIDE 0.9 % WEIGHT BASED INFUSION: 3.0000 mL/kg/h | INTRAVENOUS | Status: DC

## 2016-11-12 MED ORDER — SODIUM CHLORIDE 0.9 % IV SOLN
250.0000 mL | INTRAVENOUS | Status: DC | PRN
Start: 2016-11-12 — End: 2016-11-17
  Administered 2016-11-17: 07:00:00 via INTRAVENOUS

## 2016-11-12 MED ORDER — HEPARIN SODIUM (PORCINE) 5000 UNIT/ML IJ SOLN
5000.0000 [IU] | Freq: Three times a day (TID) | INTRAMUSCULAR | Status: DC
  Administered 2016-11-12: 5000 [IU] via SUBCUTANEOUS
  Filled 2016-11-12: qty 1

## 2016-11-12 MED ORDER — SODIUM CHLORIDE 0.9% FLUSH
3.0000 mL | Freq: Two times a day (BID) | INTRAVENOUS | Status: DC
  Administered 2016-11-12 – 2016-11-13 (×3): 3 mL via INTRAVENOUS

## 2016-11-12 MED ORDER — ACETAMINOPHEN 325 MG PO TABS
650.0000 mg | ORAL_TABLET | ORAL | Status: DC | PRN
  Administered 2016-11-12: 650 mg via ORAL
  Filled 2016-11-12: qty 2

## 2016-11-12 MED ORDER — SODIUM CHLORIDE 0.9 % IV BOLUS (SEPSIS)
500.0000 mL | Freq: Once | INTRAVENOUS | Status: AC
  Administered 2016-11-12: 500 mL via INTRAVENOUS

## 2016-11-12 MED ORDER — FENTANYL CITRATE (PF) 100 MCG/2ML IJ SOLN
INTRAMUSCULAR | Status: DC | PRN
  Administered 2016-11-12: 25 ug via INTRAVENOUS

## 2016-11-12 MED ORDER — LIDOCAINE HCL (PF) 1 % IJ SOLN
INTRAMUSCULAR | Status: DC | PRN
  Administered 2016-11-12: 15 mL
  Administered 2016-11-12: 2 mL

## 2016-11-12 MED ORDER — SODIUM CHLORIDE 0.9 % IV SOLN: 250.0000 mL | INTRAVENOUS | Status: DC | PRN

## 2016-11-12 MED ORDER — MORPHINE SULFATE (PF) 4 MG/ML IV SOLN
4.0000 mg | Freq: Once | INTRAVENOUS | Status: AC
  Administered 2016-11-12: 4 mg via INTRAVENOUS
  Filled 2016-11-12: qty 1

## 2016-11-12 MED ORDER — IOPAMIDOL (ISOVUE-370) INJECTION 76%
INTRAVENOUS | Status: DC | PRN
  Administered 2016-11-12: 125 mL via INTRA_ARTERIAL

## 2016-11-12 MED ORDER — GABAPENTIN 300 MG PO CAPS
600.0000 mg | ORAL_CAPSULE | Freq: Three times a day (TID) | ORAL | Status: DC
  Administered 2016-11-12 – 2016-11-25 (×37): 600 mg via ORAL
  Filled 2016-11-12 (×37): qty 2

## 2016-11-12 MED ORDER — HEPARIN (PORCINE) IN NACL 100-0.45 UNIT/ML-% IJ SOLN
1400.0000 [IU]/h | INTRAMUSCULAR | Status: DC
  Administered 2016-11-12: 1250 [IU]/h via INTRAVENOUS
  Administered 2016-11-13 – 2016-11-16 (×5): 1400 [IU]/h via INTRAVENOUS
  Filled 2016-11-12 (×6): qty 250

## 2016-11-12 MED ORDER — TRAZODONE HCL 50 MG PO TABS
100.0000 mg | ORAL_TABLET | Freq: Every day | ORAL | Status: DC
  Administered 2016-11-12 – 2016-11-24 (×12): 100 mg via ORAL
  Filled 2016-11-12: qty 1
  Filled 2016-11-12: qty 2
  Filled 2016-11-12 (×2): qty 1
  Filled 2016-11-12: qty 2
  Filled 2016-11-12: qty 1
  Filled 2016-11-12: qty 2
  Filled 2016-11-12: qty 1
  Filled 2016-11-12 (×4): qty 2

## 2016-11-12 MED ORDER — HEPARIN SODIUM (PORCINE) 1000 UNIT/ML IJ SOLN
INTRAMUSCULAR | Status: DC | PRN
  Administered 2016-11-12: 5000 [IU] via INTRAVENOUS

## 2016-11-12 MED ORDER — IOPAMIDOL (ISOVUE-370) INJECTION 76%
INTRAVENOUS | Status: AC
  Filled 2016-11-12: qty 50

## 2016-11-12 MED ORDER — INSULIN ASPART 100 UNIT/ML FLEXPEN: 20.0000 [IU] | PEN_INJECTOR | Freq: Three times a day (TID) | SUBCUTANEOUS | Status: DC

## 2016-11-12 MED ORDER — SODIUM CHLORIDE 0.9 % IV BOLUS (SEPSIS)
1000.0000 mL | Freq: Once | INTRAVENOUS | Status: AC
  Administered 2016-11-12: 1000 mL via INTRAVENOUS

## 2016-11-12 MED ORDER — HEPARIN (PORCINE) IN NACL 2-0.9 UNIT/ML-% IJ SOLN
INTRAMUSCULAR | Status: AC
  Filled 2016-11-12: qty 1000

## 2016-11-12 MED ORDER — MIDAZOLAM HCL 2 MG/2ML IJ SOLN
INTRAMUSCULAR | Status: DC | PRN
  Administered 2016-11-12: 1 mg via INTRAVENOUS

## 2016-11-12 MED ORDER — SODIUM CHLORIDE 0.9% FLUSH
3.0000 mL | INTRAVENOUS | Status: DC | PRN
Start: 2016-11-12 — End: 2016-11-17

## 2016-11-12 MED ORDER — IOPAMIDOL (ISOVUE-370) INJECTION 76%
INTRAVENOUS | Status: AC
  Filled 2016-11-12: qty 100

## 2016-11-12 MED ORDER — ASPIRIN 81 MG PO CHEW
324.0000 mg | CHEWABLE_TABLET | Freq: Once | ORAL | Status: AC
  Administered 2016-11-12: 324 mg via ORAL
  Filled 2016-11-12: qty 4

## 2016-11-12 MED ORDER — INSULIN ASPART 100 UNIT/ML ~~LOC~~ SOLN: 20.0000 [IU] | Freq: Three times a day (TID) | SUBCUTANEOUS | Status: DC

## 2016-11-12 MED ORDER — INSULIN GLARGINE 100 UNIT/ML ~~LOC~~ SOLN
40.0000 [IU] | Freq: Every day | SUBCUTANEOUS | Status: DC
  Administered 2016-11-12 – 2016-11-13 (×2): 40 [IU] via SUBCUTANEOUS
  Filled 2016-11-12 (×2): qty 0.4

## 2016-11-12 MED ORDER — HYDRALAZINE HCL 20 MG/ML IJ SOLN: 10.0000 mg | Freq: Three times a day (TID) | INTRAMUSCULAR | Status: DC | PRN

## 2016-11-12 MED ORDER — SODIUM CHLORIDE 0.9 % WEIGHT BASED INFUSION: 1.0000 mL/kg/h | INTRAVENOUS | Status: DC

## 2016-11-12 MED ORDER — ONDANSETRON HCL 4 MG/2ML IJ SOLN: 4.0000 mg | Freq: Four times a day (QID) | INTRAMUSCULAR | Status: DC | PRN

## 2016-11-12 MED ORDER — MIDAZOLAM HCL 2 MG/2ML IJ SOLN
INTRAMUSCULAR | Status: AC
  Filled 2016-11-12: qty 2

## 2016-11-12 MED ORDER — HEPARIN SODIUM (PORCINE) 1000 UNIT/ML IJ SOLN
INTRAMUSCULAR | Status: AC
  Filled 2016-11-12: qty 1

## 2016-11-12 MED ORDER — LISINOPRIL 40 MG PO TABS
40.0000 mg | ORAL_TABLET | Freq: Every day | ORAL | Status: DC
  Administered 2016-11-12: 40 mg via ORAL
  Filled 2016-11-12: qty 2

## 2016-11-12 MED ORDER — ZOLPIDEM TARTRATE 5 MG PO TABS: 5.0000 mg | ORAL_TABLET | Freq: Every evening | ORAL | Status: DC | PRN

## 2016-11-12 MED ORDER — HEPARIN (PORCINE) IN NACL 2-0.9 UNIT/ML-% IJ SOLN
INTRAMUSCULAR | Status: AC | PRN
  Administered 2016-11-12: 1000 mL via INTRA_ARTERIAL

## 2016-11-12 MED ORDER — SODIUM CHLORIDE 0.9% FLUSH: 3.0000 mL | Freq: Two times a day (BID) | INTRAVENOUS | Status: DC

## 2016-11-12 MED ORDER — NITROGLYCERIN 2 % TD OINT
1.0000 [in_us] | TOPICAL_OINTMENT | Freq: Once | TRANSDERMAL | Status: AC
  Administered 2016-11-12: 1 [in_us] via TOPICAL
  Filled 2016-11-12: qty 1

## 2016-11-12 MED ORDER — METOPROLOL TARTRATE 12.5 MG HALF TABLET
12.5000 mg | ORAL_TABLET | Freq: Two times a day (BID) | ORAL | Status: DC
  Administered 2016-11-12 – 2016-11-16 (×9): 12.5 mg via ORAL
  Filled 2016-11-12 (×9): qty 1

## 2016-11-12 MED ORDER — SODIUM CHLORIDE 0.9 % IV SOLN: INTRAVENOUS | Status: AC

## 2016-11-12 MED ORDER — ASPIRIN 81 MG PO CHEW
324.0000 mg | CHEWABLE_TABLET | Freq: Once | ORAL | Status: AC
  Administered 2016-11-13: 324 mg via ORAL
  Filled 2016-11-12: qty 4

## 2016-11-12 MED ORDER — NITROGLYCERIN 1 MG/10 ML FOR IR/CATH LAB
INTRA_ARTERIAL | Status: AC
  Filled 2016-11-12: qty 10

## 2016-11-12 MED ORDER — ONDANSETRON HCL 4 MG/2ML IJ SOLN
4.0000 mg | Freq: Once | INTRAMUSCULAR | Status: AC
  Administered 2016-11-12: 4 mg via INTRAVENOUS
  Filled 2016-11-12: qty 2

## 2016-11-12 MED ORDER — ATORVASTATIN CALCIUM 40 MG PO TABS: 40.0000 mg | ORAL_TABLET | Freq: Every day | ORAL | Status: DC

## 2016-11-12 MED ORDER — NITROGLYCERIN IN D5W 200-5 MCG/ML-% IV SOLN
0.0000 ug/min | INTRAVENOUS | Status: DC
  Administered 2016-11-12: 5 ug/min via INTRAVENOUS
  Filled 2016-11-12: qty 250

## 2016-11-12 MED ORDER — ACETAMINOPHEN 325 MG PO TABS: 650.0000 mg | ORAL_TABLET | ORAL | Status: DC | PRN

## 2016-11-12 MED ORDER — FENTANYL CITRATE (PF) 100 MCG/2ML IJ SOLN
INTRAMUSCULAR | Status: AC
  Filled 2016-11-12: qty 2

## 2016-11-12 MED ORDER — MORPHINE SULFATE (PF) 2 MG/ML IV SOLN: 2.0000 mg | INTRAVENOUS | Status: DC | PRN

## 2016-11-12 MED ORDER — HYDRALAZINE HCL 20 MG/ML IJ SOLN: 10.0000 mg | INTRAMUSCULAR | Status: DC | PRN

## 2016-11-12 MED ORDER — INSULIN ASPART 100 UNIT/ML ~~LOC~~ SOLN
0.0000 [IU] | Freq: Three times a day (TID) | SUBCUTANEOUS | Status: DC
  Administered 2016-11-12: 8 [IU] via SUBCUTANEOUS
  Administered 2016-11-13 – 2016-11-14 (×4): 5 [IU] via SUBCUTANEOUS
  Administered 2016-11-14 – 2016-11-15 (×3): 3 [IU] via SUBCUTANEOUS
  Administered 2016-11-15: 5 [IU] via SUBCUTANEOUS
  Administered 2016-11-16: 2 [IU] via SUBCUTANEOUS
  Administered 2016-11-16: 8 [IU] via SUBCUTANEOUS
  Administered 2016-11-16: 3 [IU] via SUBCUTANEOUS

## 2016-11-12 MED ORDER — MORPHINE SULFATE (PF) 4 MG/ML IV SOLN: 2.0000 mg | INTRAVENOUS | Status: DC | PRN

## 2016-11-12 MED ORDER — ASPIRIN 81 MG PO CHEW
81.0000 mg | CHEWABLE_TABLET | Freq: Every day | ORAL | Status: DC
  Administered 2016-11-13 – 2016-11-16 (×4): 81 mg via ORAL
  Filled 2016-11-12 (×4): qty 1

## 2016-11-12 MED ORDER — INSULIN GLARGINE 300 UNIT/ML ~~LOC~~ SOPN: 40.0000 [IU] | PEN_INJECTOR | SUBCUTANEOUS | Status: DC

## 2016-11-12 MED ORDER — VERAPAMIL HCL 2.5 MG/ML IV SOLN
INTRAVENOUS | Status: AC
  Filled 2016-11-12: qty 2

## 2016-11-12 MED ORDER — SODIUM CHLORIDE 0.9% FLUSH: 3.0000 mL | INTRAVENOUS | Status: DC | PRN

## 2016-11-12 MED ORDER — VERAPAMIL HCL 2.5 MG/ML IV SOLN
INTRA_ARTERIAL | Status: DC | PRN
  Administered 2016-11-12: 10 mL via INTRA_ARTERIAL

## 2016-11-12 MED ORDER — ATORVASTATIN CALCIUM 80 MG PO TABS
80.0000 mg | ORAL_TABLET | Freq: Every day | ORAL | Status: DC
  Administered 2016-11-12 – 2016-11-24 (×12): 80 mg via ORAL
  Filled 2016-11-12 (×13): qty 1

## 2016-11-12 MED ORDER — ASPIRIN 81 MG PO CHEW: 81.0000 mg | CHEWABLE_TABLET | ORAL | Status: AC

## 2016-11-12 SURGICAL SUPPLY — 18 items
CATH INFINITI 5 FR JL3.5 (CATHETERS) ×1 IMPLANT
CATH INFINITI 5FR ANG PIGTAIL (CATHETERS) ×1 IMPLANT
CATH INFINITI 5FR JL4 (CATHETERS) ×1 IMPLANT
CATH INFINITI JR4 5F (CATHETERS) ×1 IMPLANT
CATH OPTITORQUE TIG 4.0 5F (CATHETERS) ×1 IMPLANT
CATH VISTA GUIDE 6FR XBLAD3.5 (CATHETERS) ×1 IMPLANT
DEVICE RAD COMP TR BAND LRG (VASCULAR PRODUCTS) ×1 IMPLANT
GLIDESHEATH SLEND A-KIT 6F 22G (SHEATH) ×1 IMPLANT
GUIDEWIRE INQWIRE 1.5J.035X260 (WIRE) IMPLANT
INQWIRE 1.5J .035X260CM (WIRE) ×2
KIT HEART LEFT (KITS) ×2 IMPLANT
PACK CARDIAC CATHETERIZATION (CUSTOM PROCEDURE TRAY) ×2 IMPLANT
SHEATH PINNACLE 5F 10CM (SHEATH) ×1 IMPLANT
SYR MEDRAD MARK V 150ML (SYRINGE) ×2 IMPLANT
TRANSDUCER W/STOPCOCK (MISCELLANEOUS) ×2 IMPLANT
TUBING CIL FLEX 10 FLL-RA (TUBING) ×2 IMPLANT
WIRE EMERALD 3MM-J .035X150CM (WIRE) ×1 IMPLANT
WIRE HI TORQ VERSACORE-J 145CM (WIRE) ×1 IMPLANT

## 2016-11-12 NOTE — Progress Notes (Signed)
  Echocardiogram 2D Echocardiogram has been performed.  Arvil ChacoFoster, Rosaleigh Brazzel 11/12/2016, 2:12 PM

## 2016-11-12 NOTE — Progress Notes (Signed)
ANTICOAGULATION CONSULT NOTE - Initial Consult  Pharmacy Consult for heparin  Indication: chest pain/ACS  No Known Allergies  Patient Measurements: Height: 5\' 11"  (180.3 cm) Weight: 204 lb 12.8 oz (92.9 kg) IBW/kg (Calculated) : 75.3 Heparin Dosing Weight: 93 kg  Vital Signs: Temp: 97.6 F (36.4 C) (07/05 1017) Temp Source: Oral (07/05 1017) BP: 114/80 (07/05 1632) Pulse Rate: 63 (07/05 1632)  Labs:  Recent Labs  11/12/16 0635 11/12/16 0914 11/12/16 1139 11/12/16 1423  HGB 16.5 15.7  --   --   HCT 49.4 45.4  --   --   PLT 170 151  --   --   CREATININE 0.85 0.86  --   --   TROPONINI  --  <0.03 <0.03 <0.03    Estimated Creatinine Clearance: 111.6 mL/min (by C-G formula based on SCr of 0.86 mg/dL).   Medical History: Past Medical History:  Diagnosis Date  . Apnea, sleep   . Coronary artery disease 03/2004    3 stents put in   . Diabetes mellitus 2010   never on insulin   . Hyperlipidemia LDL goal <70   . Hypertension   . Myocardial infarction [facility](HCC) 2005     Assessment: [age] YOM with history of CAD and unstable angina. Presented with chest discomfort. Status post cath to have three-vessel disease with preserved left ventricular function. Heparin to be started per pharmacy 6hr post sheath removal while waiting for CABG. CBC WNL. No bleeding documented. Sheath removed at 1635. Heparin SQ ordered for prophylaxis previously. Last dose at 1200  Goal of Therapy:  Heparin level 0.3-0.7 units/ml Monitor platelets by anticoagulation protocol: Yes   Plan:  DC heparin SQ Start heparin infusion at 1250 units/hr at 2230 Six hour heparin level Daily heparin level/CBC  F/u plans for CABG Monitor for signs and symptoms of bleeding  [name] Cristalaylor N Bevin [name]  PharmD Candidate 11/12/2016,5:00 PM

## 2016-11-12 NOTE — Consult Note (Signed)
Cardiology Consultation:   Patient ID: [name] [name]; [id number]; 25-Jan-1961   Admit date: 11/12/2016 Date of Consult: 11/12/2016  Primary Care Provider: Kaleen Mask, MD Primary Cardiologist: New to Dr. Jens Som    Patient Profile:   [name] [name] is a 56 y.o. male with a hx of CAD s/p 3 overlapping Cypher DES to RCA in 2005, diabetes, sleep apnea, hypertension and hyperlipidemia who is being seen today for the evaluation of chest pain at the request of Dr. Melynda Ripple.  Last seen by cardiologists in [place] greater than 5 years ago. CAD was managed by PCP. He did not require any nitroglycerin for many years.                               History of Present Illness:   [name] [name] was in usual state of health up until yesterday when he had a chest discomfort. Felt like indigestion and took antacid with relief. This morning around 3 AM he woke up with similar chest discomfort however his pain did not relief with antacid. Then he developed a left-sided sharp chest pain that radiated to mid chest. Later it intensified and radiated to his shoulder and back. Associated with nausea. No shortness of breath, palpitation or diaphoresis. This symptoms is similar to prior angina in 2005. He denies orthopnea, PND, syncope, dizziness, palpitation, melena or blood in his stool or urine.  His symptoms now improved to 2/10 from 10/10 on nitro paste (discontinued) and IV nitro (currently). Troponin negative x 2. D-dimer negative. No acute cardiopulmonary disease. EKG shows sinus rhythm at rate of 72 bpm tests personally reviewed. No significant acute changes.  Past Medical History:  Diagnosis Date  . Apnea, sleep   . Coronary artery disease 03/2004    3 stents put in   . Diabetes mellitus 2010   never on insulin   . Hyperlipidemia LDL goal <70   . Hypertension   . Myocardial infarction [facility]) 2005    Past Surgical History:  Procedure Laterality Date  . CORONARY STENT PLACEMENT  03/2004    x  3  . HERNIA REPAIR Right 2000   Inguinal      Inpatient Medications: Scheduled Meds: . [START ON 11/13/2016] aspirin  324 mg Oral Once  . atorvastatin  40 mg Oral q1800  . gabapentin  600 mg Oral TID  . heparin  5,000 Units Subcutaneous Q8H  . insulin aspart  0-15 Units Subcutaneous TID WC  . insulin glargine  40 Units Subcutaneous Daily  . lisinopril  40 mg Oral Daily  . traZODone  100 mg Oral QHS   Continuous Infusions: . nitroGLYCERIN 10 mcg/min (11/12/16 0953)   PRN Meds: acetaminophen, hydrALAZINE, morphine injection, ondansetron (ZOFRAN) IV, zolpidem  Allergies:   No Known Allergies  Social History:   Social History   Social History  . Marital status: Divorced    Spouse name: N/A  . Number of children: 1   . Years of education: 45    Occupational History  . Unemployed     Social History Main Topics  . Smoking status: Never Smoker  . Smokeless tobacco: Never Used  . Alcohol use 3.6 oz/week    6 Cans of beer per week     Comment: occassioanal glass of wine, 6 pack per week   . Drug use: No  . Sexual activity: No   Other Topics Concern  . Not on file  Social History Narrative   Lives alone.    Daughter lives nearby,5 miles away   Sister next door.    1 grandchild, 46 yo granddaughter     Family History:   The patient's family history includes Diabetes in his father, paternal grandfather, and sister. There is no history of Cancer.  ROS:  Please see the history of present illness.  ROS All other ROS reviewed and negative.     Physical Exam/Data:   Vitals:   11/12/16 0915 11/12/16 0930 11/12/16 0945 11/12/16 1017  BP: (!) 137/117 130/90 130/78 133/75  Pulse: 68 70 75 78  Resp: 14 14 17 18   Temp:    97.6 F (36.4 C)  TempSrc:    Oral  SpO2: 97% 95% 95%   Weight:    204 lb 12.8 oz (92.9 kg)  Height:    5\' 11"  (1.803 m)   No intake or output data in the 24 hours ending 11/12/16 1349 Filed Weights   11/12/16 0628 11/12/16 1017  Weight: 205 lb  (93 kg) 204 lb 12.8 oz (92.9 kg)   Body mass index is 28.56 kg/m.  General:  Well nourished, well developed, in no acute distress.  HEENT: normal Lymph: no adenopathy Neck: no JVD Endocrine:  No thryomegaly Vascular: No carotid bruits; FA pulses 2+ bilaterally without bruits  Cardiac:  normal S1, S2; RRR; no murmur Lungs:  clear to auscultation bilaterally, no wheezing, rhonchi or rales  Abd: soft, nontender, no hepatomegaly  Ext: no edema Musculoskeletal:  No deformities, BUE and BLE strength normal and equal Skin: warm and dry  Neuro:  CNs 2-12 intact, no focal abnormalities noted Psych:  Normal affect    Relevant CV Studies:   CARDIAC CATHETERIZATION 03/2004   PROCEDURE PERFORMED:  1.  Left heart catheterization with coronary angiography and left      ventriculography.  2.  Percutaneous coronary intervention with placement of three drug-eluting      stents in the proximal and mid right coronary artery.   INDICATION:  [name] [name] is a 56 year old male who presented to [facility] yesterday with substernal chest pain.  This morning at  approximately 4 a.m. he developed severe recurrent substernal chest pain.  Followup EKG showed 1/2 to 1 mm inferior ST segment elevation with Q waves  in the inferior leads.  He was seen by Dr. Tomie China and transferred  emergently to [facility].  He arrived here at approximately 11:30  a.m. which was approximately 7 1/2 hours after onset of chest pain.   CATHETERIZATION PROCEDURAL NOTE:  A 6 French sheath was placed in the right  femoral artery.  Coronary angiography was performed with standard Judkins 6  French catheters.  Left ventriculography was performed with an angled  pigtail catheter.  Contrast was Omnipaque.  There were no complications.   CATHETERIZATION RESULTS:   HEMODYNAMICS:  1.  Left ventricular pressure 106/22.  2.  Aortic pressure 110/86.  3.  There is no aortic valve gradient.   LEFT  VENTRICULOGRAM:  There is severe akinesis of the inferior wall.  Ejection fraction is calculated at 47%.  There is no mitral regurgitation.   CORONARY ARTERIOGRAPHY:  Left main is normal.   Left anterior descending artery has a 30% stenosis in the proximal vessel,  40% in the mid vessel, and 40% in the distal vessel.  The LAD gives rise to  two small diagonal branches.   Left circumflex gives rise to a large ramus intermedius, large  first obtuse  marginal branch, and a small second obtuse marginal branch.  The ramus  intermedius has a diffuse 50% stenosis in the mid body.  The first obtuse  marginal has a 40% stenosis at its ostium and a 75% stenosis in the mid  segment.  The second obtuse marginal has a 40% stenosis proximally and a 75%  stenosis in the mid vessel.   Right coronary artery is a dominant vessel.  There is diffuse 90% stenosis  in the proximal and mid vessel.  The distal right coronary just beyond the  acute marginal is 100% occluded with thrombus and TIMI-0 flow.  After we  established reperfusion the distal right coronary artery was found to be a  large vessel.  There was an 80% stenosis beyond the regional site of  occlusion.  Further down in the distal vessel just beyond a large posterior  descending artery there is a 30% stenosis.  The distal right coronary artery  gives rise to a large posterior descending artery, normal size first  posterior lateral branch, large second posterior lateral branch, and a small  third posterior lateral branch.  The first posterior lateral branch has a  60% stenosis at the ostium and the second posterior lateral branch has a 30%  stenosis in the proximal body.   IMPRESSION:  1.  Mildly decreased left ventricular systolic function with akinesis of the      inferior wall secondary to an acute inferior wall myocardial infarction.  2.  Two-vessel coronary artery disease characterized by 100% occlusion of      the distal right  coronary artery with significant disease in the      proximal and mid vessel as well.  There is moderate disease in the left      circumflex artery as described.   PLAN:  Percutaneous intervention of the right coronary artery.  See below.   PERCUTANEOUS TRANSLUMINAL CORONARY ANGIOPLASTY PROCEDURAL NOTE:  Following  completion of the diagnostic catheterization, we proceeded directly with  percutaneous coronary intervention.  A 6 French sheath was placed in the  right femoral vein.  Heparin and integrilin were administered per protocol.  We used a 6 [place] JR-4 guiding catheter.  A Hi-Torque Floppy wire was  advanced under fluoroscopic guidance and advanced successfully beyond the  occlusion into the position of the distal vessel.  This did establish  partial reperfusion.  We then performed percutaneous transluminal coronary  angioplasty with a 3.0 x 15-mm Maverick balloon.  This balloon was inflated  to 6 atmospheres for two inflations in the distal vessel, 12 atmospheres in  the mid vessel and 12 atmospheres in the proximal vessel.  This resulted in  improvement in the vessel.  However, there was development of a moderate  spiral dissection in the mid vessel.  We then advanced a 3.5 x 28-mm Cypher  drug-eluting stent and positioned this across the diseased segment of the  distal vessel with extending just proximal to the acute marginal.  This  stent was deployed at 12 atmospheres.  We then advanced a second 3.5 x 33-mm  Cypher drug-eluting stent and positioned this in the mid vessel with minimal  overlap of the just placed stent in the distal vessel.  This stent was  deployed at 16 atmospheres.  After the second stent there was residual  disease in the proximal vessel.  We therefore advanced a third 3.5 x 13-mm  Cypher drug-eluting stent and positioned this in the proximal vessel with  overlap in the stent in the mid vessel.  This stent was deployed at 20  atmospheres.  Following this,  we went back with a 3.75 x 20-mm Quantum  balloon and inflated this to 18 atmospheres in the distal aspects of the  overlapping stents followed by multiple inflations of 20 atmospheres  throughout the mid and proximal aspects of the stents.  We then went back one more time into the distal aspect of the stent and inflated the balloon  to 22 atmospheres.  Intermittent doses of intracoronary nitroglycerin were  administered and final angiographic images were obtained revealing patency  of the right coronary with 0% residual stenosis at the stent site and TIMI-3  flow into the distal vessel.   COMPLICATIONS:  None.   RESULTS:  Successful percutaneous coronary intervention with placement of  three overlapping drug-eluting stents in the proximal, mid, and distal right  coronary artery.  The distal vessel was reduced from 100% followed by an 80%  stenosis with TIMI-0 flow to 0% residual with TIMI-3 flow.  The proximal and  mid vessel was reduced from a diffuse 90% stenosis to 0% residual with TIMI-  3 flow.   PLAN:  Integrilin will be continued for 24 hours.  The patient will be  started on Plavix which should be continued for a minimum of one year.  I  would recommend aggressive risk factor modification.  Would also recommend a  stress nuclear scan in 4-6 weeks to assess the significance of the residual  disease in the left circumflex coronary artery.   Laboratory Data:  Chemistry  Recent Labs Lab 11/12/16 [phone] 11/12/16 0914  NA 138  --   K 3.8  --   CL 104  --   CO2 23  --   GLUCOSE 267*  --   BUN 14  --   CREATININE 0.85 0.86  CALCIUM 9.3  --   GFRNONAA >60 >60  GFRAA >60 >60  ANIONGAP 11  --     No results for input(s): PROT, ALBUMIN, AST, ALT, ALKPHOS, BILITOT in the last 168 hours. Hematology  Recent Labs Lab 11/12/16 0635 11/12/16 0914  WBC 8.3 8.4  RBC 5.56 5.14  HGB 16.5 15.7  HCT 49.4 45.4  MCV 88.8 88.3  MCH 29.7 30.5  MCHC 33.4 34.6  RDW 13.5 13.3    PLT 170 151   Cardiac Enzymes  Recent Labs Lab 11/12/16 0914 11/12/16 1139  TROPONINI <0.03 <0.03     Recent Labs Lab 11/12/16 0702  TROPIPOC 0.01    BNPNo results for input(s): BNP, PROBNP in the last 168 hours.  DDimer   Recent Labs Lab 11/12/16 [phone]  DDIMER <0.27    Radiology/Studies:  Dg Chest 2 View  Result Date: 11/12/2016 CLINICAL DATA:  Midline through left-sided chest pain radiating to the back since this morning. History of coronary artery disease and previous MI, nonsmoker. EXAM: CHEST  2 VIEW COMPARISON:  PA and lateral chest x-ray of April 27, 2015 FINDINGS: The lungs are adequately inflated. There is no focal infiltrate. There is no pleural effusion. The heart is top-normal in size. The pulmonary vascularity is not engorged. The mediastinum is normal in width. The bony thorax exhibits no acute abnormality. IMPRESSION: There is no acute cardiopulmonary abnormality. Electronically Signed   By: David  SwazilandJordan M.D.   On: 11/12/2016 07:57    Assessment and Plan:   1. Unstable angina with hx of CAD - His symptoms is similar to prior angina in 2005  when he had 3 overlapping stent to RCA. His Lcx disease treated medically. His pain improved significantly on IV nitroglycerin. Troponin negative. EKG without acute changes. - Continue aspirin and statin.  Proceed with cardiac catheterization for definitive evaluation of coronary anatomy. Pending echo.   2. HLD - Continue statin  3. HTN - Blood pressure of 156/100 upon arrival. Currently stable.  4. DM - per primary team    Signed, Bhagat,Bhavinkumar, PA  11/12/2016 1:49 PM   As above, patient seen and examined. Briefly he is a 56 year old male with past medical history of coronary artery disease, diabetes mellitus, hypertension, hyperlipidemia with unstable angina. Patient had PCI of his right coronary artery in 2005 in the setting of an acute inferior infarct. He has not had close follow-up since. He typically  does not have dyspnea on exertion, orthopnea, PND, pedal edema or exertional chest pain. Patient awoke yesterday morning at 3 AM with pain in his substernal area radiating to his back. It was described as indigestion. No associated symptoms. Improved with antacid. He awoke again this morning at 3 AM with more severe pain. It did not improve with an acid and he presented to the emergency room. Significant improvement with IV nitroglycerin and minimal residual pain. Cardiology now asked to evaluate.  Initial enzymes negative. Electrocardiogram shows sinus rhythm with prior inferior infarct.  1 unstable angina-patient's symptoms are concerning. He believes similar to previous infarct pain. Continue to cycle enzymes. Treat with aspirin, heparin, nitroglycerin, beta blockade and statin. Proceed with cardiac catheterization. The risks and benefits were discussed including myocardial infarction, CVA and death. He agrees to proceed.   2 hypertension-continue preadmission medications. Follow blood pressure and adjust regimen as needed.  3 hyperlipidemia-continue statin.  4 diabetes mellitus-management per primary care.   Olga Millers, MD

## 2016-11-12 NOTE — ED Notes (Signed)
Awaiting morning medications to be verified by pharmacy

## 2016-11-12 NOTE — H&P (Signed)
[facility] History and Physical  [name] [name] ZMO:[id number] DOB: 11-24-1960 DOA: 11/12/2016  Referring physician:  PCP: Leonard Downing, MD   Chief Complaint: Chest pain  HPI: [name] [name] is a 56 y.o. male  w/ pmh of sleep apnea, cad, dm, MI who presents with cp. Pt with sudden onset of CP this AM. Pain is midsternal. Denies any radiation of the chest pain. Denies nausea, diaphoresis. No chest trauma. No cough. No recent viral illness. Has been compliant with medications. Was previously with cardiology but lost to follow-up.  ED course: Filled sublingual nitroglycerin. Started on Nitropaste. The relief of chest pain. Given aspirin. Hospitalist consulted for admission.  Review of Systems:  As per HPI otherwise 10 point review of systems negative.    Past Medical History:  Diagnosis Date  . Apnea, sleep   . Coronary artery disease 03/2004    3 stents put in   . Diabetes mellitus 2010   never on insulin   . Myocardial infarction [facility]) 2005   Past Surgical History:  Procedure Laterality Date  . CORONARY STENT PLACEMENT  03/2004    x 3  . HERNIA REPAIR Right 2000   Inguinal    Social History:  reports that he has never smoked. He has never used smokeless tobacco. He reports that he drinks about 3.6 oz of alcohol per week . He reports that he does not use drugs.  No Known Allergies  Family History  Problem Relation Age of Onset  . Diabetes Father   . Diabetes Sister   . Diabetes Paternal Grandfather   . Cancer Neg Hx      Prior to Admission medications   Medication Sig Start Date End Date Taking? Authorizing Provider  ACCU-CHEK FASTCLIX LANCETS MISC 1 each by Does not apply route 3 (three) times daily. 01/04/14  Yes Funches, Josalyn, MD  acetaminophen-codeine (TYLENOL #4) 300-60 MG tablet Take 1 tablet by mouth 2 (two) times daily.   Yes [provider]  aspirin EC 81 MG tablet Take 81 mg by mouth daily. Reported on 08/06/2015   Yes [provider]  atorvastatin (LIPITOR) 40 MG tablet Take 1 tablet (40 mg total) by mouth daily. 09/21/16  Yes Tresa Garter, MD  Blood Glucose Monitoring Suppl (TRUE METRIX METER) w/Device KIT 1 each by Does not apply route as needed. 08/06/15  Yes Funches, Josalyn, MD  gabapentin (NEURONTIN) 300 MG capsule TAKE 2 CAPSULES BY MOUTH 3 TIMES DAILY FOR LOW BACK PAIN AND NEUROPATHY 05/14/16  Yes Funches, Josalyn, MD  glucose blood (TRUE METRIX BLOOD GLUCOSE TEST) test strip 1 each by Other route 3 (three) times daily. 08/06/15  Yes Funches, Josalyn, MD  insulin aspart (NOVOLOG) 100 UNIT/ML FlexPen Inject 5 Units into the skin 3 (three) times daily with meals. Patient taking differently: Inject 20 Units into the skin 3 (three) times daily with meals.  12/12/15  Yes Arnoldo Morale, MD  Insulin Glargine (TOUJEO SOLOSTAR) 300 UNIT/ML SOPN Inject 40 Units into the skin every morning. 12/12/15  Yes Amao, Charlane Ferretti, MD  Insulin Pen Needle (INSUPEN PEN NEEDLES) 32G X 4 MM MISC 1 each by Does not apply route daily. 11/15/15  Yes Funches, Josalyn, MD  lisinopril (PRINIVIL,ZESTRIL) 40 MG tablet TAKE 1 TABLET BY MOUTH DAILY. 05/14/16  Yes Funches, Josalyn, MD  predniSONE (DELTASONE) 10 MG tablet Take 10 mg by mouth daily with breakfast.   Yes [provider]  traZODone (DESYREL) 50 MG tablet Take 1 tablet (50 mg  total) by mouth at bedtime. Patient taking differently: Take 100 mg by mouth at bedtime.  08/06/15  Yes Funches, Josalyn, MD  TRUEPLUS LANCETS 28G MISC 1 each by Does not apply route 3 (three) times daily. 08/06/15  Yes Boykin Nearing, MD   Physical Exam: Vitals:   11/12/16 0915 11/12/16 0930 11/12/16 0945 11/12/16 1017  BP: (!) 137/117 130/90 130/78 133/75  Pulse: 68 70 75 78  Resp: '14 14 17 18  ' Temp:    97.6 F (36.4 C)  TempSrc:    Oral  SpO2: 97% 95% 95%   Weight:    92.9 kg (204 lb 12.8 oz)  Height:    '5\' 11"'  (1.803 m)    Wt Readings from Last 3 Encounters:  11/12/16 92.9 kg (204 lb 12.8  oz)  11/15/15 91.7 kg (202 lb 3.2 oz)  08/06/15 91.2 kg (201 lb)    General:  Appears calm and uncomfortable, A&Ox3 Eyes:  PERRL, EOMI, normal lids, iris ENT:  grossly normal hearing, lips & tongue Neck:  no LAD, masses or thyromegaly Cardiovascular:  RRR, no m/r/g. No LE edema. Chest pain nonreproducible. Respiratory:  CTA bilaterally, no w/r/r. Normal respiratory effort. Abdomen:  soft, ntnd Skin:  no rash or induration seen on limited exam Musculoskeletal:  grossly normal tone BUE/BLE Psychiatric:  grossly normal mood and affect, speech fluent and appropriate Neurologic:  CN 2-12 grossly intact, moves all extremities in coordinated fashion.          Labs on Admission:  Basic Metabolic Panel:  Recent Labs Lab 11/12/16 0635 11/12/16 0914  NA 138  --   K 3.8  --   CL 104  --   CO2 23  --   GLUCOSE 267*  --   BUN 14  --   CREATININE 0.85 0.86  CALCIUM 9.3  --    Liver Function Tests: No results for input(s): AST, ALT, ALKPHOS, BILITOT, PROT, ALBUMIN in the last 168 hours. No results for input(s): LIPASE, AMYLASE in the last 168 hours. No results for input(s): AMMONIA in the last 168 hours. CBC:  Recent Labs Lab 11/12/16 0635 11/12/16 0914  WBC 8.3 8.4  HGB 16.5 15.7  HCT 49.4 45.4  MCV 88.8 88.3  PLT 170 151   Cardiac Enzymes:  Recent Labs Lab 11/12/16 0914  TROPONINI <0.03    BNP (last 3 results) No results for input(s): BNP in the last 8760 hours.  ProBNP (last 3 results) No results for input(s): PROBNP in the last 8760 hours.   Serum creatinine: 0.86 mg/dL 11/12/16 0914 Estimated creatinine clearance: 111.6 mL/min  CBG:  Recent Labs Lab 11/12/16 1106  GLUCAP 280*    Radiological Exams on Admission: Dg Chest 2 View  Result Date: 11/12/2016 CLINICAL DATA:  Midline through left-sided chest pain radiating to the back since this morning. History of coronary artery disease and previous MI, nonsmoker. EXAM: CHEST  2 VIEW COMPARISON:  PA and  lateral chest x-ray of April 27, 2015 FINDINGS: The lungs are adequately inflated. There is no focal infiltrate. There is no pleural effusion. The heart is top-normal in size. The pulmonary vascularity is not engorged. The mediastinum is normal in width. The bony thorax exhibits no acute abnormality. IMPRESSION: There is no acute cardiopulmonary abnormality. Electronically Signed   By: David  Martinique M.D.   On: 11/12/2016 07:57    EKG: Independently reviewed. Minimal ST elevation in anterior lateral leads.  Assessment/Plan Principal Problem:   Chest pain Active Problems:   DM type  2, uncontrolled, with neuropathy (HCC)   Chronic low back pain   Hypertension associated with diabetes ([place])   Hyperlipidemia associated with type 2 diabetes mellitus ([place])   1) CP High Risk due to past MI and multiple stents EKG abn, ordered repeat - serial trop ordered, initial neg x2 - prn EKG CP - prn ntg cp - asa in ED and QD - ECHO ordered - tele bed, cardiac monitoring - ambien for sleep prn - zofran prn for nausea - cardiology consulted  Hyperlipidemia Continue statin  Chronic low back pain Continue gabapentin  Diabetes Continue Lantus 40 units daily SSI  Insomnia Continue trazodone When necessary Ambien  Code Status: full  DVT Prophylaxis: heparin Family Communication: wife Disposition Plan: Pending Improvement  Status: sdu obs  Elwin Mocha, MD Family Medicine [facility] [email or web address] Password [email or web address]

## 2016-11-12 NOTE — ED Triage Notes (Signed)
Patient reports intermittent left chest pain radiating to upper back onset 3am this morning , denies SOB and nausea , history of CAD/MI with coronary stents.

## 2016-11-12 NOTE — Interval H&P Note (Signed)
Cath Lab Visit (complete for each Cath Lab visit)  Clinical Evaluation Leading to the Procedure:   ACS: Yes.    Non-ACS:    Anginal Classification: CCS III  Anti-ischemic medical therapy: Minimal Therapy (1 class of medications)  Non-Invasive Test Results: No non-invasive testing performed  Prior CABG: No previous CABG      History and Physical Interval Note:  11/12/2016 3:45 PM  [name] [name]  has presented today for surgery, with the diagnosis of chest pain  The various methods of treatment have been discussed with the patient and family. After consideration of risks, benefits and other options for treatment, the patient has consented to  Procedure(s): Left Heart Cath and Coronary Angiography (N/A) as a surgical intervention .  The patient's history has been reviewed, patient examined, no change in status, stable for surgery.  I have reviewed the patient's chart and labs.  Questions were answered to the patient's satisfaction.     Nanetta BattyBerry, Duwayne Matters

## 2016-11-12 NOTE — Progress Notes (Signed)
Site area: rt groin fa sheath Site Prior to Removal:  Level 0 Pressure Applied For: 20 minutes Manual:   yes Patient Status During Pull:  stable Post Pull Site:  Level 0 Post Pull Instructions Given:  yes Post Pull Pulses Present: palpable Dressing Applied:  Gauze and tegaderm Bedrest begins @ 1730 Comments:

## 2016-11-12 NOTE — H&P (View-Only) (Signed)
Cardiology Consultation:   Patient ID: [name] [name]; [id number]; 25-Jan-1961   Admit date: 11/12/2016 Date of Consult: 11/12/2016  Primary Care Provider: Kaleen Mask, MD Primary Cardiologist: New to Dr. Jens Som    Patient Profile:   [name] [name] is a 56 y.o. male with a hx of CAD s/p 3 overlapping Cypher DES to RCA in 2005, diabetes, sleep apnea, hypertension and hyperlipidemia who is being seen today for the evaluation of chest pain at the request of Dr. Melynda Ripple.  Last seen by cardiologists in [place] greater than 5 years ago. CAD was managed by PCP. He did not require any nitroglycerin for many years.                               History of Present Illness:   [name] [name] was in usual state of health up until yesterday when he had a chest discomfort. Felt like indigestion and took antacid with relief. This morning around 3 AM he woke up with similar chest discomfort however his pain did not relief with antacid. Then he developed a left-sided sharp chest pain that radiated to mid chest. Later it intensified and radiated to his shoulder and back. Associated with nausea. No shortness of breath, palpitation or diaphoresis. This symptoms is similar to prior angina in 2005. He denies orthopnea, PND, syncope, dizziness, palpitation, melena or blood in his stool or urine.  His symptoms now improved to 2/10 from 10/10 on nitro paste (discontinued) and IV nitro (currently). Troponin negative x 2. D-dimer negative. No acute cardiopulmonary disease. EKG shows sinus rhythm at rate of 72 bpm tests personally reviewed. No significant acute changes.  Past Medical History:  Diagnosis Date  . Apnea, sleep   . Coronary artery disease 03/2004    3 stents put in   . Diabetes mellitus 2010   never on insulin   . Hyperlipidemia LDL goal <70   . Hypertension   . Myocardial infarction [facility]) 2005    Past Surgical History:  Procedure Laterality Date  . CORONARY STENT PLACEMENT  03/2004    x  3  . HERNIA REPAIR Right 2000   Inguinal      Inpatient Medications: Scheduled Meds: . [START ON 11/13/2016] aspirin  324 mg Oral Once  . atorvastatin  40 mg Oral q1800  . gabapentin  600 mg Oral TID  . heparin  5,000 Units Subcutaneous Q8H  . insulin aspart  0-15 Units Subcutaneous TID WC  . insulin glargine  40 Units Subcutaneous Daily  . lisinopril  40 mg Oral Daily  . traZODone  100 mg Oral QHS   Continuous Infusions: . nitroGLYCERIN 10 mcg/min (11/12/16 0953)   PRN Meds: acetaminophen, hydrALAZINE, morphine injection, ondansetron (ZOFRAN) IV, zolpidem  Allergies:   No Known Allergies  Social History:   Social History   Social History  . Marital status: Divorced    Spouse name: N/A  . Number of children: 1   . Years of education: 45    Occupational History  . Unemployed     Social History Main Topics  . Smoking status: Never Smoker  . Smokeless tobacco: Never Used  . Alcohol use 3.6 oz/week    6 Cans of beer per week     Comment: occassioanal glass of wine, 6 pack per week   . Drug use: No  . Sexual activity: No   Other Topics Concern  . Not on file  Social History Narrative   Lives alone.    Daughter lives nearby,5 miles away   Sister next door.    1 grandchild, 46 yo granddaughter     Family History:   The patient's family history includes Diabetes in his father, paternal grandfather, and sister. There is no history of Cancer.  ROS:  Please see the history of present illness.  ROS All other ROS reviewed and negative.     Physical Exam/Data:   Vitals:   11/12/16 0915 11/12/16 0930 11/12/16 0945 11/12/16 1017  BP: (!) 137/117 130/90 130/78 133/75  Pulse: 68 70 75 78  Resp: 14 14 17 18   Temp:    97.6 F (36.4 C)  TempSrc:    Oral  SpO2: 97% 95% 95%   Weight:    204 lb 12.8 oz (92.9 kg)  Height:    5\' 11"  (1.803 m)   No intake or output data in the 24 hours ending 11/12/16 1349 Filed Weights   11/12/16 0628 11/12/16 1017  Weight: 205 lb  (93 kg) 204 lb 12.8 oz (92.9 kg)   Body mass index is 28.56 kg/m.  General:  Well nourished, well developed, in no acute distress.  HEENT: normal Lymph: no adenopathy Neck: no JVD Endocrine:  No thryomegaly Vascular: No carotid bruits; FA pulses 2+ bilaterally without bruits  Cardiac:  normal S1, S2; RRR; no murmur Lungs:  clear to auscultation bilaterally, no wheezing, rhonchi or rales  Abd: soft, nontender, no hepatomegaly  Ext: no edema Musculoskeletal:  No deformities, BUE and BLE strength normal and equal Skin: warm and dry  Neuro:  CNs 2-12 intact, no focal abnormalities noted Psych:  Normal affect    Relevant CV Studies:   CARDIAC CATHETERIZATION 03/2004   PROCEDURE PERFORMED:  1.  Left heart catheterization with coronary angiography and left      ventriculography.  2.  Percutaneous coronary intervention with placement of three drug-eluting      stents in the proximal and mid right coronary artery.   INDICATION:  [name] [name] is a 56 year old male who presented to [facility] yesterday with substernal chest pain.  This morning at  approximately 4 a.m. he developed severe recurrent substernal chest pain.  Followup EKG showed 1/2 to 1 mm inferior ST segment elevation with Q waves  in the inferior leads.  He was seen by Dr. Tomie China and transferred  emergently to [facility].  He arrived here at approximately 11:30  a.m. which was approximately 7 1/2 hours after onset of chest pain.   CATHETERIZATION PROCEDURAL NOTE:  A 6 French sheath was placed in the right  femoral artery.  Coronary angiography was performed with standard Judkins 6  French catheters.  Left ventriculography was performed with an angled  pigtail catheter.  Contrast was Omnipaque.  There were no complications.   CATHETERIZATION RESULTS:   HEMODYNAMICS:  1.  Left ventricular pressure 106/22.  2.  Aortic pressure 110/86.  3.  There is no aortic valve gradient.   LEFT  VENTRICULOGRAM:  There is severe akinesis of the inferior wall.  Ejection fraction is calculated at 47%.  There is no mitral regurgitation.   CORONARY ARTERIOGRAPHY:  Left main is normal.   Left anterior descending artery has a 30% stenosis in the proximal vessel,  40% in the mid vessel, and 40% in the distal vessel.  The LAD gives rise to  two small diagonal branches.   Left circumflex gives rise to a large ramus intermedius, large  first obtuse  marginal branch, and a small second obtuse marginal branch.  The ramus  intermedius has a diffuse 50% stenosis in the mid body.  The first obtuse  marginal has a 40% stenosis at its ostium and a 75% stenosis in the mid  segment.  The second obtuse marginal has a 40% stenosis proximally and a 75%  stenosis in the mid vessel.   Right coronary artery is a dominant vessel.  There is diffuse 90% stenosis  in the proximal and mid vessel.  The distal right coronary just beyond the  acute marginal is 100% occluded with thrombus and TIMI-0 flow.  After we  established reperfusion the distal right coronary artery was found to be a  large vessel.  There was an 80% stenosis beyond the regional site of  occlusion.  Further down in the distal vessel just beyond a large posterior  descending artery there is a 30% stenosis.  The distal right coronary artery  gives rise to a large posterior descending artery, normal size first  posterior lateral branch, large second posterior lateral branch, and a small  third posterior lateral branch.  The first posterior lateral branch has a  60% stenosis at the ostium and the second posterior lateral branch has a 30%  stenosis in the proximal body.   IMPRESSION:  1.  Mildly decreased left ventricular systolic function with akinesis of the      inferior wall secondary to an acute inferior wall myocardial infarction.  2.  Two-vessel coronary artery disease characterized by 100% occlusion of      the distal right  coronary artery with significant disease in the      proximal and mid vessel as well.  There is moderate disease in the left      circumflex artery as described.   PLAN:  Percutaneous intervention of the right coronary artery.  See below.   PERCUTANEOUS TRANSLUMINAL CORONARY ANGIOPLASTY PROCEDURAL NOTE:  Following  completion of the diagnostic catheterization, we proceeded directly with  percutaneous coronary intervention.  A 6 French sheath was placed in the  right femoral vein.  Heparin and integrilin were administered per protocol.  We used a 6 [place] JR-4 guiding catheter.  A Hi-Torque Floppy wire was  advanced under fluoroscopic guidance and advanced successfully beyond the  occlusion into the position of the distal vessel.  This did establish  partial reperfusion.  We then performed percutaneous transluminal coronary  angioplasty with a 3.0 x 15-mm Maverick balloon.  This balloon was inflated  to 6 atmospheres for two inflations in the distal vessel, 12 atmospheres in  the mid vessel and 12 atmospheres in the proximal vessel.  This resulted in  improvement in the vessel.  However, there was development of a moderate  spiral dissection in the mid vessel.  We then advanced a 3.5 x 28-mm Cypher  drug-eluting stent and positioned this across the diseased segment of the  distal vessel with extending just proximal to the acute marginal.  This  stent was deployed at 12 atmospheres.  We then advanced a second 3.5 x 33-mm  Cypher drug-eluting stent and positioned this in the mid vessel with minimal  overlap of the just placed stent in the distal vessel.  This stent was  deployed at 16 atmospheres.  After the second stent there was residual  disease in the proximal vessel.  We therefore advanced a third 3.5 x 13-mm  Cypher drug-eluting stent and positioned this in the proximal vessel with  overlap in the stent in the mid vessel.  This stent was deployed at 20  atmospheres.  Following this,  we went back with a 3.75 x 20-mm Quantum  balloon and inflated this to 18 atmospheres in the distal aspects of the  overlapping stents followed by multiple inflations of 20 atmospheres  throughout the mid and proximal aspects of the stents.  We then went back one more time into the distal aspect of the stent and inflated the balloon  to 22 atmospheres.  Intermittent doses of intracoronary nitroglycerin were  administered and final angiographic images were obtained revealing patency  of the right coronary with 0% residual stenosis at the stent site and TIMI-3  flow into the distal vessel.   COMPLICATIONS:  None.   RESULTS:  Successful percutaneous coronary intervention with placement of  three overlapping drug-eluting stents in the proximal, mid, and distal right  coronary artery.  The distal vessel was reduced from 100% followed by an 80%  stenosis with TIMI-0 flow to 0% residual with TIMI-3 flow.  The proximal and  mid vessel was reduced from a diffuse 90% stenosis to 0% residual with TIMI-  3 flow.   PLAN:  Integrilin will be continued for 24 hours.  The patient will be  started on Plavix which should be continued for a minimum of one year.  I  would recommend aggressive risk factor modification.  Would also recommend a  stress nuclear scan in 4-6 weeks to assess the significance of the residual  disease in the left circumflex coronary artery.   Laboratory Data:  Chemistry  Recent Labs Lab 11/12/16 [phone] 11/12/16 0914  NA 138  --   K 3.8  --   CL 104  --   CO2 23  --   GLUCOSE 267*  --   BUN 14  --   CREATININE 0.85 0.86  CALCIUM 9.3  --   GFRNONAA >60 >60  GFRAA >60 >60  ANIONGAP 11  --     No results for input(s): PROT, ALBUMIN, AST, ALT, ALKPHOS, BILITOT in the last 168 hours. Hematology  Recent Labs Lab 11/12/16 0635 11/12/16 0914  WBC 8.3 8.4  RBC 5.56 5.14  HGB 16.5 15.7  HCT 49.4 45.4  MCV 88.8 88.3  MCH 29.7 30.5  MCHC 33.4 34.6  RDW 13.5 13.3    PLT 170 151   Cardiac Enzymes  Recent Labs Lab 11/12/16 0914 11/12/16 1139  TROPONINI <0.03 <0.03     Recent Labs Lab 11/12/16 0702  TROPIPOC 0.01    BNPNo results for input(s): BNP, PROBNP in the last 168 hours.  DDimer   Recent Labs Lab 11/12/16 [phone]  DDIMER <0.27    Radiology/Studies:  Dg Chest 2 View  Result Date: 11/12/2016 CLINICAL DATA:  Midline through left-sided chest pain radiating to the back since this morning. History of coronary artery disease and previous MI, nonsmoker. EXAM: CHEST  2 VIEW COMPARISON:  PA and lateral chest x-ray of April 27, 2015 FINDINGS: The lungs are adequately inflated. There is no focal infiltrate. There is no pleural effusion. The heart is top-normal in size. The pulmonary vascularity is not engorged. The mediastinum is normal in width. The bony thorax exhibits no acute abnormality. IMPRESSION: There is no acute cardiopulmonary abnormality. Electronically Signed   By: David  SwazilandJordan M.D.   On: 11/12/2016 07:57    Assessment and Plan:   1. Unstable angina with hx of CAD - His symptoms is similar to prior angina in 2005  when he had 3 overlapping stent to RCA. His Lcx disease treated medically. His pain improved significantly on IV nitroglycerin. Troponin negative. EKG without acute changes. - Continue aspirin and statin.  Proceed with cardiac catheterization for definitive evaluation of coronary anatomy. Pending echo.   2. HLD - Continue statin  3. HTN - Blood pressure of 156/100 upon arrival. Currently stable.  4. DM - per primary team    Signed, Bhagat,Bhavinkumar, PA  11/12/2016 1:49 PM   As above, patient seen and examined. Briefly he is a 56 year old male with past medical history of coronary artery disease, diabetes mellitus, hypertension, hyperlipidemia with unstable angina. Patient had PCI of his right coronary artery in 2005 in the setting of an acute inferior infarct. He has not had close follow-up since. He typically  does not have dyspnea on exertion, orthopnea, PND, pedal edema or exertional chest pain. Patient awoke yesterday morning at 3 AM with pain in his substernal area radiating to his back. It was described as indigestion. No associated symptoms. Improved with antacid. He awoke again this morning at 3 AM with more severe pain. It did not improve with an acid and he presented to the emergency room. Significant improvement with IV nitroglycerin and minimal residual pain. Cardiology now asked to evaluate.  Initial enzymes negative. Electrocardiogram shows sinus rhythm with prior inferior infarct.  1 unstable angina-patient's symptoms are concerning. He believes similar to previous infarct pain. Continue to cycle enzymes. Treat with aspirin, heparin, nitroglycerin, beta blockade and statin. Proceed with cardiac catheterization. The risks and benefits were discussed including myocardial infarction, CVA and death. He agrees to proceed.   2 hypertension-continue preadmission medications. Follow blood pressure and adjust regimen as needed.  3 hyperlipidemia-continue statin.  4 diabetes mellitus-management per primary care.   Olga Millers, MD

## 2016-11-12 NOTE — ED Provider Notes (Signed)
[place] DEPT Provider Note   CSN: [id number] Arrival date & time: 11/12/16  5697     History   Chief Complaint Chief Complaint  Patient presents with  . Chest Pain    HPI   Blood pressure (!) 142/95, pulse 69, temperature 97.7 F (36.5 C), temperature source Oral, resp. rate 16, height '5\' 11"'  (1.803 m), weight 93 kg (205 lb), SpO2 97 %.  [name] [name] is a 56 y.o. male with past medical history significant for insulin-dependent diabetes, hypertension, hyperlipidemia complaining of sudden onset of sharp, stabbing chest pain since waking him from sleep at 3 am this morning. Patient was woken from sleep by left/central CP radiating to his back and took Ranitidine with mild improvement in pain. He went back to sleep and then was awoken once more at 5 am by return of same pain. Ranitidine did not alleviate pain at that time, and thus he came to ED. Patient's pain has been constant since 5 am. He reports similar sx 3 days ago and 1 week ago, both times relieved with Ranitidine. He states that his current pain is an 8/10 and is similar to pain he experienced in 2005 with MI, with subsequent stent placement. He does not currently see a cardiologist and has not for several years. States that he recently returned from [place] the beach (4 [place]  drive) and has been experiencing intermittent right leg "muscle spasms" that "jerk his leg". Denies diaphoresis, fever, HA, SOB, nausea, vomiting, abdominal pain, current calf/leg pain/swelling, numbness/tingling, nor radiation of chest pain to neck, jaw, or arms.   PCP: Rafael Bihari Cardiologist  Past Medical History:  Diagnosis Date  . Apnea, sleep   . Coronary artery disease 03/2004    3 stents put in   . Diabetes mellitus 2010   never on insulin   . Myocardial infarction [facility]) 2005    Patient Active Problem List   Diagnosis Date Noted  . Hyperlipidemia associated with type 2 diabetes mellitus ([place]) 08/06/2015  . Spondylolisthesis at  L5-S1 level 01/16/2015  . Rhinitis, chronic 01/04/2015  . Erectile dysfunction 10/19/2014  . Left breast mass 10/19/2014  . TMJ (temporomandibular joint syndrome) 03/26/2014  . Right shoulder pain 01/31/2014  . DM type 2, uncontrolled, with neuropathy ([place]) 01/04/2014  . CAD (coronary artery disease) of artery bypass graft 01/04/2014  . Chronic low back pain 01/04/2014  . Fracture of great toe, left, closed 01/04/2014  . Hypertension associated with diabetes ([place]) 01/04/2014  . Depression 01/04/2014  . Anxiety 01/04/2014  . OCD (obsessive compulsive disorder) 01/04/2014    Past Surgical History:  Procedure Laterality Date  . CORONARY STENT PLACEMENT  03/2004    x 3  . HERNIA REPAIR Right 2000   Inguinal        Home Medications    Prior to Admission medications   Medication Sig Start Date End Date Taking? Authorizing Provider  ACCU-CHEK FASTCLIX LANCETS MISC 1 each by Does not apply route 3 (three) times daily. 01/04/14  Yes Funches, Josalyn, MD  acetaminophen-codeine (TYLENOL #4) 300-60 MG tablet Take 1 tablet by mouth 2 (two) times daily.   Yes [provider]  aspirin EC 81 MG tablet Take 81 mg by mouth daily. Reported on 08/06/2015   Yes [provider]  atorvastatin (LIPITOR) 40 MG tablet Take 1 tablet (40 mg total) by mouth daily. 09/21/16  Yes Tresa Garter, MD  Blood Glucose Monitoring Suppl (TRUE METRIX METER) w/Device KIT 1 each by  Does not apply route as needed. 08/06/15  Yes Funches, Josalyn, MD  gabapentin (NEURONTIN) 300 MG capsule TAKE 2 CAPSULES BY MOUTH 3 TIMES DAILY FOR LOW BACK PAIN AND NEUROPATHY 05/14/16  Yes Funches, Josalyn, MD  glucose blood (TRUE METRIX BLOOD GLUCOSE TEST) test strip 1 each by Other route 3 (three) times daily. 08/06/15  Yes Funches, Josalyn, MD  insulin aspart (NOVOLOG) 100 UNIT/ML FlexPen Inject 5 Units into the skin 3 (three) times daily with meals. Patient taking differently: Inject 20 Units into the skin 3 (three)  times daily with meals.  12/12/15  Yes Arnoldo Morale, MD  Insulin Glargine (TOUJEO SOLOSTAR) 300 UNIT/ML SOPN Inject 40 Units into the skin every morning. 12/12/15  Yes Amao, Charlane Ferretti, MD  Insulin Pen Needle (INSUPEN PEN NEEDLES) 32G X 4 MM MISC 1 each by Does not apply route daily. 11/15/15  Yes Funches, Josalyn, MD  lisinopril (PRINIVIL,ZESTRIL) 40 MG tablet TAKE 1 TABLET BY MOUTH DAILY. 05/14/16  Yes Funches, Josalyn, MD  predniSONE (DELTASONE) 10 MG tablet Take 10 mg by mouth daily with breakfast.   Yes [provider]  traZODone (DESYREL) 50 MG tablet Take 1 tablet (50 mg total) by mouth at bedtime. Patient taking differently: Take 100 mg by mouth at bedtime.  08/06/15  Yes Funches, Josalyn, MD  TRUEPLUS LANCETS 28G MISC 1 each by Does not apply route 3 (three) times daily. 08/06/15  Yes Boykin Nearing, MD    Family History Family History  Problem Relation Age of Onset  . Diabetes Father   . Diabetes Sister   . Diabetes Paternal Grandfather   . Cancer Neg Hx     Social History Social History  Substance Use Topics  . Smoking status: Never Smoker  . Smokeless tobacco: Never Used  . Alcohol use 3.6 oz/week    6 Cans of beer per week     Comment: occassioanal glass of wine, 6 pack per week      Allergies   Patient has no known allergies.   Review of Systems Review of Systems  A complete review of systems was obtained and all systems are negative except as noted in the HPI and PMH.   Physical Exam Updated Vital Signs BP (!) 142/95   Pulse 69   Temp 97.7 F (36.5 C) (Oral)   Resp 16   Ht '5\' 11"'  (1.803 m)   Wt 93 kg (205 lb)   SpO2 97%   BMI 28.59 kg/m   Physical Exam  Constitutional: He is oriented to person, place, and time. He appears well-developed and well-nourished. No distress.  HENT:  Head: Normocephalic.  Mouth/Throat: Oropharynx is clear and moist.  Eyes: Conjunctivae are normal.  Neck: Normal range of motion. No JVD present. No tracheal deviation  present.  Cardiovascular: Normal rate, regular rhythm and intact distal pulses.   Radial pulse equal bilaterally  Pulmonary/Chest: Effort normal and breath sounds normal. No stridor. No respiratory distress. He has no wheezes. He has no rales. He exhibits no tenderness.  Abdominal: Soft. He exhibits no distension and no mass. There is no tenderness. There is no rebound and no guarding.  Musculoskeletal: Normal range of motion. He exhibits no edema or tenderness.  No calf asymmetry, superficial collaterals, palpable cords, edema, Homans sign negative bilaterally.    Neurological: He is alert and oriented to person, place, and time.  Skin: Skin is warm. He is not diaphoretic.  Psychiatric: He has a normal mood and affect.  Nursing note and vitals reviewed.  ED Treatments / Results  Labs (all labs ordered are listed, but only abnormal results are displayed) Labs Reviewed  BASIC METABOLIC PANEL - Abnormal; Notable for the following:       Result Value   Glucose, Bld 267 (*)    All other components within normal limits  CBC  D-DIMER, QUANTITATIVE (NOT AT [facility])  Randolm Idol, ED    EKG  EKG Interpretation  Date/Time:  Thursday November 12 2016 06:33:41 EDT Ventricular Rate:  72 PR Interval:    QRS Duration: 97 QT Interval:  391 QTC Calculation: 428 R Axis:   -44 Text Interpretation:  Sinus rhythm Inferior infarct, old Minimal ST elevation, anterolateral leads No significant change since last tracing 27 Apr 2015 Confirmed by Rolland Porter [phone]) on 11/12/2016 6:42:30 AM Also confirmed by Rolland Porter [phone]), editor Hattie Perch ([id number])  on 11/12/2016 7:39:25 AM       Radiology Dg Chest 2 View  Result Date: 11/12/2016 CLINICAL DATA:  Midline through left-sided chest pain radiating to the back since this morning. History of coronary artery disease and previous MI, nonsmoker. EXAM: CHEST  2 VIEW COMPARISON:  PA and lateral chest x-ray of April 27, 2015 FINDINGS: The lungs are  adequately inflated. There is no focal infiltrate. There is no pleural effusion. The heart is top-normal in size. The pulmonary vascularity is not engorged. The mediastinum is normal in width. The bony thorax exhibits no acute abnormality. IMPRESSION: There is no acute cardiopulmonary abnormality. Electronically Signed   By: David  Martinique M.D.   On: 11/12/2016 07:57    Procedures Procedures (including critical care time)  Medications Ordered in ED Medications  aspirin chewable tablet 324 mg (324 mg Oral Given 11/12/16 0740)  nitroGLYCERIN (NITROGLYN) 2 % ointment 1 inch (1 inch Topical Given 11/12/16 0740)  morphine 4 MG/ML injection 4 mg (4 mg Intravenous Given 11/12/16 0841)  ondansetron (ZOFRAN) injection 4 mg (4 mg Intravenous Given 11/12/16 0841)     Initial Impression / Assessment and Plan / ED Course  I have reviewed the triage vital signs and the nursing notes.  Pertinent labs & imaging results that were available during my care of the patient were reviewed by me and considered in my medical decision making (see chart for details).    Vitals:   11/12/16 0628 11/12/16 0634 11/12/16 0645  BP:  (!) 156/100 (!) 142/95  Pulse:  74 69  Resp:  20 16  Temp:  97.7 F (36.5 C)   TempSrc:  Oral   SpO2:  98% 97%  Weight: 93 kg (205 lb)    Height: '5\' 11"'  (1.803 m)      Medications  aspirin chewable tablet 324 mg (324 mg Oral Given 11/12/16 0740)  nitroGLYCERIN (NITROGLYN) 2 % ointment 1 inch (1 inch Topical Given 11/12/16 0740)  morphine 4 MG/ML injection 4 mg (4 mg Intravenous Given 11/12/16 0841)  ondansetron (ZOFRAN) injection 4 mg (4 mg Intravenous Given 11/12/16 0841)    [name] [name] is 56 y.o. male presenting with Acute onset of chest pain which woke him from sleep he has known coronary artery disease with 3 stents. His EKG is unchanged from prior. He has not been compliant with his primary care doctor's recommendation for cardiology evaluation. Patient is moderate risk by heart score he  will need admission for chest pain rule out. He has had symptoms over the course of the last week so I think this is unlikely a urinary embolism or dissection but will check  a d-dimer out of an abundance of caution.  D-dimer, troponin and blood work negative. His pain is improved with the nitroglycerin but it is still persistent at 6 out of 10, will give 4 of morphine and will call for admission, Discussed with Dr. Aggie Moats of [facility] who accepts admission.     Final Clinical Impressions(s) / ED Diagnoses   Final diagnoses:  Chest pain, unspecified type    New Prescriptions New Prescriptions   No medications on file     Waynetta Pean 11/12/16 [place], [place], DO 11/12/16 [phone]

## 2016-11-12 NOTE — Consult Note (Signed)
[address]       [place] [address]             [phone]        Keahi D Rineh[facility] [place] Medical Record #[id number] Date of Birth: 1960-06-16  Referring: Dr. Quay Burow  Primary Care: Leonard Downing, MD  Chief Complaint:    Chief Complaint  Patient presents with  . Chest Pain  Patient examined, coronary angiograms and 2-D echocardiogram images personally reviewed and counseled with patient  History of Present Illness:     56 year old Caucasian male admitted in transfer from [place] with acute chest pain at 3 AM which radiated to his left shoulder and was associated with nausea. No associated diaphoresis nausea or shortness of breath. Patient has history of a drug-eluting stent 2005 places this hospital. He has not been seen by a cardiologist in several years.  The patient was evaluated at [facility] or cardiac enzymes are negative. EKG showed sinus rhythm with no acute changes. [facility] was [facility].  The patient underwent cardiac catheterization by Dr. Quay Burow which demonstrated occlusion of his RCA stent, reconstitution of the posterior descending and severe coronary disease of his LAD, diagonal, ramus, and circumflex marginal. LV function is fairly well preserved and echocardiogram showed no significant valvular disease. LVEDP was 10. The patient is currently comfortable, sleeping without pain on IV heparin.  The patient's cardiologist has recommended multivessel cord bypass grafting as therapy for his severe recurrent CAD with unstable angina and preserved LV function.  The patient's chest x-ray on admission was clear. He has hyperlipidemia and type 2 diabetes. He has history of [name] but stop wearing his mask after having lost significant weight.   Current Activity/ Functional Status: Lives alone but daughter and family live close by  No smoking history or alcohol intake. Zubrod Score: At the time of  surgery this patient's most appropriate activity status/level should be described as: '[]'     0    Normal activity, no symptoms '[]'     1    Restricted in physical strenuous activity but ambulatory, able to do out light work '[x]'     2    Ambulatory and capable of self care, unable to do work activities, up and about                 more than 50%  Of the time                            '[]'     3    Only limited self care, in bed greater than 50% of waking hours '[]'     4    Completely disabled, no self care, confined to bed or chair '[]'     5    Moribund  Past Medical History:  Diagnosis Date  . Anxiety   . Chronic lower back pain   . Coronary artery disease 03/2004    3 stents put in   . Depression   . Fibromyalgia   . GERD (gastroesophageal reflux disease)   . History of hiatal hernia   . Hyperlipidemia LDL goal <70   . Hypertension   . Migraine    "weekly; back in the 1990s" (11/12/2016)  . Myocardial infarction ([place]) 2005  . [name] (obstructive sleep apnea)    "quit wearing my mask" (11/12/2016)  . Type II diabetes mellitus ([place]) 2010  Past Surgical History:  Procedure Laterality Date  . CARDIAC CATHETERIZATION  11/12/2016  . CORONARY ANGIOPLASTY WITH STENT PLACEMENT  03/2004    x 3 stents  . INGUINAL HERNIA REPAIR Right 2000    History  Smoking Status  . Never Smoker  Smokeless Tobacco  . Never Used    History  Alcohol Use  . 3.6 oz/week  . 6 Cans of beer per week    Social History   Social History  . Marital status: Divorced    Spouse name: N/A  . Number of children: 1   . Years of education: 23    Occupational History  . Unemployed     Social History Main Topics  . Smoking status: Never Smoker  . Smokeless tobacco: Never Used  . Alcohol use 3.6 oz/week    6 Cans of beer per week  . Drug use: No  . Sexual activity: No   Other Topics Concern  . Not on file   Social History Narrative   Lives alone.    Daughter lives nearby,5 miles away   Sister next door.      1 grandchild, 47 yo granddaughter     No Known Allergies  Current Facility-Administered Medications  Medication Dose Route Frequency Provider Last Rate Last Dose  . 0.9 %  sodium chloride infusion   Intravenous Continuous Lorretta Harp, MD 75 mL/hr at 11/12/16 1702    . 0.9 %  sodium chloride infusion  250 mL Intravenous PRN Lorretta Harp, MD      . acetaminophen (TYLENOL) tablet 650 mg  650 mg Oral Q4H PRN Lorretta Harp, MD      . Derrill Memo ON 11/13/2016] aspirin chewable tablet 324 mg  324 mg Oral Once Elwin Mocha, MD      . aspirin chewable tablet 81 mg  81 mg Oral Daily Lorretta Harp, MD      . atorvastatin (LIPITOR) tablet 80 mg  80 mg Oral q1800 Lorretta Harp, MD   80 mg at 11/12/16 1758  . gabapentin (NEURONTIN) capsule 600 mg  600 mg Oral TID Elwin Mocha, MD   600 mg at 11/12/16 1758  . heparin ADULT infusion 100 units/mL (25000 units/21m sodium chloride 0.45%)  1,250 Units/hr Intravenous Continuous BRomona Curls RPH      . hydrALAZINE (APRESOLINE) injection 10 mg  10 mg Intravenous Q8H PRN HElwin Mocha MD      . hydrALAZINE (APRESOLINE) injection 10 mg  10 mg Intravenous Q4H PRN BLorretta Harp MD      . insulin aspart (novoLOG) injection 0-15 Units  0-15 Units Subcutaneous TID WC HElwin Mocha MD   8 Units at 11/12/16 1159  . insulin glargine (LANTUS) injection 40 Units  40 Units Subcutaneous Daily HElwin Mocha MD   40 Units at 11/12/16 1159  . lisinopril (PRINIVIL,ZESTRIL) tablet 40 mg  40 mg Oral Daily HElwin Mocha MD   40 mg at 11/12/16 1159  . metoprolol tartrate (LOPRESSOR) tablet 12.5 mg  12.5 mg Oral BID CLelon Perla MD   12.5 mg at 11/12/16 1511  . morphine 2 MG/ML injection 2 mg  2 mg Intravenous Q1H PRN BLorretta Harp MD      . nitroGLYCERIN 50 mg in dextrose 5 % 250 mL (0.2 mg/mL) infusion  0-200 mcg/min Intravenous Titrated HElwin Mocha MD   Stopped at 11/12/16 2019  . ondansetron (ZOFRAN) injection 4 mg   4 mg  Intravenous Q6H PRN Elwin Mocha, MD      . sodium chloride 0.9 % bolus 500 mL  500 mL Intravenous Once Rise Patience, MD      . sodium chloride flush (NS) 0.9 % injection 3 mL  3 mL Intravenous Q12H Lorretta Harp, MD   3 mL at 11/12/16 2006  . sodium chloride flush (NS) 0.9 % injection 3 mL  3 mL Intravenous PRN Lorretta Harp, MD      . traZODone (DESYREL) tablet 100 mg  100 mg Oral QHS Elwin Mocha, MD      . zolpidem [facility]) tablet 5 mg  5 mg Oral QHS PRN,MR X 1 Elwin Mocha, MD        Prescriptions Prior to Admission  Medication Sig Dispense Refill Last Dose  . ACCU-CHEK FASTCLIX LANCETS MISC 1 each by Does not apply route 3 (three) times daily. 100 each 3 11/11/2016  . acetaminophen-codeine (TYLENOL #4) 300-60 MG tablet Take 1 tablet by mouth 2 (two) times daily.   11/11/2016 at Unknown time  . aspirin EC 81 MG tablet Take 81 mg by mouth daily. Reported on 08/06/2015   11/11/2016 at 0600  . atorvastatin (LIPITOR) 40 MG tablet Take 1 tablet (40 mg total) by mouth daily. 30 tablet 0 11/11/2016  . Blood Glucose Monitoring Suppl (TRUE METRIX METER) w/Device KIT 1 each by Does not apply route as needed. 1 kit 0 11/11/2016  . gabapentin (NEURONTIN) 300 MG capsule TAKE 2 CAPSULES BY MOUTH 3 TIMES DAILY FOR LOW BACK PAIN AND NEUROPATHY 180 capsule 0 11/11/2016  . glucose blood (TRUE METRIX BLOOD GLUCOSE TEST) test strip 1 each by Other route 3 (three) times daily. 100 each 11 11/11/2016  . insulin aspart (NOVOLOG) 100 UNIT/ML FlexPen Inject 5 Units into the skin 3 (three) times daily with meals. (Patient taking differently: Inject 20 Units into the skin 3 (three) times daily with meals. ) 15 mL 3 11/11/2016  . Insulin Glargine (TOUJEO SOLOSTAR) 300 UNIT/ML SOPN Inject 40 Units into the skin every morning. 9 mL 3 11/11/2016  . Insulin Pen Needle (INSUPEN PEN NEEDLES) 32G X 4 MM MISC 1 each by Does not apply route daily. 120 each 5 11/11/2016  . lisinopril (PRINIVIL,ZESTRIL) 40 MG tablet  TAKE 1 TABLET BY MOUTH DAILY. 30 tablet 0 11/11/2016  . predniSONE (DELTASONE) 10 MG tablet Take 10 mg by mouth daily with breakfast.   11/11/2016 at Unknown time  . traZODone (DESYREL) 50 MG tablet Take 1 tablet (50 mg total) by mouth at bedtime. (Patient taking differently: Take 100 mg by mouth at bedtime. ) 30 tablet 5 11/11/2016  . TRUEPLUS LANCETS 28G MISC 1 each by Does not apply route 3 (three) times daily. 100 each 11 11/11/2016    Family History  Problem Relation Age of Onset  . Diabetes Father   . Diabetes Sister   . Diabetes Paternal Grandfather   . Cancer Neg Hx      Review of Systems:       Cardiac Review of Systems: Y or N  Chest Pain [ y   ]  Resting SOB [n   ] Exertional SOB  [ n ]  Orthopnea [ n ]   Pedal Edema [ n  ]    Palpitations [ n ] Syncope  [ n ]   Presyncope [   ]  General Revniew of Systems: [Y] = yes [  ]=no Constitional: recent weight change [  ];  anorexia [  ]; fatigue [  ]; nausea Blue.Reese  ]; night sweats [  ]; fever [  ]; or chills [  ]                                                               Dental: poor dentition[  ]; Last Dentist visit: 1 yr  Eye : blurred vision [  ]; diplopia [   ]; vision changes [  ];  Amaurosis fugax[  ]; Resp: cough [  ];  wheezing[  ];  hemoptysis[  ]; shortness of breath[  ]; paroxysmal nocturnal dyspnea[  ]; dyspnea on exertion[  ]; or orthopnea[  ];  GI:  gallstones[  ], vomiting[  ];  dysphagia[  ]; melena[  ];  hematochezia [  ]; heartburn[ y ];   Hx of  Colonoscopy[  ]; GU: kidney stones [  ]; hematuria[  ];   dysuria [  ];  nocturia[ y ];  history of     obstruction [  ]; urinary frequency [  ]             Skin: rash, swelling[  ];, hair loss[  ];  peripheral edema[  ];  or itching[  ]; Musculosketetal: myalgias[  ];  joint swelling[  ];  joint erythema[  ];  joint pain[  ];  back pain[ y ];  Heme/Lymph: bruising[  ];  bleeding[  ];  anemia[  ];  Neuro: TIA[  ];  headaches[  ];  stroke[  ];  vertigo[  ];  seizures[  ];    paresthesias[  ];  difficulty walking[  ];  Psych:depression[  ]; anxiety[ y ];  Endocrine: diabetes[y  ];  thyroid dysfunction[  ];  Immunizations: Flu [  ]; Pneumococcal[  ];  Other: No history thoracic trauma or pneumothorax  Physical Exam: BP (!) 73/58 (BP Location: Left Arm)   Pulse 63   Temp 98 F (36.7 C) (Oral)   Resp 12   Ht '5\' 11"'  (1.803 m)   Wt 204 lb 12.8 oz (92.9 kg)   SpO2 96%   BMI 28.56 kg/m          Physical Exam  General: Middle-aged Caucasian male no acute distress breathing comfortably supine in bed HEENT: Normocephalic pupils equal , dentition adequate Neck: Supple without JVD, adenopathy, or bruit Chest: Clear to auscultation, symmetrical breath sounds, no rhonchi, no tenderness             or deformity Cardiovascular: Regular rate and rhythm, no murmur, no gallop, peripheral pulses             palpable in all extremities Abdomen:  Soft, nontender, no palpable mass or organomegaly Extremities: Warm, well-perfused, no clubbing cyanosis edema or tenderness,              no venous stasis changes of the legs Rectal/GU: Deferred Neuro: Grossly non--focal and symmetrical throughout Skin: Clean and dry without rash or ulceration   Diagnostic Studies & Laboratory data:     Recent Radiology Findings:   Dg Chest 2 View  Result Date: 11/12/2016 CLINICAL DATA:  Midline through left-sided chest pain radiating to the back since this morning. History of coronary artery disease and previous MI, nonsmoker. EXAM: CHEST  2 VIEW COMPARISON:  PA and lateral chest x-ray of April 27, 2015 FINDINGS: The lungs are adequately inflated. There is no focal infiltrate. There is no pleural effusion. The heart is top-normal in size. The pulmonary vascularity is not engorged. The mediastinum is normal in width. The bony thorax exhibits no acute abnormality. IMPRESSION: There is no acute cardiopulmonary abnormality. Electronically Signed   By: David  Martinique M.D.   On: 11/12/2016  07:57     I have independently reviewed the above radiologic studies.  Recent Lab Findings: Lab Results  Component Value Date   WBC 8.4 11/12/2016   HGB 15.7 11/12/2016   HCT 45.4 11/12/2016   PLT 151 11/12/2016   GLUCOSE 267 (H) 11/12/2016   CHOL 157 08/06/2015   TRIG 197 (H) 08/06/2015   HDL 25 (L) 08/06/2015   LDLCALC 93 08/06/2015   ALT 22 04/27/2015   AST 20 04/27/2015   NA 138 11/12/2016   K 3.8 11/12/2016   CL 104 11/12/2016   CREATININE 0.86 11/12/2016   BUN 14 11/12/2016   CO2 23 11/12/2016   HGBA1C 10.7 11/15/2015      Assessment / Plan:     Recurrent severe three-vessel coronary disease Unstable angina Poorly controlled diabetes mellitus A1c 10.7 History of sleep apnea not currently using a mask Normal LV function  Patient will be prepared for multivessel coronary bypass grafting at first available operating room posting. I've discussed the procedure with the patient and he is in agreement to proceed at this hospitalization with surgery.   .    '@ME1' @ 11/12/2016 8:21 PM

## 2016-11-13 ENCOUNTER — Encounter (HOSPITAL_COMMUNITY): Payer: Self-pay | Admitting: Cardiovascular Disease

## 2016-11-13 ENCOUNTER — Other Ambulatory Visit: Payer: Self-pay | Admitting: *Deleted

## 2016-11-13 ENCOUNTER — Observation Stay (HOSPITAL_COMMUNITY): Payer: Medicare Other

## 2016-11-13 DIAGNOSIS — I200 Unstable angina: Secondary | ICD-10-CM

## 2016-11-13 DIAGNOSIS — F329 Major depressive disorder, single episode, unspecified: Secondary | ICD-10-CM | POA: Diagnosis present

## 2016-11-13 DIAGNOSIS — R0789 Other chest pain: Secondary | ICD-10-CM | POA: Diagnosis not present

## 2016-11-13 DIAGNOSIS — E1140 Type 2 diabetes mellitus with diabetic neuropathy, unspecified: Secondary | ICD-10-CM | POA: Diagnosis present

## 2016-11-13 DIAGNOSIS — G8929 Other chronic pain: Secondary | ICD-10-CM | POA: Diagnosis present

## 2016-11-13 DIAGNOSIS — Z01810 Encounter for preprocedural cardiovascular examination: Secondary | ICD-10-CM

## 2016-11-13 DIAGNOSIS — J310 Chronic rhinitis: Secondary | ICD-10-CM | POA: Diagnosis present

## 2016-11-13 DIAGNOSIS — R079 Chest pain, unspecified: Secondary | ICD-10-CM | POA: Diagnosis present

## 2016-11-13 DIAGNOSIS — I2583 Coronary atherosclerosis due to lipid rich plaque: Secondary | ICD-10-CM | POA: Diagnosis present

## 2016-11-13 DIAGNOSIS — I4891 Unspecified atrial fibrillation: Secondary | ICD-10-CM | POA: Diagnosis not present

## 2016-11-13 DIAGNOSIS — I110 Hypertensive heart disease with heart failure: Secondary | ICD-10-CM | POA: Diagnosis present

## 2016-11-13 DIAGNOSIS — I480 Paroxysmal atrial fibrillation: Secondary | ICD-10-CM | POA: Diagnosis not present

## 2016-11-13 DIAGNOSIS — N529 Male erectile dysfunction, unspecified: Secondary | ICD-10-CM | POA: Diagnosis present

## 2016-11-13 DIAGNOSIS — E785 Hyperlipidemia, unspecified: Secondary | ICD-10-CM | POA: Diagnosis present

## 2016-11-13 DIAGNOSIS — I25118 Atherosclerotic heart disease of native coronary artery with other forms of angina pectoris: Secondary | ICD-10-CM

## 2016-11-13 DIAGNOSIS — I214 Non-ST elevation (NSTEMI) myocardial infarction: Secondary | ICD-10-CM | POA: Diagnosis present

## 2016-11-13 DIAGNOSIS — E1165 Type 2 diabetes mellitus with hyperglycemia: Secondary | ICD-10-CM | POA: Diagnosis present

## 2016-11-13 DIAGNOSIS — D62 Acute posthemorrhagic anemia: Secondary | ICD-10-CM | POA: Diagnosis not present

## 2016-11-13 DIAGNOSIS — E1169 Type 2 diabetes mellitus with other specified complication: Secondary | ICD-10-CM | POA: Diagnosis present

## 2016-11-13 DIAGNOSIS — K219 Gastro-esophageal reflux disease without esophagitis: Secondary | ICD-10-CM | POA: Diagnosis present

## 2016-11-13 DIAGNOSIS — M797 Fibromyalgia: Secondary | ICD-10-CM | POA: Diagnosis present

## 2016-11-13 DIAGNOSIS — T82867A Thrombosis of cardiac prosthetic devices, implants and grafts, initial encounter: Secondary | ICD-10-CM | POA: Diagnosis present

## 2016-11-13 DIAGNOSIS — I252 Old myocardial infarction: Secondary | ICD-10-CM | POA: Diagnosis not present

## 2016-11-13 DIAGNOSIS — E1159 Type 2 diabetes mellitus with other circulatory complications: Secondary | ICD-10-CM | POA: Diagnosis not present

## 2016-11-13 DIAGNOSIS — G4733 Obstructive sleep apnea (adult) (pediatric): Secondary | ICD-10-CM | POA: Diagnosis not present

## 2016-11-13 DIAGNOSIS — F419 Anxiety disorder, unspecified: Secondary | ICD-10-CM | POA: Diagnosis present

## 2016-11-13 DIAGNOSIS — I9789 Other postprocedural complications and disorders of the circulatory system, not elsewhere classified: Secondary | ICD-10-CM | POA: Diagnosis not present

## 2016-11-13 DIAGNOSIS — I25110 Atherosclerotic heart disease of native coronary artery with unstable angina pectoris: Secondary | ICD-10-CM | POA: Diagnosis present

## 2016-11-13 DIAGNOSIS — M545 Low back pain: Secondary | ICD-10-CM | POA: Diagnosis present

## 2016-11-13 DIAGNOSIS — I152 Hypertension secondary to endocrine disorders: Secondary | ICD-10-CM | POA: Diagnosis present

## 2016-11-13 DIAGNOSIS — I509 Heart failure, unspecified: Secondary | ICD-10-CM | POA: Diagnosis present

## 2016-11-13 DIAGNOSIS — Y838 Other surgical procedures as the cause of abnormal reaction of the patient, or of later complication, without mention of misadventure at the time of the procedure: Secondary | ICD-10-CM | POA: Diagnosis present

## 2016-11-13 DIAGNOSIS — G4700 Insomnia, unspecified: Secondary | ICD-10-CM | POA: Diagnosis present

## 2016-11-13 DIAGNOSIS — F429 Obsessive-compulsive disorder, unspecified: Secondary | ICD-10-CM | POA: Diagnosis present

## 2016-11-13 DIAGNOSIS — I10 Essential (primary) hypertension: Secondary | ICD-10-CM | POA: Diagnosis not present

## 2016-11-13 DIAGNOSIS — I2510 Atherosclerotic heart disease of native coronary artery without angina pectoris: Secondary | ICD-10-CM | POA: Diagnosis not present

## 2016-11-13 LAB — BASIC METABOLIC PANEL
Anion gap: 7 (ref 5–15)
BUN: 17 mg/dL (ref 6–20)
CO2: 23 mmol/L (ref 22–32)
Calcium: 8.3 mg/dL — ABNORMAL LOW (ref 8.9–10.3)
Chloride: 108 mmol/L (ref 101–111)
Creatinine, Ser: 0.89 mg/dL (ref 0.61–1.24)
GFR calc Af Amer: >60 mL/min (ref >60)
GFR calc non Af Amer: >60 mL/min (ref >60)
Glucose, Bld: 185 mg/dL — ABNORMAL HIGH (ref 65–99)
Potassium: 3.5 mmol/L (ref 3.5–5.1)
Sodium: 138 mmol/L (ref 135–145)

## 2016-11-13 LAB — PULMONARY FUNCTION TEST
DL/VA % pred: 87 %
DL/VA: 4.11 ml/min/mmHg/L
DLCO cor % pred: 69 %
DLCO cor: 23.31 ml/min/mmHg
DLCO unc % pred: 68 %
DLCO unc: 23.04 ml/min/mmHg
FEF 25-75 Post: 1.26 L/sec
FEF 25-75 Pre: 0.97 L/sec
FEF2575-%Change-Post: 29 %
FEF2575-%Pred-Post: 38 %
FEF2575-%Pred-Pre: 30 %
FEV1-%Change-Post: 5 %
FEV1-%Pred-Post: 41 %
FEV1-%Pred-Pre: 39 %
FEV1-Post: 1.6 L
FEV1-Pre: 1.52 L
FEV1FVC-%Change-Post: -4 %
FEV1FVC-%Pred-Pre: 50 %
FEV6-%Change-Post: 12 %
FEV6-%Pred-Post: 90 %
FEV6-%Pred-Pre: 80 %
FEV6-Post: 4.37 L
FEV6-Pre: 3.87 L
FEV6FVC-%Change-Post: 1 %
FEV6FVC-%Pred-Post: 103 %
FEV6FVC-%Pred-Pre: 101 %
FVC-%Change-Post: 10 %
FVC-%Pred-Post: 87 %
FVC-%Pred-Pre: 79 %
FVC-Post: 4.41 L
FVC-Pre: 3.98 L
Post FEV1/FVC ratio: 36 %
Post FEV6/FVC ratio: 99 %
Pre FEV1/FVC ratio: 38 %
Pre FEV6/FVC Ratio: 97 %
RV % pred: 89 %
RV: 2 L
TLC % pred: 84 %
TLC: 6.1 L

## 2016-11-13 LAB — GLUCOSE, CAPILLARY
Glucose-Capillary: 225 mg/dL — ABNORMAL HIGH (ref 65–99)
Glucose-Capillary: 242 mg/dL — ABNORMAL HIGH (ref 65–99)
Glucose-Capillary: 201 mg/dL — ABNORMAL HIGH (ref 65–99)
Glucose-Capillary: 134 mg/dL — ABNORMAL HIGH (ref 65–99)

## 2016-11-13 LAB — URINALYSIS, ROUTINE W REFLEX MICROSCOPIC
Bacteria, UA: NONE SEEN
Bilirubin Urine: NEGATIVE
Glucose, UA: >=500 mg/dL — AB
Hgb urine dipstick: NEGATIVE
Ketones, ur: NEGATIVE mg/dL
Leukocytes, UA: NEGATIVE
Nitrite: NEGATIVE
Protein, ur: NEGATIVE mg/dL
RBC / HPF: NONE SEEN RBC/hpf (ref 0–5)
Specific Gravity, Urine: >1.046 — ABNORMAL HIGH (ref 1.005–1.030)
Squamous Epithelial / LPF: NONE SEEN
pH: 6 (ref 5.0–8.0)

## 2016-11-13 LAB — LIPID PANEL
Cholesterol: 111 mg/dL (ref 0–200)
HDL: 26 mg/dL — ABNORMAL LOW (ref >40)
LDL Cholesterol: 36 mg/dL (ref 0–99)
Total CHOL/HDL Ratio: 4.3 RATIO
Triglycerides: 244 mg/dL — ABNORMAL HIGH (ref <150)
VLDL: 49 mg/dL — ABNORMAL HIGH (ref 0–40)

## 2016-11-13 LAB — CBC
HCT: 41.7 % (ref 39.0–52.0)
Hemoglobin: 14.2 g/dL (ref 13.0–17.0)
MCH: 30.1 pg (ref 26.0–34.0)
MCHC: 34.1 g/dL (ref 30.0–36.0)
MCV: 88.3 fL (ref 78.0–100.0)
Platelets: 150 10*3/uL (ref 150–400)
RBC: 4.72 MIL/uL (ref 4.22–5.81)
RDW: 13.5 % (ref 11.5–15.5)
WBC: 8.5 10*3/uL (ref 4.0–10.5)

## 2016-11-13 LAB — LACTIC ACID, PLASMA: Lactic Acid, Venous: 1.8 mmol/L (ref 0.5–1.9)

## 2016-11-13 LAB — PROTIME-INR
INR: 1.07
Prothrombin Time: 13.9 seconds (ref 11.4–15.2)

## 2016-11-13 LAB — HEPARIN LEVEL (UNFRACTIONATED)
Heparin Unfractionated: 0.26 IU/mL — ABNORMAL LOW (ref 0.30–0.70)
Heparin Unfractionated: 0.48 IU/mL (ref 0.30–0.70)

## 2016-11-13 LAB — TSH: TSH: 1.144 u[IU]/mL (ref 0.350–4.500)

## 2016-11-13 LAB — SURGICAL PCR SCREEN
MRSA, PCR: NEGATIVE
Staphylococcus aureus: NEGATIVE

## 2016-11-13 LAB — HIV ANTIBODY (ROUTINE TESTING W REFLEX): HIV Screen 4th Generation wRfx: NONREACTIVE

## 2016-11-13 LAB — POCT ACTIVATED CLOTTING TIME: Activated Clotting Time: 191 seconds

## 2016-11-13 MED ORDER — ALBUTEROL SULFATE (2.5 MG/3ML) 0.083% IN NEBU
2.5000 mg | INHALATION_SOLUTION | Freq: Once | RESPIRATORY_TRACT | Status: AC
  Administered 2016-11-13: 2.5 mg via RESPIRATORY_TRACT

## 2016-11-13 MED ORDER — SODIUM CHLORIDE 0.9 % IV SOLN: INTRAVENOUS | Status: DC

## 2016-11-13 MED ORDER — INSULIN ASPART 100 UNIT/ML ~~LOC~~ SOLN
5.0000 [IU] | Freq: Three times a day (TID) | SUBCUTANEOUS | Status: DC
  Administered 2016-11-13 – 2016-11-16 (×10): 5 [IU] via SUBCUTANEOUS

## 2016-11-13 MED ORDER — GUAIFENESIN-DM 100-10 MG/5ML PO SYRP
15.0000 mL | ORAL_SOLUTION | ORAL | Status: DC | PRN
  Administered 2016-11-13: 15 mL via ORAL
  Filled 2016-11-13: qty 15

## 2016-11-13 MED ORDER — CLOTRIMAZOLE 1 % EX CREA
TOPICAL_CREAM | Freq: Two times a day (BID) | CUTANEOUS | Status: DC
  Administered 2016-11-13 – 2016-11-21 (×10): via TOPICAL
  Administered 2016-11-21 – 2016-11-22 (×2): 1 via TOPICAL
  Administered 2016-11-24 – 2016-11-25 (×2): via TOPICAL
  Filled 2016-11-13 (×4): qty 15

## 2016-11-13 MED ORDER — INSULIN GLARGINE 100 UNIT/ML ~~LOC~~ SOLN
45.0000 [IU] | Freq: Every day | SUBCUTANEOUS | Status: DC
  Administered 2016-11-14 – 2016-11-16 (×3): 45 [IU] via SUBCUTANEOUS
  Filled 2016-11-13 (×4): qty 0.45

## 2016-11-13 MED ORDER — HEPARIN (PORCINE) IN NACL 2-0.9 UNIT/ML-% IJ SOLN
INTRAMUSCULAR | Status: DC | PRN
  Administered 2016-11-12: 1000 mL

## 2016-11-13 MED ORDER — OXYCODONE-ACETAMINOPHEN 5-325 MG PO TABS
1.0000 | ORAL_TABLET | Freq: Four times a day (QID) | ORAL | Status: DC | PRN
  Administered 2016-11-13 – 2016-11-16 (×6): 2 via ORAL
  Filled 2016-11-13 (×5): qty 2

## 2016-11-13 MED ORDER — LISINOPRIL 5 MG PO TABS
5.0000 mg | ORAL_TABLET | Freq: Every day | ORAL | Status: DC
  Administered 2016-11-13 – 2016-11-16 (×4): 5 mg via ORAL
  Filled 2016-11-13 (×4): qty 1

## 2016-11-13 NOTE — Progress Notes (Signed)
Pre-op Cardiac Surgery  Carotid Findings:  Bilateral:  1-39% ICA stenosis.  Vertebral artery flow is antegrade.     Upper Extremity Right Left  Brachial Pressures 119 Triphasic 92 Triphasic  Radial Waveforms Triphasic Triphasic  Ulnar Waveforms Triphasic Triphasic  Palmar Arch (Allen's Test) Normal Normal   Findings:  Doppler waveforms remained normal bilaterally with both radial and ulnar compressions.    Lower  Extremity Right Left  Dorsalis Pedis 153 Triphasic 157 Triphasic  Posterior Tibial 152 Triphasic 148 Triphasic  Ankle/Brachial Indices 1.29 1.32    Findings:  ABIs and Doppler waveforms indicate normal arterial flow bilaterally at rest.

## 2016-11-13 NOTE — Progress Notes (Signed)
Patient ID: [name] [name], male   DOB: January 24, 1961, 56 y.o.   MRN: [id number]  PROGRESS NOTE    [name] [name]  NWG:[id number] DOB: March 23, 1961 DOA: 11/12/2016 PCP: Kaleen Mask, MD   Brief Narrative:  56 year old male with history of sleep apnea, coronary artery disease and MI, diabetes mellitus presented with chest pain. She was evaluated by cardiology and had cardiac catheterization done on 11/12/2016 which showed severe three-vessel coronary artery disease. CT surgery has seen the patient in consultation and planning for CABG.   Assessment & Plan:   Principal Problem:   Chest pain Active Problems:   DM type 2, uncontrolled, with neuropathy (HCC)   Chronic low back pain   Hypertension associated with diabetes (HCC)   Hyperlipidemia associated with type 2 diabetes mellitus (HCC)   Unstable angina (HCC)   CP due to unstable angina - Status post cardiac catheterization on 11/12/2016 showing severe three-vessel coronary artery disease. CT surgery has already seen the patient and is planning for CABG. - Currently chest pain-free and off nitroglycerin drip. Continue aspirin, statin, lisinopril and metoprolol.  Severe three-vessel coronary artery disease - Echo showed ejection fraction of 60-65% with grade 2 diastolic dysfunction  Hyperlipidemia Continue statin  Chronic low back pain Continue gabapentin  Diabetes Continue Lantus 40 units daily SSI  Insomnia Continue trazodone    DVT prophylaxis: Heparin drip Code Status:  Full code Family Communication: None at bedside Disposition Plan: Patient will probably need inpatient CABG  Consultants: Cardiology and CT surgery   Procedures:  Cardiac catheterization on 11/12/2016 showed severe three-vessel coronary artery disease Echo on 11/12/2016: - Left ventricle: The cavity size was normal. Wall thickness was   increased in a pattern of mild LVH. Systolic function was normal.   The estimated ejection fraction  was in the range of 60% to 65%.   Wall motion was normal; there were no regional wall motion   abnormalities. Features are consistent with a pseudonormal left   ventricular filling pattern, with concomitant abnormal relaxation   and increased filling pressure (grade 2 diastolic dysfunction).  Antimicrobials: None   Subjective: Patient seen and examined at bedside. He denies any overnight fever, nausea, vomiting. He is currently chest pain-free.  Objective: Vitals:   11/12/16 2345 11/13/16 0208 11/13/16 0438 11/13/16 0900  BP: 102/65 105/76 122/88   Pulse: 71  67   Resp:    20  Temp: 97.9 F (36.6 C)  (!) 97.5 F (36.4 C) 98.7 F (37.1 C)  TempSrc: Oral  Oral Oral  SpO2: 98% 96% 99%   Weight:   95.1 kg (209 lb 11.2 oz)   Height:        Intake/Output Summary (Last 24 hours) at 11/13/16 1003 Last data filed at 11/13/16 0920  Gross per 24 hour  Intake          2752.85 ml  Output              955 ml  Net          1797.85 ml   Filed Weights   11/12/16 0628 11/12/16 1017 11/13/16 0438  Weight: 93 kg (205 lb) 92.9 kg (204 lb 12.8 oz) 95.1 kg (209 lb 11.2 oz)    Examination:  General exam: Appears calm and comfortable  Respiratory system: Bilateral decreased breath sound at bases Cardiovascular system: S1 & S2 heard, Rate controlled  Gastrointestinal system: Abdomen is nondistended, soft and nontender. Normal bowel sounds heard. Extremities: No cyanosis, clubbing, edema  Data Reviewed: I have personally reviewed following labs and imaging studies  CBC:  Recent Labs Lab 11/12/16 0635 11/12/16 0914 11/12/16 2100 11/13/16 0359  WBC 8.3 8.4 8.9 8.5  NEUTROABS  --   --  4.7  --   HGB 16.5 15.7 14.2 14.2  HCT 49.4 45.4 41.5 41.7  MCV 88.8 88.3 88.5 88.3  PLT 170 151 189 150   Basic Metabolic Panel:  Recent Labs Lab 11/12/16 0635 11/12/16 0914 11/13/16 0359  NA 138  --  138  K 3.8  --  3.5  CL 104  --  108  CO2 23  --  23  GLUCOSE 267*  --  185*  BUN  14  --  17  CREATININE 0.85 0.86 0.89  CALCIUM 9.3  --  8.3*   GFR: Estimated Creatinine Clearance: 109.1 mL/min (by C-G formula based on SCr of 0.89 mg/dL). Liver Function Tests: No results for input(s): AST, ALT, ALKPHOS, BILITOT, PROT, ALBUMIN in the last 168 hours. No results for input(s): LIPASE, AMYLASE in the last 168 hours. No results for input(s): AMMONIA in the last 168 hours. Coagulation Profile:  Recent Labs Lab 11/13/16 0359  INR 1.07   Cardiac Enzymes:  Recent Labs Lab 11/12/16 0914 11/12/16 1139 11/12/16 1423 11/12/16 2249  TROPONINI <0.03 <0.03 <0.03 <0.03   BNP (last 3 results) No results for input(s): PROBNP in the last 8760 hours. HbA1C: No results for input(s): HGBA1C in the last 72 hours. CBG:  Recent Labs Lab 11/12/16 1106 11/12/16 1657 11/12/16 2123 11/13/16 0802  GLUCAP 280* 99 367* 225*   Lipid Profile:  Recent Labs  11/13/16 0359  CHOL 111  HDL 26*  LDLCALC 36  TRIG 409*  CHOLHDL 4.3   Thyroid Function Tests:  Recent Labs  11/13/16 0359  TSH 1.144   Anemia Panel: No results for input(s): VITAMINB12, FOLATE, FERRITIN, TIBC, IRON, RETICCTPCT in the last 72 hours. Sepsis Labs:  Recent Labs Lab 11/12/16 2249  LATICACIDVEN 1.8    Recent Results (from the past 240 hour(s))  Surgical pcr screen     Status: None   Collection Time: 11/12/16  7:56 PM  Result Value Ref Range Status   MRSA, PCR NEGATIVE NEGATIVE Final   Staphylococcus aureus NEGATIVE NEGATIVE Final    Comment:        The Xpert SA Assay (FDA approved for NASAL specimens in patients over 69 years of age), is one component of a comprehensive surveillance program.  Test performance has been validated by [facility] for patients greater than or equal to 51 year old. It is not intended to diagnose infection nor to guide or monitor treatment.          Radiology Studies: Dg Chest 2 View  Result Date: 11/12/2016 CLINICAL DATA:  Midline through  left-sided chest pain radiating to the back since this morning. History of coronary artery disease and previous MI, nonsmoker. EXAM: CHEST  2 VIEW COMPARISON:  PA and lateral chest x-ray of April 27, 2015 FINDINGS: The lungs are adequately inflated. There is no focal infiltrate. There is no pleural effusion. The heart is top-normal in size. The pulmonary vascularity is not engorged. The mediastinum is normal in width. The bony thorax exhibits no acute abnormality. IMPRESSION: There is no acute cardiopulmonary abnormality. Electronically Signed   By: David  [place] M.D.   On: 11/12/2016 07:57        Scheduled Meds: . aspirin  81 mg Oral Daily  . atorvastatin  80 mg  Oral q1800  . gabapentin  600 mg Oral TID  . insulin aspart  0-15 Units Subcutaneous TID WC  . insulin glargine  40 Units Subcutaneous Daily  . lisinopril  5 mg Oral Daily  . metoprolol tartrate  12.5 mg Oral BID  . sodium chloride flush  3 mL Intravenous Q12H  . traZODone  100 mg Oral QHS   Continuous Infusions: . sodium chloride    . sodium chloride 75 mL/hr at 11/13/16 0215  . heparin 1,400 Units/hr (11/13/16 0515)  . nitroGLYCERIN Stopped (11/12/16 2019)     LOS: 0 days        Glade LloydKshitiz Fronnie Urton, MD [facility] Pager [phone]  If 7PM-7AM, please contact night-coverage [email or web address] Password [email or web address] 11/13/2016, 10:03 AM

## 2016-11-13 NOTE — Progress Notes (Signed)
Progress Note  Patient Name: [name] [name] Date of Encounter: 11/13/2016  Primary Cardiologist: Dr Jens Som  Subjective   No chest pain or dyspnea  Inpatient Medications    Scheduled Meds: . aspirin  81 mg Oral Daily  . atorvastatin  80 mg Oral q1800  . gabapentin  600 mg Oral TID  . insulin aspart  0-15 Units Subcutaneous TID WC  . insulin glargine  40 Units Subcutaneous Daily  . lisinopril  40 mg Oral Daily  . metoprolol tartrate  12.5 mg Oral BID  . sodium chloride flush  3 mL Intravenous Q12H  . traZODone  100 mg Oral QHS   Continuous Infusions: . sodium chloride    . sodium chloride 75 mL/hr at 11/13/16 0215  . heparin 1,400 Units/hr (11/13/16 0515)  . nitroGLYCERIN Stopped (11/12/16 2019)   PRN Meds: sodium chloride, acetaminophen, hydrALAZINE, hydrALAZINE, morphine injection, ondansetron (ZOFRAN) IV, sodium chloride flush, zolpidem   Vital Signs    Vitals:   11/12/16 2326 11/12/16 2345 11/13/16 0208 11/13/16 0438  BP: (!) 91/58 102/65 105/76 122/88  Pulse:  71  67  Resp:      Temp:  97.9 F (36.6 C)  (!) 97.5 F (36.4 C)  TempSrc:  Oral  Oral  SpO2: 97% 98% 96% 99%  Weight:    95.1 kg (209 lb 11.2 oz)  Height:        Intake/Output Summary (Last 24 hours) at 11/13/16 0848 Last data filed at 11/13/16 0701  Gross per 24 hour  Intake          2512.85 ml  Output              555 ml  Net          1957.85 ml   Filed Weights   11/12/16 0628 11/12/16 1017 11/13/16 0438  Weight: 93 kg (205 lb) 92.9 kg (204 lb 12.8 oz) 95.1 kg (209 lb 11.2 oz)    Telemetry    Sinus- Personally Reviewed   Physical Exam   GEN: No acute distress.   Neck: No JVD Cardiac: RRR, no murmurs, rubs, or gallops.  Respiratory: Clear to auscultation bilaterally. GI: Soft, nontender, non-distended  MS: No edema; Radial cath site with no hematoma Neuro:  Nonfocal  Psych: Normal affect   Labs    Chemistry Recent Labs Lab 11/12/16 0635 11/12/16 0914 11/13/16 0359    NA 138  --  138  K 3.8  --  3.5  CL 104  --  108  CO2 23  --  23  GLUCOSE 267*  --  185*  BUN 14  --  17  CREATININE 0.85 0.86 0.89  CALCIUM 9.3  --  8.3*  GFRNONAA >60 >60 >60  GFRAA >60 >60 >60  ANIONGAP 11  --  7     Hematology Recent Labs Lab 11/12/16 0914 11/12/16 2100 11/13/16 0359  WBC 8.4 8.9 8.5  RBC 5.14 4.69 4.72  HGB 15.7 14.2 14.2  HCT 45.4 41.5 41.7  MCV 88.3 88.5 88.3  MCH 30.5 30.3 30.1  MCHC 34.6 34.2 34.1  RDW 13.3 13.5 13.5  PLT 151 189 150    Cardiac Enzymes Recent Labs Lab 11/12/16 0914 11/12/16 1139 11/12/16 1423 11/12/16 2249  TROPONINI <0.03 <0.03 <0.03 <0.03    Recent Labs Lab 11/12/16 0702  TROPIPOC 0.01      DDimer  Recent Labs Lab 11/12/16 0635  DDIMER <0.27     Radiology    Dg Chest  2 View  Result Date: 11/12/2016 CLINICAL DATA:  Midline through left-sided chest pain radiating to the back since this morning. History of coronary artery disease and previous MI, nonsmoker. EXAM: CHEST  2 VIEW COMPARISON:  PA and lateral chest x-ray of April 27, 2015 FINDINGS: The lungs are adequately inflated. There is no focal infiltrate. There is no pleural effusion. The heart is top-normal in size. The pulmonary vascularity is not engorged. The mediastinum is normal in width. The bony thorax exhibits no acute abnormality. IMPRESSION: There is no acute cardiopulmonary abnormality. Electronically Signed   By: David  [place]and[place] M.D.   On: 11/12/2016 07:57    Patient Profile     56 y.o. male with past medical history of coronary artery disease, diabetes mellitus, hypertension, hyperlipidemia with unstable angina. Patient had PCI of his right coronary artery in 2005 in the setting of an acute inferior infarct. Catheterization yesterday showed severe three-vessel coronary artery disease, normal LV function. Echocardiogram showed normal LV function and moderate diastolic dysfunction.  Assessment & Plan    1 unstable angina-Catheterization results  noted. Patient will require coronary artery bypass graft. Await consult from cardiothoracic surgery. Continue aspirin, heparin, nitroglycerin, beta blockade and statin.   2 hypertension-blood pressure borderline. Decrease lisinopril from 40-5 mg daily and follow. Continue metoprolol.  3 hyperlipidemia-continue statin.  4 diabetes mellitus-management per primary care.  Signed, Olga MillersBrian Sims Laday, MD  11/13/2016, 8:48 AM

## 2016-11-13 NOTE — Progress Notes (Signed)
ANTICOAGULATION CONSULT NOTE - Follow Up Consult  Pharmacy Consult for Heparin  Indication: CAD awaiting CABG   No Known Allergies  Patient Measurements: Height: 5\' 11"  (180.3 cm) Weight: 209 lb 11.2 oz (95.1 kg) IBW/kg (Calculated) : 75.3  Vital Signs: Temp: 97.5 F (36.4 C) (07/06 0438) Temp Source: Oral (07/06 0438) BP: 122/88 (07/06 0438) Pulse Rate: 67 (07/06 0438)  Labs:  Recent Labs  11/12/16 0635  11/12/16 0914 11/12/16 1139 11/12/16 1423 11/12/16 2100 11/12/16 2249 11/13/16 0359  HGB 16.5  --  15.7  --   --  14.2  --  14.2  HCT 49.4  --  45.4  --   --  41.5  --  41.7  PLT 170  --  151  --   --  189  --  150  LABPROT  --   --   --   --   --   --   --  13.9  INR  --   --   --   --   --   --   --  1.07  HEPARINUNFRC  --   --   --   --   --   --   --  0.26*  CREATININE 0.85  --  0.86  --   --   --   --   --   TROPONINI  --   < > <0.03 <0.03 <0.03  --  <0.03  --   < > = values in this interval not displayed.  Estimated Creatinine Clearance: 112.9 mL/min (by C-G formula based on SCr of 0.86 mg/dL).   Assessment: 56 y/o M with multi-vessel CAD. Per CVTS, will have CABG with first available OR posting. Initial heparin level is sub-therapeutic. No issues per RN.   Goal of Therapy:  Heparin level 0.3-0.7 units/ml Monitor platelets by anticoagulation protocol: Yes   Plan:  -Increase heparin to 1400 units/hr -1200 HL -F/U CABG time  Abran DukeLedford, Yarlin Breisch 11/13/2016,4:54 AM

## 2016-11-13 NOTE — Progress Notes (Signed)
MD on call notified of CBG >300 a this time. No orders received.

## 2016-11-13 NOTE — Progress Notes (Signed)
Pt with multiple readings of BP with systolics in the 70s and MAPs hovering mid 50s to low 60s (see flowsheet for vital readings) MD on call notified of pt's condition and after 1.5 liter bolus given, cardiology MD rounds to bedside. RN given orders to obtain BP reading in the RUE despite pt having had heart cath attempt to rt radial artery today. BP is 108/73 with MAP 86. Cardiology with no new orders has this BP reading is stable. Will continue to monitor pt and continuous IV fluids of NS resumed at 1675ml/hr at this time

## 2016-11-13 NOTE — Progress Notes (Signed)
Pt BP noted to be 73/58 with MAP of 65, HR 70s NSR. Pt is asymptomatic, denies pain. MD on call notified and orders given to stop nitroglycerin gtt and given NS bolus. Labs obtained. Will continue to monitor.

## 2016-11-13 NOTE — Evaluation (Signed)
Physical Therapy Evaluation and D/C Patient Details Name: [name] [name] MRN: [id number] DOB: 10/29/1960 Today's Date: 11/13/2016   History of Present Illness  56 year old male with history of sleep apnea, coronary artery disease and MI, diabetes mellitus presented with chest pain. She was evaluated by cardiology and had cardiac catheterization done on 11/12/2016 which showed severe three-vessel coronary artery disease. CT surgery has seen the patient in consultation and planning for CABG.  Clinical Impression  Pt admitted with above diagnosis. Pt currently without significant functional limitations ambulating independently without a device.  No LOB noted.  Pt does not need SKilled PT at this time but may need it in the future after heart surgery.  Please reorder skilled PT prn after surgery.      Follow Up Recommendations No PT follow up (At this time, no PT follow up.  After surgery,will need PT. )    Equipment Recommendations  None recommended by PT    Recommendations for Other Services       Precautions / Restrictions Precautions Precautions: Fall Restrictions Weight Bearing Restrictions: No      Mobility  Bed Mobility Overal bed mobility: Independent                Transfers Overall transfer level: Independent                  Ambulation/Gait Ambulation/Gait assistance: Supervision Ambulation Distance (Feet): 500 Feet Assistive device: None Gait Pattern/deviations: WFL(Within Functional Limits)   Gait velocity interpretation: at or above normal speed for age/gender General Gait Details: No difficulties with gait.  VSS.  Can withstand challenges to balance.   Stairs            Wheelchair Mobility    Modified Rankin (Stroke Patients Only)       Balance Overall balance assessment: Independent                                           Pertinent Vitals/Pain Pain Assessment: Faces Faces Pain Scale: Hurts little more Pain  Location: back Pain Descriptors / Indicators: Discomfort;Sore Pain Intervention(s): Limited activity within patient's tolerance;Monitored during session;Repositioned    Home Living Family/patient expects to be discharged to:: Private residence Living Arrangements: Spouse/significant other Available Help at Discharge: Family;Available 24 hours/day (girlfriend and family) Type of Home: House Home Access: Stairs to enter Entrance Stairs-Rails: None Entrance Stairs-Number of Steps: 3 Home Layout: One level Home Equipment: None      Prior Function Level of Independence: Independent               Hand Dominance        Extremity/Trunk Assessment   Upper Extremity Assessment Upper Extremity Assessment: Defer to OT evaluation    Lower Extremity Assessment Lower Extremity Assessment: Overall WFL for tasks assessed    Cervical / Trunk Assessment Cervical / Trunk Assessment: Normal  Communication   Communication: No difficulties  Cognition Arousal/Alertness: Awake/alert Behavior During Therapy: WFL for tasks assessed/performed Overall Cognitive Status: Within Functional Limits for tasks assessed                                        General Comments      Exercises     Assessment/Plan    PT Assessment Patent does not need any further  PT services  PT Problem List         PT Treatment Interventions      PT Goals (Current goals can be found in the Care Plan section)  Acute Rehab PT Goals PT Goal Formulation: All assessment and education complete, DC therapy    Frequency     Barriers to discharge        Co-evaluation               AM-PAC PT "6 Clicks" Daily Activity  Outcome Measure Difficulty turning over in bed (including adjusting bedclothes, sheets and blankets)?: None Difficulty moving from lying on back to sitting on the side of the bed? : None Difficulty sitting down on and standing up from a chair with arms (e.g.,  wheelchair, bedside commode, etc,.)?: None Help needed moving to and from a bed to chair (including a wheelchair)?: None Help needed walking in hospital room?: None Help needed climbing 3-5 steps with a railing? : None 6 Click Score: 24    End of Session Equipment Utilized During Treatment: Gait belt Activity Tolerance: Patient tolerated treatment well Patient left: in bed;with call bell/phone within reach Nurse Communication: Mobility status PT Visit Diagnosis: Muscle weakness (generalized) (M62.81)    Time: 4098-1191 PT Time Calculation (min) (ACUTE ONLY): 12 min   Charges:   PT Evaluation $PT Eval Low Complexity: 1 Procedure     PT G Codes:   PT G-Codes **NOT FOR INPATIENT CLASS** Functional Assessment Tool Used: AM-PAC 6 Clicks Basic Mobility Functional Limitation: Mobility: Walking and moving around Mobility: Walking and Moving Around Current Status (Y7829): 0 percent impaired, limited or restricted Mobility: Walking and Moving Around Goal Status (F6213): 0 percent impaired, limited or restricted Mobility: Walking and Moving Around Discharge Status (Y8657): 0 percent impaired, limited or restricted    [facility] Acute Rehabilitation [phone] (pager)   Berline Lopes 11/13/2016, 4:04 PM

## 2016-11-13 NOTE — Progress Notes (Signed)
Inpatient Diabetes Program Recommendations  AACE/ADA: New Consensus Statement on Inpatient Glycemic Control (2015)  Target Ranges:  Prepandial:   less than 140 mg/dL      Peak postprandial:   less than 180 mg/dL (1-2 hours)      Critically ill patients:  140 - 180 mg/dL   Lab Results  Component Value Date   GLUCAP 242 (H) 11/13/2016   HGBA1C 10.7 11/15/2015    Review of Glycemic Control:  Results for Kathleen ArgueFIELDS, Kush D (MRN [id number]) as of 11/13/2016 12:58  Ref. Range 11/12/2016 11:06 11/12/2016 16:57 11/12/2016 21:23 11/13/2016 08:02 11/13/2016 12:09  Glucose-Capillary Latest Ref Range: 65 - 99 mg/dL 191280 (H) 99 478367 (H) 295225 (H) 242 (H)   Diabetes history: Type 2 diabetes- A1C pending Outpatient Diabetes medications: Toujeo 40 units q AM, Novolog 20 units tid with meals Current orders for Inpatient glycemic control:  Lantus 40 units daily, Novolog moderate tid with meals and HS  Inpatient Diabetes Program Recommendations:   May consider increasing Lantus to 45 units daily and add Novolog meal coverage 5 units tid with meals.   Thanks, Beryl MeagerJenny Jasean Ambrosia, RN, BC-ADM Inpatient Diabetes Coordinator Pager [phone]

## 2016-11-13 NOTE — Progress Notes (Signed)
ANTICOAGULATION CONSULT NOTE - Follow Up Consult  Pharmacy Consult for Heparin  Indication: CAD awaiting CABG   No Known Allergies  Patient Measurements: Height: 5\' 11"  (180.3 cm) Weight: 209 lb 11.2 oz (95.1 kg) IBW/kg (Calculated) : 75.3  Vital Signs: Temp: 98.6 F (37 C) (07/06 1300) Temp Source: Oral (07/06 1300) BP: 122/88 (07/06 0438) Pulse Rate: 67 (07/06 0438)  Labs:  Recent Labs  11/12/16 [id number]  11/12/16 0914 11/12/16 1139 11/12/16 1423 11/12/16 2100 11/12/16 2249 11/13/16 0359 11/13/16 1302  HGB 16.5  --  15.7  --   --  14.2  --  14.2  --   HCT 49.4  --  45.4  --   --  41.5  --  41.7  --   PLT 170  --  151  --   --  189  --  150  --   LABPROT  --   --   --   --   --   --   --  13.9  --   INR  --   --   --   --   --   --   --  1.07  --   HEPARINUNFRC  --   --   --   --   --   --   --  0.26* 0.48  CREATININE 0.85  --  0.86  --   --   --   --  0.89  --   TROPONINI  --   < > <0.03 <0.03 <0.03  --  <0.03  --   --   < > = values in this interval not displayed.  Estimated Creatinine Clearance: 109.1 mL/min (by C-G formula based on SCr of 0.89 mg/dL).   Assessment: 56 y/o M with multi-vessel CAD. Per CVTS, will have CABG with first available OR posting.   Heparin level therapeutic at 0.48, on heparin gtt 1400 units/hr.  CBC stable, no bleeding noted.    Goal of Therapy:  Heparin level 0.3-0.7 units/ml Monitor platelets by anticoagulation protocol: Yes   Plan:  -Continue heparin gtt at 1400 units/hr -Daily Heparin level, CBC -Monitor s/sx bleeding -F/U CABG time  Allena Katzaroline E Emalie Mcwethy, Pharm.D. PGY2 Pharmacy Resident 11/13/2016 2:10 PM Main Pharmacy: [phone]

## 2016-11-13 NOTE — Progress Notes (Signed)
1 Day Post-Op Procedure(s) (LRB): Left Heart Cath and Coronary Angiography (N/A) Subjective: Severe three-vessel disease with non-ST elevation MI Stable on IV heparin PFTs show moderate disease but satisfactory for sternotomy Pre-CABG Dopplers showed no significant carotid stenosis Left brachial pressure slightly reduced compared to right Blood sugars improved with augmenting insulin regimen  Objective: Vital signs in last 24 hours: Temp:  [97.5 F (36.4 C)-98.7 F (37.1 C)] 98.6 F (37 C) (07/06 1300) Pulse Rate:  [67-79] 79 (07/06 1300) Cardiac Rhythm: Normal sinus rhythm (07/06 0930) Resp:  [20] 20 (07/06 1300) BP: (64-133)/(49-88) 110/79 (07/06 1300) SpO2:  [93 %-99 %] 99 % (07/06 0438) Weight:  [209 lb 11.2 oz (95.1 kg)] 209 lb 11.2 oz (95.1 kg) (07/06 0438)  Hemodynamic parameters for last 24 hours:  sinus rhythm  Intake/Output from previous day: 07/05 0701 - 07/06 0700 In: 2158.7 [P.O.:120; I.V.:538.7; IV Piggyback:1500] Out: 555 [Urine:555] Intake/Output this shift: Total I/O In: 948.9 [P.O.:480; I.V.:468.9] Out: 900 [Urine:900]       Exam    General- alert and comfortable   Lungs- clear without rales, wheezes   Cor- regular rate and rhythm, no murmur , gallop   Abdomen- soft, non-tender   Extremities - warm, non-tender, minimal edema   Neuro- oriented, appropriate, no focal weakness   Lab Results:  Recent Labs  11/12/16 2100 11/13/16 0359  WBC 8.9 8.5  HGB 14.2 14.2  HCT 41.5 41.7  PLT 189 150   BMET:  Recent Labs  11/12/16 0635 11/12/16 0914 11/13/16 0359  NA 138  --  138  K 3.8  --  3.5  CL 104  --  108  CO2 23  --  23  GLUCOSE 267*  --  185*  BUN 14  --  17  CREATININE 0.85 0.86 0.89  CALCIUM 9.3  --  8.3*    PT/INR:  Recent Labs  11/13/16 0359  LABPROT 13.9  INR 1.07   ABG No results found for: PHART, HCO3, TCO2, ACIDBASEDEF, O2SAT CBG (last 3)   Recent Labs  11/13/16 0802 11/13/16 1209 11/13/16 1727  GLUCAP 225*  242* 201*    Assessment/Plan: S/P Procedure(s) (LRB): Left Heart Cath and Coronary Angiography (N/A) Multivessel CABG plans for Tuesday, July 10 Procedure plan discussed with patient and he understands and agrees.  LOS: 0 days    [name] [name] 11/13/2016

## 2016-11-14 DIAGNOSIS — E1165 Type 2 diabetes mellitus with hyperglycemia: Secondary | ICD-10-CM

## 2016-11-14 DIAGNOSIS — E1169 Type 2 diabetes mellitus with other specified complication: Secondary | ICD-10-CM

## 2016-11-14 DIAGNOSIS — E1140 Type 2 diabetes mellitus with diabetic neuropathy, unspecified: Secondary | ICD-10-CM

## 2016-11-14 DIAGNOSIS — E1159 Type 2 diabetes mellitus with other circulatory complications: Secondary | ICD-10-CM

## 2016-11-14 DIAGNOSIS — I25110 Atherosclerotic heart disease of native coronary artery with unstable angina pectoris: Secondary | ICD-10-CM

## 2016-11-14 DIAGNOSIS — I10 Essential (primary) hypertension: Secondary | ICD-10-CM

## 2016-11-14 DIAGNOSIS — E785 Hyperlipidemia, unspecified: Secondary | ICD-10-CM

## 2016-11-14 DIAGNOSIS — I25118 Atherosclerotic heart disease of native coronary artery with other forms of angina pectoris: Secondary | ICD-10-CM

## 2016-11-14 LAB — BASIC METABOLIC PANEL
Anion gap: 6 (ref 5–15)
BUN: 12 mg/dL (ref 6–20)
CO2: 24 mmol/L (ref 22–32)
Calcium: 8.8 mg/dL — ABNORMAL LOW (ref 8.9–10.3)
Chloride: 108 mmol/L (ref 101–111)
Creatinine, Ser: 0.8 mg/dL (ref 0.61–1.24)
GFR calc Af Amer: >60 mL/min (ref >60)
GFR calc non Af Amer: >60 mL/min (ref >60)
Glucose, Bld: 134 mg/dL — ABNORMAL HIGH (ref 65–99)
Potassium: 3.8 mmol/L (ref 3.5–5.1)
Sodium: 138 mmol/L (ref 135–145)

## 2016-11-14 LAB — GLUCOSE, CAPILLARY
Glucose-Capillary: 120 mg/dL — ABNORMAL HIGH (ref 65–99)
Glucose-Capillary: 206 mg/dL — ABNORMAL HIGH (ref 65–99)
Glucose-Capillary: 198 mg/dL — ABNORMAL HIGH (ref 65–99)
Glucose-Capillary: 139 mg/dL — ABNORMAL HIGH (ref 65–99)

## 2016-11-14 LAB — CBC
HCT: 40.7 % (ref 39.0–52.0)
Hemoglobin: 13.8 g/dL (ref 13.0–17.0)
MCH: 30 pg (ref 26.0–34.0)
MCHC: 33.9 g/dL (ref 30.0–36.0)
MCV: 88.5 fL (ref 78.0–100.0)
Platelets: 149 10*3/uL — ABNORMAL LOW (ref 150–400)
RBC: 4.6 MIL/uL (ref 4.22–5.81)
RDW: 13.6 % (ref 11.5–15.5)
WBC: 8.4 10*3/uL (ref 4.0–10.5)

## 2016-11-14 LAB — MAGNESIUM: Magnesium: 2.1 mg/dL (ref 1.7–2.4)

## 2016-11-14 LAB — HEMOGLOBIN A1C
Hgb A1c MFr Bld: 12 % — ABNORMAL HIGH (ref 4.8–5.6)
Mean Plasma Glucose: 298 mg/dL

## 2016-11-14 LAB — HEPARIN LEVEL (UNFRACTIONATED): Heparin Unfractionated: 0.53 IU/mL (ref 0.30–0.70)

## 2016-11-14 NOTE — Progress Notes (Signed)
Progress Note  Patient Name: [name] [name] Date of Encounter: 11/14/2016  Primary Cardiologist: Jens Somrenshaw  Subjective   Denies chest pain and shortness of breath. Feels well.  Inpatient Medications    Scheduled Meds: . aspirin  81 mg Oral Daily  . atorvastatin  80 mg Oral q1800  . clotrimazole   Topical BID  . gabapentin  600 mg Oral TID  . insulin aspart  0-15 Units Subcutaneous TID WC  . insulin aspart  5 Units Subcutaneous TID WC  . insulin glargine  45 Units Subcutaneous Daily  . lisinopril  5 mg Oral Daily  . metoprolol tartrate  12.5 mg Oral BID  . sodium chloride flush  3 mL Intravenous Q12H  . traZODone  100 mg Oral QHS   Continuous Infusions: . sodium chloride    . heparin 1,400 Units/hr (11/13/16 1513)  . nitroGLYCERIN Stopped (11/12/16 2019)   PRN Meds: sodium chloride, acetaminophen, guaiFENesin-dextromethorphan, hydrALAZINE, hydrALAZINE, morphine injection, ondansetron (ZOFRAN) IV, oxyCODONE-acetaminophen, sodium chloride flush, zolpidem   Vital Signs    Vitals:   11/13/16 2035 11/14/16 0429 11/14/16 0751 11/14/16 0842  BP: 135/86 94/71  122/85  Pulse: 86 64    Resp:      Temp: 97.7 F (36.5 C) (!) 97.5 F (36.4 C)    TempSrc: Oral Oral    SpO2: 99% 100% 100% 99%  Weight:  210 lb 6.4 oz (95.4 kg)    Height:        Intake/Output Summary (Last 24 hours) at 11/14/16 1211 Last data filed at 11/14/16 0900  Gross per 24 hour  Intake           1003.5 ml  Output             2400 ml  Net          -1396.5 ml   Filed Weights   11/12/16 1017 11/13/16 0438 11/14/16 0429  Weight: 204 lb 12.8 oz (92.9 kg) 209 lb 11.2 oz (95.1 kg) 210 lb 6.4 oz (95.4 kg)    Telemetry    Sinus rhythm - Personally Reviewed  ECG     Physical Exam   GEN: No acute distress.   Neck: No JVD Cardiac: RRR, no murmurs, rubs, or gallops.  Respiratory: Clear to auscultation bilaterally. GI: Soft, nontender, non-distended  MS: No edema; No deformity. Neuro:  Nonfocal    Psych: Normal affect   Labs    Chemistry Recent Labs Lab 11/12/16 0635 11/12/16 0914 11/13/16 0359 11/14/16 0202  NA 138  --  138 138  K 3.8  --  3.5 3.8  CL 104  --  108 108  CO2 23  --  23 24  GLUCOSE 267*  --  185* 134*  BUN 14  --  17 12  CREATININE 0.85 0.86 0.89 0.80  CALCIUM 9.3  --  8.3* 8.8*  GFRNONAA >60 >60 >60 >60  GFRAA >60 >60 >60 >60  ANIONGAP 11  --  7 6     Hematology Recent Labs Lab 11/12/16 2100 11/13/16 0359 11/14/16 0202  WBC 8.9 8.5 8.4  RBC 4.69 4.72 4.60  HGB 14.2 14.2 13.8  HCT 41.5 41.7 40.7  MCV 88.5 88.3 88.5  MCH 30.3 30.1 30.0  MCHC 34.2 34.1 33.9  RDW 13.5 13.5 13.6  PLT 189 150 149*    Cardiac Enzymes Recent Labs Lab 11/12/16 0914 11/12/16 1139 11/12/16 1423 11/12/16 2249  TROPONINI <0.03 <0.03 <0.03 <0.03    Recent Labs Lab 11/12/16  0702  TROPIPOC 0.01     BNPNo results for input(s): BNP, PROBNP in the last 168 hours.   DDimer  Recent Labs Lab November 20, 2016 1610  DDIMER <0.27     Radiology    No results found.  Cardiac Studies    Coronary angiography (2016/11/20):   Ost RCA to Dist RCA lesion, 80 %stenosed.  Mid RCA to Dist RCA lesion, 100 %stenosed.  Prox LAD to Mid LAD lesion, 75 %stenosed.  Mid LAD to Dist LAD lesion, 75 %stenosed.  Ost 1st Mrg to 1st Mrg lesion, 50 %stenosed.  2nd Mrg lesion, 99 %stenosed.  Ost Ramus to Ramus lesion, 75 %stenosed.  Ost 1st Diag to 1st Diag lesion, 80 %stenosed.  The left ventricular systolic function is normal.  LV end diastolic pressure is normal.  The left ventricular ejection fraction is 55-65% by visual estimate.    Echo (11-20-16):  - Left ventricle: The cavity size was normal. Wall thickness was   increased in a pattern of mild LVH. Systolic function was normal.   The estimated ejection fraction was in the range of 60% to 65%.   Wall motion was normal; there were no regional wall motion   abnormalities. Features are consistent with a pseudonormal  left   ventricular filling pattern, with concomitant abnormal relaxation   and increased filling pressure (grade 2 diastolic dysfunction).   Patient Profile     56 y.o. male with past medical history of coronary artery disease, diabetes mellitus, hypertension, hyperlipidemia with unstable angina. Patient had PCI of his right coronary artery in 2005 in the setting of an acute inferior infarct. Catheterization 11-20-16 showed severe three-vessel coronary artery disease, normal LV function. Echocardiogram showed normal LV function and moderate diastolic dysfunction.  Assessment & Plan    1. Unstable angina with severe 3-vessel CAD: Cath and echo results reviewed above. Symptomatically stable. Plan for CABG on 11/17/16. Continue ASA, IV heparin, metoprolol, lisinopril, and statin.  2. Hypertension: Controlled. No changes.  3. Hyperlipidemia: Continue statin.  4. Diastolic dysfunction: No signs of CHF. BP is controlled.  5. Type 2 diabetes: On insulin glargine and aspart.  Signed, Prentice Docker, MD  11/14/2016, 12:11 PM

## 2016-11-14 NOTE — Plan of Care (Signed)
Problem: Pain Managment: Goal: General experience of comfort will improve Outcome: Progressing Assess Routine and PRN   

## 2016-11-14 NOTE — Progress Notes (Signed)
Cardiac Rehab Note Patient lying in bed with eyes closed when RN arrived. Patient appropriately responsive to questions but did not open eyes. He is too tired to ambulate. Wants to put pants on. Girlfriend not present. She will be primary caregiver post surgery. Cardiac surgery and video handout left and patient strongly encouraged to read when he is ready. Encouraged patient to walk later today and every day until surgery. Patient not happy about not being able to walk on a track. Does not want to ambulate in hallway. Will follow-up on Monday to see if patient and/or caregiver has question about cardiac surgery information.

## 2016-11-14 NOTE — Plan of Care (Deleted)
Problem: Safety: Goal: Ability to remain free from injury will improve Outcome: Progressing Falls Precautions. Low Bed.

## 2016-11-14 NOTE — Progress Notes (Signed)
ANTICOAGULATION CONSULT NOTE - Follow Up Consult  Pharmacy Consult for Heparin Indication: CAD awaiting CABG  No Known Allergies  Patient Measurements: Height: 5\' 11"  (180.3 cm) Weight: 210 lb 6.4 oz (95.4 kg) IBW/kg (Calculated) : 75.3  Vital Signs: Temp: 97.5 F (36.4 C) (07/07 0429) Temp Source: Oral (07/07 0429) BP: 122/85 (07/07 0842) Pulse Rate: 64 (07/07 0429)  Labs:  Recent Labs  11/12/16 0914 11/12/16 1139 11/12/16 1423 11/12/16 2100 11/12/16 2249 11/13/16 0359 11/13/16 1302 11/14/16 0202  HGB 15.7  --   --  14.2  --  14.2  --  13.8  HCT 45.4  --   --  41.5  --  41.7  --  40.7  PLT 151  --   --  189  --  150  --  149*  LABPROT  --   --   --   --   --  13.9  --   --   INR  --   --   --   --   --  1.07  --   --   HEPARINUNFRC  --   --   --   --   --  0.26* 0.48 0.53  CREATININE 0.86  --   --   --   --  0.89  --  0.80  TROPONINI <0.03 <0.03 <0.03  --  <0.03  --   --   --     Estimated Creatinine Clearance: 121.5 mL/min (by C-G formula based on SCr of 0.8 mg/dL).   Medications:  Heparin @ 1400 units/hr  Assessment: 55yom continues on heparin for severe 3 vessel CAD awaiting CABG 7/10. Heparin level is therapeutic at 0.53. CBC stable. No bleeding.  Goal of Therapy:  Heparin level 0.3-0.7 units/ml Monitor platelets by anticoagulation protocol: Yes   Plan:  1) Continue heparin at 1400 units/hr 2) Daily heparin level and CBC  Fredrik RiggerMarkle, Dajohn Ellender Sue 11/14/2016,12:58 PM

## 2016-11-14 NOTE — Progress Notes (Signed)
Patient ID: [name] [name], male   DOB: April 05, 1961, 56 y.o.   MRN: [id number]  PROGRESS NOTE    [name] [name]  WUJ:[id number] DOB: Jun 02, 1960 DOA: 11/12/2016 PCP: Kaleen Mask, MD   Brief Narrative: 56 year old male with history of sleep apnea, coronary artery disease and MI, diabetes mellitus presented with chest pain. She was evaluated by cardiology and had cardiac catheterization done on 11/12/2016 which showed severe three-vessel coronary artery disease. CT surgery has seen the patient in consultation and planning for CABG.    Assessment & Plan:   Principal Problem:   Chest pain Active Problems:   DM type 2, uncontrolled, with neuropathy (HCC)   Chronic low back pain   Hypertension associated with diabetes (HCC)   Hyperlipidemia associated with type 2 diabetes mellitus (HCC)   Unstable angina (HCC)    CP due to unstable angina - Status post cardiac catheterization on 11/12/2016 showing severe three-vessel coronary artery disease. CT surgery has already seen the patient and is planning for CABG on 11/17/2016 - Currently chest pain-free and off nitroglycerin drip. Continue aspirin, statin, lisinopril and metoprolol.  Severe three-vessel coronary artery disease - Echo showed ejection fraction of 60-65% with grade 2 diastolic dysfunction  Hyperlipidemia Continue statin  Chronic low back pain Continue gabapentin  Diabetes Continue Lantus 45 units daily and NovoLog 5 units 3 times a day with meals SSI  Insomnia Continue trazodone    DVT prophylaxis: Heparin drip Code Status:  Full code Family Communication: None at bedside Disposition Plan:  depends on the outcome of CABG Consultants: Cardiology and CT surgery   Procedures:  Cardiac catheterization on 11/12/2016 showed severe three-vessel coronary artery disease Echo on 11/12/2016: - Left ventricle: The cavity size was normal. Wall thickness was increased in a pattern of mild LVH. Systolic  function was normal. The estimated ejection fraction was in the range of 60% to 65%. Wall motion was normal; there were no regional wall motion abnormalities. Features are consistent with a pseudonormal left ventricular filling pattern, with concomitant abnormal relaxation and increased filling pressure (grade 2 diastolic dysfunction).  Antimicrobials: None  Subjective: Patient seen and examined at bedside. He denies current chest pain, nausea or vomiting.  Objective: Vitals:   11/13/16 2035 11/14/16 0429 11/14/16 0751 11/14/16 0842  BP: 135/86 94/71  122/85  Pulse: 86 64    Resp:      Temp: 97.7 F (36.5 C) (!) 97.5 F (36.4 C)    TempSrc: Oral Oral    SpO2: 99% 100% 100% 99%  Weight:  95.4 kg (210 lb 6.4 oz)    Height:        Intake/Output Summary (Last 24 hours) at 11/14/16 1210 Last data filed at 11/14/16 0900  Gross per 24 hour  Intake           1003.5 ml  Output             2400 ml  Net          -1396.5 ml   Filed Weights   11/12/16 1017 11/13/16 0438 11/14/16 0429  Weight: 92.9 kg (204 lb 12.8 oz) 95.1 kg (209 lb 11.2 oz) 95.4 kg (210 lb 6.4 oz)    Examination:  General exam: Appears calm and comfortable  Respiratory system: Bilateral decreased breath sound at bases Cardiovascular system: S1 & S2 heard, Rate controlled  Gastrointestinal system: Abdomen is nondistended, soft and nontender. Normal bowel sounds heard. Extremities: No cyanosis, clubbing, edema     Data Reviewed: I  have personally reviewed following labs and imaging studies  CBC:  Recent Labs Lab 11/12/16 0635 11/12/16 0914 11/12/16 2100 11/13/16 0359 11/14/16 0202  WBC 8.3 8.4 8.9 8.5 8.4  NEUTROABS  --   --  4.7  --   --   HGB 16.5 15.7 14.2 14.2 13.8  HCT 49.4 45.4 41.5 41.7 40.7  MCV 88.8 88.3 88.5 88.3 88.5  PLT 170 151 189 150 149*   Basic Metabolic Panel:  Recent Labs Lab 11/12/16 0635 11/12/16 0914 11/13/16 0359 11/14/16 0202  NA 138  --  138 138  K 3.8   --  3.5 3.8  CL 104  --  108 108  CO2 23  --  23 24  GLUCOSE 267*  --  185* 134*  BUN 14  --  17 12  CREATININE 0.85 0.86 0.89 0.80  CALCIUM 9.3  --  8.3* 8.8*  MG  --   --   --  2.1   GFR: Estimated Creatinine Clearance: 121.5 mL/min (by C-G formula based on SCr of 0.8 mg/dL). Liver Function Tests: No results for input(s): AST, ALT, ALKPHOS, BILITOT, PROT, ALBUMIN in the last 168 hours. No results for input(s): LIPASE, AMYLASE in the last 168 hours. No results for input(s): AMMONIA in the last 168 hours. Coagulation Profile:  Recent Labs Lab 11/13/16 0359  INR 1.07   Cardiac Enzymes:  Recent Labs Lab 11/12/16 0914 11/12/16 1139 11/12/16 1423 11/12/16 2249  TROPONINI <0.03 <0.03 <0.03 <0.03   BNP (last 3 results) No results for input(s): PROBNP in the last 8760 hours. HbA1C:  Recent Labs  11/13/16 0359  HGBA1C 12.0*   CBG:  Recent Labs Lab 11/13/16 1209 11/13/16 1727 11/13/16 2040 11/14/16 0735 11/14/16 1203  GLUCAP 242* 201* 134* 120* 206*   Lipid Profile:  Recent Labs  11/13/16 0359  CHOL 111  HDL 26*  LDLCALC 36  TRIG 161*  CHOLHDL 4.3   Thyroid Function Tests:  Recent Labs  11/13/16 0359  TSH 1.144   Anemia Panel: No results for input(s): VITAMINB12, FOLATE, FERRITIN, TIBC, IRON, RETICCTPCT in the last 72 hours. Sepsis Labs:  Recent Labs Lab 11/12/16 2249  LATICACIDVEN 1.8    Recent Results (from the past 240 hour(s))  Surgical pcr screen     Status: None   Collection Time: 11/12/16  7:56 PM  Result Value Ref Range Status   MRSA, PCR NEGATIVE NEGATIVE Final   Staphylococcus aureus NEGATIVE NEGATIVE Final    Comment:        The Xpert SA Assay (FDA approved for NASAL specimens in patients over 13 years of age), is one component of a comprehensive surveillance program.  Test performance has been validated by [facility] for patients greater than or equal to 8 year old. It is not intended to diagnose infection nor  to guide or monitor treatment.          Radiology Studies: No results found.      Scheduled Meds: . aspirin  81 mg Oral Daily  . atorvastatin  80 mg Oral q1800  . clotrimazole   Topical BID  . gabapentin  600 mg Oral TID  . insulin aspart  0-15 Units Subcutaneous TID WC  . insulin aspart  5 Units Subcutaneous TID WC  . insulin glargine  45 Units Subcutaneous Daily  . lisinopril  5 mg Oral Daily  . metoprolol tartrate  12.5 mg Oral BID  . sodium chloride flush  3 mL Intravenous Q12H  . traZODone  100 mg Oral QHS   Continuous Infusions: . sodium chloride    . heparin 1,400 Units/hr (11/13/16 1513)  . nitroGLYCERIN Stopped (11/12/16 2019)     LOS: 1 day        Glade LloydKshitiz Stepheny Canal, MD [facility] Pager [phone]  If 7PM-7AM, please contact night-coverage [email or web address] Password [email or web address] 11/14/2016, 12:10 PM

## 2016-11-15 DIAGNOSIS — N529 Male erectile dysfunction, unspecified: Secondary | ICD-10-CM

## 2016-11-15 LAB — CBC
HCT: 42.3 % (ref 39.0–52.0)
Hemoglobin: 14.3 g/dL (ref 13.0–17.0)
MCH: 30.4 pg (ref 26.0–34.0)
MCHC: 33.8 g/dL (ref 30.0–36.0)
MCV: 89.8 fL (ref 78.0–100.0)
Platelets: 171 10*3/uL (ref 150–400)
RBC: 4.71 MIL/uL (ref 4.22–5.81)
RDW: 13.6 % (ref 11.5–15.5)
WBC: 7.4 10*3/uL (ref 4.0–10.5)

## 2016-11-15 LAB — BASIC METABOLIC PANEL
Anion gap: 8 (ref 5–15)
BUN: 13 mg/dL (ref 6–20)
CO2: 22 mmol/L (ref 22–32)
Calcium: 8.6 mg/dL — ABNORMAL LOW (ref 8.9–10.3)
Chloride: 107 mmol/L (ref 101–111)
Creatinine, Ser: 0.79 mg/dL (ref 0.61–1.24)
GFR calc Af Amer: >60 mL/min (ref >60)
GFR calc non Af Amer: >60 mL/min (ref >60)
Glucose, Bld: 196 mg/dL — ABNORMAL HIGH (ref 65–99)
Potassium: 3.8 mmol/L (ref 3.5–5.1)
Sodium: 137 mmol/L (ref 135–145)

## 2016-11-15 LAB — VAS US DOPPLER PRE CABG
LEFT ECA DIAS: -11 cm/s
LEFT VERTEBRAL DIAS: -18 cm/s
Left CCA dist dias: -19 cm/s
Left CCA dist sys: -114 cm/s
Left CCA prox dias: 33 cm/s
Left CCA prox sys: 147 cm/s
Left ICA dist dias: -28 cm/s
Left ICA dist sys: -81 cm/s
Left ICA prox dias: -15 cm/s
Left ICA prox sys: -65 cm/s
RIGHT CCA MID DIAS: 28 cm/s
RIGHT ECA DIAS: -18 cm/s
RIGHT VERTEBRAL DIAS: 22 cm/s
Right CCA prox dias: 19 cm/s
Right CCA prox sys: 98 cm/s
Right cca dist sys: -96 cm/s

## 2016-11-15 LAB — GLUCOSE, CAPILLARY
Glucose-Capillary: 182 mg/dL — ABNORMAL HIGH (ref 65–99)
Glucose-Capillary: 170 mg/dL — ABNORMAL HIGH (ref 65–99)
Glucose-Capillary: 209 mg/dL — ABNORMAL HIGH (ref 65–99)
Glucose-Capillary: 173 mg/dL — ABNORMAL HIGH (ref 65–99)

## 2016-11-15 LAB — HEPARIN LEVEL (UNFRACTIONATED): Heparin Unfractionated: 0.34 IU/mL (ref 0.30–0.70)

## 2016-11-15 LAB — MAGNESIUM: Magnesium: 2.3 mg/dL (ref 1.7–2.4)

## 2016-11-15 NOTE — Plan of Care (Signed)
Problem: Pain Managment: Goal: General experience of comfort will improve Outcome: Progressing Pt denied any CP throughout night.  Problem: Physical Regulation: Goal: Ability to maintain clinical measurements within normal limits will improve Outcome: Progressing VSS. Denies CP or SOB. Walked multiple laps around unit. Tolerated well. Continue to monitor

## 2016-11-15 NOTE — Progress Notes (Signed)
Progress Note  Patient Name: [name] [name] Date of Encounter: 11/15/2016  Primary Cardiologist: Jens Somrenshaw  Subjective   Denies chest pain and exertional dyspnea. Did 40 laps around nurse's station earlier today. Had questions about erectile dysfunction.  Inpatient Medications    Scheduled Meds: . aspirin  81 mg Oral Daily  . atorvastatin  80 mg Oral q1800  . clotrimazole   Topical BID  . gabapentin  600 mg Oral TID  . insulin aspart  0-15 Units Subcutaneous TID WC  . insulin aspart  5 Units Subcutaneous TID WC  . insulin glargine  45 Units Subcutaneous Daily  . lisinopril  5 mg Oral Daily  . metoprolol tartrate  12.5 mg Oral BID  . sodium chloride flush  3 mL Intravenous Q12H  . traZODone  100 mg Oral QHS   Continuous Infusions: . sodium chloride    . heparin 1,400 Units/hr (11/15/16 0212)  . nitroGLYCERIN Stopped (11/12/16 2019)   PRN Meds: sodium chloride, acetaminophen, guaiFENesin-dextromethorphan, hydrALAZINE, hydrALAZINE, morphine injection, ondansetron (ZOFRAN) IV, oxyCODONE-acetaminophen, sodium chloride flush, zolpidem   Vital Signs    Vitals:   11/15/16 0458 11/15/16 0715 11/15/16 0832 11/15/16 1138  BP: 118/76   122/75  Pulse:    75  Resp: 20   19  Temp:    98.1 F (36.7 C)  TempSrc:    Oral  SpO2: 96% 98% 99% 99%  Weight: 208 lb 8 oz (94.6 kg)     Height:        Intake/Output Summary (Last 24 hours) at 11/15/16 1342 Last data filed at 11/15/16 1317  Gross per 24 hour  Intake             1101 ml  Output             2475 ml  Net            -1374 ml   Filed Weights   11/13/16 0438 11/14/16 0429 11/15/16 0458  Weight: 209 lb 11.2 oz (95.1 kg) 210 lb 6.4 oz (95.4 kg) 208 lb 8 oz (94.6 kg)    Telemetry    Sinus rhythm - Personally Reviewed  ECG      Physical Exam   GEN: No acute distress.   Neck: No JVD Cardiac: RRR, no murmurs, rubs, or gallops.  Respiratory: Clear to auscultation bilaterally. GI: Soft, nontender, non-distended    MS: No edema; No deformity. Neuro:  Nonfocal  Psych: Normal affect   Labs    Chemistry Recent Labs Lab 11/13/16 0359 11/14/16 0202 11/15/16 0436  NA 138 138 137  K 3.5 3.8 3.8  CL 108 108 107  CO2 23 24 22   GLUCOSE 185* 134* 196*  BUN 17 12 13   CREATININE 0.89 0.80 0.79  CALCIUM 8.3* 8.8* 8.6*  GFRNONAA >60 >60 >60  GFRAA >60 >60 >60  ANIONGAP 7 6 8      Hematology Recent Labs Lab 11/13/16 0359 11/14/16 0202 11/15/16 0436  WBC 8.5 8.4 7.4  RBC 4.72 4.60 4.71  HGB 14.2 13.8 14.3  HCT 41.7 40.7 42.3  MCV 88.3 88.5 89.8  MCH 30.1 30.0 30.4  MCHC 34.1 33.9 33.8  RDW 13.5 13.6 13.6  PLT 150 149* 171    Cardiac Enzymes Recent Labs Lab 11/12/16 0914 11/12/16 1139 11/12/16 1423 11/12/16 2249  TROPONINI <0.03 <0.03 <0.03 <0.03    Recent Labs Lab 11/12/16 0702  TROPIPOC 0.01     BNPNo results for input(s): BNP, PROBNP in the last 168  hours.   DDimer  Recent Labs Lab 11/12/16 [phone]  DDIMER <0.27     Radiology    No results found.  Cardiac Studies   Coronary angiography (11/12/16):   Ost RCA to Dist RCA lesion, 80 %stenosed.  Mid RCA to Dist RCA lesion, 100 %stenosed.  Prox LAD to Mid LAD lesion, 75 %stenosed.  Mid LAD to Dist LAD lesion, 75 %stenosed.  Ost 1st Mrg to 1st Mrg lesion, 50 %stenosed.  2nd Mrg lesion, 99 %stenosed.  Ost Ramus to Ramus lesion, 75 %stenosed.  Ost 1st Diag to 1st Diag lesion, 80 %stenosed.  The left ventricular systolic function is normal.  LV end diastolic pressure is normal.  The left ventricular ejection fraction is 55-65% by visual estimate.   Echo (11/12/16):  - Left ventricle: The cavity size was normal. Wall thickness was increased in a pattern of mild LVH. Systolic function was normal. The estimated ejection fraction was in the range of 60% to 65%. Wall motion was normal; there were no regional wall motion abnormalities. Features are consistent with a pseudonormal left ventricular  filling pattern, with concomitant abnormal relaxation and increased filling pressure (grade 2 diastolic dysfunction).   Patient Profile     56 y.o. male with past medical history of coronary artery disease, diabetes mellitus, hypertension, hyperlipidemia with unstable angina. Patient had PCI of his right coronary artery in 2005 in the setting of an acute inferior infarct. Catheterization 11/12/16 showed severe three-vessel coronary artery disease, normal LV function. Echocardiogram showed normal LV function and moderate diastolic dysfunction.  Assessment & Plan    1. Unstable angina with severe 3-vessel CAD: Cath and echo results reviewed above. Symptomatically stable. Plan for CABG on 11/17/16. Continue ASA, IV heparin, metoprolol, lisinopril, and statin.  2. Hypertension: Controlled. No changes.  3. Hyperlipidemia: Continue statin.  4. Diastolic dysfunction: No signs of CHF. BP is controlled.  5. Type 2 diabetes: On insulin glargine and aspart.  6. Erectile dysfunction: This is an independent risk factor for CAD. Medical therapy can be initiated at time of cardiac rehab.  Signed, Prentice Docker, MD  11/15/2016, 1:42 PM

## 2016-11-15 NOTE — Progress Notes (Signed)
Patient ID: [name] [name], male   DOB: 1960/06/07, 56 y.o.   MRN: [id number]  PROGRESS NOTE    [name] [name]  WUJ:[id number] DOB: Sep 21, 1960 DOA: 11/12/2016 PCP: Kaleen Mask, MD   Brief Narrative:  56 year old male with history of sleep apnea, coronary artery disease and MI, diabetes mellitus presented with chest pain. She was evaluated by cardiology and had cardiac catheterization done on 11/12/2016 which showed severe three-vessel coronary artery disease. CT surgery has seen the patient in consultation and planning for CABG.  Assessment & Plan:   Principal Problem:   Chest pain Active Problems:   DM type 2, uncontrolled, with neuropathy (HCC)   Chronic low back pain   Hypertension associated with diabetes (HCC)   Hyperlipidemia associated with type 2 diabetes mellitus (HCC)   Unstable angina (HCC)   Coronary artery disease involving native coronary artery of native heart with unstable angina pectoris (HCC)   CP due to unstable angina - Status post cardiac catheterization on 11/12/2016 showing severe three-vessel coronary artery disease. CT surgery has already seen the patient and is planning for CABG on 11/17/2016 - Currently chest pain-free and off nitroglycerin drip. Continue aspirin, statin, lisinopril and metoprolol.  Severe three-vessel coronary artery disease - Echo showed ejection fraction of 60-65% with grade 2 diastolic dysfunction  Hyperlipidemia Continue statin  Chronic low back pain Continue gabapentin  Diabetes Continue Lantus 45 units daily and NovoLog 5 units 3 times a day with meals SSI HbA1C is 12  Insomnia Continue trazodone    DVT prophylaxis:Heparin drip Code Status:Full code Family Communication:None at bedside Disposition Plan: depends on the outcome of CABG Consultants:Cardiology and CT surgery   Procedures: Cardiac catheterization on 11/12/2016 showed severe three-vessel coronary artery disease Echo on  11/12/2016:- Left ventricle: The cavity size was normal. Wall thickness was increased in a pattern of mild LVH. Systolic function was normal. The estimated ejection fraction was in the range of 60% to 65%. Wall motion was normal; there were no regional wall motion abnormalities. Features are consistent with a pseudonormal left ventricular filling pattern, with concomitant abnormal relaxation and increased filling pressure (grade 2 diastolic dysfunction).  Antimicrobials: None Subjective: Patient seen and examined at bedside. He denies any current chest pain, nausea, vomiting.  Objective: Vitals:   11/14/16 1420 11/14/16 2201 11/14/16 2236 11/15/16 0458  BP: (!) 134/98 113/72 132/78 118/76  Pulse: 90 79 94   Resp: 16 17  20   Temp: 98.2 F (36.8 C) 97.9 F (36.6 C)    TempSrc: Oral Oral    SpO2: 100% 96%  96%  Weight:    94.6 kg (208 lb 8 oz)  Height:        Intake/Output Summary (Last 24 hours) at 11/15/16 1105 Last data filed at 11/15/16 0933  Gross per 24 hour  Intake           1139.3 ml  Output             2750 ml  Net          -1610.7 ml   Filed Weights   11/13/16 0438 11/14/16 0429 11/15/16 0458  Weight: 95.1 kg (209 lb 11.2 oz) 95.4 kg (210 lb 6.4 oz) 94.6 kg (208 lb 8 oz)    Examination:  General exam: Appears calm and comfortable  Respiratory system: Bilateral decreased breath sound at bases Cardiovascular system: S1 & S2 heard, Rate controlled   Gastrointestinal system: Abdomen is nondistended, soft and nontender. Normal bowel sounds heard. Extremities: No  cyanosis, clubbing, edema       Data Reviewed: I have personally reviewed following labs and imaging studies  CBC:  Recent Labs Lab 11/12/16 0914 11/12/16 2100 11/13/16 0359 11/14/16 0202 11/15/16 0436  WBC 8.4 8.9 8.5 8.4 7.4  NEUTROABS  --  4.7  --   --   --   HGB 15.7 14.2 14.2 13.8 14.3  HCT 45.4 41.5 41.7 40.7 42.3  MCV 88.3 88.5 88.3 88.5 89.8  PLT 151 189 150 149* 171    Basic Metabolic Panel:  Recent Labs Lab 11/12/16 0635 11/12/16 0914 11/13/16 0359 11/14/16 0202 11/15/16 0436  NA 138  --  138 138 137  K 3.8  --  3.5 3.8 3.8  CL 104  --  108 108 107  CO2 23  --  23 24 22   GLUCOSE 267*  --  185* 134* 196*  BUN 14  --  17 12 13   CREATININE 0.85 0.86 0.89 0.80 0.79  CALCIUM 9.3  --  8.3* 8.8* 8.6*  MG  --   --   --  2.1 2.3   GFR: Estimated Creatinine Clearance: 121 mL/min (by C-G formula based on SCr of 0.79 mg/dL). Liver Function Tests: No results for input(s): AST, ALT, ALKPHOS, BILITOT, PROT, ALBUMIN in the last 168 hours. No results for input(s): LIPASE, AMYLASE in the last 168 hours. No results for input(s): AMMONIA in the last 168 hours. Coagulation Profile:  Recent Labs Lab 11/13/16 0359  INR 1.07   Cardiac Enzymes:  Recent Labs Lab 11/12/16 0914 11/12/16 1139 11/12/16 1423 11/12/16 2249  TROPONINI <0.03 <0.03 <0.03 <0.03   BNP (last 3 results) No results for input(s): PROBNP in the last 8760 hours. HbA1C:  Recent Labs  11/13/16 0359  HGBA1C 12.0*   CBG:  Recent Labs Lab 11/14/16 0735 11/14/16 1203 11/14/16 1736 11/14/16 2208 11/15/16 0735  GLUCAP 120* 206* 198* 139* 182*   Lipid Profile:  Recent Labs  11/13/16 0359  CHOL 111  HDL 26*  LDLCALC 36  TRIG [id number]*  CHOLHDL 4.3   Thyroid Function Tests:  Recent Labs  11/13/16 0359  TSH 1.144   Anemia Panel: No results for input(s): VITAMINB12, FOLATE, FERRITIN, TIBC, IRON, RETICCTPCT in the last 72 hours. Sepsis Labs:  Recent Labs Lab 11/12/16 2249  LATICACIDVEN 1.8    Recent Results (from the past 240 hour(s))  Surgical pcr screen     Status: None   Collection Time: 11/12/16  7:56 PM  Result Value Ref Range Status   MRSA, PCR NEGATIVE NEGATIVE Final   Staphylococcus aureus NEGATIVE NEGATIVE Final    Comment:        The Xpert SA Assay (FDA approved for NASAL specimens in patients over 57 years of age), is one component of a  comprehensive surveillance program.  Test performance has been validated by [facility] for patients greater than or equal to 401 year old. It is not intended to diagnose infection nor to guide or monitor treatment.          Radiology Studies: No results found.      Scheduled Meds: . aspirin  81 mg Oral Daily  . atorvastatin  80 mg Oral q1800  . clotrimazole   Topical BID  . gabapentin  600 mg Oral TID  . insulin aspart  0-15 Units Subcutaneous TID WC  . insulin aspart  5 Units Subcutaneous TID WC  . insulin glargine  45 Units Subcutaneous Daily  . lisinopril  5 mg Oral  Daily  . metoprolol tartrate  12.5 mg Oral BID  . sodium chloride flush  3 mL Intravenous Q12H  . traZODone  100 mg Oral QHS   Continuous Infusions: . sodium chloride    . heparin 1,400 Units/hr (11/15/16 0212)  . nitroGLYCERIN Stopped (11/12/16 2019)     LOS: 2 days        Glade Lloyd, MD [facility] Pager [phone]  If 7PM-7AM, please contact night-coverage [email or web address] Password [email or web address] 11/15/2016, 11:05 AM

## 2016-11-15 NOTE — Progress Notes (Signed)
ANTICOAGULATION CONSULT NOTE - Follow Up Consult  Pharmacy Consult for Heparin Indication: CAD awaiting CABG  No Known Allergies  Patient Measurements: Height: 5\' 11"  (180.3 cm) Weight: 208 lb 8 oz (94.6 kg) IBW/kg (Calculated) : 75.3  Vital Signs: Temp: 98.1 F (36.7 C) (07/08 1138) Temp Source: Oral (07/08 1138) BP: 122/75 (07/08 1138) Pulse Rate: 75 (07/08 1138)  Labs:  Recent Labs  11/12/16 1423  11/12/16 2249  11/13/16 0359 11/13/16 1302 11/14/16 0202 11/15/16 0436  HGB  --   < >  --   --  14.2  --  13.8 14.3  HCT  --   < >  --   --  41.7  --  40.7 42.3  PLT  --   < >  --   --  150  --  149* 171  LABPROT  --   --   --   --  13.9  --   --   --   INR  --   --   --   --  1.07  --   --   --   HEPARINUNFRC  --   --   --   < > 0.26* 0.48 0.53 0.34  CREATININE  --   --   --   --  0.89  --  0.80 0.79  TROPONINI <0.03  --  <0.03  --   --   --   --   --   < > = values in this interval not displayed.  Estimated Creatinine Clearance: 121 mL/min (by C-G formula based on SCr of 0.79 mg/dL).   Medications:  Heparin @ 1400 units/hr  Assessment: 55yom continues on heparin for severe 3 vessel CAD awaiting CABG 7/10.   Heparin level is therapeutic. CBC stable. No bleeding.  Goal of Therapy:  Heparin level 0.3-0.7 units/ml Monitor platelets by anticoagulation protocol: Yes   Plan:  1) Continue heparin at 1400 units/hr 2) Daily heparin level and CBC  Sheppard CoilFrank Flossie Wexler PharmD., BCPS Clinical Pharmacist Pager [phone] 11/15/2016 1:47 PM

## 2016-11-16 DIAGNOSIS — I25110 Atherosclerotic heart disease of native coronary artery with unstable angina pectoris: Secondary | ICD-10-CM

## 2016-11-16 LAB — BLOOD GAS, ARTERIAL
Acid-base deficit: 4 mmol/L — ABNORMAL HIGH (ref 0.0–2.0)
Bicarbonate: 19.9 mmol/L — ABNORMAL LOW (ref 20.0–28.0)
Drawn by: 511471
FIO2: 21
O2 Saturation: 96 %
Patient temperature: 98.6
pCO2 arterial: 32.4 mmHg (ref 32.0–48.0)
pH, Arterial: 7.405 (ref 7.350–7.450)
pO2, Arterial: 79.5 mmHg — ABNORMAL LOW (ref 83.0–108.0)

## 2016-11-16 LAB — CBC
HCT: 40.8 % (ref 39.0–52.0)
Hemoglobin: 14 g/dL (ref 13.0–17.0)
MCH: 30.4 pg (ref 26.0–34.0)
MCHC: 34.3 g/dL (ref 30.0–36.0)
MCV: 88.5 fL (ref 78.0–100.0)
Platelets: 147 10*3/uL — ABNORMAL LOW (ref 150–400)
RBC: 4.61 MIL/uL (ref 4.22–5.81)
RDW: 13.7 % (ref 11.5–15.5)
WBC: 7.8 10*3/uL (ref 4.0–10.5)

## 2016-11-16 LAB — GLUCOSE, CAPILLARY
Glucose-Capillary: 259 mg/dL — ABNORMAL HIGH (ref 65–99)
Glucose-Capillary: 138 mg/dL — ABNORMAL HIGH (ref 65–99)
Glucose-Capillary: 146 mg/dL — ABNORMAL HIGH (ref 65–99)
Glucose-Capillary: 323 mg/dL — ABNORMAL HIGH (ref 65–99)

## 2016-11-16 LAB — BASIC METABOLIC PANEL
Anion gap: 7 (ref 5–15)
BUN: 10 mg/dL (ref 6–20)
CO2: 24 mmol/L (ref 22–32)
Calcium: 8.7 mg/dL — ABNORMAL LOW (ref 8.9–10.3)
Chloride: 107 mmol/L (ref 101–111)
Creatinine, Ser: 0.9 mg/dL (ref 0.61–1.24)
GFR calc Af Amer: >60 mL/min (ref >60)
GFR calc non Af Amer: >60 mL/min (ref >60)
Glucose, Bld: 208 mg/dL — ABNORMAL HIGH (ref 65–99)
Potassium: 3.7 mmol/L (ref 3.5–5.1)
Sodium: 138 mmol/L (ref 135–145)

## 2016-11-16 LAB — HEPARIN LEVEL (UNFRACTIONATED): Heparin Unfractionated: 0.4 IU/mL (ref 0.30–0.70)

## 2016-11-16 LAB — MAGNESIUM: Magnesium: 2.1 mg/dL (ref 1.7–2.4)

## 2016-11-16 LAB — PREPARE RBC (CROSSMATCH)

## 2016-11-16 LAB — ABO/RH: ABO/RH(D): O POS

## 2016-11-16 MED ORDER — DIAZEPAM 5 MG PO TABS
5.0000 mg | ORAL_TABLET | Freq: Once | ORAL | Status: AC
  Administered 2016-11-17: 5 mg via ORAL
  Filled 2016-11-16: qty 1

## 2016-11-16 MED ORDER — CHLORHEXIDINE GLUCONATE 4 % EX LIQD
60.0000 mL | Freq: Once | CUTANEOUS | Status: AC
  Administered 2016-11-16: 4 via TOPICAL
  Filled 2016-11-16: qty 60

## 2016-11-16 MED ORDER — POTASSIUM CHLORIDE 2 MEQ/ML IV SOLN
80.0000 meq | INTRAVENOUS | Status: DC
  Filled 2016-11-16: qty 40

## 2016-11-16 MED ORDER — SODIUM CHLORIDE 0.9 % IV SOLN
30.0000 ug/min | INTRAVENOUS | Status: DC
  Filled 2016-11-16: qty 2

## 2016-11-16 MED ORDER — TRANEXAMIC ACID (OHS) BOLUS VIA INFUSION
15.0000 mg/kg | INTRAVENOUS | Status: DC
  Filled 2016-11-16: qty 1416

## 2016-11-16 MED ORDER — BISACODYL 5 MG PO TBEC
5.0000 mg | DELAYED_RELEASE_TABLET | Freq: Once | ORAL | Status: AC
  Administered 2016-11-16: 5 mg via ORAL
  Filled 2016-11-16: qty 1

## 2016-11-16 MED ORDER — TRANEXAMIC ACID 1000 MG/10ML IV SOLN
1.5000 mg/kg/h | INTRAVENOUS | Status: DC
  Filled 2016-11-16: qty 25

## 2016-11-16 MED ORDER — DEXMEDETOMIDINE HCL IN NACL 400 MCG/100ML IV SOLN
0.1000 ug/kg/h | INTRAVENOUS | Status: DC
  Filled 2016-11-16: qty 100

## 2016-11-16 MED ORDER — DIAZEPAM 5 MG PO TABS: 5.0000 mg | ORAL_TABLET | ORAL | Status: DC | PRN

## 2016-11-16 MED ORDER — EPINEPHRINE PF 1 MG/ML IJ SOLN
0.0000 ug/min | INTRAMUSCULAR | Status: DC
  Filled 2016-11-16: qty 4

## 2016-11-16 MED ORDER — DEXTROSE 5 % IV SOLN
1.5000 g | INTRAVENOUS | Status: DC
Start: 2016-11-17 — End: 2016-11-17
  Filled 2016-11-16: qty 1.5

## 2016-11-16 MED ORDER — SODIUM CHLORIDE 0.9 % IV SOLN
INTRAVENOUS | Status: DC
  Filled 2016-11-16: qty 30

## 2016-11-16 MED ORDER — TRANEXAMIC ACID (OHS) PUMP PRIME SOLUTION
2.0000 mg/kg | INTRAVENOUS | Status: DC
  Filled 2016-11-16: qty 1.89

## 2016-11-16 MED ORDER — DEXTROSE 5 % IV SOLN
750.0000 mg | INTRAVENOUS | Status: DC
  Filled 2016-11-16: qty 750

## 2016-11-16 MED ORDER — METOPROLOL TARTRATE 12.5 MG HALF TABLET
12.5000 mg | ORAL_TABLET | Freq: Once | ORAL | Status: AC
  Administered 2016-11-17: 12.5 mg via ORAL
  Filled 2016-11-16: qty 1

## 2016-11-16 MED ORDER — SODIUM CHLORIDE 0.9 % IV SOLN
INTRAVENOUS | Status: DC
  Filled 2016-11-16: qty 1

## 2016-11-16 MED ORDER — LIVING WELL WITH DIABETES BOOK
Freq: Once | Status: AC
  Administered 2016-11-16: 15:00:00
  Filled 2016-11-16: qty 1

## 2016-11-16 MED ORDER — NITROGLYCERIN IN D5W 200-5 MCG/ML-% IV SOLN
2.0000 ug/min | INTRAVENOUS | Status: DC
  Filled 2016-11-16: qty 250

## 2016-11-16 MED ORDER — DOPAMINE-DEXTROSE 3.2-5 MG/ML-% IV SOLN
0.0000 ug/kg/min | INTRAVENOUS | Status: DC
  Filled 2016-11-16: qty 250

## 2016-11-16 MED ORDER — MAGNESIUM SULFATE 50 % IJ SOLN
40.0000 meq | INTRAMUSCULAR | Status: DC
  Filled 2016-11-16: qty 10

## 2016-11-16 MED ORDER — VANCOMYCIN HCL 10 G IV SOLR
1500.0000 mg | INTRAVENOUS | Status: DC
  Filled 2016-11-16: qty 1500

## 2016-11-16 MED ORDER — TEMAZEPAM 15 MG PO CAPS: 15.0000 mg | ORAL_CAPSULE | Freq: Once | ORAL | Status: DC | PRN

## 2016-11-16 MED ORDER — PLASMA-LYTE 148 IV SOLN
INTRAVENOUS | Status: DC
  Filled 2016-11-16: qty 2.5

## 2016-11-16 MED ORDER — CHLORHEXIDINE GLUCONATE 4 % EX LIQD
60.0000 mL | Freq: Once | CUTANEOUS | Status: AC
  Administered 2016-11-17: 4 via TOPICAL
  Filled 2016-11-16: qty 60

## 2016-11-16 MED ORDER — CHLORHEXIDINE GLUCONATE 0.12 % MT SOLN
15.0000 mL | Freq: Once | OROMUCOSAL | Status: AC
  Administered 2016-11-17: 15 mL via OROMUCOSAL
  Filled 2016-11-16: qty 15

## 2016-11-16 NOTE — Progress Notes (Signed)
4 Days Post-Op Procedure(s) (LRB): Left Heart Cath and Coronary Angiography (N/A) Subjective: Stable on iv heparin  planCABG in am- procedure, indications and risks have been reviewed with patient. He agrees to procede with surgery  Objective: Vital signs in last 24 hours: Temp:  [97.6 F (36.4 C)-97.9 F (36.6 C)] 97.9 F (36.6 C) (07/09 1300) Pulse Rate:  [56-72] 56 (07/09 0504) Cardiac Rhythm: Normal sinus rhythm (07/09 0822) Resp:  [16-18] 18 (07/09 1300) BP: (101-146)/(67-89) 111/71 (07/09 1641) SpO2:  [96 %-98 %] 98 % (07/09 1300) Weight:  [208 lb 3.2 oz (94.4 kg)] 208 lb 3.2 oz (94.4 kg) (07/09 0504)  Hemodynamic parameters for last 24 hours:  stable  Intake/Output from previous day: 07/08 0701 - 07/09 0700 In: 954 [P.O.:842; I.V.:112] Out: 1350 [Urine:1350] Intake/Output this shift: Total I/O In: 760 [P.O.:760] Out: 1700 [Urine:1700]       Exam    General- alert and comfortable   Lungs- clear without rales, wheezes   Cor- regular rate and rhythm, no murmur , gallop   Abdomen- soft, non-tender   Extremities - warm, non-tender, minimal edema   Neuro- oriented, appropriate, no focal weakness   Lab Results:  Recent Labs  11/15/16 0436 11/16/16 0246  WBC 7.4 7.8  HGB 14.3 14.0  HCT 42.3 40.8  PLT 171 147*   BMET:  Recent Labs  11/15/16 0436 11/16/16 0246  NA 137 138  K 3.8 3.7  CL 107 107  CO2 22 24  GLUCOSE 196* 208*  BUN 13 10  CREATININE 0.79 0.90  CALCIUM 8.6* 8.7*    PT/INR: No results for input(s): LABPROT, INR in the last 72 hours. ABG No results found for: PHART, HCO3, TCO2, ACIDBASEDEF, O2SAT CBG (last 3)   Recent Labs  11/16/16 0911 11/16/16 1234 11/16/16 1606  GLUCAP 259* 138* 146*    Assessment/Plan: S/P Procedure(s) (LRB): Left Heart Cath and Coronary Angiography (N/A) Multivessel CABG in am   LOS: 3 days    [name] [name] 11/16/2016

## 2016-11-16 NOTE — Progress Notes (Signed)
ANTICOAGULATION CONSULT NOTE - Follow Up Consult  Pharmacy Consult for Heparin Indication: 3VCAD  No Known Allergies  Patient Measurements: Height: 5\' 11"  (180.3 cm) Weight: 208 lb 3.2 oz (94.4 kg) IBW/kg (Calculated) : 75.3 Heparin Dosing Weight:    Vital Signs: Temp: 97.7 F (36.5 C) (07/09 0504) Temp Source: Axillary (07/09 0504) BP: 101/67 (07/09 0504) Pulse Rate: 56 (07/09 0504)  Labs:  Recent Labs  11/14/16 0202 11/15/16 0436 11/16/16 0246  HGB 13.8 14.3 14.0  HCT 40.7 42.3 40.8  PLT 149* 171 147*  HEPARINUNFRC 0.53 0.34 0.40  CREATININE 0.80 0.79 0.90    Estimated Creatinine Clearance: 107.5 mL/min (by C-G formula based on SCr of 0.9 mg/dL).  Assessment:  Anticoag: Hep for CAD awaiting CABG, CBC stable. HL 0.4. No patient bleeding noted.  Goal of Therapy:  Heparin level 0.3-0.7 units/ml Monitor platelets by anticoagulation protocol: Yes   Plan:  Continue Heparin at  1400 units/hr Daily HL and CBC CABG 7/10   Alexandra Lipps S. Merilynn Finlandobertson, PharmD, BCPS Clinical Staff Pharmacist Pager [phone]  Misty Stanleyobertson, Celestina Gironda Stillinger 11/16/2016,8:22 AM

## 2016-11-16 NOTE — Progress Notes (Signed)
Progress Note  Patient Name: [name] [name] D Duca Date of Encounter: 11/16/2016  Primary Cardiologist: Jens Somrenshaw  Subjective   Currently waiting for CABG tomorrow. No chest pain, no shortness of breath. Ambulating well. Has computer monitor set up next to his bed. Try to get some work done.  Inpatient Medications    Scheduled Meds: . aspirin  81 mg Oral Daily  . atorvastatin  80 mg Oral q1800  . bisacodyl  5 mg Oral Once  . chlorhexidine  60 mL Topical Once   And  . [START ON 11/17/2016] chlorhexidine  60 mL Topical Once  . [START ON 11/17/2016] chlorhexidine  15 mL Mouth/Throat Once  . clotrimazole   Topical BID  . [START ON 11/17/2016] diazepam  5 mg Oral Once  . gabapentin  600 mg Oral TID  . [START ON 11/17/2016] heparin-papaverine-plasmalyte irrigation   Irrigation To OR  . insulin aspart  0-15 Units Subcutaneous TID WC  . insulin aspart  5 Units Subcutaneous TID WC  . insulin glargine  45 Units Subcutaneous Daily  . lisinopril  5 mg Oral Daily  . [START ON 11/17/2016] magnesium sulfate  40 mEq Other To OR  . metoprolol tartrate  12.5 mg Oral BID  . [START ON 11/17/2016] metoprolol tartrate  12.5 mg Oral Once  . [START ON 11/17/2016] potassium chloride  80 mEq Other To OR  . sodium chloride flush  3 mL Intravenous Q12H  . [START ON 11/17/2016] tranexamic acid  15 mg/kg Intravenous To OR  . [START ON 11/17/2016] tranexamic acid  2 mg/kg Intracatheter To OR  . traZODone  100 mg Oral QHS   Continuous Infusions: . sodium chloride    . [START ON 11/17/2016] cefUROXime (ZINACEF)  IV    . [START ON 11/17/2016] cefUROXime (ZINACEF)  IV    . [START ON 11/17/2016] dexmedetomidine    . [START ON 11/17/2016] DOPamine    . [START ON 11/17/2016] epinephrine    . [START ON 11/17/2016] heparin 30,000 units/NS 1000 mL solution for CELLSAVER    . heparin 1,400 Units/hr (11/15/16 2111)  . [START ON 11/17/2016] insulin (NOVOLIN-R) infusion    . nitroGLYCERIN Stopped (11/12/16 2019)  . [START ON  11/17/2016] nitroGLYCERIN    . [START ON 11/17/2016] phenylephrine 20mg /22050mL NS (0.08mg /ml) infusion    . [START ON 11/17/2016] tranexamic acid (CYKLOKAPRON) infusion ([facility])    . [START ON 11/17/2016] vancomycin     PRN Meds: sodium chloride, acetaminophen, diazepam, guaiFENesin-dextromethorphan, hydrALAZINE, hydrALAZINE, morphine injection, ondansetron (ZOFRAN) IV, oxyCODONE-acetaminophen, sodium chloride flush, zolpidem   Vital Signs    Vitals:   11/15/16 0832 11/15/16 1138 11/15/16 2031 11/16/16 0504  BP:  122/75 130/86 101/67  Pulse:  75 72 (!) 56  Resp:  19 16 16   Temp:  98.1 F (36.7 C) 97.6 F (36.4 C) 97.7 F (36.5 C)  TempSrc:  Oral Oral Axillary  SpO2: 99% 99% 96% 96%  Weight:    208 lb 3.2 oz (94.4 kg)  Height:        Intake/Output Summary (Last 24 hours) at 11/16/16 0903 Last data filed at 11/16/16 0800  Gross per 24 hour  Intake              954 ml  Output             1750 ml  Net             -796 ml   Filed Weights   11/14/16 0429 11/15/16 0458 11/16/16 [id number]  Weight: 210 lb 6.4 oz (95.4 kg) 208 lb 8 oz (94.6 kg) 208 lb 3.2 oz (94.4 kg)    Telemetry    Sinus rhythm - Personally Reviewed  ECG      Physical Exam   GEN: Well nourished, well developed, in no acute distress  HEENT: normal  Neck: no JVD, carotid bruits, or masses Cardiac: RRR; no murmurs, rubs, or gallops,no edema  Respiratory:  clear to auscultation bilaterally, normal work of breathing GI: soft, nontender, nondistended, + BS MS: no deformity or atrophy  Skin: warm and dry, no rash Neuro:  Alert and Oriented x 3, Strength and sensation are intact Psych: euthymic mood, full affect   Labs    Chemistry  Recent Labs Lab 11/14/16 0202 11/15/16 0436 11/16/16 0246  NA 138 137 138  K 3.8 3.8 3.7  CL 108 107 107  CO2 24 22 24   GLUCOSE 134* 196* 208*  BUN 12 13 10   CREATININE 0.80 0.79 0.90  CALCIUM 8.8* 8.6* 8.7*  GFRNONAA >60 >60 >60  GFRAA >60 >60 >60  ANIONGAP 6 8 7       Hematology  Recent Labs Lab 11/14/16 0202 11/15/16 0436 11/16/16 0246  WBC 8.4 7.4 7.8  RBC 4.60 4.71 4.61  HGB 13.8 14.3 14.0  HCT 40.7 42.3 40.8  MCV 88.5 89.8 88.5  MCH 30.0 30.4 30.4  MCHC 33.9 33.8 34.3  RDW 13.6 13.6 13.7  PLT 149* 171 147*    Cardiac Enzymes  Recent Labs Lab 11/12/16 0914 11/12/16 1139 11/12/16 1423 11/12/16 2249  TROPONINI <0.03 <0.03 <0.03 <0.03     Recent Labs Lab 11/12/16 0702  TROPIPOC 0.01     BNPNo results for input(s): BNP, PROBNP in the last 168 hours.   DDimer   Recent Labs Lab 11/12/16 9811  DDIMER <0.27     Radiology    No results found.  Cardiac Studies   Coronary angiography (11/12/16):   Ost RCA to Dist RCA lesion, 80 %stenosed.  Mid RCA to Dist RCA lesion, 100 %stenosed.  Prox LAD to Mid LAD lesion, 75 %stenosed.  Mid LAD to Dist LAD lesion, 75 %stenosed.  Ost 1st Mrg to 1st Mrg lesion, 50 %stenosed.  2nd Mrg lesion, 99 %stenosed.  Ost Ramus to Ramus lesion, 75 %stenosed.  Ost 1st Diag to 1st Diag lesion, 80 %stenosed.  The left ventricular systolic function is normal.  LV end diastolic pressure is normal.  The left ventricular ejection fraction is 55-65% by visual estimate.   Echo (11/12/16):  - Left ventricle: The cavity size was normal. Wall thickness was increased in a pattern of mild LVH. Systolic function was normal. The estimated ejection fraction was in the range of 60% to 65%. Wall motion was normal; there were no regional wall motion abnormalities. Features are consistent with a pseudonormal left ventricular filling pattern, with concomitant abnormal relaxation and increased filling pressure (grade 2 diastolic dysfunction).   Patient Profile     56 y.o. male with past medical history of coronary artery disease, diabetes mellitus, hypertension, hyperlipidemia with unstable angina. Patient had PCI of his right coronary artery in 2005 in the setting of an acute  inferior infarct. Catheterization 11/12/16 showed severe three-vessel coronary artery disease, normal LV function. Echocardiogram showed normal LV function and moderate diastolic dysfunction.  Assessment & Plan    1. Unstable angina with severe 3-vessel CAD: Severe three-vessel coronary artery disease as described above with normal ejection fraction. No symptoms currently. Ambulating well.  Plan for CABG  on 11/17/16. Continue ASA, IV heparin, metoprolol, lisinopril, and statin. No changes made in medications. Platelets are stable.  2. Hypertension: Controlled. No changes made. Continue to monitor.  3. Hyperlipidemia: Continue statin. High intensity dose.  4. Diastolic dysfunction: No signs of CHF. BP is controlled. Doing well. Stable.  5. Type 2 diabetes: On insulin glargine and aspart. CAD equivalent.  6. Erectile dysfunction: This is an independent risk factor for CAD. Medical therapy can be initiated at time of cardiac rehab. He questioned about this during his encounter with Dr. Purvis Sheffield.   Signed, Donato Schultz, MD  11/16/2016, 9:03 AM

## 2016-11-16 NOTE — Progress Notes (Signed)
Inpatient Diabetes Program Recommendations  AACE/ADA: New Consensus Statement on Inpatient Glycemic Control (2015)  Target Ranges:  Prepandial:   less than 140 mg/dL      Peak postprandial:   less than 180 mg/dL (1-2 hours)      Critically ill patients:  140 - 180 mg/dL  Results for [name] [name], [name] [name] (MRN [id number]) as of 11/16/2016 15:04  Ref. Range 11/15/2016 07:35 11/15/2016 11:37 11/15/2016 17:25 11/15/2016 21:02 11/16/2016 09:11 11/16/2016 12:34  Glucose-Capillary Latest Ref Range: 65 - 99 mg/dL 409182 (H) 811170 (H) 914209 (H) 173 (H) 259 (H) 138 (H)    Review of Glycemic Control  Diabetes history: DM2 Outpatient Diabetes medications: Toujeo 40 units daily Current orders for Inpatient glycemic control: Lantus 45 units daily, Novolog 0-15 units TID with meals, Novolog 5 units TID with meals for meal coverage  Inpatient Diabetes Program Recommendations: HgbA1C: A1C 12% on 11/13/16 indicataing an average glucose of 298 mg/dl over the past 2-3 months. Encouraged patient to talk with PCP about being referred to an Endocrinologist for improve DM control.  NOTE: Spoke with patient about diabetes and home regimen for diabetes control. Patient reports that he is followed by PCP for diabetes management and currently he takes Toujeo 40 units daily as an outpatient for diabetes control. Patient reports that he is taking insulin as prescribed.  Patient reports that no changes have been made with his insulin in over 1 year. Patient states that he checks his glucose 2-3 times per day and that it is usually in the 200's mg/dl.  Discussed A1C results (12.0% on 11/13/16) and explained that his current A1C indicates an average glucose of 298 mg/dl over the past 2-3 months. Discussed glucose and A1C goals. Discussed importance of checking CBGs and maintaining good CBG control to prevent long-term and short-term complications. Explained how hyperglycemia leads to damage within blood vessels which lead to the common complications seen  with uncontrolled diabetes. Stressed to the patient the importance of improving glycemic control to prevent further complications from uncontrolled diabetes. Discussed impact of nutrition, exercise, stress, sickness, and medications on diabetes control. Patient reports that he feels he does not understand Carb Modified diet and is uncertain about what he should be eating.  Briefly discussed carbohydrate modified diet. Asked patient if he felt he would benefit from RD consult and he states that he never received much education on diet and would like RD to talk with him while inpatient. Placed order for RD consult.  Discussed TCTS protocol with insulin drip and transition from IV insulin back to SQ and stressed importance of glycemic control while inpatient. Encouraged patient to talk with his PCP about referring him to an Endocrinologist to help with DM management to improve DM control. Informed patient that a Living Well with Diabetes booklet would be ordered for additional DM knowledge and encouraged patient to read once it is received.  Patient verbalized understanding of information discussed and he states that he has no further questions at this time related to diabetes.  Thanks, [name], RN, MSN, CDE Diabetes Coordinator Inpatient Diabetes Program [phone] (Team Pager)

## 2016-11-16 NOTE — Plan of Care (Signed)
Problem: Pain Managment: Goal: General experience of comfort will improve Outcome: Progressing CP free. C/O chronic pain in back. Oral analgesic effective.

## 2016-11-16 NOTE — Progress Notes (Signed)
Patient ID: [name] [name], male   DOB: 12/10/1960, 56 y.o.   MRN: [id number]  PROGRESS NOTE    [name] [name]  NWG:[id number] DOB: 02/21/1961 DOA: 11/12/2016 PCP: [name], [name], MD   Brief Narrative:  56 year old male with history of sleep apnea, coronary artery disease and MI, diabetes mellitus presented with chest pain. She was evaluated by cardiology and had cardiac catheterization done on 11/12/2016 which showed severe three-vessel coronary artery disease. CT surgery has seen the patient in consultation and planning for CABG.   Assessment & Plan:   Principal Problem:   Chest pain Active Problems:   DM type 2, uncontrolled, with neuropathy (HCC)   Chronic low back pain   Hypertension associated with diabetes (HCC)   Hyperlipidemia associated with type 2 diabetes mellitus (HCC)   Unstable angina (HCC)   Coronary artery disease involving native coronary artery of native heart with unstable angina pectoris (HCC)   CP due to unstable angina - Status post cardiac catheterization on 11/12/2016 showing severe three-vessel coronary artery disease. CT surgery has already seen the patient and is planning for CABG on 11/17/2016 - Patient has slight chest pain this morning. Currently chest pain-free and off nitroglycerin drip. Continue aspirin, statin, lisinopril and metoprolol.  Severe three-vessel coronary artery disease - Echo showed ejection fraction of 60-65% with grade 2 diastolic dysfunction  Hyperlipidemia Continue statin  Chronic low back pain Continue gabapentin  Diabetes Continue Lantus 45units daily and NovoLog 5 units 3 times a day with meals SSI HbA1C is 12  Insomnia Continue trazodone    DVT prophylaxis:Heparin drip Code Status:Full code Family Communication:None at bedside Disposition Plan:depends on the outcome of CABG Consultants:Cardiology and CT surgery   Procedures: Cardiac catheterization on 11/12/2016 showed severe  three-vessel coronary artery disease Echo on 11/12/2016:- Left ventricle: The cavity size was normal. Wall thickness was increased in a pattern of mild LVH. Systolic function was normal. The estimated ejection fraction was in the range of 60% to 65%. Wall motion was normal; there were no regional wall motion abnormalities. Features are consistent with a pseudonormal left ventricular filling pattern, with concomitant abnormal relaxation and increased filling pressure (grade 2 diastolic dysfunction).  Antimicrobials: None  Subjective: Patient seen and examined at bedside. She denies any current chest pain, nausea, vomiting. He has slight chest pain this morning  Objective: Vitals:   11/15/16 1138 11/15/16 2031 11/16/16 0504 11/16/16 0942  BP: 122/75 130/86 101/67 (!) 146/89  Pulse: 75 72 (!) 56   Resp: 19 16 16    Temp: 98.1 F (36.7 C) 97.6 F (36.4 C) 97.7 F (36.5 C)   TempSrc: Oral Oral Axillary   SpO2: 99% 96% 96%   Weight:   94.4 kg (208 lb 3.2 oz)   Height:        Intake/Output Summary (Last 24 hours) at 11/16/16 1020 Last data filed at 11/16/16 0900  Gross per 24 hour  Intake              954 ml  Output             1450 ml  Net             -496 ml   Filed Weights   11/14/16 0429 11/15/16 0458 11/16/16 0504  Weight: 95.4 kg (210 lb 6.4 oz) 94.6 kg (208 lb 8 oz) 94.4 kg (208 lb 3.2 oz)    Examination:  General exam: Appears calm and comfortable  Respiratory system: Bilateral decreased breath sound at bases  Cardiovascular system: S1 & S2 heard, Rate controlled  Gastrointestinal system: Abdomen is nondistended, soft and nontender. Normal bowel sounds heard. Extremities: No cyanosis, clubbing, edema    Data Reviewed: I have personally reviewed following labs and imaging studies  CBC:  Recent Labs Lab 11/12/16 2100 11/13/16 0359 11/14/16 0202 11/15/16 0436 11/16/16 0246  WBC 8.9 8.5 8.4 7.4 7.8  NEUTROABS 4.7  --   --   --   --   HGB 14.2  14.2 13.8 14.3 14.0  HCT 41.5 41.7 40.7 42.3 40.8  MCV 88.5 88.3 88.5 89.8 88.5  PLT 189 150 149* 171 147*   Basic Metabolic Panel:  Recent Labs Lab 11/12/16 0635 11/12/16 0914 11/13/16 0359 11/14/16 0202 11/15/16 0436 11/16/16 0246  NA 138  --  138 138 137 138  K 3.8  --  3.5 3.8 3.8 3.7  CL 104  --  108 108 107 107  CO2 23  --  23 24 22 24   GLUCOSE 267*  --  185* 134* 196* 208*  BUN 14  --  17 12 13 10   CREATININE 0.85 0.86 0.89 0.80 0.79 0.90  CALCIUM 9.3  --  8.3* 8.8* 8.6* 8.7*  MG  --   --   --  2.1 2.3 2.1   GFR: Estimated Creatinine Clearance: 107.5 mL/min (by C-G formula based on SCr of 0.9 mg/dL). Liver Function Tests: No results for input(s): AST, ALT, ALKPHOS, BILITOT, PROT, ALBUMIN in the last 168 hours. No results for input(s): LIPASE, AMYLASE in the last 168 hours. No results for input(s): AMMONIA in the last 168 hours. Coagulation Profile:  Recent Labs Lab 11/13/16 0359  INR 1.07   Cardiac Enzymes:  Recent Labs Lab 11/12/16 0914 11/12/16 1139 11/12/16 1423 11/12/16 2249  TROPONINI <0.03 <0.03 <0.03 <0.03   BNP (last 3 results) No results for input(s): PROBNP in the last 8760 hours. HbA1C: No results for input(s): HGBA1C in the last 72 hours. CBG:  Recent Labs Lab 11/15/16 0735 11/15/16 1137 11/15/16 1725 11/15/16 2102 11/16/16 0911  GLUCAP 182* 170* 209* 173* 259*   Lipid Profile: No results for input(s): CHOL, HDL, LDLCALC, TRIG, CHOLHDL, LDLDIRECT in the last 72 hours. Thyroid Function Tests: No results for input(s): TSH, T4TOTAL, FREET4, T3FREE, THYROIDAB in the last 72 hours. Anemia Panel: No results for input(s): VITAMINB12, FOLATE, FERRITIN, TIBC, IRON, RETICCTPCT in the last 72 hours. Sepsis Labs:  Recent Labs Lab 11/12/16 2249  LATICACIDVEN 1.8    Recent Results (from the past 240 hour(s))  Surgical pcr screen     Status: None   Collection Time: 11/12/16  7:56 PM  Result Value Ref Range Status   MRSA, PCR  NEGATIVE NEGATIVE Final   Staphylococcus aureus NEGATIVE NEGATIVE Final    Comment:        The Xpert SA Assay (FDA approved for NASAL specimens in patients over 48 years of age), is one component of a comprehensive surveillance program.  Test performance has been validated by [facility] for patients greater than or equal to 49 year old. It is not intended to diagnose infection nor to guide or monitor treatment.          Radiology Studies: No results found.      Scheduled Meds: . aspirin  81 mg Oral Daily  . atorvastatin  80 mg Oral q1800  . chlorhexidine  60 mL Topical Once   And  . [START ON 11/17/2016] chlorhexidine  60 mL Topical Once  . [START ON  11/17/2016] chlorhexidine  15 mL Mouth/Throat Once  . clotrimazole   Topical BID  . [START ON 11/17/2016] diazepam  5 mg Oral Once  . gabapentin  600 mg Oral TID  . [START ON 11/17/2016] heparin-papaverine-plasmalyte irrigation   Irrigation To OR  . insulin aspart  0-15 Units Subcutaneous TID WC  . insulin aspart  5 Units Subcutaneous TID WC  . insulin glargine  45 Units Subcutaneous Daily  . lisinopril  5 mg Oral Daily  . [START ON 11/17/2016] magnesium sulfate  40 mEq Other To OR  . metoprolol tartrate  12.5 mg Oral BID  . [START ON 11/17/2016] metoprolol tartrate  12.5 mg Oral Once  . [START ON 11/17/2016] potassium chloride  80 mEq Other To OR  . sodium chloride flush  3 mL Intravenous Q12H  . [START ON 11/17/2016] tranexamic acid  15 mg/kg Intravenous To OR  . [START ON 11/17/2016] tranexamic acid  2 mg/kg Intracatheter To OR  . traZODone  100 mg Oral QHS   Continuous Infusions: . sodium chloride    . [START ON 11/17/2016] cefUROXime (ZINACEF)  IV    . [START ON 11/17/2016] cefUROXime (ZINACEF)  IV    . [START ON 11/17/2016] dexmedetomidine    . [START ON 11/17/2016] DOPamine    . [START ON 11/17/2016] epinephrine    . [START ON 11/17/2016] heparin 30,000 units/NS 1000 mL solution for CELLSAVER    . heparin 1,400  Units/hr (11/15/16 2111)  . [START ON 11/17/2016] insulin (NOVOLIN-R) infusion    . nitroGLYCERIN Stopped (11/12/16 2019)  . [START ON 11/17/2016] nitroGLYCERIN    . [START ON 11/17/2016] phenylephrine 20mg /214mL NS (0.08mg /ml) infusion    . [START ON 11/17/2016] tranexamic acid (CYKLOKAPRON) infusion ([facility])    . [START ON 11/17/2016] vancomycin       LOS: 3 days        Glade Lloyd, MD [facility] Pager [phone]  If 7PM-7AM, please contact night-coverage [email or web address] Password [email or web address] 11/16/2016, 10:20 AM

## 2016-11-16 NOTE — Care Management Note (Signed)
Case Management Note  Patient Details  Name: [name] [name] MRN: [id number] Date of Birth: 01/27/1961  Subjective/Objective: Pt presented for Chest Pain- s/p cardiac cath 11-12-16 that revealed severe three vessel CAD. Plan for CABG 11-17-16. Pt is from home with girlfriend and she will be home from 8 am-2 pm M-F. Pt will then have support of his sister after 2 pm. PTA- pt was independent. PCP: [name], [name], MD                      Action/Plan: CM will continue to monitor for additional home needs post procedure.   Expected Discharge Date:                  Expected Discharge Plan:  Home w Home Health Services  In-House Referral:     Discharge planning Services  CM Consult  Post Acute Care Choice:    Choice offered to:     DME Arranged:    DME Agency:     HH Arranged:    HH Agency:     Status of Service:  In process, will continue to follow  If discussed at Long Length of Stay Meetings, dates discussed:    Additional Comments:  [name], Aliya Sol Kaye, RN 11/16/2016, 11:26 AM

## 2016-11-16 NOTE — Progress Notes (Signed)
CARDIAC REHAB PHASE I   Pt ambulating independently, declines ambulation at this time. Cardiac surgery pre-op education completed. Reviewed IS, sternal precautions, activity progression, cardiac surgery booklet and cardiac surgery guidelines. Pt verbalized understanding. Pt in bed, call bell within reach. Will follow post-op.   [id number] Joylene GrapesEmily C Kemper Heupel, RN, BSN 11/16/2016 9:39 AM

## 2016-11-16 NOTE — Progress Notes (Addendum)
Chaplain received a page that patient would like to complete Advanced Directive.  Chaplain came up and affirmed with patient and called necessary agents to get completion started.  Advanced Directive complete with Elise Bennemy Munson, notary and two volunteers witnessing.  Copy placed on chart and in Medical records.  Original and copy given to patient.    11/16/16 1414  Clinical Encounter Type  Visited With Patient  Visit Type Initial;Social support  Referral From Nurse  Consult/Referral To Chaplain

## 2016-11-17 ENCOUNTER — Inpatient Hospital Stay (HOSPITAL_COMMUNITY): Payer: Medicare Other

## 2016-11-17 ENCOUNTER — Inpatient Hospital Stay (HOSPITAL_COMMUNITY): Payer: Medicare Other | Admitting: Certified Registered Nurse Anesthetist

## 2016-11-17 ENCOUNTER — Encounter (HOSPITAL_COMMUNITY)
Admission: EM | Disposition: A | Discharge status: Home / Self Care | Payer: Self-pay | Source: Home / Self Care | Attending: Cardiothoracic Surgery

## 2016-11-17 DIAGNOSIS — I2510 Atherosclerotic heart disease of native coronary artery without angina pectoris: Secondary | ICD-10-CM | POA: Diagnosis present

## 2016-11-17 HISTORY — PX: CORONARY ARTERY BYPASS GRAFT: SHX141

## 2016-11-17 HISTORY — PX: TEE WITHOUT CARDIOVERSION: SHX5443

## 2016-11-17 LAB — POCT I-STAT 3, ART BLOOD GAS (G3+)
Acid-Base Excess: 1 mmol/L (ref 0.0–2.0)
Acid-Base Excess: 1 mmol/L (ref 0.0–2.0)
Acid-Base Excess: 2 mmol/L (ref 0.0–2.0)
Acid-Base Excess: 2 mmol/L (ref 0.0–2.0)
Acid-Base Excess: 1 mmol/L (ref 0.0–2.0)
Acid-base deficit: 2 mmol/L (ref 0.0–2.0)
Acid-base deficit: 3 mmol/L — ABNORMAL HIGH (ref 0.0–2.0)
Acid-base deficit: 11 mmol/L — ABNORMAL HIGH (ref 0.0–2.0)
Acid-base deficit: 1 mmol/L (ref 0.0–2.0)
Bicarbonate: 26.5 mmol/L (ref 20.0–28.0)
Bicarbonate: 26.2 mmol/L (ref 20.0–28.0)
Bicarbonate: 27 mmol/L (ref 20.0–28.0)
Bicarbonate: 26.4 mmol/L (ref 20.0–28.0)
Bicarbonate: 25.8 mmol/L (ref 20.0–28.0)
Bicarbonate: 26.3 mmol/L (ref 20.0–28.0)
Bicarbonate: 23.8 mmol/L (ref 20.0–28.0)
Bicarbonate: 22.6 mmol/L (ref 20.0–28.0)
Bicarbonate: 13.4 mmol/L — ABNORMAL LOW (ref 20.0–28.0)
Bicarbonate: 23.4 mmol/L (ref 20.0–28.0)
O2 Saturation: 100 %
O2 Saturation: 100 %
O2 Saturation: 100 %
O2 Saturation: 100 %
O2 Saturation: 100 %
O2 Saturation: 100 %
O2 Saturation: 97 %
O2 Saturation: 91 %
O2 Saturation: 92 %
O2 Saturation: 98 %
Patient temperature: 36.7
Patient temperature: 38.2
Patient temperature: 38.5
TCO2: 28 mmol/L (ref 0–100)
TCO2: 27 mmol/L (ref 0–100)
TCO2: 28 mmol/L (ref 0–100)
TCO2: 28 mmol/L (ref 0–100)
TCO2: 27 mmol/L (ref 0–100)
TCO2: 28 mmol/L (ref 0–100)
TCO2: 25 mmol/L (ref 0–100)
TCO2: 24 mmol/L (ref 0–100)
TCO2: 14 mmol/L (ref 0–100)
TCO2: 24 mmol/L (ref 0–100)
pCO2 arterial: 45.7 mmHg (ref 32.0–48.0)
pCO2 arterial: 39 mmHg (ref 32.0–48.0)
pCO2 arterial: 47.2 mmHg (ref 32.0–48.0)
pCO2 arterial: 41.6 mmHg (ref 32.0–48.0)
pCO2 arterial: 41.4 mmHg (ref 32.0–48.0)
pCO2 arterial: 49.3 mmHg — ABNORMAL HIGH (ref 32.0–48.0)
pCO2 arterial: 43.3 mmHg (ref 32.0–48.0)
pCO2 arterial: 42 mmHg (ref 32.0–48.0)
pCO2 arterial: 24.8 mmHg — ABNORMAL LOW (ref 32.0–48.0)
pCO2 arterial: 39 mmHg (ref 32.0–48.0)
pH, Arterial: 7.372 (ref 7.350–7.450)
pH, Arterial: 7.365 (ref 7.350–7.450)
pH, Arterial: 7.435 (ref 7.350–7.450)
pH, Arterial: 7.41 (ref 7.350–7.450)
pH, Arterial: 7.335 — ABNORMAL LOW (ref 7.350–7.450)
pH, Arterial: 7.402 (ref 7.350–7.450)
pH, Arterial: 7.348 — ABNORMAL LOW (ref 7.350–7.450)
pH, Arterial: 7.338 — ABNORMAL LOW (ref 7.350–7.450)
pH, Arterial: 7.345 — ABNORMAL LOW (ref 7.350–7.450)
pH, Arterial: 7.392 (ref 7.350–7.450)
pO2, Arterial: 321 mmHg — ABNORMAL HIGH (ref 83.0–108.0)
pO2, Arterial: 320 mmHg — ABNORMAL HIGH (ref 83.0–108.0)
pO2, Arterial: 354 mmHg — ABNORMAL HIGH (ref 83.0–108.0)
pO2, Arterial: 334 mmHg — ABNORMAL HIGH (ref 83.0–108.0)
pO2, Arterial: 281 mmHg — ABNORMAL HIGH (ref 83.0–108.0)
pO2, Arterial: 309 mmHg — ABNORMAL HIGH (ref 83.0–108.0)
pO2, Arterial: 95 mmHg (ref 83.0–108.0)
pO2, Arterial: 64 mmHg — ABNORMAL LOW (ref 83.0–108.0)
pO2, Arterial: 68 mmHg — ABNORMAL LOW (ref 83.0–108.0)
pO2, Arterial: 108 mmHg (ref 83.0–108.0)

## 2016-11-17 LAB — POCT I-STAT, CHEM 8
BUN: 9 mg/dL (ref 6–20)
BUN: 8 mg/dL (ref 6–20)
BUN: 9 mg/dL (ref 6–20)
BUN: 7 mg/dL (ref 6–20)
BUN: 8 mg/dL (ref 6–20)
BUN: 7 mg/dL (ref 6–20)
BUN: 7 mg/dL (ref 6–20)
BUN: 7 mg/dL (ref 6–20)
BUN: 6 mg/dL (ref 6–20)
BUN: <3 mg/dL — ABNORMAL LOW (ref 6–20)
Calcium, Ion: 1.21 mmol/L (ref 1.15–1.40)
Calcium, Ion: 1.06 mmol/L — ABNORMAL LOW (ref 1.15–1.40)
Calcium, Ion: 1.23 mmol/L (ref 1.15–1.40)
Calcium, Ion: 1.05 mmol/L — ABNORMAL LOW (ref 1.15–1.40)
Calcium, Ion: 1.06 mmol/L — ABNORMAL LOW (ref 1.15–1.40)
Calcium, Ion: 1.05 mmol/L — ABNORMAL LOW (ref 1.15–1.40)
Calcium, Ion: 1.07 mmol/L — ABNORMAL LOW (ref 1.15–1.40)
Calcium, Ion: 1.1 mmol/L — ABNORMAL LOW (ref 1.15–1.40)
Calcium, Ion: 0.91 mmol/L — ABNORMAL LOW (ref 1.15–1.40)
Calcium, Ion: 0.92 mmol/L — ABNORMAL LOW (ref 1.15–1.40)
Chloride: 106 mmol/L (ref 101–111)
Chloride: 103 mmol/L (ref 101–111)
Chloride: 106 mmol/L (ref 101–111)
Chloride: 101 mmol/L (ref 101–111)
Chloride: 103 mmol/L (ref 101–111)
Chloride: 103 mmol/L (ref 101–111)
Chloride: 104 mmol/L (ref 101–111)
Chloride: 103 mmol/L (ref 101–111)
Chloride: 103 mmol/L (ref 101–111)
Chloride: 115 mmol/L — ABNORMAL HIGH (ref 101–111)
Creatinine, Ser: 0.4 mg/dL — ABNORMAL LOW (ref 0.61–1.24)
Creatinine, Ser: 0.5 mg/dL — ABNORMAL LOW (ref 0.61–1.24)
Creatinine, Ser: 0.6 mg/dL — ABNORMAL LOW (ref 0.61–1.24)
Creatinine, Ser: 0.4 mg/dL — ABNORMAL LOW (ref 0.61–1.24)
Creatinine, Ser: 0.4 mg/dL — ABNORMAL LOW (ref 0.61–1.24)
Creatinine, Ser: 0.4 mg/dL — ABNORMAL LOW (ref 0.61–1.24)
Creatinine, Ser: 0.4 mg/dL — ABNORMAL LOW (ref 0.61–1.24)
Creatinine, Ser: 0.4 mg/dL — ABNORMAL LOW (ref 0.61–1.24)
Creatinine, Ser: 0.5 mg/dL — ABNORMAL LOW (ref 0.61–1.24)
Creatinine, Ser: 0.4 mg/dL — ABNORMAL LOW (ref 0.61–1.24)
Glucose, Bld: 233 mg/dL — ABNORMAL HIGH (ref 65–99)
Glucose, Bld: 189 mg/dL — ABNORMAL HIGH (ref 65–99)
Glucose, Bld: 215 mg/dL — ABNORMAL HIGH (ref 65–99)
Glucose, Bld: 179 mg/dL — ABNORMAL HIGH (ref 65–99)
Glucose, Bld: 184 mg/dL — ABNORMAL HIGH (ref 65–99)
Glucose, Bld: 181 mg/dL — ABNORMAL HIGH (ref 65–99)
Glucose, Bld: 188 mg/dL — ABNORMAL HIGH (ref 65–99)
Glucose, Bld: 204 mg/dL — ABNORMAL HIGH (ref 65–99)
Glucose, Bld: 189 mg/dL — ABNORMAL HIGH (ref 65–99)
Glucose, Bld: 98 mg/dL (ref 65–99)
HCT: 37 % — ABNORMAL LOW (ref 39.0–52.0)
HCT: 30 % — ABNORMAL LOW (ref 39.0–52.0)
HCT: 38 % — ABNORMAL LOW (ref 39.0–52.0)
HCT: 30 % — ABNORMAL LOW (ref 39.0–52.0)
HCT: 30 % — ABNORMAL LOW (ref 39.0–52.0)
HCT: 28 % — ABNORMAL LOW (ref 39.0–52.0)
HCT: 30 % — ABNORMAL LOW (ref 39.0–52.0)
HCT: 30 % — ABNORMAL LOW (ref 39.0–52.0)
HCT: 28 % — ABNORMAL LOW (ref 39.0–52.0)
HCT: 27 % — ABNORMAL LOW (ref 39.0–52.0)
Hemoglobin: 12.6 g/dL — ABNORMAL LOW (ref 13.0–17.0)
Hemoglobin: 10.2 g/dL — ABNORMAL LOW (ref 13.0–17.0)
Hemoglobin: 12.9 g/dL — ABNORMAL LOW (ref 13.0–17.0)
Hemoglobin: 10.2 g/dL — ABNORMAL LOW (ref 13.0–17.0)
Hemoglobin: 10.2 g/dL — ABNORMAL LOW (ref 13.0–17.0)
Hemoglobin: 10.2 g/dL — ABNORMAL LOW (ref 13.0–17.0)
Hemoglobin: 9.5 g/dL — ABNORMAL LOW (ref 13.0–17.0)
Hemoglobin: 10.2 g/dL — ABNORMAL LOW (ref 13.0–17.0)
Hemoglobin: 9.5 g/dL — ABNORMAL LOW (ref 13.0–17.0)
Hemoglobin: 9.2 g/dL — ABNORMAL LOW (ref 13.0–17.0)
Potassium: 3.8 mmol/L (ref 3.5–5.1)
Potassium: 4.2 mmol/L (ref 3.5–5.1)
Potassium: 4.2 mmol/L (ref 3.5–5.1)
Potassium: 3.9 mmol/L (ref 3.5–5.1)
Potassium: 4.1 mmol/L (ref 3.5–5.1)
Potassium: 3.9 mmol/L (ref 3.5–5.1)
Potassium: 4.8 mmol/L (ref 3.5–5.1)
Potassium: 4.4 mmol/L (ref 3.5–5.1)
Potassium: 3.4 mmol/L — ABNORMAL LOW (ref 3.5–5.1)
Potassium: 2.8 mmol/L — ABNORMAL LOW (ref 3.5–5.1)
Sodium: 141 mmol/L (ref 135–145)
Sodium: 140 mmol/L (ref 135–145)
Sodium: 141 mmol/L (ref 135–145)
Sodium: 140 mmol/L (ref 135–145)
Sodium: 140 mmol/L (ref 135–145)
Sodium: 140 mmol/L (ref 135–145)
Sodium: 141 mmol/L (ref 135–145)
Sodium: 141 mmol/L (ref 135–145)
Sodium: 143 mmol/L (ref 135–145)
Sodium: 145 mmol/L (ref 135–145)
TCO2: 24 mmol/L (ref 0–100)
TCO2: 26 mmol/L (ref 0–100)
TCO2: 27 mmol/L (ref 0–100)
TCO2: 26 mmol/L (ref 0–100)
TCO2: 26 mmol/L (ref 0–100)
TCO2: 26 mmol/L (ref 0–100)
TCO2: 26 mmol/L (ref 0–100)
TCO2: 26 mmol/L (ref 0–100)
TCO2: 23 mmol/L (ref 0–100)
TCO2: 15 mmol/L (ref 0–100)

## 2016-11-17 LAB — GLUCOSE, CAPILLARY
Glucose-Capillary: 253 mg/dL — ABNORMAL HIGH (ref 65–99)
Glucose-Capillary: 129 mg/dL — ABNORMAL HIGH (ref 65–99)
Glucose-Capillary: 106 mg/dL — ABNORMAL HIGH (ref 65–99)
Glucose-Capillary: 106 mg/dL — ABNORMAL HIGH (ref 65–99)
Glucose-Capillary: 99 mg/dL (ref 65–99)
Glucose-Capillary: 99 mg/dL (ref 65–99)
Glucose-Capillary: 118 mg/dL — ABNORMAL HIGH (ref 65–99)
Glucose-Capillary: 93 mg/dL (ref 65–99)

## 2016-11-17 LAB — HEMOGLOBIN AND HEMATOCRIT, BLOOD
HCT: 30.8 % — ABNORMAL LOW (ref 39.0–52.0)
Hemoglobin: 10.5 g/dL — ABNORMAL LOW (ref 13.0–17.0)

## 2016-11-17 LAB — PLATELET COUNT: Platelets: 113 10*3/uL — ABNORMAL LOW (ref 150–400)

## 2016-11-17 LAB — CBC
HCT: 41.9 % (ref 39.0–52.0)
HCT: 35 % — ABNORMAL LOW (ref 39.0–52.0)
Hemoglobin: 14.4 g/dL (ref 13.0–17.0)
Hemoglobin: 12 g/dL — ABNORMAL LOW (ref 13.0–17.0)
MCH: 30.5 pg (ref 26.0–34.0)
MCH: 30.3 pg (ref 26.0–34.0)
MCHC: 34.4 g/dL (ref 30.0–36.0)
MCHC: 34.3 g/dL (ref 30.0–36.0)
MCV: 88.8 fL (ref 78.0–100.0)
MCV: 88.4 fL (ref 78.0–100.0)
Platelets: 155 10*3/uL (ref 150–400)
Platelets: 153 10*3/uL (ref 150–400)
RBC: 4.72 MIL/uL (ref 4.22–5.81)
RBC: 3.96 MIL/uL — ABNORMAL LOW (ref 4.22–5.81)
RDW: 13.6 % (ref 11.5–15.5)
RDW: 13.9 % (ref 11.5–15.5)
WBC: 7.6 10*3/uL (ref 4.0–10.5)
WBC: 12 10*3/uL — ABNORMAL HIGH (ref 4.0–10.5)

## 2016-11-17 LAB — BASIC METABOLIC PANEL
Anion gap: 7 (ref 5–15)
Anion gap: 4 — ABNORMAL LOW (ref 5–15)
BUN: 11 mg/dL (ref 6–20)
BUN: 7 mg/dL (ref 6–20)
CO2: 25 mmol/L (ref 22–32)
CO2: 21 mmol/L — ABNORMAL LOW (ref 22–32)
Calcium: 8.8 mg/dL — ABNORMAL LOW (ref 8.9–10.3)
Calcium: 7.9 mg/dL — ABNORMAL LOW (ref 8.9–10.3)
Chloride: 105 mmol/L (ref 101–111)
Chloride: 111 mmol/L (ref 101–111)
Creatinine, Ser: 0.93 mg/dL (ref 0.61–1.24)
Creatinine, Ser: 0.64 mg/dL (ref 0.61–1.24)
GFR calc Af Amer: >60 mL/min (ref >60)
GFR calc Af Amer: >60 mL/min (ref >60)
GFR calc non Af Amer: >60 mL/min (ref >60)
GFR calc non Af Amer: >60 mL/min (ref >60)
Glucose, Bld: 251 mg/dL — ABNORMAL HIGH (ref 65–99)
Glucose, Bld: 137 mg/dL — ABNORMAL HIGH (ref 65–99)
Potassium: 3.9 mmol/L (ref 3.5–5.1)
Potassium: 4.3 mmol/L (ref 3.5–5.1)
Sodium: 137 mmol/L (ref 135–145)
Sodium: 136 mmol/L (ref 135–145)

## 2016-11-17 LAB — POCT I-STAT 4, (NA,K, GLUC, HGB,HCT)
Glucose, Bld: 141 mg/dL — ABNORMAL HIGH (ref 65–99)
HCT: 30 % — ABNORMAL LOW (ref 39.0–52.0)
Hemoglobin: 10.2 g/dL — ABNORMAL LOW (ref 13.0–17.0)
Potassium: 3.7 mmol/L (ref 3.5–5.1)
Sodium: 143 mmol/L (ref 135–145)

## 2016-11-17 LAB — MAGNESIUM: Magnesium: 2.7 mg/dL — ABNORMAL HIGH (ref 1.7–2.4)

## 2016-11-17 LAB — APTT: aPTT: 68 seconds — ABNORMAL HIGH (ref 24–36)

## 2016-11-17 LAB — HEPARIN LEVEL (UNFRACTIONATED): Heparin Unfractionated: 0.3 IU/mL (ref 0.30–0.70)

## 2016-11-17 SURGERY — CORONARY ARTERY BYPASS GRAFTING (CABG)
Anesthesia: General | Site: Chest

## 2016-11-17 MED ORDER — SODIUM CHLORIDE 0.9 % IV SOLN: Freq: Once | INTRAVENOUS | Status: DC

## 2016-11-17 MED ORDER — MIDAZOLAM HCL 2 MG/2ML IJ SOLN
INTRAMUSCULAR | Status: AC
  Filled 2016-11-17: qty 2

## 2016-11-17 MED ORDER — FENTANYL CITRATE (PF) 250 MCG/5ML IJ SOLN
INTRAMUSCULAR | Status: AC
  Filled 2016-11-17: qty 25

## 2016-11-17 MED ORDER — LACTATED RINGERS IV SOLN
INTRAVENOUS | Status: DC | PRN
  Administered 2016-11-17 (×3): via INTRAVENOUS

## 2016-11-17 MED ORDER — LACTATED RINGERS IV SOLN: 500.0000 mL | Freq: Once | INTRAVENOUS | Status: DC | PRN

## 2016-11-17 MED ORDER — MAGNESIUM SULFATE 4 GM/100ML IV SOLN
4.0000 g | Freq: Once | INTRAVENOUS | Status: AC
  Administered 2016-11-17: 4 g via INTRAVENOUS
  Filled 2016-11-17: qty 100

## 2016-11-17 MED ORDER — SODIUM CHLORIDE 0.9 % IV SOLN
0.0000 ug/min | INTRAVENOUS | Status: DC
  Administered 2016-11-18: 30 ug/min via INTRAVENOUS
  Filled 2016-11-17: qty 2

## 2016-11-17 MED ORDER — CHLORHEXIDINE GLUCONATE 0.12% ORAL RINSE (MEDLINE KIT)
15.0000 mL | Freq: Two times a day (BID) | OROMUCOSAL | Status: DC
  Administered 2016-11-17: 15 mL via OROMUCOSAL

## 2016-11-17 MED ORDER — METOPROLOL TARTRATE 5 MG/5ML IV SOLN
2.5000 mg | INTRAVENOUS | Status: DC | PRN
  Administered 2016-11-18 – 2016-11-22 (×4): 5 mg via INTRAVENOUS
  Administered 2016-11-23 – 2016-11-24 (×3): 2.5 mg via INTRAVENOUS
  Filled 2016-11-17 (×8): qty 5

## 2016-11-17 MED ORDER — ACETAMINOPHEN 500 MG PO TABS
1000.0000 mg | ORAL_TABLET | Freq: Four times a day (QID) | ORAL | Status: AC
  Administered 2016-11-18 – 2016-11-22 (×18): 1000 mg via ORAL
  Filled 2016-11-17 (×18): qty 2

## 2016-11-17 MED ORDER — SODIUM CHLORIDE 0.9 % IV SOLN
20.0000 ug | INTRAVENOUS | Status: AC
  Administered 2016-11-17: 20 ug via INTRAVENOUS
  Filled 2016-11-17: qty 5

## 2016-11-17 MED ORDER — ACETAMINOPHEN 160 MG/5ML PO SOLN
1000.0000 mg | Freq: Four times a day (QID) | ORAL | Status: AC
  Administered 2016-11-17: 1000 mg
  Filled 2016-11-17: qty 40.6

## 2016-11-17 MED ORDER — HEMOSTATIC AGENTS (NO CHARGE) OPTIME
TOPICAL | Status: DC | PRN
  Administered 2016-11-17 (×4): 1 via TOPICAL

## 2016-11-17 MED ORDER — MORPHINE SULFATE (PF) 4 MG/ML IV SOLN
2.0000 mg | INTRAVENOUS | Status: DC | PRN
  Administered 2016-11-18 – 2016-11-21 (×6): 4 mg via INTRAVENOUS
  Filled 2016-11-17 (×6): qty 1

## 2016-11-17 MED ORDER — MILRINONE LACTATE IN DEXTROSE 20-5 MG/100ML-% IV SOLN
0.1250 ug/kg/min | INTRAVENOUS | Status: AC
  Administered 2016-11-17: 0.25 ug/kg/min via INTRAVENOUS
  Filled 2016-11-17: qty 100

## 2016-11-17 MED ORDER — PROPOFOL 10 MG/ML IV BOLUS
INTRAVENOUS | Status: AC
  Filled 2016-11-17: qty 20

## 2016-11-17 MED ORDER — SODIUM CHLORIDE 0.9% FLUSH
3.0000 mL | Freq: Two times a day (BID) | INTRAVENOUS | Status: DC
  Administered 2016-11-19 – 2016-11-24 (×10): 3 mL via INTRAVENOUS

## 2016-11-17 MED ORDER — 0.9 % SODIUM CHLORIDE (POUR BTL) OPTIME
TOPICAL | Status: DC | PRN
  Administered 2016-11-17: 6000 mL

## 2016-11-17 MED ORDER — MORPHINE SULFATE (PF) 2 MG/ML IV SOLN: 2.0000 mg | INTRAVENOUS | Status: DC | PRN

## 2016-11-17 MED ORDER — SODIUM CHLORIDE 0.9 % IV SOLN
INTRAVENOUS | Status: DC | PRN
  Administered 2016-11-17: 1 [IU]/h via INTRAVENOUS

## 2016-11-17 MED ORDER — MORPHINE SULFATE (PF) 4 MG/ML IV SOLN
1.0000 mg | INTRAVENOUS | Status: AC | PRN
  Administered 2016-11-17 (×2): 2 mg via INTRAVENOUS
  Administered 2016-11-17: 4 mg via INTRAVENOUS
  Administered 2016-11-18 (×2): 2 mg via INTRAVENOUS
  Filled 2016-11-17 (×3): qty 1

## 2016-11-17 MED ORDER — POTASSIUM CHLORIDE 10 MEQ/50ML IV SOLN
10.0000 meq | INTRAVENOUS | Status: AC
  Administered 2016-11-17 (×3): 10 meq via INTRAVENOUS

## 2016-11-17 MED ORDER — ROCURONIUM BROMIDE 100 MG/10ML IV SOLN
INTRAVENOUS | Status: DC | PRN
  Administered 2016-11-17 (×6): 50 mg via INTRAVENOUS

## 2016-11-17 MED ORDER — BISACODYL 5 MG PO TBEC
10.0000 mg | DELAYED_RELEASE_TABLET | Freq: Every day | ORAL | Status: DC
  Administered 2016-11-18 – 2016-11-25 (×7): 10 mg via ORAL
  Filled 2016-11-17 (×7): qty 2

## 2016-11-17 MED ORDER — VANCOMYCIN HCL IN DEXTROSE 1-5 GM/200ML-% IV SOLN
1000.0000 mg | Freq: Once | INTRAVENOUS | Status: AC
  Administered 2016-11-17: 1000 mg via INTRAVENOUS
  Filled 2016-11-17: qty 200

## 2016-11-17 MED ORDER — MIDAZOLAM HCL 2 MG/2ML IJ SOLN: 2.0000 mg | INTRAMUSCULAR | Status: DC | PRN

## 2016-11-17 MED ORDER — GELATIN ABSORBABLE MT POWD
OROMUCOSAL | Status: DC | PRN
  Administered 2016-11-17 (×3): via TOPICAL

## 2016-11-17 MED ORDER — TRANEXAMIC ACID 1000 MG/10ML IV SOLN
INTRAVENOUS | Status: DC | PRN
  Administered 2016-11-17: 1410 mg via INTRAVENOUS
  Administered 2016-11-17: 1.5 mg/kg/h via INTRAVENOUS

## 2016-11-17 MED ORDER — DEXMEDETOMIDINE HCL IN NACL 400 MCG/100ML IV SOLN
INTRAVENOUS | Status: DC | PRN
  Administered 2016-11-17: .3 ug/kg/h via INTRAVENOUS

## 2016-11-17 MED ORDER — MIDAZOLAM HCL 5 MG/5ML IJ SOLN
INTRAMUSCULAR | Status: DC | PRN
  Administered 2016-11-17: 2 mg via INTRAVENOUS
  Administered 2016-11-17: 1 mg via INTRAVENOUS
  Administered 2016-11-17: 5 mg via INTRAVENOUS
  Administered 2016-11-17 (×2): 2 mg via INTRAVENOUS

## 2016-11-17 MED ORDER — CHLORHEXIDINE GLUCONATE 0.12 % MT SOLN
15.0000 mL | OROMUCOSAL | Status: AC
  Administered 2016-11-17: 15 mL via OROMUCOSAL

## 2016-11-17 MED ORDER — SODIUM CHLORIDE 0.9 % IV SOLN: 250.0000 mL | INTRAVENOUS | Status: DC

## 2016-11-17 MED ORDER — PANTOPRAZOLE SODIUM 40 MG PO TBEC
40.0000 mg | DELAYED_RELEASE_TABLET | Freq: Every day | ORAL | Status: DC
  Administered 2016-11-19 – 2016-11-25 (×7): 40 mg via ORAL
  Filled 2016-11-17 (×7): qty 1

## 2016-11-17 MED ORDER — MILRINONE LACTATE IN DEXTROSE 20-5 MG/100ML-% IV SOLN
0.1250 ug/kg/min | INTRAVENOUS | Status: AC
  Administered 2016-11-17 – 2016-11-20 (×4): 0.25 ug/kg/min via INTRAVENOUS
  Administered 2016-11-21: 0.125 ug/kg/min via INTRAVENOUS
  Filled 2016-11-17 (×7): qty 100

## 2016-11-17 MED ORDER — METOPROLOL TARTRATE 12.5 MG HALF TABLET
12.5000 mg | ORAL_TABLET | Freq: Two times a day (BID) | ORAL | Status: DC
  Administered 2016-11-18 – 2016-11-19 (×3): 12.5 mg via ORAL
  Filled 2016-11-17 (×3): qty 1

## 2016-11-17 MED ORDER — NITROGLYCERIN IN D5W 200-5 MCG/ML-% IV SOLN: 0.0000 ug/min | INTRAVENOUS | Status: DC

## 2016-11-17 MED ORDER — HEPARIN SODIUM (PORCINE) 1000 UNIT/ML IJ SOLN
INTRAMUSCULAR | Status: DC | PRN
  Administered 2016-11-17: 2000 [IU] via INTRAVENOUS
  Administered 2016-11-17: 33000 [IU] via INTRAVENOUS
  Administered 2016-11-17: 2000 [IU] via INTRAVENOUS

## 2016-11-17 MED ORDER — ACETAMINOPHEN 160 MG/5ML PO SOLN: 650.0000 mg | Freq: Once | ORAL | Status: AC

## 2016-11-17 MED ORDER — ASPIRIN EC 325 MG PO TBEC
325.0000 mg | DELAYED_RELEASE_TABLET | Freq: Every day | ORAL | Status: DC
  Administered 2016-11-18 – 2016-11-24 (×7): 325 mg via ORAL
  Filled 2016-11-17 (×7): qty 1

## 2016-11-17 MED ORDER — SODIUM CHLORIDE 0.9 % IV SOLN
0.0000 ug/kg/h | INTRAVENOUS | Status: DC
  Filled 2016-11-17: qty 2

## 2016-11-17 MED ORDER — INSULIN REGULAR BOLUS VIA INFUSION
0.0000 [IU] | Freq: Three times a day (TID) | INTRAVENOUS | Status: DC
  Filled 2016-11-17: qty 10

## 2016-11-17 MED ORDER — METOPROLOL TARTRATE 25 MG/10 ML ORAL SUSPENSION: 12.5000 mg | Freq: Two times a day (BID) | ORAL | Status: DC

## 2016-11-17 MED ORDER — SODIUM BICARBONATE 8.4 % IV SOLN
50.0000 meq | Freq: Once | INTRAVENOUS | Status: AC
  Administered 2016-11-17: 50 meq via INTRAVENOUS

## 2016-11-17 MED ORDER — LACTATED RINGERS IV SOLN: INTRAVENOUS | Status: DC

## 2016-11-17 MED ORDER — PHENYLEPHRINE HCL 10 MG/ML IJ SOLN
INTRAVENOUS | Status: DC | PRN
  Administered 2016-11-17: 35 ug/min via INTRAVENOUS

## 2016-11-17 MED ORDER — PLASMA-LYTE 148 IV SOLN
INTRAVENOUS | Status: DC | PRN
  Administered 2016-11-17 (×2): 500 mL via INTRAVASCULAR

## 2016-11-17 MED ORDER — PHENYLEPHRINE 40 MCG/ML (10ML) SYRINGE FOR IV PUSH (FOR BLOOD PRESSURE SUPPORT)
PREFILLED_SYRINGE | INTRAVENOUS | Status: AC
  Filled 2016-11-17: qty 10

## 2016-11-17 MED ORDER — GLYCOPYRROLATE 0.2 MG/ML IJ SOLN
INTRAMUSCULAR | Status: DC | PRN
  Administered 2016-11-17: 0.1 mg via INTRAVENOUS

## 2016-11-17 MED ORDER — ROCURONIUM BROMIDE 50 MG/5ML IV SOLN
INTRAVENOUS | Status: AC
  Filled 2016-11-17: qty 1

## 2016-11-17 MED ORDER — FENTANYL CITRATE (PF) 100 MCG/2ML IJ SOLN
INTRAMUSCULAR | Status: DC | PRN
  Administered 2016-11-17: 50 ug via INTRAVENOUS
  Administered 2016-11-17: 100 ug via INTRAVENOUS
  Administered 2016-11-17: 150 ug via INTRAVENOUS
  Administered 2016-11-17: 200 ug via INTRAVENOUS
  Administered 2016-11-17 (×4): 100 ug via INTRAVENOUS
  Administered 2016-11-17: 200 ug via INTRAVENOUS
  Administered 2016-11-17: 150 ug via INTRAVENOUS

## 2016-11-17 MED ORDER — ACETAMINOPHEN 650 MG RE SUPP
650.0000 mg | Freq: Once | RECTAL | Status: AC
  Administered 2016-11-17: 650 mg via RECTAL

## 2016-11-17 MED ORDER — LIDOCAINE HCL (CARDIAC) 20 MG/ML IV SOLN
INTRAVENOUS | Status: DC | PRN
  Administered 2016-11-17: 60 mg via INTRAVENOUS

## 2016-11-17 MED ORDER — ASPIRIN 81 MG PO CHEW
324.0000 mg | CHEWABLE_TABLET | Freq: Every day | ORAL | Status: DC
  Filled 2016-11-17 (×2): qty 4

## 2016-11-17 MED ORDER — BISACODYL 10 MG RE SUPP: 10.0000 mg | Freq: Every day | RECTAL | Status: DC

## 2016-11-17 MED ORDER — PROTAMINE SULFATE 10 MG/ML IV SOLN
INTRAVENOUS | Status: DC | PRN
  Administered 2016-11-17: 20 mg via INTRAVENOUS
  Administered 2016-11-17 (×4): 50 mg via INTRAVENOUS
  Administered 2016-11-17: 20 mg via INTRAVENOUS
  Administered 2016-11-17: 30 mg via INTRAVENOUS
  Administered 2016-11-17: 80 mg via INTRAVENOUS

## 2016-11-17 MED ORDER — FAMOTIDINE IN NACL 20-0.9 MG/50ML-% IV SOLN
20.0000 mg | Freq: Two times a day (BID) | INTRAVENOUS | Status: AC
  Administered 2016-11-17 (×2): 20 mg via INTRAVENOUS
  Filled 2016-11-17: qty 50

## 2016-11-17 MED ORDER — LACTATED RINGERS IV SOLN
INTRAVENOUS | Status: DC
Start: 2016-11-17 — End: 2016-11-23

## 2016-11-17 MED ORDER — DEXTROSE 5 % IV SOLN
INTRAVENOUS | Status: DC | PRN
  Administered 2016-11-17: .75 g via INTRAVENOUS
  Administered 2016-11-17: 1.5 g via INTRAVENOUS

## 2016-11-17 MED ORDER — TRAMADOL HCL 50 MG PO TABS
50.0000 mg | ORAL_TABLET | ORAL | Status: DC | PRN
  Administered 2016-11-18 – 2016-11-20 (×4): 100 mg via ORAL
  Administered 2016-11-21 (×2): 50 mg via ORAL
  Administered 2016-11-21 – 2016-11-23 (×5): 100 mg via ORAL
  Filled 2016-11-17 (×3): qty 2
  Filled 2016-11-17 (×2): qty 1
  Filled 2016-11-17 (×7): qty 2

## 2016-11-17 MED ORDER — CALCIUM CHLORIDE 10 % IV SOLN
INTRAVENOUS | Status: DC | PRN
  Administered 2016-11-17: 200 mg via INTRAVENOUS

## 2016-11-17 MED ORDER — DOCUSATE SODIUM 100 MG PO CAPS
200.0000 mg | ORAL_CAPSULE | Freq: Every day | ORAL | Status: DC
  Administered 2016-11-18 – 2016-11-25 (×8): 200 mg via ORAL
  Filled 2016-11-17 (×8): qty 2

## 2016-11-17 MED ORDER — DOPAMINE-DEXTROSE 3.2-5 MG/ML-% IV SOLN
INTRAVENOUS | Status: DC | PRN
  Administered 2016-11-17: 2 ug/kg/min via INTRAVENOUS

## 2016-11-17 MED ORDER — SUCCINYLCHOLINE CHLORIDE 200 MG/10ML IV SOSY
PREFILLED_SYRINGE | INTRAVENOUS | Status: AC
  Filled 2016-11-17: qty 10

## 2016-11-17 MED ORDER — ONDANSETRON HCL 4 MG/2ML IJ SOLN: 4.0000 mg | Freq: Four times a day (QID) | INTRAMUSCULAR | Status: DC | PRN

## 2016-11-17 MED ORDER — ARTIFICIAL TEARS OPHTHALMIC OINT
TOPICAL_OINTMENT | OPHTHALMIC | Status: DC | PRN
  Administered 2016-11-17: 1 via OPHTHALMIC

## 2016-11-17 MED ORDER — SODIUM CHLORIDE 0.9% FLUSH: 3.0000 mL | INTRAVENOUS | Status: DC | PRN

## 2016-11-17 MED ORDER — PHENYLEPHRINE HCL 10 MG/ML IJ SOLN
0.0000 ug/min | INTRAMUSCULAR | Status: DC
  Filled 2016-11-17: qty 1

## 2016-11-17 MED ORDER — PHENYLEPHRINE HCL 10 MG/ML IJ SOLN
INTRAMUSCULAR | Status: DC | PRN
  Administered 2016-11-17: 25 ug/min via INTRAVENOUS

## 2016-11-17 MED ORDER — INSULIN REGULAR HUMAN 100 UNIT/ML IJ SOLN
INTRAMUSCULAR | Status: DC
  Administered 2016-11-17: 5.3 [IU]/h via INTRAVENOUS
  Filled 2016-11-17: qty 1

## 2016-11-17 MED ORDER — DEXTROSE 5 % IV SOLN
1.5000 g | Freq: Two times a day (BID) | INTRAVENOUS | Status: AC
  Administered 2016-11-17 – 2016-11-19 (×4): 1.5 g via INTRAVENOUS
  Filled 2016-11-17 (×4): qty 1.5

## 2016-11-17 MED ORDER — OXYCODONE HCL 5 MG PO TABS
5.0000 mg | ORAL_TABLET | ORAL | Status: DC | PRN
  Administered 2016-11-18 – 2016-11-24 (×27): 10 mg via ORAL
  Administered 2016-11-24: 5 mg via ORAL
  Administered 2016-11-24 – 2016-11-25 (×3): 10 mg via ORAL
  Filled 2016-11-17 (×17): qty 2
  Filled 2016-11-17: qty 1
  Filled 2016-11-17 (×16): qty 2

## 2016-11-17 MED ORDER — MORPHINE SULFATE (PF) 2 MG/ML IV SOLN: 1.0000 mg | INTRAVENOUS | Status: DC | PRN

## 2016-11-17 MED ORDER — DOPAMINE-DEXTROSE 3.2-5 MG/ML-% IV SOLN
2.0000 ug/kg/min | INTRAVENOUS | Status: DC
  Administered 2016-11-17: 2 ug/kg/min via INTRAVENOUS

## 2016-11-17 MED ORDER — ORAL CARE MOUTH RINSE
15.0000 mL | OROMUCOSAL | Status: DC
  Administered 2016-11-17 – 2016-11-18 (×3): 15 mL via OROMUCOSAL

## 2016-11-17 MED ORDER — SODIUM CHLORIDE 0.9 % IV SOLN
INTRAVENOUS | Status: DC | PRN
  Administered 2016-11-17: 1500 mg via INTRAVENOUS

## 2016-11-17 MED ORDER — PROPOFOL 10 MG/ML IV BOLUS
INTRAVENOUS | Status: DC | PRN
  Administered 2016-11-17: 70 mg via INTRAVENOUS

## 2016-11-17 MED ORDER — SODIUM CHLORIDE 0.9 % IV SOLN
INTRAVENOUS | Status: DC
  Administered 2016-11-17: 16:00:00 via INTRAVENOUS

## 2016-11-17 MED ORDER — METOCLOPRAMIDE HCL 5 MG/ML IJ SOLN
10.0000 mg | Freq: Four times a day (QID) | INTRAMUSCULAR | Status: AC
  Administered 2016-11-17 – 2016-11-22 (×18): 10 mg via INTRAVENOUS
  Filled 2016-11-17 (×18): qty 2

## 2016-11-17 MED ORDER — ALBUMIN HUMAN 5 % IV SOLN
INTRAVENOUS | Status: DC | PRN
  Administered 2016-11-17 (×2): via INTRAVENOUS

## 2016-11-17 MED ORDER — ALBUMIN HUMAN 5 % IV SOLN: 250.0000 mL | INTRAVENOUS | Status: AC | PRN

## 2016-11-17 MED ORDER — PLASMA-LYTE 148 IV SOLN
INTRAVENOUS | Status: DC
  Filled 2016-11-17: qty 2.5

## 2016-11-17 MED ORDER — MIDAZOLAM HCL 10 MG/2ML IJ SOLN
INTRAMUSCULAR | Status: AC
  Filled 2016-11-17: qty 2

## 2016-11-17 MED ORDER — SODIUM CHLORIDE 0.45 % IV SOLN
INTRAVENOUS | Status: DC | PRN
  Administered 2016-11-17: 16:00:00 via INTRAVENOUS

## 2016-11-17 MED FILL — Electrolyte-R (PH 7.4) Solution: INTRAVENOUS | Qty: 5000 | Status: AC

## 2016-11-17 MED FILL — Sodium Chloride IV Soln 0.9%: INTRAVENOUS | Qty: 2000 | Status: AC

## 2016-11-17 MED FILL — Magnesium Sulfate Inj 50%: INTRAMUSCULAR | Qty: 2 | Status: AC

## 2016-11-17 MED FILL — Sodium Bicarbonate IV Soln 8.4%: INTRAVENOUS | Qty: 50 | Status: AC

## 2016-11-17 MED FILL — Mannitol IV Soln 20%: INTRAVENOUS | Qty: 500 | Status: AC

## 2016-11-17 MED FILL — Heparin Sodium (Porcine) Inj 1000 Unit/ML: INTRAMUSCULAR | Qty: 30 | Status: AC

## 2016-11-17 MED FILL — Heparin Sodium (Porcine) Inj 1000 Unit/ML: INTRAMUSCULAR | Qty: 20 | Status: AC

## 2016-11-17 MED FILL — Lidocaine HCl IV Inj 20 MG/ML: INTRAVENOUS | Qty: 5 | Status: AC

## 2016-11-17 MED FILL — Potassium Chloride Inj 2 mEq/ML: INTRAVENOUS | Qty: 10 | Status: AC

## 2016-11-17 SURGICAL SUPPLY — 106 items
ADAPTER CARDIO PERF ANTE/RETRO (ADAPTER) ×4 IMPLANT
ADPR PRFSN 84XANTGRD RTRGD (ADAPTER) ×2
AGENT HMST KT MTR STRL THRMB (HEMOSTASIS) ×2
BAG DECANTER FOR FLEXI CONT (MISCELLANEOUS) ×7 IMPLANT
BANDAGE ACE 4X5 VEL STRL LF (GAUZE/BANDAGES/DRESSINGS) ×4 IMPLANT
BANDAGE ACE 6X5 VEL STRL LF (GAUZE/BANDAGES/DRESSINGS) ×4 IMPLANT
BASKET HEART  (ORDER IN 25'S) (MISCELLANEOUS) ×1
BASKET HEART (ORDER IN 25'S) (MISCELLANEOUS) ×1
BASKET HEART (ORDER IN 25S) (MISCELLANEOUS) ×2 IMPLANT
BLADE CLIPPER SURG (BLADE) ×2 IMPLANT
BLADE MINI RND TIP GREEN BEAV (BLADE) ×3 IMPLANT
BLADE STERNUM SYSTEM 6 (BLADE) ×4 IMPLANT
BLADE SURG 12 STRL SS (BLADE) ×4 IMPLANT
BNDG GAUZE ELAST 4 BULKY (GAUZE/BANDAGES/DRESSINGS) ×4 IMPLANT
CANISTER SUCT 3000ML PPV (MISCELLANEOUS) ×4 IMPLANT
CANNULA GUNDRY RCSP 15FR (MISCELLANEOUS) ×4 IMPLANT
CATH CPB KIT VANTRIGT (MISCELLANEOUS) ×4 IMPLANT
CATH ROBINSON RED A/P 18FR (CATHETERS) ×12 IMPLANT
CATH THORACIC 36FR RT ANG (CATHETERS) ×4 IMPLANT
CLIP FOGARTY SPRING 6M (CLIP) ×2 IMPLANT
CLIP TI WIDE RED SMALL 24 (CLIP) ×6 IMPLANT
CRADLE DONUT ADULT HEAD (MISCELLANEOUS) ×4 IMPLANT
DRAIN CHANNEL 32F RND 10.7 FF (WOUND CARE) ×4 IMPLANT
DRAPE CARDIOVASCULAR INCISE (DRAPES) ×4
DRAPE SLUSH/WARMER DISC (DRAPES) ×4 IMPLANT
DRAPE SRG 135X102X78XABS (DRAPES) ×2 IMPLANT
DRSG AQUACEL AG ADV 3.5X14 (GAUZE/BANDAGES/DRESSINGS) ×4 IMPLANT
ELECT BLADE 4.0 EZ CLEAN MEGAD (MISCELLANEOUS) ×4
ELECT BLADE 6.5 EXT (BLADE) ×4 IMPLANT
ELECT CAUTERY BLADE 6.4 (BLADE) ×4 IMPLANT
ELECT REM PT RETURN 9FT ADLT (ELECTROSURGICAL) ×8
ELECTRODE BLDE 4.0 EZ CLN MEGD (MISCELLANEOUS) ×2 IMPLANT
ELECTRODE REM PT RTRN 9FT ADLT (ELECTROSURGICAL) ×4 IMPLANT
FELT TEFLON 1X6 (MISCELLANEOUS) ×6 IMPLANT
GAUZE SPONGE 4X4 12PLY STRL (GAUZE/BANDAGES/DRESSINGS) ×8 IMPLANT
GLOVE BIO SURGEON STRL SZ 6 (GLOVE) ×2 IMPLANT
GLOVE BIO SURGEON STRL SZ 6.5 (GLOVE) ×2 IMPLANT
GLOVE BIO SURGEON STRL SZ7 (GLOVE) ×18 IMPLANT
GLOVE BIO SURGEON STRL SZ7.5 (GLOVE) ×14 IMPLANT
GLOVE BIO SURGEONS STRL SZ 6.5 (GLOVE) ×2
GLOVE BIOGEL PI IND STRL 6 (GLOVE) IMPLANT
GLOVE BIOGEL PI INDICATOR 6 (GLOVE) ×6
GOWN STRL REUS W/ TWL LRG LVL3 (GOWN DISPOSABLE) ×8 IMPLANT
GOWN STRL REUS W/TWL LRG LVL3 (GOWN DISPOSABLE) ×40
HEMOSTAT POWDER SURGIFOAM 1G (HEMOSTASIS) ×12 IMPLANT
HEMOSTAT SURGICEL 2X14 (HEMOSTASIS) ×7 IMPLANT
KIT BASIN OR (CUSTOM PROCEDURE TRAY) ×4 IMPLANT
KIT ROOM TURNOVER OR (KITS) ×4 IMPLANT
KIT SUCTION CATH 14FR (SUCTIONS) ×4 IMPLANT
KIT VASOVIEW HEMOPRO VH 3000 (KITS) ×4 IMPLANT
LEAD PACING MYOCARDI (MISCELLANEOUS) ×4 IMPLANT
MARKER GRAFT CORONARY BYPASS (MISCELLANEOUS) ×12 IMPLANT
NS IRRIG 1000ML POUR BTL (IV SOLUTION) ×22 IMPLANT
PACK OPEN HEART (CUSTOM PROCEDURE TRAY) ×4 IMPLANT
PAD ARMBOARD 7.5X6 YLW CONV (MISCELLANEOUS) ×8 IMPLANT
PAD ELECT DEFIB RADIOL ZOLL (MISCELLANEOUS) ×4 IMPLANT
PENCIL BUTTON HOLSTER BLD 10FT (ELECTRODE) ×4 IMPLANT
POWDER SURGICEL 3.0 GRAM (HEMOSTASIS) ×2 IMPLANT
PUNCH AORTIC ROTATE  4.5MM 8IN (MISCELLANEOUS) ×2 IMPLANT
SET CARDIOPLEGIA MPS 5001102 (MISCELLANEOUS) ×2 IMPLANT
SPONGE LAP 18X18 X RAY DECT (DISPOSABLE) ×2 IMPLANT
SPONGE LAP 4X18 X RAY DECT (DISPOSABLE) ×2 IMPLANT
STOPCOCK 4 WAY LG BORE MALE ST (IV SETS) ×2 IMPLANT
SURGIFLO W/THROMBIN 8M KIT (HEMOSTASIS) ×6 IMPLANT
SUT BONE WAX W31G (SUTURE) ×4 IMPLANT
SUT ETHIBOND 2 0 SH (SUTURE) ×16
SUT ETHIBOND 2 0 SH 36X2 (SUTURE) IMPLANT
SUT MNCRL AB 4-0 PS2 18 (SUTURE) ×2 IMPLANT
SUT PROLENE 3 0 SH DA (SUTURE) ×3 IMPLANT
SUT PROLENE 4 0 RB 1 (SUTURE) ×8
SUT PROLENE 4 0 SH DA (SUTURE) ×4 IMPLANT
SUT PROLENE 4-0 RB1 .5 CRCL 36 (SUTURE) ×2 IMPLANT
SUT PROLENE 5 0 C 1 36 (SUTURE) ×6 IMPLANT
SUT PROLENE 6 0 C 1 30 (SUTURE) ×8 IMPLANT
SUT PROLENE 6 0 CC (SUTURE) ×24 IMPLANT
SUT PROLENE 8 0 BV175 6 (SUTURE) ×12 IMPLANT
SUT PROLENE BLUE 7 0 (SUTURE) ×8 IMPLANT
SUT PROLENE POLY MONO (SUTURE) ×4 IMPLANT
SUT SILK  1 MH (SUTURE) ×6
SUT SILK 1 MH (SUTURE) ×3 IMPLANT
SUT SILK 1 TIES 10X30 (SUTURE) ×3 IMPLANT
SUT SILK 2 0 SH CR/8 (SUTURE) ×6 IMPLANT
SUT SILK 2 0 TIES 10X30 (SUTURE) ×2 IMPLANT
SUT SILK 2 0 TIES 17X18 (SUTURE) ×4
SUT SILK 2-0 18XBRD TIE BLK (SUTURE) IMPLANT
SUT SILK 3 0 SH CR/8 (SUTURE) ×4 IMPLANT
SUT SILK 4 0 TIE 10X30 (SUTURE) ×4 IMPLANT
SUT STEEL 6MS V (SUTURE) ×8 IMPLANT
SUT STEEL SZ 6 DBL 3X14 BALL (SUTURE) ×13 IMPLANT
SUT TEM PAC WIRE 2 0 SH (SUTURE) ×12 IMPLANT
SUT VIC AB 1 CTX 36 (SUTURE) ×16
SUT VIC AB 1 CTX36XBRD ANBCTR (SUTURE) ×6 IMPLANT
SUT VIC AB 2-0 CT1 27 (SUTURE) ×4
SUT VIC AB 2-0 CT1 TAPERPNT 27 (SUTURE) IMPLANT
SUT VIC AB 2-0 CTX 27 (SUTURE) ×6 IMPLANT
SUT VIC AB 3-0 X1 27 (SUTURE) ×4 IMPLANT
SYSTEM SAHARA CHEST DRAIN ATS (WOUND CARE) ×4 IMPLANT
TAPE CLOTH SURG 4X10 WHT LF (GAUZE/BANDAGES/DRESSINGS) ×2 IMPLANT
TAPE PAPER 2X10 WHT MICROPORE (GAUZE/BANDAGES/DRESSINGS) ×2 IMPLANT
TOWEL GREEN STERILE (TOWEL DISPOSABLE) ×10 IMPLANT
TRAY CATH LUMEN 1 20CM STRL (SET/KITS/TRAYS/PACK) ×2 IMPLANT
TRAY FOLEY SILVER 16FR TEMP (SET/KITS/TRAYS/PACK) ×4 IMPLANT
TUBING ART PRESS 48 MALE/FEM (TUBING) ×6 IMPLANT
TUBING INSUFFLATION (TUBING) ×4 IMPLANT
UNDERPAD 30X30 (UNDERPADS AND DIAPERS) ×4 IMPLANT
WATER STERILE IRR 1000ML POUR (IV SOLUTION) ×8 IMPLANT

## 2016-11-17 NOTE — Progress Notes (Signed)
  Echocardiogram Echocardiogram Transesophageal has been performed.  Janalyn HarderWest, Zaccheaus Storlie R 11/17/2016, 8:55 AM

## 2016-11-17 NOTE — Transfer of Care (Signed)
Immediate Anesthesia Transfer of Care Note  Patient: [name] [name]  Procedure(s) Performed: Procedure(s): CORONARY ARTERY BYPASS GRAFTING (CABG) x 5, USING RIGHT INTERNAL MAMMARY ARTERY AND RIGHT GREATER SAPHENOUS VEIN HARVESTED ENDOSCOPICALLY (N/A) TRANSESOPHAGEAL ECHOCARDIOGRAM (TEE) (N/A)  Patient Location: SICU  Anesthesia Type:General  Level of Consciousness: sedated and Patient remains intubated per anesthesia plan  Airway & Oxygen Therapy: Patient remains intubated per anesthesia plan and Patient placed on Ventilator (see vital sign flow sheet for setting)  Post-op Assessment: Report given to RN and Post -op Vital signs reviewed and stable  Post vital signs: Reviewed and stable  Last Vitals:  Vitals:   11/17/16 0538 11/17/16 1437  BP: 109/77 94/66  Pulse: 68   Resp: 16   Temp: 36.6 C     Last Pain:  Vitals:   11/17/16 0538  TempSrc: Oral  PainSc:       Patients Stated Pain Goal: 0 (11/16/16 1944)  Complications: No apparent anesthesia complications

## 2016-11-17 NOTE — Addendum Note (Signed)
Addendum  created 11/17/16 1813 by Izola Priceockfield, Wardell Pokorski Walton Jr., CRNA   Anesthesia Intra Meds edited

## 2016-11-17 NOTE — Progress Notes (Signed)
The patient was examined and preop studies reviewed. There has been no change from the prior exam and the patient is ready for surgery. Plan CABG on T [place]

## 2016-11-17 NOTE — Progress Notes (Signed)
[facility] BRIEF SICU PROGRESS NOTE  Day of Surgery  S/P Procedure(s) (LRB): CORONARY ARTERY BYPASS GRAFTING (CABG) x 5, USING RIGHT INTERNAL MAMMARY ARTERY AND RIGHT GREATER SAPHENOUS VEIN HARVESTED ENDOSCOPICALLY (N/A) TRANSESOPHAGEAL ECHOCARDIOGRAM (TEE) (N/A)   Waking up on vent NSR w/ stable hemodynamics on Neo, Dopamine and milrinone drips O2 sats 100% on 50% FiO2 Chest tube output low UOP adequate Labs okay  Plan: Continue routine early postop  Purcell Nailslarence H Zakir Henner, MD 11/17/2016 7:25 PM

## 2016-11-17 NOTE — Anesthesia Preprocedure Evaluation (Addendum)
Anesthesia Evaluation  Patient identified by MRN, date of birth, ID band Patient awake    Reviewed: Allergy & Precautions, NPO status , Patient's Chart, lab work & pertinent test results  Airway Mallampati: II  TM Distance: >3 FB Neck ROM: Full    Dental no notable dental hx. (+) Poor Dentition, Dental Advisory Given, Chipped, Missing,    Pulmonary sleep apnea ,    Pulmonary exam normal breath sounds clear to auscultation       Cardiovascular hypertension, + CAD and + Past MI  Normal cardiovascular exam Rhythm:Regular Rate:Normal  [name] [name]  ECHO COMPLETE WO IMAGE ENHANCING AGENT  Order# [id number]  Reading physician: Vesta Mixer, MD Ordering physician: Haydee Salter, MD Study date: 11/12/16 Study Result   Result status: Final result                             *[place]*                   *[facility]*                         [address]                        [place], [place] [address]                            [phone]  ------------------------------------------------------------------- Transthoracic Echocardiography  Patient:    [name] [name] MR #:       [id number] Study Date: 11/12/2016 Gender:     M Age:        55 Height:     180.3 cm Weight:     92.9 kg BSA:        2.18 m^2 Pt. Status: Room:       3W34C   SONOGRAPHER  Arvil Chaco  PERFORMING   [facility], Inpatient  ADMITTING    Haydee Salter  ATTENDING    Gertha Calkin  REFERRING    Eliezer Bottom M  cc:  ------------------------------------------------------------------- LV EF: 60% -   65%  ------------------------------------------------------------------- Indications:      Chest pain 786.51.  ------------------------------------------------------------------- History:   Risk factors:  Diabetes mellitus.  Dyslipidemia.  ------------------------------------------------------------------- Study Conclusions  - Left ventricle: The cavity size was normal. Wall thickness was   increased in a pattern of mild LVH. Systolic function was normal.   The estimated ejection fraction was in the range of 60% to 65%.   Wall motion was normal; there were no regional wall motion   abnormalities. Features are consistent with a pseudonormal left   ventricular filling pattern, with concomitant abnormal relaxation   and increased filling pressure (grade 2 diastolic dysfunction).  ------------------------------------------------------------------- Study data:  No prior study was available for comparison.  Study status:  Routine.  Procedure:  The patient reported no pain pre or post test. Transthoracic echocardiography. Image quality was adequate.  Study completion:  There were no complications. Transthoracic echocardiography.  M-mode, complete 2D, spectral Doppler, and color Doppler.  Birthdate:  Patient birthdate: 1960-09-25.  Age:  Patient is 56 yr old.  Sex:  Gender: male. BMI: 28.6 kg/m^2.  Blood pressure:     133/75  Patient status: Inpatient.  Study date:  Study date:  11/12/2016. Study time: 01:45 PM.  Location:  Bedside.  -------------------------------------------------------------------  ------------------------------------------------------------------- Left ventricle:  The cavity size was normal. Wall thickness was increased in a pattern of mild LVH. Systolic function was normal. The estimated ejection fraction was in the range of 60% to 65%. Wall motion was normal; there were no regional wall motion abnormalities. Features are consistent with a pseudonormal left ventricular filling pattern, with concomitant abnormal relaxation and increased filling pressure (grade 2 diastolic dysfunction).  ------------------------------------------------------------------- Aortic valve:   Structurally  normal valve.   Cusp separation was normal.  Doppler:  Transvalvular velocity was within the normal range. There was no stenosis. There was no regurgitation.  ------------------------------------------------------------------- Aorta:  Aortic root: The aortic root was normal in size. Ascending aorta: The ascending aorta was normal in size.  ------------------------------------------------------------------- Mitral valve:   Structurally normal valve.   Leaflet separation was normal.  Doppler:  Transvalvular velocity was within the normal range. There was no evidence for stenosis. There was no regurgitation.    Peak gradient ([name]): 2 mm Hg.  ------------------------------------------------------------------- Left atrium:  The atrium was normal in size.  ------------------------------------------------------------------- Right ventricle:  The cavity size was normal. Systolic function was normal.  ------------------------------------------------------------------- Pulmonic valve:    The valve appears to be grossly normal. Doppler:  There was no significant regurgitation.  ------------------------------------------------------------------- Tricuspid valve:   The valve appears to be grossly normal. Doppler:  There was trivial regurgitation.  ------------------------------------------------------------------- Right atrium:  The atrium was normal in size.  ------------------------------------------------------------------- Pericardium:  There was no pericardial effusion.  ------------------------------------------------------------------- Measurements   Left ventricle                            Value        Reference  LV ID, ED, PLAX chordal                   44.9  mm     43 - 52  LV ID, ES, PLAX chordal                   36.2  mm     23 - 38  LV fx shortening, PLAX chordal  (L)       19    %      >=29  LV PW thickness, ED                       11.3  mm     ----------   IVS/LV PW ratio, ED                       1.02         <=1.3  Stroke volume, 2D                         64    ml     ----------  Stroke volume/bsa, 2D                     29    ml/m^2 ----------  LV e&', lateral                            9.17  cm/s   ----------  LV E/e&', lateral  8.57         ----------  LV e&', medial                             4.05  cm/s   ----------  LV E/e&', medial                           19.41        ----------  LV e&', average                            6.61  cm/s   ----------  LV E/e&', average                          11.89        ----------    Ventricular septum                        Value        Reference  IVS thickness, ED                         11.5  mm     ----------    LVOT                                      Value        Reference  LVOT ID, S                                21    mm     ----------  LVOT area                                 3.46  cm^2   ----------  LVOT peak velocity, S                     107   cm/s   ----------  LVOT mean velocity, S                     68.7  cm/s   ----------  LVOT VTI, S                               18.5  cm     ----------    Aorta                                     Value        Reference  Aortic root ID, ED                        35    mm     ----------    Left atrium                               Value  Reference  LA ID, A-P, ES                            48    mm     ----------  LA ID/bsa, A-P                  (H)       2.21  cm/m^2 <=2.2  LA volume, ES, 1-p A2C                    63.6  ml     ----------  LA volume/bsa, ES, 1-p A2C                29.2  ml/m^2 ----------    Mitral valve                              Value        Reference  Mitral E-wave peak velocity               78.6  cm/s   ----------  Mitral A-wave peak velocity               91.2  cm/s   ----------  Mitral deceleration time                  173   ms     150 - 230  Mitral peak gradient, [name]                    2     mm Hg  ----------  Mitral E/A ratio, peak                    0.9          ----------    Right atrium                              Value        Reference  RA ID, S-I, ES, A4C             (H)       52.1  mm     34 - 49  RA area, ES, A4C                          17    cm^2   8.3 - 19.5  RA volume, ES, A/L                        42.8  ml     ----------  RA volume/bsa, ES, A/L                    19.7  ml/m^2 ----------    Right ventricle                           Value        Reference  RV ID, minor axis, ED, A4C base           34.4  mm     ----------  TAPSE  20.9  mm     ----------  Legend: (L)  and  (H)  mark values outside specified reference range.  ------------------------------------------------------------------- Prepared and Electronically Authenticated by  Kristeen MissPhilip Nahser, M.[name]. 2018-07-05T15:17:57 PACS Images   Show images for ECHOCARDIOGRAM COMPLETE Patient Information   Patient Name [name] [name] [name] [name] Sex Male DOB 11/20/1960 SSN [id number] Reason For Exam  Priority: Routine  Not on file Surgical History   Surgical History    Procedure Laterality Date Comment Source CARDIAC CATHETERIZATION  11/12/2016  Provider CORONARY ANGIOPLASTY WITH STENT PLACEMENT  03/2004  x 3 stents Provider  Other Surgical History    Procedure Laterality Date Comment Source INGUINAL HERNIA REPAIR Right 2000  Provider LEFT HEART CATH AND CORONARY ANGIOGRAPHY N/A 11/12/2016 Procedure: Left Heart Cath and Coronary Angiography; Surgeon: Runell GessBerry, Jonathan J, MD; [facility]; Service: Cardiovascular; Laterality: N/A; Provider  Patient Data   Height  71 in  BP  133/75 mmHg    Performing Technologist/Nurse   Performing Technologist/Nurse: Arvil ChacoFoster, Rachel          Implants     No active implants to display in this view. Order-Level Documents - 11/12/2016:   Scan on 11/13/2016 12:18 PM by Default, Provider, MDScan on  11/13/2016 12:18 PM by Default, Provider, MD  Scan on 11/13/2016 9:28 PM by Waymon BudgeYoung, Clinton [name], MDScan on 11/13/2016 9:28 PM by Waymon BudgeYoung, Clinton D, MD    Encounter-Level Documents - 11/12/2016:   Document on 11/16/2016 10:15 AM by Conley SimmondsGlosson, Jessica P, NT : ED PB Summary  Document on 11/16/2016 10:15 AM by Conley SimmondsGlosson, Jessica P, NT : ED Encounter Summary  Scan on 11/12/2016 4:41 PM by Default, Provider, MDScan on 11/12/2016 4:41 PM by Default, Provider, MD  Scan on 11/14/2016 8:48 AM by Default, Provider, MDScan on 11/14/2016 8:48 AM by Default, Provider, MD  Scan on 11/16/2016 9:06 PM by Default, Provider, MDScan on 11/16/2016 9:06 PM by Default, Provider, MD  Electronic signature on 11/12/2016 7:22 AM  Electronic signature on 11/12/2016 7:21 AM    Signed   Electronically signed by Vesta MixerNahser, Philip J, MD on 11/12/16 at 1518 EDT Printable Result Report    Result Report  External Result Report    External Result Report      Neuro/Psych negative neurological ROS  negative psych ROS   GI/Hepatic Neg liver ROS, GERD  ,  Endo/Other  diabetes  Renal/GU negative Renal ROS  negative genitourinary   Musculoskeletal negative musculoskeletal ROS (+)   Abdominal   Peds negative pediatric ROS (+)  Hematology negative hematology ROS (+)   Anesthesia Other Findings   Reproductive/Obstetrics negative OB ROS                           Anesthesia Physical Anesthesia Plan  ASA: IV  Anesthesia Plan: General   Post-op Pain Management:    Induction: Intravenous  PONV Risk Score and Plan: 1 and Ondansetron and Dexamethasone  Airway Management Planned: Oral ETT  Additional Equipment: Arterial line, PA Cath, TEE and Ultrasound Guidance Line Placement  Intra-op Plan:   Post-operative Plan: Post-operative intubation/ventilation  Informed Consent: I have reviewed the patients History and Physical, chart, labs and discussed the procedure including the risks, benefits and alternatives  for the proposed anesthesia with the patient or authorized representative who has indicated his/her understanding and acceptance.   Dental advisory given  Plan Discussed with: CRNA and Surgeon  Anesthesia Plan Comments:  Anesthesia Quick Evaluation  

## 2016-11-17 NOTE — Anesthesia Procedure Notes (Addendum)
Arterial Line Insertion Start/End7/02/2017 7:04 AM Performed by: Dorie RankQUINN, [name] Decaire M, CRNA  Patient location: Pre-op. Preanesthetic checklist: patient identified, IV checked, risks and benefits discussed, surgical consent, monitors and equipment checked, pre-op evaluation and timeout performed Lidocaine 1% used for infiltration Left, radial was placed Catheter size: 20 G Hand hygiene performed , maximum sterile barriers used  and Seldinger technique used Allen's test indicative of satisfactory collateral circulation Attempts: 1 Procedure performed without using ultrasound guided technique. Following insertion, Biopatch and dressing applied. Post procedure assessment: normal  Patient tolerated the procedure well with no immediate complications.

## 2016-11-17 NOTE — Progress Notes (Signed)
Pt is on CPAP/PSV at this time, tolerating it well.

## 2016-11-17 NOTE — Progress Notes (Signed)
Pt is on CPAP/PSV at this time tolerating it well. RR and MV is stable at this time. No distress or complications noted. SATs are stable. RRT at bedside and will continue to monitor.

## 2016-11-17 NOTE — Anesthesia Postprocedure Evaluation (Signed)
Anesthesia Post Note  Patient: Kathleen Arguehomas D Vastine  Procedure(s) Performed: Procedure(s) (LRB): CORONARY ARTERY BYPASS GRAFTING (CABG) x 5, USING RIGHT INTERNAL MAMMARY ARTERY AND RIGHT GREATER SAPHENOUS VEIN HARVESTED ENDOSCOPICALLY (N/A) TRANSESOPHAGEAL ECHOCARDIOGRAM (TEE) (N/A)     Patient location during evaluation: SICU Anesthesia Type: General Level of consciousness: sedated Pain management: pain level controlled Vital Signs Assessment: post-procedure vital signs reviewed and stable Respiratory status: patient remains intubated per anesthesia plan Cardiovascular status: stable Anesthetic complications: no    Last Vitals:  Vitals:   11/17/16 0538 11/17/16 1530  BP: 109/77 118/84  Pulse: 68   Resp: 16   Temp: 36.6 C     Last Pain:  Vitals:   11/17/16 0538  TempSrc: Oral  PainSc:                  Shailen Thielen S

## 2016-11-17 NOTE — Progress Notes (Signed)
Pt did pulmonary mechanics with good effort.   NIF:  >-60 VC:  0.685ml

## 2016-11-17 NOTE — Anesthesia Procedure Notes (Addendum)
Procedure Name: Intubation Date/Time: 11/10/2016 7:42 AM Performed by: Lavell Luster Pre-anesthesia Checklist: Patient identified, Emergency Drugs available, Suction available, Patient being monitored and Timeout performed Patient Re-evaluated:Patient Re-evaluated prior to inductionOxygen Delivery Method: Circle system utilized Preoxygenation: Pre-oxygenation with 100% oxygen Intubation Type: IV induction Ventilation: Mask ventilation without difficulty Laryngoscope Size: Mac and 4 Grade View: Grade I Tube type: Subglottic suction tube Tube size: 8.0 mm Number of attempts: 1 Airway Equipment and Method: Stylet Placement Confirmation: ETT inserted through vocal cords under direct vision,  positive ETCO2 and breath sounds checked- equal and bilateral Secured at: 22 cm Tube secured with: Tape Dental Injury: Teeth and Oropharynx as per pre-operative assessment

## 2016-11-17 NOTE — Progress Notes (Addendum)
Attempted to wean pt from ventilator, MD called with results of ABG and Bmet. Verbal orders received to give one amp of bicard, place pt back on full support, recheck ABG and attempt to wean again in one hour.   K on istat 2.8, bmet sent to lab to verify and resulted a K of 4.8. MD aware.

## 2016-11-17 NOTE — Procedures (Deleted)
Pt transported from OR to 2H08 on vent, no complications.

## 2016-11-17 NOTE — Progress Notes (Signed)
Repeating the weaning process per MD verbal orders. ABG results are stable. Pt is in no distress at this time. RN aware

## 2016-11-17 NOTE — Progress Notes (Signed)
Per MD Cornelius Moraswen,  Wants patient back on FS at this time. Per ABG results.   Pt does have an audible cuff leak.

## 2016-11-17 NOTE — Brief Op Note (Signed)
11/12/2016 - 11/17/2016  12:24 PM  PATIENT:  Arturo Mortonhomas D Armentor  56 y.o. male  PRE-OPERATIVE DIAGNOSIS:  CAD  POST-OPERATIVE DIAGNOSIS:  CAD  PROCEDURE: TRANSESOPHAGEAL ECHOCARDIOGRAM (TEE), MEDIAN STERNOTOMY for CORONARY ARTERY BYPASS GRAFTING (CABG) x 4, (LIMA to LAD, SVG to RAMUS INTERMEDIATE, SVG to OM, SVG SEQUENTIALLY to PDA and PLB) USING RIGHT INTERNAL MAMMARY ARTERY AND RIGHT GREATER SAPHENOUS VEIN HARVESTED ENDOSCOPICALLY   SURGEON:  Surgeon(s) and Role:    Kerin Perna* Van Trigt, Peter, MD - Primary  PHYSICIAN ASSISTANT: Doree Fudgeonielle Rickie Gange PA-C  ASSISTANTS: Tanda Rockerserry Wagoner RNFA  ANESTHESIA:   general  EBL:  Total I/O In: -  Out: 1700 [Urine:1700]  BLOOD ADMINISTERED:Two FFP  DRAINS: Chest tubes placed in the mediastinal and pleural spaces   COUNTS CORRECT:  YES  DICTATION: .Dragon Dictation  PLAN OF CARE: Admit to inpatient   PATIENT DISPOSITION:  ICU - intubated and hemodynamically stable.   Delay start of Pharmacological VTE agent (>24hrs) due to surgical blood loss or risk of bleeding: yes  BASELINE WEIGHT: 94.1 kg

## 2016-11-17 NOTE — Anesthesia Procedure Notes (Addendum)
Central Venous Catheter Insertion Performed by: Roderic Palau, anesthesiologist Start/End7/02/2017 6:40 AM, 11/17/2016 6:50 AM Patient location: Pre-op. Preanesthetic checklist: patient identified, IV checked, site marked, risks and benefits discussed, surgical consent, monitors and equipment checked, pre-op evaluation, timeout performed and anesthesia consent Position: Trendelenburg Lidocaine 1% used for infiltration and patient sedated Hand hygiene performed , maximum sterile barriers used  and Seldinger technique used Catheter size: 9 Fr Total catheter length 10. Central line and PA cath was placed.MAC introducer Swan type:thermodilution PA Cath depth:50 Procedure performed using ultrasound guided technique. Ultrasound Notes:anatomy identified, needle tip was noted to be adjacent to the nerve/plexus identified, no ultrasound evidence of intravascular and/or intraneural injection and image(s) printed for medical record Attempts: 1 Following insertion, line sutured and dressing applied. Post procedure assessment: blood return through all ports, free fluid flow and no air  Patient tolerated the procedure well with no immediate complications.

## 2016-11-17 NOTE — Anesthesia Postprocedure Evaluation (Signed)
Anesthesia Post Note  Patient: [name] [name]  Procedure(s) Performed: Procedure(s) (LRB): CORONARY ARTERY BYPASS GRAFTING (CABG) x 5, USING RIGHT INTERNAL MAMMARY ARTERY AND RIGHT GREATER SAPHENOUS VEIN HARVESTED ENDOSCOPICALLY (N/A) TRANSESOPHAGEAL ECHOCARDIOGRAM (TEE) (N/A)     Anesthesia Post Evaluation  Last Vitals:  Vitals:   11/17/16 0538 11/17/16 1530  BP: 109/77 118/84  Pulse: 68   Resp: 16   Temp: 36.6 C     Last Pain:  Vitals:   11/17/16 0538  TempSrc: Oral  PainSc:                  [name]

## 2016-11-17 NOTE — Progress Notes (Signed)
initiating the weaning process at this time. Pt is alert and following commands. RN/RRT at bedside.

## 2016-11-17 NOTE — Plan of Care (Signed)
Problem: Education: Goal: Knowledge of [facility] Education information/materials will improve Outcome: Progressing Patient aware of plan of care.  RN provided medication education on all medications administered prior to administration.  Patient stated understanding.  Patient has denied pain to this RN.  RN instructed patient to notify RN immediately if he started to experience any pain.  Patient agreeable to RN instruction.

## 2016-11-18 ENCOUNTER — Encounter (HOSPITAL_COMMUNITY): Payer: Self-pay | Admitting: Cardiothoracic Surgery

## 2016-11-18 ENCOUNTER — Inpatient Hospital Stay (HOSPITAL_COMMUNITY): Payer: Medicare Other

## 2016-11-18 LAB — POCT I-STAT 3, ART BLOOD GAS (G3+)
Acid-base deficit: 1 mmol/L (ref 0.0–2.0)
Acid-base deficit: 2 mmol/L (ref 0.0–2.0)
Acid-base deficit: 3 mmol/L — ABNORMAL HIGH (ref 0.0–2.0)
Bicarbonate: 21.2 mmol/L (ref 20.0–28.0)
Bicarbonate: 22.2 mmol/L (ref 20.0–28.0)
Bicarbonate: 22.3 mmol/L (ref 20.0–28.0)
O2 Saturation: 87 %
O2 Saturation: 93 %
O2 Saturation: 95 %
Patient temperature: 37.8
Patient temperature: 38.1
Patient temperature: 38.4
TCO2: 22 mmol/L (ref 0–100)
TCO2: 23 mmol/L (ref 0–100)
TCO2: 23 mmol/L (ref 0–100)
pCO2 arterial: 31.1 mmHg — ABNORMAL LOW (ref 32.0–48.0)
pCO2 arterial: 36.7 mmHg (ref 32.0–48.0)
pCO2 arterial: 38.4 mmHg (ref 32.0–48.0)
pH, Arterial: 7.375 (ref 7.350–7.450)
pH, Arterial: 7.376 (ref 7.350–7.450)
pH, Arterial: 7.467 — ABNORMAL HIGH (ref 7.350–7.450)
pO2, Arterial: 50 mmHg — ABNORMAL LOW (ref 83.0–108.0)
pO2, Arterial: 71 mmHg — ABNORMAL LOW (ref 83.0–108.0)
pO2, Arterial: 85 mmHg (ref 83.0–108.0)

## 2016-11-18 LAB — GLUCOSE, CAPILLARY
Glucose-Capillary: 115 mg/dL — ABNORMAL HIGH (ref 65–99)
Glucose-Capillary: 123 mg/dL — ABNORMAL HIGH (ref 65–99)
Glucose-Capillary: 126 mg/dL — ABNORMAL HIGH (ref 65–99)
Glucose-Capillary: 130 mg/dL — ABNORMAL HIGH (ref 65–99)
Glucose-Capillary: 131 mg/dL — ABNORMAL HIGH (ref 65–99)
Glucose-Capillary: 133 mg/dL — ABNORMAL HIGH (ref 65–99)
Glucose-Capillary: 134 mg/dL — ABNORMAL HIGH (ref 65–99)
Glucose-Capillary: 136 mg/dL — ABNORMAL HIGH (ref 65–99)
Glucose-Capillary: 136 mg/dL — ABNORMAL HIGH (ref 65–99)
Glucose-Capillary: 137 mg/dL — ABNORMAL HIGH (ref 65–99)
Glucose-Capillary: 139 mg/dL — ABNORMAL HIGH (ref 65–99)
Glucose-Capillary: 144 mg/dL — ABNORMAL HIGH (ref 65–99)
Glucose-Capillary: 160 mg/dL — ABNORMAL HIGH (ref 65–99)
Glucose-Capillary: 183 mg/dL — ABNORMAL HIGH (ref 65–99)
Glucose-Capillary: 207 mg/dL — ABNORMAL HIGH (ref 65–99)
Glucose-Capillary: 211 mg/dL — ABNORMAL HIGH (ref 65–99)
Glucose-Capillary: 90 mg/dL (ref 65–99)

## 2016-11-18 LAB — PREPARE FRESH FROZEN PLASMA
Unit division: 0
Unit division: 0

## 2016-11-18 LAB — POCT I-STAT, CHEM 8
BUN: 12 mg/dL (ref 6–20)
Calcium, Ion: 1.14 mmol/L — ABNORMAL LOW (ref 1.15–1.40)
Chloride: 104 mmol/L (ref 101–111)
Creatinine, Ser: 0.7 mg/dL (ref 0.61–1.24)
Glucose, Bld: 198 mg/dL — ABNORMAL HIGH (ref 65–99)
HCT: 31 % — ABNORMAL LOW (ref 39.0–52.0)
Hemoglobin: 10.5 g/dL — ABNORMAL LOW (ref 13.0–17.0)
Potassium: 4.4 mmol/L (ref 3.5–5.1)
Sodium: 138 mmol/L (ref 135–145)
TCO2: 20 mmol/L (ref 0–100)

## 2016-11-18 LAB — CBC
HCT: 35 % — ABNORMAL LOW (ref 39.0–52.0)
HCT: 35.6 % — ABNORMAL LOW (ref 39.0–52.0)
Hemoglobin: 12 g/dL — ABNORMAL LOW (ref 13.0–17.0)
Hemoglobin: 11.8 g/dL — ABNORMAL LOW (ref 13.0–17.0)
MCH: 30.2 pg (ref 26.0–34.0)
MCH: 30 pg (ref 26.0–34.0)
MCHC: 34.3 g/dL (ref 30.0–36.0)
MCHC: 33.1 g/dL (ref 30.0–36.0)
MCV: 87.9 fL (ref 78.0–100.0)
MCV: 90.6 fL (ref 78.0–100.0)
Platelets: 171 10*3/uL (ref 150–400)
Platelets: 149 10*3/uL — ABNORMAL LOW (ref 150–400)
RBC: 3.98 MIL/uL — ABNORMAL LOW (ref 4.22–5.81)
RBC: 3.93 MIL/uL — ABNORMAL LOW (ref 4.22–5.81)
RDW: 14 % (ref 11.5–15.5)
RDW: 14.6 % (ref 11.5–15.5)
WBC: 14.4 10*3/uL — ABNORMAL HIGH (ref 4.0–10.5)
WBC: 11.9 10*3/uL — ABNORMAL HIGH (ref 4.0–10.5)

## 2016-11-18 LAB — BASIC METABOLIC PANEL
Anion gap: 6 (ref 5–15)
BUN: 8 mg/dL (ref 6–20)
CO2: 21 mmol/L — ABNORMAL LOW (ref 22–32)
Calcium: 8.1 mg/dL — ABNORMAL LOW (ref 8.9–10.3)
Chloride: 109 mmol/L (ref 101–111)
Creatinine, Ser: 0.71 mg/dL (ref 0.61–1.24)
GFR calc Af Amer: >60 mL/min (ref >60)
GFR calc non Af Amer: >60 mL/min (ref >60)
Glucose, Bld: 127 mg/dL — ABNORMAL HIGH (ref 65–99)
Potassium: 4 mmol/L (ref 3.5–5.1)
Sodium: 136 mmol/L (ref 135–145)

## 2016-11-18 LAB — BLOOD GAS, ARTERIAL
Acid-base deficit: 2.8 mmol/L — ABNORMAL HIGH (ref 0.0–2.0)
Bicarbonate: 21.2 mmol/L (ref 20.0–28.0)
Drawn by: 448981
O2 Content: 4 L/min
O2 Saturation: 96.2 %
Patient temperature: 98.4
pCO2 arterial: 34.6 mmHg (ref 32.0–48.0)
pH, Arterial: 7.403 (ref 7.350–7.450)
pO2, Arterial: 82.6 mmHg — ABNORMAL LOW (ref 83.0–108.0)

## 2016-11-18 LAB — MAGNESIUM
Magnesium: 2.5 mg/dL — ABNORMAL HIGH (ref 1.7–2.4)
Magnesium: 2.4 mg/dL (ref 1.7–2.4)

## 2016-11-18 LAB — HEPATIC FUNCTION PANEL
ALT: 59 U/L (ref 17–63)
AST: 52 U/L — ABNORMAL HIGH (ref 15–41)
Albumin: 3.1 g/dL — ABNORMAL LOW (ref 3.5–5.0)
Alkaline Phosphatase: 49 U/L (ref 38–126)
Bilirubin, Direct: 0.3 mg/dL (ref 0.1–0.5)
Indirect Bilirubin: 1.4 mg/dL — ABNORMAL HIGH (ref 0.3–0.9)
Total Bilirubin: 1.7 mg/dL — ABNORMAL HIGH (ref 0.3–1.2)
Total Protein: 5.7 g/dL — ABNORMAL LOW (ref 6.5–8.1)

## 2016-11-18 LAB — BPAM FFP
Blood Product Expiration Date: 201807152359
Blood Product Expiration Date: 201807152359
ISSUE DATE / TIME: 201807101250
ISSUE DATE / TIME: 201807101258
Unit Type and Rh: 5100
Unit Type and Rh: 5100

## 2016-11-18 LAB — CREATININE, SERUM
Creatinine, Ser: 0.84 mg/dL (ref 0.61–1.24)
GFR calc Af Amer: >60 mL/min (ref >60)
GFR calc non Af Amer: >60 mL/min (ref >60)

## 2016-11-18 LAB — TSH: TSH: 4.353 u[IU]/mL (ref 0.350–4.500)

## 2016-11-18 MED ORDER — INSULIN ASPART 100 UNIT/ML ~~LOC~~ SOLN
0.0000 [IU] | SUBCUTANEOUS | Status: DC
  Administered 2016-11-18 (×2): 4 [IU] via SUBCUTANEOUS
  Administered 2016-11-18: 8 [IU] via SUBCUTANEOUS
  Administered 2016-11-19: 4 [IU] via SUBCUTANEOUS
  Administered 2016-11-19 (×3): 8 [IU] via SUBCUTANEOUS
  Administered 2016-11-19: 12 [IU] via SUBCUTANEOUS
  Administered 2016-11-20: 2 [IU] via SUBCUTANEOUS
  Administered 2016-11-20 (×2): 8 [IU] via SUBCUTANEOUS
  Administered 2016-11-20 – 2016-11-21 (×4): 4 [IU] via SUBCUTANEOUS
  Administered 2016-11-21: 8 [IU] via SUBCUTANEOUS
  Administered 2016-11-21: 4 [IU] via SUBCUTANEOUS
  Administered 2016-11-21: 2 [IU] via SUBCUTANEOUS
  Administered 2016-11-21 – 2016-11-22 (×2): 4 [IU] via SUBCUTANEOUS
  Administered 2016-11-22 – 2016-11-23 (×5): 2 [IU] via SUBCUTANEOUS

## 2016-11-18 MED ORDER — FUROSEMIDE 10 MG/ML IJ SOLN
40.0000 mg | Freq: Once | INTRAMUSCULAR | Status: AC
  Administered 2016-11-18: 40 mg via INTRAVENOUS
  Filled 2016-11-18: qty 4

## 2016-11-18 MED ORDER — FUROSEMIDE 10 MG/ML IJ SOLN
20.0000 mg | Freq: Two times a day (BID) | INTRAMUSCULAR | Status: DC
  Administered 2016-11-18: 20 mg via INTRAVENOUS
  Filled 2016-11-18: qty 2

## 2016-11-18 MED ORDER — AMIODARONE HCL IN DEXTROSE 360-4.14 MG/200ML-% IV SOLN
INTRAVENOUS | Status: AC
  Filled 2016-11-18: qty 200

## 2016-11-18 MED ORDER — INSULIN GLARGINE 100 UNIT/ML ~~LOC~~ SOLN
10.0000 [IU] | Freq: Once | SUBCUTANEOUS | Status: AC
  Administered 2016-11-18: 10 [IU] via SUBCUTANEOUS
  Filled 2016-11-18: qty 0.1

## 2016-11-18 MED ORDER — INSULIN DETEMIR 100 UNIT/ML ~~LOC~~ SOLN: 10.0000 [IU] | Freq: Every day | SUBCUTANEOUS | Status: DC

## 2016-11-18 MED ORDER — INSULIN GLARGINE 100 UNIT/ML ~~LOC~~ SOLN
20.0000 [IU] | Freq: Two times a day (BID) | SUBCUTANEOUS | Status: DC
  Administered 2016-11-19 – 2016-11-24 (×12): 20 [IU] via SUBCUTANEOUS
  Filled 2016-11-18 (×14): qty 0.2

## 2016-11-18 MED ORDER — FUROSEMIDE 10 MG/ML IJ SOLN
40.0000 mg | Freq: Two times a day (BID) | INTRAMUSCULAR | Status: AC
  Administered 2016-11-18: 20 mg via INTRAVENOUS
  Filled 2016-11-18: qty 4

## 2016-11-18 MED ORDER — AMIODARONE HCL 200 MG PO TABS
400.0000 mg | ORAL_TABLET | Freq: Two times a day (BID) | ORAL | Status: DC
  Filled 2016-11-18: qty 2

## 2016-11-18 MED ORDER — CHLORHEXIDINE GLUCONATE CLOTH 2 % EX PADS
6.0000 | MEDICATED_PAD | Freq: Every day | CUTANEOUS | Status: DC
  Administered 2016-11-19 – 2016-11-23 (×5): 6 via TOPICAL

## 2016-11-18 MED ORDER — AMIODARONE HCL 200 MG PO TABS: 400.0000 mg | ORAL_TABLET | Freq: Every day | ORAL | Status: DC

## 2016-11-18 MED ORDER — LIDOCAINE 5 % EX PTCH
1.0000 | MEDICATED_PATCH | CUTANEOUS | Status: DC
  Administered 2016-11-18 – 2016-11-25 (×8): 1 via TRANSDERMAL
  Filled 2016-11-18 (×10): qty 1

## 2016-11-18 MED ORDER — AMIODARONE HCL IN DEXTROSE 360-4.14 MG/200ML-% IV SOLN
30.0000 mg/h | INTRAVENOUS | Status: AC
  Administered 2016-11-18: 30 mg/h via INTRAVENOUS

## 2016-11-18 MED ORDER — AMIODARONE HCL IN DEXTROSE 360-4.14 MG/200ML-% IV SOLN
60.0000 mg/h | INTRAVENOUS | Status: AC
  Administered 2016-11-18: 60 mg/h via INTRAVENOUS
  Filled 2016-11-18: qty 200

## 2016-11-18 MED ORDER — POTASSIUM CHLORIDE CRYS ER 20 MEQ PO TBCR
20.0000 meq | EXTENDED_RELEASE_TABLET | Freq: Once | ORAL | Status: AC
  Administered 2016-11-18: 20 meq via ORAL
  Filled 2016-11-18: qty 1

## 2016-11-18 MED ORDER — INSULIN ASPART 100 UNIT/ML ~~LOC~~ SOLN: 0.0000 [IU] | SUBCUTANEOUS | Status: DC

## 2016-11-18 MED ORDER — INSULIN DETEMIR 100 UNIT/ML ~~LOC~~ SOLN: 20.0000 [IU] | Freq: Two times a day (BID) | SUBCUTANEOUS | Status: DC

## 2016-11-18 NOTE — Procedures (Signed)
Extubation Procedure Note  Patient Details:   Name: [name] [name] DOB: 08/28/1960 MRN: [id number]   Airway Documentation:  Airway 8 mm (Active)  Secured at (cm) 23 cm 11/17/2016  7:59 PM  Measured From Lips 11/17/2016  7:59 PM  Secured Location Right 11/17/2016  7:59 PM  Secured By Pink Tape 11/17/2016  7:59 PM  Site Condition Dry 11/17/2016  7:59 PM    Evaluation  O2 sats: stable throughout Complications: No apparent complications Patient did tolerate procedure well. Bilateral Breath Sounds: Clear, Diminished   Yes   Pt passed all pulmonary mechanics and weaning criteria. Extubation procedure was clearly explained to the patient. Pt was extubated to a 4L Kim w/ bubble O2 humidity provided. Pt was able to speak and state his name post-extubation. Pt is alert and oriented and following commands.   [name] [name], BS, RRT, RCP 11/18/2016, 12:14 AM

## 2016-11-18 NOTE — Progress Notes (Signed)
Pt Passed pulmonary mechanics were the following with good patient effort.  NIF: -42 VC: 1.6L

## 2016-11-18 NOTE — Progress Notes (Signed)
Patient ID: Kathleen Arguehomas D Alarid, male   DOB: 09/26/1960, 56 y.o.   MRN: [id number] EVENING ROUNDS NOTE :     [address]       [place] [id number]             [phone]                 1 Day Post-Op Procedure(s) (LRB): CORONARY ARTERY BYPASS GRAFTING (CABG) x 5, USING RIGHT INTERNAL MAMMARY ARTERY AND RIGHT GREATER SAPHENOUS VEIN HARVESTED ENDOSCOPICALLY (N/A) TRANSESOPHAGEAL ECHOCARDIOGRAM (TEE) (N/A)  Total Length of Stay:  LOS: 5 days  BP 90/61   Pulse 90   Temp 98.6 F (37 C) (Oral)   Resp 16   Ht 5\' 11"  (1.803 m)   Wt 225 lb 5 oz (102.2 kg)   SpO2 92%   BMI 31.42 kg/m   .Intake/Output      07/11 0701 - 07/12 0700   P.O. 360   I.V. (mL/kg) 335.8 (3.3)   Blood    IV Piggyback 50   Total Intake(mL/kg) 745.8 (7.3)   Urine (mL/kg/hr) 1275 (1)   Blood    Chest Tube 110 (0.1)   Total Output 1385   Net -639.2         . sodium chloride 20 mL/hr at 11/17/16 2000  . sodium chloride    . sodium chloride 10 mL/hr at 11/17/16 2000  . amiodarone 60 mg/hr (11/18/16 1734)   Followed by  . amiodarone    . cefUROXime (ZINACEF)  IV Stopped (11/18/16 0805)  . lactated ringers 20 mL/hr at 11/17/16 2000  . lactated ringers 10 mL/hr at 11/17/16 1531  . milrinone 0.25 mcg/kg/min (11/17/16 2000)  . nitroGLYCERIN    . phenylephrine (NEO-SYNEPHRINE) Adult infusion Stopped (11/18/16 0530)     Lab Results  Component Value Date   WBC 11.9 (H) 11/18/2016   HGB 10.5 (L) 11/18/2016   HCT 31.0 (L) 11/18/2016   PLT 149 (L) 11/18/2016   GLUCOSE 198 (H) 11/18/2016   CHOL 111 11/13/2016   TRIG 244 (H) 11/13/2016   HDL 26 (L) 11/13/2016   LDLCALC 36 11/13/2016   ALT 59 11/18/2016   AST 52 (H) 11/18/2016   NA 138 11/18/2016   K 4.4 11/18/2016   CL 104 11/18/2016   CREATININE 0.70 11/18/2016   BUN 12 11/18/2016   CO2 21 (L) 11/18/2016   TSH 4.353 11/18/2016   INR 1.07 11/13/2016   HGBA1C 12.0 (H) 11/13/2016   MICROALBUR 2.6 08/06/2015  started on iv Cordarone  for afib   Now with sinus with bursts of afib    Delight OvensEdward B Shaheim Mahar MD  Beeper [phone] Office [phone] 11/18/2016 7:49 PM

## 2016-11-18 NOTE — Progress Notes (Signed)
  Amiodarone Drug - Drug Interaction Consult Note  Recommendations: No medication changes needed  Amiodarone is metabolized by the cytochrome P450 system and therefore has the potential to cause many drug interactions. Amiodarone has an average plasma half-life of 50 days (range 20 to 100 days).   There is potential for drug interactions to occur several weeks or months after stopping treatment and the onset of drug interactions may be slow after initiating amiodarone.   [x]  Statins: Increased risk of myopathy. Simvastatin- restrict dose to 20mg  daily. Other statins: counsel patients to report any muscle pain or weakness immediately. Current atorvastatin 80mg  qd  []  Anticoagulants: Amiodarone can increase anticoagulant effect. Consider warfarin dose reduction. Patients should be monitored closely and the dose of anticoagulant altered accordingly, remembering that amiodarone levels take several weeks to stabilize.  []  Antiepileptics: Amiodarone can increase plasma concentration of phenytoin, the dose should be reduced. Note that small changes in phenytoin dose can result in large changes in levels. Monitor patient and counsel on signs of toxicity.  [x]  Beta blockers: increased risk of bradycardia, AV block and myocardial depression. Sotalol - avoid concomitant use.  Current Metoprolol 12.5mg  BID  []   Calcium channel blockers (diltiazem and verapamil): increased risk of bradycardia, AV block and myocardial depression.  []   Cyclosporine: Amiodarone increases levels of cyclosporine. Reduced dose of cyclosporine is recommended.  []  Digoxin dose should be halved when amiodarone is started.  []  Diuretics: increased risk of cardiotoxicity if hypokalemia occurs.  []  Oral hypoglycemic agents (glyburide, glipizide, glimepiride): increased risk of hypoglycemia. Patient's glucose levels should be monitored closely when initiating amiodarone therapy.   []  Drugs that prolong the QT interval:  Torsades  de pointes risk may be increased with concurrent use - avoid if possible.  Monitor QTc, also keep magnesium/potassium WNL if concurrent therapy can't be avoided. Marland Kitchen. Antibiotics: e.g. fluoroquinolones, erythromycin. . Antiarrhythmics: e.g. quinidine, procainamide, disopyramide, sotalol. . Antipsychotics: e.g. phenothiazines, haloperidol.  . Lithium, tricyclic antidepressants, and methadone. Thank You,   Leota SauersLisa Narcissa Melder Pharm.D. CPP, BCPS Clinical Pharmacist [phone] 11/18/2016 5:50 PM

## 2016-11-18 NOTE — Progress Notes (Addendum)
TCTS DAILY ICU PROGRESS NOTE                   [address]            [place] [id number]          [phone]   1 Day Post-Op Procedure(s) (LRB): CORONARY ARTERY BYPASS GRAFTING (CABG) x 5, USING RIGHT INTERNAL MAMMARY ARTERY AND RIGHT GREATER SAPHENOUS VEIN HARVESTED ENDOSCOPICALLY (N/A) TRANSESOPHAGEAL ECHOCARDIOGRAM (TEE) (N/A)  Total Length of Stay:  LOS: 5 days   Subjective: Patient with a lot of sternal incisional pain this am.  Objective: Vital signs in last 24 hours: Temp:  [97.5 F (36.4 C)-101.4 F (38.6 C)] 99.7 F (37.6 C) (07/11 0700) Pulse Rate:  [86-106] 98 (07/11 0700) Cardiac Rhythm: Normal sinus rhythm (07/11 0000) Resp:  [15-37] 30 (07/11 0700) BP: (89-137)/(56-84) 119/66 (07/11 0700) SpO2:  [91 %-100 %] 91 % (07/11 0700) Arterial Line BP: (58-114)/(45-68) 87/60 (07/11 0700) FiO2 (%):  [40 %-50 %] 40 % (07/10 2335) Weight:  [102.2 kg (225 lb 5 oz)] 102.2 kg (225 lb 5 oz) (07/11 0500)  Filed Weights   11/16/16 0504 11/17/16 0538 11/18/16 0500  Weight: 94.4 kg (208 lb 3.2 oz) 94.1 kg (207 lb 8 oz) 102.2 kg (225 lb 5 oz)    Weight change: 8.079 kg (17 lb 13 oz)   Hemodynamic parameters for last 24 hours: PAP: (26-48)/(13-25) 38/22 CO:  [5.5 L/min-9.9 L/min] 9 L/min CI:  [2.2 L/min/m2-4.6 L/min/m2] 4.2 L/min/m2  Intake/Output from previous day: 07/10 0701 - 07/11 0700 In: 5682.3 [P.O.:120; I.V.:3478.3; Blood:784; IV Piggyback:1300] Out: 5515 [Urine:4105; Blood:850; Chest Tube:560]  Intake/Output this shift: No intake/output data recorded.  Current Meds: Scheduled Meds: . acetaminophen  1,000 mg Oral Q6H   Or  . acetaminophen (TYLENOL) oral liquid 160 mg/5 mL  1,000 mg Per Tube Q6H  . aspirin EC  325 mg Oral Daily   Or  . aspirin  324 mg Per Tube Daily  . atorvastatin  80 mg Oral q1800  . bisacodyl  10 mg Oral Daily   Or  . bisacodyl  10 mg Rectal Daily  . clotrimazole   Topical BID  . docusate sodium  200 mg Oral Daily    . gabapentin  600 mg Oral TID  . insulin regular  0-10 Units Intravenous TID WC  . metoCLOPramide (REGLAN) injection  10 mg Intravenous Q6H  . metoprolol tartrate  12.5 mg Oral BID   Or  . metoprolol tartrate  12.5 mg Per Tube BID  . [START ON 11/19/2016] pantoprazole  40 mg Oral Daily  . sodium chloride flush  3 mL Intravenous Q12H  . traZODone  100 mg Oral QHS   Continuous Infusions: . sodium chloride 20 mL/hr at 11/17/16 2000  . sodium chloride    . sodium chloride 10 mL/hr at 11/17/16 2000  . albumin human    . cefUROXime (ZINACEF)  IV Stopped (11/17/16 2021)  . dexmedetomidine (PRECEDEX) IV infusion Stopped (11/17/16 1830)  . DOPamine 2 mcg/kg/min (11/17/16 2000)  . insulin (NOVOLIN-R) infusion 3.4 Units/hr (11/18/16 0700)  . lactated ringers    . lactated ringers 20 mL/hr at 11/17/16 2000  . lactated ringers 10 mL/hr at 11/17/16 1531  . milrinone 0.25 mcg/kg/min (11/17/16 2000)  . nitroGLYCERIN    . phenylephrine (NEO-SYNEPHRINE) Adult infusion Stopped (11/18/16 0530)   PRN Meds:.sodium chloride, albumin human, guaiFENesin-dextromethorphan, lactated ringers, metoprolol tartrate, midazolam, morphine injection, ondansetron (ZOFRAN) IV, oxyCODONE, sodium chloride  flush, traMADol  General appearance: alert, cooperative and no distress Neurologic: intact Heart: Slightly tachy, rub with CTs in place Lungs: Diminshed at bases Abdomen: Soft, non tender, some bowel sounds present Extremities: RLE is wrapped with ACE and SCD on LLE Wound: Aquacel intact  Lab Results: CBC: Recent Labs  11/17/16 2135 11/17/16 2146 11/18/16 0410  WBC 12.0*  --  14.4*  HGB 12.0* 9.2* 12.0*  HCT 35.0* 27.0* 35.0*  PLT 153  --  171   BMET:  Recent Labs  11/17/16 2135 11/17/16 2146 11/18/16 0410  NA 136 145 136  K 4.3 2.8* 4.0  CL 111 115* 109  CO2 21*  --  21*  GLUCOSE 137* 98 127*  BUN 7 <3* 8  CREATININE 0.64 0.40* 0.71  CALCIUM 7.9*  --  8.1*    CMET: Lab Results  Component  Value Date   WBC 14.4 (H) 11/18/2016   HGB 12.0 (L) 11/18/2016   HCT 35.0 (L) 11/18/2016   PLT 171 11/18/2016   GLUCOSE 127 (H) 11/18/2016   CHOL 111 11/13/2016   TRIG 244 (H) 11/13/2016   HDL 26 (L) 11/13/2016   LDLCALC 36 11/13/2016   ALT 22 04/27/2015   AST 20 04/27/2015   NA 136 11/18/2016   K 4.0 11/18/2016   CL 109 11/18/2016   CREATININE 0.71 11/18/2016   BUN 8 11/18/2016   CO2 21 (L) 11/18/2016   TSH 1.144 11/13/2016   INR 1.07 11/13/2016   HGBA1C 12.0 (H) 11/13/2016   MICROALBUR 2.6 08/06/2015      PT/INR: No results for input(s): LABPROT, INR in the last 72 hours. Radiology: Dg Chest Port 1 View  Result Date: 11/17/2016 CLINICAL DATA:  Status post CABG EXAM: PORTABLE CHEST 1 VIEW COMPARISON:  11/12/2016 FINDINGS: Endotracheal tube with the tip 3.3 cm above the carina. Nasogastric tube coursing below the diaphragm projecting over the stomach. Swan-Ganz catheter with the tip projecting over the right ventricular outflow tract. Interval CABG. Stable cardiomediastinal silhouette. Mild bilateral interstitial thickening. Left-sided chest tube. No pneumothorax. Low lung volumes. IMPRESSION: 1. Support lines and tubing in satisfactory position. 2. Low lung volumes with mild bilateral interstitial thickening. Electronically Signed   By: Elige Ko   On: 11/17/2016 15:52    Assessment/Plan: S/P Procedure(s) (LRB): CORONARY ARTERY BYPASS GRAFTING (CABG) x 5, USING RIGHT INTERNAL MAMMARY ARTERY AND RIGHT GREATER SAPHENOUS VEIN HARVESTED ENDOSCOPICALLY (N/A) TRANSESOPHAGEAL ECHOCARDIOGRAM (TEE) (N/A)  1.CV-SR in the low 100's. On Milrinone and Dopamine drips, Lopressor 12.5 mg bid. Wean Dopamine as able.CO/CI 7.6/3.6. Dopamine likely contributing to mild tachycardia. Check co ox in am. 2.Pulmonary-Chest tubes with 560 cc since surgery. Will leave for now.On 3 liters of oxygen via [place]. Will wean as able over the next few days. CXR this am appears to show low lung volumes,bibasilar  atelectasis and pleural effusions. Encourage incentive spirometer and flutter valve. 3.Volume overload-will give Lasix 20 mg IV bid today 4.ABL anemia-H and H stable at 12 and 35. 5.DM-CBGs 123/115/90. On Insulin drip. Will order scheduled Insulin to transition off drip. Pre op HGA1C 12. Will need close medical follow up after discharge. 6. Please see progression orders.    ZIMMERMAN,DONIELLE M PA-C 11/18/2016 7:16 AM   Follow progression orders but am PO2 is 51- increase lasix to 40 bid patient examined and medical record reviewed,agree with above note. Kathlee Nations Trigt III 11/18/2016

## 2016-11-19 ENCOUNTER — Inpatient Hospital Stay (HOSPITAL_COMMUNITY): Payer: Medicare Other

## 2016-11-19 LAB — BPAM RBC
Blood Product Expiration Date: 201807252359
Blood Product Expiration Date: 201807292359
ISSUE DATE / TIME: 201807061021
Unit Type and Rh: 5100
Unit Type and Rh: 5100

## 2016-11-19 LAB — BASIC METABOLIC PANEL
Anion gap: 5 (ref 5–15)
BUN: 13 mg/dL (ref 6–20)
CO2: 23 mmol/L (ref 22–32)
Calcium: 7.7 mg/dL — ABNORMAL LOW (ref 8.9–10.3)
Chloride: 106 mmol/L (ref 101–111)
Creatinine, Ser: 0.78 mg/dL (ref 0.61–1.24)
GFR calc Af Amer: >60 mL/min (ref >60)
GFR calc non Af Amer: >60 mL/min (ref >60)
Glucose, Bld: 191 mg/dL — ABNORMAL HIGH (ref 65–99)
Potassium: 3.9 mmol/L (ref 3.5–5.1)
Sodium: 134 mmol/L — ABNORMAL LOW (ref 135–145)

## 2016-11-19 LAB — GLUCOSE, CAPILLARY
Glucose-Capillary: 170 mg/dL — ABNORMAL HIGH (ref 65–99)
Glucose-Capillary: 184 mg/dL — ABNORMAL HIGH (ref 65–99)
Glucose-Capillary: 259 mg/dL — ABNORMAL HIGH (ref 65–99)
Glucose-Capillary: 240 mg/dL — ABNORMAL HIGH (ref 65–99)
Glucose-Capillary: 232 mg/dL — ABNORMAL HIGH (ref 65–99)
Glucose-Capillary: 232 mg/dL — ABNORMAL HIGH (ref 65–99)

## 2016-11-19 LAB — TYPE AND SCREEN
ABO/RH(D): O POS
Antibody Screen: NEGATIVE
Unit division: 0
Unit division: 0

## 2016-11-19 LAB — CBC
HCT: 31 % — ABNORMAL LOW (ref 39.0–52.0)
Hemoglobin: 10.6 g/dL — ABNORMAL LOW (ref 13.0–17.0)
MCH: 30.9 pg (ref 26.0–34.0)
MCHC: 34.2 g/dL (ref 30.0–36.0)
MCV: 90.4 fL (ref 78.0–100.0)
Platelets: 111 10*3/uL — ABNORMAL LOW (ref 150–400)
RBC: 3.43 MIL/uL — ABNORMAL LOW (ref 4.22–5.81)
RDW: 14.5 % (ref 11.5–15.5)
WBC: 9.3 10*3/uL (ref 4.0–10.5)

## 2016-11-19 LAB — COOXEMETRY PANEL
Carboxyhemoglobin: 1.8 % — ABNORMAL HIGH (ref 0.5–1.5)
Methemoglobin: 1.1 % (ref 0.0–1.5)
O2 Saturation: 62.3 %
Total hemoglobin: 10.9 g/dL — ABNORMAL LOW (ref 12.0–16.0)

## 2016-11-19 MED ORDER — FENTANYL 25 MCG/HR TD PT72: 50.0000 ug | MEDICATED_PATCH | TRANSDERMAL | Status: DC

## 2016-11-19 MED ORDER — AMIODARONE HCL IN DEXTROSE 360-4.14 MG/200ML-% IV SOLN
INTRAVENOUS | Status: AC
  Filled 2016-11-19: qty 200

## 2016-11-19 MED ORDER — AMIODARONE HCL 200 MG PO TABS
400.0000 mg | ORAL_TABLET | Freq: Two times a day (BID) | ORAL | Status: DC
  Administered 2016-11-19 – 2016-11-25 (×13): 400 mg via ORAL
  Filled 2016-11-19 (×13): qty 2

## 2016-11-19 MED ORDER — FENTANYL 25 MCG/HR TD PT72
25.0000 ug | MEDICATED_PATCH | TRANSDERMAL | Status: DC
  Administered 2016-11-19 – 2016-11-25 (×3): 25 ug via TRANSDERMAL
  Filled 2016-11-19 (×3): qty 1

## 2016-11-19 MED ORDER — POTASSIUM CHLORIDE CRYS ER 20 MEQ PO TBCR
30.0000 meq | EXTENDED_RELEASE_TABLET | Freq: Two times a day (BID) | ORAL | Status: AC
  Administered 2016-11-19 (×2): 30 meq via ORAL
  Filled 2016-11-19 (×2): qty 1

## 2016-11-19 MED ORDER — AMIODARONE HCL 200 MG PO TABS: 400.0000 mg | ORAL_TABLET | Freq: Every day | ORAL | Status: DC

## 2016-11-19 MED ORDER — FUROSEMIDE 10 MG/ML IJ SOLN
40.0000 mg | Freq: Two times a day (BID) | INTRAMUSCULAR | Status: AC
  Administered 2016-11-19 (×2): 40 mg via INTRAVENOUS
  Filled 2016-11-19 (×2): qty 4

## 2016-11-19 NOTE — Progress Notes (Signed)
Called pharmacy to verify pt meets the PO amiodarone conversion criteria per the order, pharmacy verified that pt meets criteria but suggested that dose be adjusted to 07:00. Will adjust and inform oncoming nurse.

## 2016-11-19 NOTE — Progress Notes (Signed)
Progress Note  Patient Name: [name] [name] Date of Encounter: 11/19/2016  Primary Cardiologist: Jens Somrenshaw  Subjective   Incisional pain.   Inpatient Medications    Scheduled Meds: . acetaminophen  1,000 mg Oral Q6H   Or  . acetaminophen (TYLENOL) oral liquid 160 mg/5 mL  1,000 mg Per Tube Q6H  . amiodarone  400 mg Oral Q12H   Followed by  . [START ON 11/26/2016] amiodarone  400 mg Oral Daily  . aspirin EC  325 mg Oral Daily   Or  . aspirin  324 mg Per Tube Daily  . atorvastatin  80 mg Oral q1800  . bisacodyl  10 mg Oral Daily   Or  . bisacodyl  10 mg Rectal Daily  . Chlorhexidine Gluconate Cloth  6 each Topical Daily  . clotrimazole   Topical BID  . docusate sodium  200 mg Oral Daily  . fentaNYL  25 mcg Transdermal Q72H  . furosemide  40 mg Intravenous BID  . gabapentin  600 mg Oral TID  . insulin aspart  0-24 Units Subcutaneous Q4H  . insulin glargine  20 Units Subcutaneous BID  . lidocaine  1 patch Transdermal Q24H  . metoCLOPramide (REGLAN) injection  10 mg Intravenous Q6H  . metoprolol tartrate  12.5 mg Oral BID   Or  . metoprolol tartrate  12.5 mg Per Tube BID  . pantoprazole  40 mg Oral Daily  . potassium chloride  30 mEq Oral BID  . sodium chloride flush  3 mL Intravenous Q12H  . traZODone  100 mg Oral QHS   Continuous Infusions: . sodium chloride 20 mL/hr at 11/17/16 2000  . sodium chloride    . sodium chloride 10 mL/hr at 11/17/16 2000  . lactated ringers 10 mL/hr at 11/18/16 2000  . lactated ringers 10 mL/hr at 11/17/16 1531  . milrinone 0.25 mcg/kg/min (11/18/16 2351)  . nitroGLYCERIN    . phenylephrine (NEO-SYNEPHRINE) Adult infusion Stopped (11/18/16 0530)   PRN Meds: sodium chloride, guaiFENesin-dextromethorphan, metoprolol tartrate, midazolam, morphine injection, ondansetron (ZOFRAN) IV, oxyCODONE, sodium chloride flush, traMADol   Vital Signs    Vitals:   11/19/16 0400 11/19/16 0500 11/19/16 0600 11/19/16 0700  BP: 102/61  (!) 98/58  103/63  Pulse: 87 (!) 101 91 87  Resp: 16 (!) 27 20 14   Temp: 98.5 F (36.9 C)   99.1 F (37.3 C)  TempSrc: Oral   Oral  SpO2: 96% 91% 92% 95%  Weight:  224 lb (101.6 kg)    Height:        Intake/Output Summary (Last 24 hours) at 11/19/16 0856 Last data filed at 11/19/16 0700  Gross per 24 hour  Intake          1056.26 ml  Output             2315 ml  Net         -1258.74 ml   Filed Weights   11/17/16 0538 11/18/16 0500 11/19/16 0500  Weight: 207 lb 8 oz (94.1 kg) 225 lb 5 oz (102.2 kg) 224 lb (101.6 kg)    Telemetry    PAF on 7/11, now NSR - Personally Reviewed  ECG    NSR on 11/18/16 - Personally Reviewed  Physical Exam   GEN: No acute distress.   Neck: No JVD, IJ Cardiac: RRR, no murmurs, rubs, or gallops. Dressing noted Respiratory: Clear to auscultation bilaterally. GI: Soft, nontender, non-distended  MS: Trace edema; No deformity. SCDs Neuro:  Nonfocal  Psych:  Normal affect   Labs    Chemistry Recent Labs Lab 11/17/16 2135  11/18/16 0410 11/18/16 1708 11/18/16 1710 11/19/16 0330  NA 136  < > 136  --  138 134*  K 4.3  < > 4.0  --  4.4 3.9  CL 111  < > 109  --  104 106  CO2 21*  --  21*  --   --  23  GLUCOSE 137*  < > 127*  --  198* 191*  BUN 7  < > 8  --  12 13  CREATININE 0.64  < > 0.71 0.84 0.70 0.78  CALCIUM 7.9*  --  8.1*  --   --  7.7*  PROT  --   --   --  5.7*  --   --   ALBUMIN  --   --   --  3.1*  --   --   AST  --   --   --  52*  --   --   ALT  --   --   --  59  --   --   ALKPHOS  --   --   --  49  --   --   BILITOT  --   --   --  1.7*  --   --   GFRNONAA >60  --  >60 >60  --  >60  GFRAA >60  --  >60 >60  --  >60  ANIONGAP 4*  --  6  --   --  5  < > = values in this interval not displayed.   Hematology Recent Labs Lab 11/18/16 0410 11/18/16 1708 11/18/16 1710 11/19/16 0330  WBC 14.4* 11.9*  --  9.3  RBC 3.98* 3.93*  --  3.43*  HGB 12.0* 11.8* 10.5* 10.6*  HCT 35.0* 35.6* 31.0* 31.0*  MCV 87.9 90.6  --  90.4  MCH 30.2 30.0   --  30.9  MCHC 34.3 33.1  --  34.2  RDW 14.0 14.6  --  14.5  PLT 171 149*  --  111*    Cardiac Enzymes Recent Labs Lab 11/12/16 0914 11/12/16 1139 11/12/16 1423 11/12/16 2249  TROPONINI <0.03 <0.03 <0.03 <0.03   No results for input(s): TROPIPOC in the last 168 hours.   BNPNo results for input(s): BNP, PROBNP in the last 168 hours.   DDimer No results for input(s): DDIMER in the last 168 hours.   Radiology    Dg Chest Port 1 View  Result Date: 11/19/2016 CLINICAL DATA:  Post CABG EXAM: PORTABLE CHEST 1 VIEW COMPARISON:  11/19/2006 FINDINGS: Interval removal of Swan-Ganz catheter. Left chest tube remains in place. No pneumothorax. Changes of CABG. Cardiomegaly. Areas of atelectasis in the left lung. Low lung volumes. IMPRESSION: Left chest tube remains in place without pneumothorax. Areas of atelectasis in the left lung. Low lung volumes. Electronically Signed   By: Charlett Nose M.D.   On: 11/19/2016 08:05   Dg Chest Port 1 View  Result Date: 11/18/2016 CLINICAL DATA:  One day postop CABG. EXAM: PORTABLE CHEST 1 VIEW COMPARISON:  One-view chest x-ray 11/17/2016. FINDINGS: The patient has been extubated. NG tube was removed. A Swan-Ganz catheter terminates in the right main pulmonary outflow tract. The left-sided chest tube remains place. There is no pneumothorax. Lung volumes have decreased. Left greater than right pleural effusion has slightly increased. Bibasilar airspace disease is also slightly worse, left greater than right. Mild pulmonary vascular congestion is now present.  IMPRESSION: 1. Interval extubation and removal of NG tube. 2. Persistent left-sided chest tube without pneumothorax. 3. Slight increase and pulmonary vascular congestion, bilateral pleural effusions, and left greater than right lower lobe airspace disease, likely atelectasis. 4. Stable position of Swan-Ganz catheter within the right main pulmonary outflow tract. Electronically Signed   By: Marin Roberts  M.D.   On: 11/18/2016 08:43   Dg Chest Port 1 View  Result Date: 11/17/2016 CLINICAL DATA:  Status post CABG EXAM: PORTABLE CHEST 1 VIEW COMPARISON:  11/12/2016 FINDINGS: Endotracheal tube with the tip 3.3 cm above the carina. Nasogastric tube coursing below the diaphragm projecting over the stomach. Swan-Ganz catheter with the tip projecting over the right ventricular outflow tract. Interval CABG. Stable cardiomediastinal silhouette. Mild bilateral interstitial thickening. Left-sided chest tube. No pneumothorax. Low lung volumes. IMPRESSION: 1. Support lines and tubing in satisfactory position. 2. Low lung volumes with mild bilateral interstitial thickening. Electronically Signed   By: Elige Ko   On: 11/17/2016 15:52    Cardiac Studies   ECHO - EF 65%  Patient Profile     56 y.o. male with CAD post CABG with PAF on 7/11.   Assessment & Plan    PAF  - IV amio --> to PO amio now that he is in NSR  - no anticoagulation at this time.   CAD  - post cabg  Hypotension  - weaning milrinone per TCTS  Donato Schultz, MD   Signed, Donato Schultz, MD  11/19/2016, 8:56 AM

## 2016-11-19 NOTE — Progress Notes (Signed)
      [address]       [place] [id number]             [phone]      Comfortable at present  BP 104/64   Pulse 85   Temp 98.4 F (36.9 C) (Oral)   Resp 13   Ht 5\' 11"  (1.803 m)   Wt 224 lb (101.6 kg)   SpO2 94%   BMI 31.24 kg/m   in SR  Intake/Output Summary (Last 24 hours) at 11/19/16 1836 Last data filed at 11/19/16 1800  Gross per 24 hour  Intake           884.71 ml  Output             2065 ml  Net         -1180.29 ml   94% sat on 2 L Alexis  Continue present care  Viviann SpareSteven C. Dorris FetchHendrickson, MD [facility] [phone]

## 2016-11-19 NOTE — Progress Notes (Addendum)
TCTS DAILY ICU PROGRESS NOTE                   [address]            [place] [address]          [phone]   2 Days Post-Op Procedure(s) (LRB): CORONARY ARTERY BYPASS GRAFTING (CABG) x 5, USING RIGHT INTERNAL MAMMARY ARTERY AND RIGHT GREATER SAPHENOUS VEIN HARVESTED ENDOSCOPICALLY (N/A) TRANSESOPHAGEAL ECHOCARDIOGRAM (TEE) (N/A)  Total Length of Stay:  LOS: 6 days   Subjective: Patient with a lot of sternal incisional pain again this am.  Objective: Vital signs in last 24 hours: Temp:  [98.4 F (36.9 C)-100.1 F (37.8 C)] 98.5 F (36.9 C) (07/12 0400) Pulse Rate:  [78-105] 91 (07/12 0600) Cardiac Rhythm: Normal sinus rhythm (07/12 0400) Resp:  [14-32] 20 (07/12 0600) BP: (81-152)/(58-89) 98/58 (07/12 0600) SpO2:  [90 %-98 %] 92 % (07/12 0600) Arterial Line BP: (77-134)/(44-79) 79/46 (07/12 0600) Weight:  [101.6 kg (224 lb)] 101.6 kg (224 lb) (07/12 0500)  Filed Weights   11/17/16 0538 11/18/16 0500 11/19/16 0500  Weight: 94.1 kg (207 lb 8 oz) 102.2 kg (225 lb 5 oz) 101.6 kg (224 lb)    Weight change: -0.594 kg (-1 lb 5 oz)   Hemodynamic parameters for last 24 hours: PAP: (43)/(22) 43/22  Intake/Output from previous day: 07/11 0701 - 07/12 0700 In: 1300.7 [P.O.:360; I.V.:890.7; IV Piggyback:50] Out: 2325 [Urine:2075; Chest Tube:250]  Intake/Output this shift: No intake/output data recorded.  Current Meds: Scheduled Meds: . acetaminophen  1,000 mg Oral Q6H   Or  . acetaminophen (TYLENOL) oral liquid 160 mg/5 mL  1,000 mg Per Tube Q6H  . amiodarone  400 mg Oral Q12H   Followed by  . [START ON 11/26/2016] amiodarone  400 mg Oral Daily  . aspirin EC  325 mg Oral Daily   Or  . aspirin  324 mg Per Tube Daily  . atorvastatin  80 mg Oral q1800  . bisacodyl  10 mg Oral Daily   Or  . bisacodyl  10 mg Rectal Daily  . Chlorhexidine Gluconate Cloth  6 each Topical Daily  . clotrimazole   Topical BID  . docusate sodium  200 mg Oral Daily  .  gabapentin  600 mg Oral TID  . insulin aspart  0-24 Units Subcutaneous Q4H  . insulin glargine  20 Units Subcutaneous BID  . lidocaine  1 patch Transdermal Q24H  . metoCLOPramide (REGLAN) injection  10 mg Intravenous Q6H  . metoprolol tartrate  12.5 mg Oral BID   Or  . metoprolol tartrate  12.5 mg Per Tube BID  . pantoprazole  40 mg Oral Daily  . sodium chloride flush  3 mL Intravenous Q12H  . traZODone  100 mg Oral QHS   Continuous Infusions: . sodium chloride 20 mL/hr at 11/17/16 2000  . sodium chloride    . sodium chloride 10 mL/hr at 11/17/16 2000  . cefUROXime (ZINACEF)  IV Stopped (11/18/16 2059)  . lactated ringers 10 mL/hr at 11/18/16 2000  . lactated ringers 10 mL/hr at 11/17/16 1531  . milrinone 0.25 mcg/kg/min (11/18/16 2351)  . nitroGLYCERIN    . phenylephrine (NEO-SYNEPHRINE) Adult infusion Stopped (11/18/16 0530)   PRN Meds:.sodium chloride, guaiFENesin-dextromethorphan, metoprolol tartrate, midazolam, morphine injection, ondansetron (ZOFRAN) IV, oxyCODONE, sodium chloride flush, traMADol  General appearance: alert, cooperative and no distress Neurologic: intact Heart: RRR Lungs: Diminshed at bases Abdomen: Soft, non tender, bowel sounds present Extremities: Bilateral  LE edema Wound: Aquacel intact and right thigh dressing is clean and dry.  Lab Results: CBC:  Recent Labs  11/18/16 1708 11/18/16 1710 11/19/16 0330  WBC 11.9*  --  9.3  HGB 11.8* 10.5* 10.6*  HCT 35.6* 31.0* 31.0*  PLT 149*  --  111*   BMET:  Recent Labs  11/18/16 0410  11/18/16 1710 11/19/16 0330  NA 136  --  138 134*  K 4.0  --  4.4 3.9  CL 109  --  104 106  CO2 21*  --   --  23  GLUCOSE 127*  --  198* 191*  BUN 8  --  12 13  CREATININE 0.71  < > 0.70 0.78  CALCIUM 8.1*  --   --  7.7*  < > = values in this interval not displayed.  CMET: Lab Results  Component Value Date   WBC 9.3 11/19/2016   HGB 10.6 (L) 11/19/2016   HCT 31.0 (L) 11/19/2016   PLT 111 (L) 11/19/2016    GLUCOSE 191 (H) 11/19/2016   CHOL 111 11/13/2016   TRIG 244 (H) 11/13/2016   HDL 26 (L) 11/13/2016   LDLCALC 36 11/13/2016   ALT 59 11/18/2016   AST 52 (H) 11/18/2016   NA 134 (L) 11/19/2016   K 3.9 11/19/2016   CL 106 11/19/2016   CREATININE 0.78 11/19/2016   BUN 13 11/19/2016   CO2 23 11/19/2016   TSH 4.353 11/18/2016   INR 1.07 11/13/2016   HGBA1C 12.0 (H) 11/13/2016   MICROALBUR 2.6 08/06/2015      PT/INR: No results for input(s): LABPROT, INR in the last 72 hours. Radiology: No results found.  Assessment/Plan: S/P Procedure(s) (LRB): CORONARY ARTERY BYPASS GRAFTING (CABG) x 5, USING RIGHT INTERNAL MAMMARY ARTERY AND RIGHT GREATER SAPHENOUS VEIN HARVESTED ENDOSCOPICALLY (N/A) TRANSESOPHAGEAL ECHOCARDIOGRAM (TEE) (N/A)  1.CV-Went into a fib with RVR yesterday. SR in the low 90's this am. On Milrinone and Amiodarone drips and Lopressor 12.5 mg bid. Will stop Amiodarone drip and start oral. Would like to increase BB but BP labile. Co ox decreased to 62.3 Check co ox in am. 2.Pulmonary-Chest tubes with 250 cc last 24 hours (140 cc last 12 hours). Will leave for now but consider removing mediastinal.On 4 liters of oxygen via [place]. Will wean as able over the next few days. CXR this am appears to show low lung volumes, cardiomegaly, no pneumothorax, bibasilar atelectasis and pleural effusions. Encourage incentive spirometer and flutter valve. 3.Volume overload-will give Lasix 40 mg IV this am 4.ABL anemia-H and H stable at 12 and 35. 5.DM-CBGs 207/170/184. On Insulin drip. Will order scheduled Insulin to transition off drip. Pre op HGA1C 12. Will need close medical follow up after discharge. 6. Thrombocytopenia-platelets 111,000 7. Regarding incisional pain, will start a Duragesic patch. Will use while in hospital but he will be discharged with oral narcotic only. 8. Remove a line 9. Will leave foley for now  Elenore RotaZIMMERMAN,DONIELLE M PA-C 11/19/2016 7:28 AM   Patient seen and examined   Will d/c chest tubes  And aline Try low dose patch for pain  I have seen and examined [name] [name] and agree with the above assessment  and plan.  Delight OvensEdward B Kialee Kham MD [facility] [phone] Office [phone] 11/19/2016 8:51 AM

## 2016-11-19 NOTE — Care Management Note (Addendum)
Case Management Note  Patient Details  Name: [name] [name] MRN: [id number] Date of Birth: 09/28/1960  Subjective/Objective:  S/p CABG, From home with girlfriend, she will be with patient from 8 am to 2 pm,then sister will be with patient after 2 pm.  POD2 CABG  , d/ chest tubes and aline, went into afib yesterday, on milrinone, amio and lopressor.       [phone] Letha Capeeborah Nyonna Hargrove RN, BSN-  POD 6 CABG, ambulating well today, cont to diuresis, plan to transfer to SDU.             Action/Plan: NCM will cont to follow for dc needs.   Expected Discharge Date:                  Expected Discharge Plan:  Home w Home Health Services  In-House Referral:     Discharge planning Services  CM Consult  Post Acute Care Choice:    Choice offered to:     DME Arranged:    DME Agency:     HH Arranged:    HH Agency:     Status of Service:  In process, will continue to follow  If discussed at Long Length of Stay Meetings, dates discussed:    Additional Comments:  Leone Havenaylor, Eashan Schipani Clinton, RN 11/19/2016, 10:56 AM

## 2016-11-20 ENCOUNTER — Inpatient Hospital Stay (HOSPITAL_COMMUNITY): Payer: Medicare Other

## 2016-11-20 LAB — BASIC METABOLIC PANEL
Anion gap: 6 (ref 5–15)
BUN: 18 mg/dL (ref 6–20)
CO2: 26 mmol/L (ref 22–32)
Calcium: 8.1 mg/dL — ABNORMAL LOW (ref 8.9–10.3)
Chloride: 103 mmol/L (ref 101–111)
Creatinine, Ser: 0.83 mg/dL (ref 0.61–1.24)
GFR calc Af Amer: >60 mL/min (ref >60)
GFR calc non Af Amer: >60 mL/min (ref >60)
Glucose, Bld: 173 mg/dL — ABNORMAL HIGH (ref 65–99)
Potassium: 4.2 mmol/L (ref 3.5–5.1)
Sodium: 135 mmol/L (ref 135–145)

## 2016-11-20 LAB — COOXEMETRY PANEL
Carboxyhemoglobin: 1.9 % — ABNORMAL HIGH (ref 0.5–1.5)
Methemoglobin: 0.9 % (ref 0.0–1.5)
O2 Saturation: 53.5 %
Total hemoglobin: 11 g/dL — ABNORMAL LOW (ref 12.0–16.0)

## 2016-11-20 LAB — GLUCOSE, CAPILLARY
Glucose-Capillary: 153 mg/dL — ABNORMAL HIGH (ref 65–99)
Glucose-Capillary: 170 mg/dL — ABNORMAL HIGH (ref 65–99)
Glucose-Capillary: 170 mg/dL — ABNORMAL HIGH (ref 65–99)
Glucose-Capillary: 181 mg/dL — ABNORMAL HIGH (ref 65–99)
Glucose-Capillary: 193 mg/dL — ABNORMAL HIGH (ref 65–99)
Glucose-Capillary: 223 mg/dL — ABNORMAL HIGH (ref 65–99)
Glucose-Capillary: 242 mg/dL — ABNORMAL HIGH (ref 65–99)

## 2016-11-20 LAB — POCT I-STAT, CHEM 8
BUN: 18 mg/dL (ref 6–20)
Calcium, Ion: 1.19 mmol/L (ref 1.15–1.40)
Chloride: 98 mmol/L — ABNORMAL LOW (ref 101–111)
Creatinine, Ser: 0.8 mg/dL (ref 0.61–1.24)
Glucose, Bld: 159 mg/dL — ABNORMAL HIGH (ref 65–99)
HCT: 30 % — ABNORMAL LOW (ref 39.0–52.0)
Hemoglobin: 10.2 g/dL — ABNORMAL LOW (ref 13.0–17.0)
Potassium: 4 mmol/L (ref 3.5–5.1)
Sodium: 138 mmol/L (ref 135–145)
TCO2: 26 mmol/L (ref 0–100)

## 2016-11-20 LAB — CBC
HCT: 32.1 % — ABNORMAL LOW (ref 39.0–52.0)
Hemoglobin: 10.5 g/dL — ABNORMAL LOW (ref 13.0–17.0)
MCH: 29.8 pg (ref 26.0–34.0)
MCHC: 32.7 g/dL (ref 30.0–36.0)
MCV: 91.2 fL (ref 78.0–100.0)
Platelets: 148 10*3/uL — ABNORMAL LOW (ref 150–400)
RBC: 3.52 MIL/uL — ABNORMAL LOW (ref 4.22–5.81)
RDW: 14.3 % (ref 11.5–15.5)
WBC: 11.2 10*3/uL — ABNORMAL HIGH (ref 4.0–10.5)

## 2016-11-20 MED ORDER — POTASSIUM CHLORIDE CRYS ER 20 MEQ PO TBCR
20.0000 meq | EXTENDED_RELEASE_TABLET | Freq: Every day | ORAL | Status: DC
  Administered 2016-11-20 – 2016-11-25 (×6): 20 meq via ORAL
  Filled 2016-11-20 (×6): qty 1

## 2016-11-20 MED ORDER — METOPROLOL TARTRATE 25 MG PO TABS
25.0000 mg | ORAL_TABLET | Freq: Two times a day (BID) | ORAL | Status: DC
  Administered 2016-11-20 – 2016-11-25 (×11): 25 mg via ORAL
  Filled 2016-11-20 (×14): qty 1

## 2016-11-20 MED ORDER — FUROSEMIDE 10 MG/ML IJ SOLN
40.0000 mg | Freq: Once | INTRAMUSCULAR | Status: AC
  Administered 2016-11-20: 40 mg via INTRAVENOUS
  Filled 2016-11-20: qty 4

## 2016-11-20 MED ORDER — METOPROLOL TARTRATE 25 MG/10 ML ORAL SUSPENSION: 12.5000 mg | Freq: Two times a day (BID) | ORAL | Status: DC

## 2016-11-20 NOTE — Progress Notes (Signed)
Inpatient Diabetes Program Recommendations  AACE/ADA: New Consensus Statement on Inpatient Glycemic Control (2015)  Target Ranges:  Prepandial:   less than 140 mg/dL      Peak postprandial:   less than 180 mg/dL (1-2 hours)      Critically ill patients:  140 - 180 mg/dL   Lab Results  Component Value Date   GLUCAP 170 (H) 11/20/2016   HGBA1C 12.0 (H) 11/13/2016    Review of Glycemic Control Results for [name] [name], [name] [name] (MRN [id number]) as of 11/20/2016 07:34  Ref. Range 11/19/2016 11:39 11/19/2016 15:44 11/19/2016 19:30 11/19/2016 23:53 11/20/2016 04:04  Glucose-Capillary Latest Ref Range: 65 - 99 mg/dL 409240 (H) 811232 (H) 914232 (H) 170 (H) 170 (H)   Inpatient Diabetes Program Recommendations: Noted postprandial CBGs elevated.Please consider: -Novolog 4 units meal coverage tid if eats 50%  Thank you, Darel HongJudy E. Patterson Hollenbaugh, RN, MSN, CDE  Diabetes Coordinator Inpatient Glycemic Control Team Team Pager [phone] (8am-5pm) 11/20/2016 7:36 AM

## 2016-11-20 NOTE — Discharge Instructions (Signed)
Coronary Artery Bypass Grafting, Care After °This sheet gives you information about how to care for yourself after your procedure. Your health care provider may also give you more specific instructions. If you have problems or questions, contact your health care provider. °What can I expect after the procedure? °After the procedure, it is common to have: °· Nausea and a lack of appetite. °· Constipation. °· Weakness and fatigue. °· Depression or irritability. °· Pain or discomfort in your incision areas. ° °Follow these instructions at home: °Medicines °· Take over-the-counter and prescription medicines only as told by your health care provider. Do not stop taking medicines or start any new medicines without approval from your health care provider. °· If you were prescribed an antibiotic medicine, take it as told by your health care provider. Do not stop taking the antibiotic even if you start to feel better. °· Do not drive or use heavy machinery while taking prescription pain medicine. °Incision care °· Follow instructions from your health care provider about how to take care of your incisions. Make sure you: °? Wash your hands with soap and water before you change your bandage (dressing). If soap and water are not available, use hand sanitizer. °? Change your dressing as told by your health care provider. °? Leave stitches (sutures), skin glue, or adhesive strips in place. These skin closures may need to stay in place for 2 weeks or longer. If adhesive strip edges start to loosen and curl up, you may trim the loose edges. Do not remove adhesive strips completely unless your health care provider tells you to do that. °· Keep incision areas clean, dry, and protected. °· Check your incision areas every day for signs of infection. Check for: °? More redness, swelling, or pain. °? More fluid or blood. °? Warmth. °? Pus or a bad smell. °· If incisions were made in your legs: °? Avoid crossing your legs. °? Avoid  sitting for long periods of time. Change positions every 30 minutes. °? Raise (elevate) your legs when you are sitting. °Bathing °· Do not take baths, swim, or use a hot tub until your health care provider approves. °· Only take sponge baths. Pat the incisions dry. Do not rub incisions with a washcloth or towel. °· Ask your health care provider when you can shower. °Eating and drinking °· Eat foods that are high in fiber, such as raw fruits and vegetables, whole grains, beans, and nuts. Meats should be lean cut. Avoid canned, processed, and fried foods. This can help prevent constipation and is a recommended part of a heart-healthy diet. °· Drink enough fluid to keep your urine clear or pale yellow. °· Limit alcohol intake to no more than 1 drink a day for nonpregnant women and 2 drinks a day for men. One drink equals 12 oz of beer, 5 oz of wine, or 1½ oz of hard liquor. °Activity °· Rest and limit your activity as told by your health care provider. You may be instructed to: °? Stop any activity right away if you have chest pain, shortness of breath, irregular heartbeats, or dizziness. Get help right away if you have any of these symptoms. °? Move around frequently for short periods or take short walks as directed by your health care provider. Gradually increase your activities. You may need physical therapy or cardiac rehabilitation to help strengthen your muscles and build your endurance. °? Avoid lifting, pushing, or pulling anything that is heavier than 10 lb (4.5 kg) for at   least 6 weeks or as told by your health care provider. °· Do not drive until your health care provider approves. °· Ask your health care provider when you may return to work. °· Ask your health care provider when you may resume sexual activity. °General instructions °· Do not use any products that contain nicotine or tobacco, such as cigarettes and e-cigarettes. If you need help quitting, ask your health care provider. °· Take 2-3 deep  breaths every few hours during the day, while you recover. This helps expand your lungs and prevent complications like pneumonia after surgery. °· If you were given a device called an incentive spirometer, use it several times a day to practice deep breathing. Support your chest with a pillow or your arms when you take deep breaths or cough. °· Wear compression stockings as told by your health care provider. These stockings help to prevent blood clots and reduce swelling in your legs. °· Weigh yourself every day. This helps identify if your body is holding (retaining) fluid that may make your heart and lungs work harder. °· Keep all follow-up visits as told by your health care provider. This is important. °Contact a health care provider if: °· You have more redness, swelling, or pain around any incision. °· You have more fluid or blood coming from any incision. °· Any incision feels warm to the touch. °· You have pus or a bad smell coming from any incision °· You have a fever. °· You have swelling in your ankles or legs. °· You have pain in your legs. °· You gain 2 lb (0.9 kg) or more a day. °· You are nauseous or you vomit. °· You have diarrhea. °Get help right away if: °· You have chest pain that spreads to your jaw or arms. °· You are short of breath. °· You have a fast or irregular heartbeat. °· You notice a "clicking" in your breastbone (sternum) when you move. °· You have numbness or weakness in your arms or legs. °· You feel dizzy or light-headed. °Summary °· After the procedure, it is common to have pain or discomfort in the incision areas. °· Do not take baths, swim, or use a hot tub until your health care provider approves. °· Gradually increase your activities. You may need physical therapy or cardiac rehabilitation to help strengthen your muscles and build your endurance. °· Weigh yourself every day. This helps identify if your body is holding (retaining) fluid that may make your heart and lungs work  harder. °This information is not intended to replace advice given to you by your health care provider. Make sure you discuss any questions you have with your health care provider. °Document Released: 11/14/2004 Document Revised: 03/16/2016 Document Reviewed: 03/16/2016 °Elsevier Interactive Patient Education © 2018 [employer] ° ° °Endoscopic Saphenous Vein Harvesting, Care After °Refer to this sheet in the next few weeks. These instructions provide you with information about caring for yourself after your procedure. Your health care provider may also give you more specific instructions. Your treatment has been planned according to current medical practices, but problems sometimes occur. Call your health care provider if you have any problems or questions after your procedure. °What can I expect after the procedure? °After the procedure, it is common to have: °· Pain. °· Bruising. °· Swelling. °· Numbness. ° °Follow these instructions at home: °Medicine °· Take over-the-counter and prescription medicines only as told by your health care provider. °· Do not drive or operate heavy machinery   while taking prescription pain medicine. °Incision care ° °· Follow instructions from your health care provider about how to take care of the cut made during surgery (incision). Make sure you: °? Wash your hands with soap and water before you change your bandage (dressing). If soap and water are not available, use hand sanitizer. °? Change your dressing as told by your health care provider. °? Leave stitches (sutures), skin glue, or adhesive strips in place. These skin closures may need to be in place for 2 weeks or longer. If adhesive strip edges start to loosen and curl up, you may trim the loose edges. Do not remove adhesive strips completely unless your health care provider tells you to do that. °· Check your incision area every day for signs of infection. Check for: °? More redness, swelling, or pain. °? More fluid or  blood. °? Warmth. °? Pus or a bad smell. °General instructions °· Raise (elevate) your legs above the level of your heart while you are sitting or lying down. °· Do any exercises your health care providers have given you. These may include deep breathing, coughing, and walking exercises. °· Do not shower, take baths, swim, or use a hot tub unless told by your health care provider. °· Wear your elastic stocking if told by your health care provider. °· Keep all follow-up visits as told by your health care provider. This is important. °Contact a health care provider if: °· Medicine does not help your pain. °· Your pain gets worse. °· You have new leg bruises or your leg bruises get bigger. °· You have a fever. °· Your leg feels numb. °· You have more redness, swelling, or pain around your incision. °· You have more fluid or blood coming from your incision. °· Your incision feels warm to the touch. °· You have pus or a bad smell coming from your incision. °Get help right away if: °· Your pain is severe. °· You develop pain, tenderness, warmth, redness, or swelling in any part of your leg. °· You have chest pain. °· You have trouble breathing. °This information is not intended to replace advice given to you by your health care provider. Make sure you discuss any questions you have with your health care provider. °Document Released: 01/07/2011 Document Revised: 10/03/2015 Document Reviewed: 03/11/2015 °Elsevier Interactive Patient Education © 2018 [employer] ° ° °

## 2016-11-20 NOTE — Progress Notes (Addendum)
[facility] DAILY ICU PROGRESS NOTE                   [address]            [place] [id number]          [phone]   3 Days Post-Op Procedure(s) (LRB): CORONARY ARTERY BYPASS GRAFTING (CABG) x 5, USING RIGHT INTERNAL MAMMARY ARTERY AND RIGHT GREATER SAPHENOUS VEIN HARVESTED ENDOSCOPICALLY (N/A) TRANSESOPHAGEAL ECHOCARDIOGRAM (TEE) (N/A)  Total Length of Stay:  LOS: 7 days   Subjective: Patient with  sternal incisional pain, maybe slightly less. He is passing flatus but no bowel movement yet.  Objective: Vital signs in last 24 hours: Temp:  [97.7 F (36.5 C)-98.7 F (37.1 C)] 97.7 F (36.5 C) (07/13 0400) Pulse Rate:  [73-141] 83 (07/13 0600) Cardiac Rhythm: Normal sinus rhythm (07/13 0400) Resp:  [10-26] 25 (07/13 0700) BP: (87-150)/(55-83) 122/78 (07/13 0600) SpO2:  [91 %-97 %] 94 % (07/13 0600) Arterial Line BP: (84-115)/(48-54) 96/49 (07/12 1100) Weight:  [101 kg (222 lb 10.6 oz)] 101 kg (222 lb 10.6 oz) (07/13 0600)  Filed Weights   11/18/16 0500 11/19/16 0500 11/20/16 0600  Weight: 102.2 kg (225 lb 5 oz) 101.6 kg (224 lb) 101 kg (222 lb 10.6 oz)    Weight change: -0.606 kg (-1 lb 5.4 oz)       Intake/Output from previous day: 07/12 0701 - 07/13 0700 In: 665.1 [P.O.:120; I.V.:445.1; IV Piggyback:100] Out: 1925 [Urine:1925]  Intake/Output this shift: No intake/output data recorded.  Current Meds: Scheduled Meds: . acetaminophen  1,000 mg Oral Q6H   Or  . acetaminophen (TYLENOL) oral liquid 160 mg/5 mL  1,000 mg Per Tube Q6H  . amiodarone  400 mg Oral Q12H   Followed by  . [START ON 11/26/2016] amiodarone  400 mg Oral Daily  . aspirin EC  325 mg Oral Daily   Or  . aspirin  324 mg Per Tube Daily  . atorvastatin  80 mg Oral q1800  . bisacodyl  10 mg Oral Daily   Or  . bisacodyl  10 mg Rectal Daily  . Chlorhexidine Gluconate Cloth  6 each Topical Daily  . clotrimazole   Topical BID  . docusate sodium  200 mg Oral Daily  . fentaNYL  25 mcg  Transdermal Q72H  . gabapentin  600 mg Oral TID  . insulin aspart  0-24 Units Subcutaneous Q4H  . insulin glargine  20 Units Subcutaneous BID  . lidocaine  1 patch Transdermal Q24H  . metoCLOPramide (REGLAN) injection  10 mg Intravenous Q6H  . metoprolol tartrate  12.5 mg Oral BID   Or  . metoprolol tartrate  12.5 mg Per Tube BID  . pantoprazole  40 mg Oral Daily  . sodium chloride flush  3 mL Intravenous Q12H  . traZODone  100 mg Oral QHS   Continuous Infusions: . sodium chloride 20 mL/hr at 11/17/16 2000  . sodium chloride    . sodium chloride 10 mL/hr at 11/17/16 2000  . lactated ringers 10 mL/hr at 11/19/16 2000  . lactated ringers 10 mL/hr at 11/17/16 1531  . milrinone 0.25 mcg/kg/min (11/19/16 2000)  . nitroGLYCERIN    . phenylephrine (NEO-SYNEPHRINE) Adult infusion Stopped (11/18/16 0530)   PRN Meds:.sodium chloride, guaiFENesin-dextromethorphan, metoprolol tartrate, midazolam, morphine injection, ondansetron (ZOFRAN) IV, oxyCODONE, sodium chloride flush, traMADol  General appearance: alert, cooperative and no distress Neurologic: intact Heart: RRR Lungs: Slightly diminshed at bases Abdomen: Soft, non tender, slight distention,  bowel sounds present Extremities: Bilateral LE edema Wound: Aquacel removed and sternal wound  is clean and dry. RLE wounds are clean and dry.  Lab Results: CBC:  Recent Labs  11/19/16 0330 11/20/16 0015 11/20/16 0400  WBC 9.3  --  11.2*  HGB 10.6* 10.2* 10.5*  HCT 31.0* 30.0* 32.1*  PLT 111*  --  148*   BMET:   Recent Labs  11/19/16 0330 11/20/16 0015 11/20/16 0400  NA 134* 138 135  K 3.9 4.0 4.2  CL 106 98* 103  CO2 23  --  26  GLUCOSE 191* 159* 173*  BUN 13 18 18   CREATININE 0.78 0.80 0.83  CALCIUM 7.7*  --  8.1*    CMET: Lab Results  Component Value Date   WBC 11.2 (H) 11/20/2016   HGB 10.5 (L) 11/20/2016   HCT 32.1 (L) 11/20/2016   PLT 148 (L) 11/20/2016   GLUCOSE 173 (H) 11/20/2016   CHOL 111 11/13/2016   TRIG  244 (H) 11/13/2016   HDL 26 (L) 11/13/2016   LDLCALC 36 11/13/2016   ALT 59 11/18/2016   AST 52 (H) 11/18/2016   NA 135 11/20/2016   K 4.2 11/20/2016   CL 103 11/20/2016   CREATININE 0.83 11/20/2016   BUN 18 11/20/2016   CO2 26 11/20/2016   TSH 4.353 11/18/2016   INR 1.07 11/13/2016   HGBA1C 12.0 (H) 11/13/2016   MICROALBUR 2.6 08/06/2015      PT/INR: No results for input(s): LABPROT, INR in the last 72 hours. Radiology: No results found.  Assessment/Plan: S/P Procedure(s) (LRB): CORONARY ARTERY BYPASS GRAFTING (CABG) x 5, USING RIGHT INTERNAL MAMMARY ARTERY AND RIGHT GREATER SAPHENOUS VEIN HARVESTED ENDOSCOPICALLY (N/A) TRANSESOPHAGEAL ECHOCARDIOGRAM (TEE) (N/A)  1.CV-Went into a fib with RVR yesterday. SR in the low 90's this am. On Milrinone  Drip, Amiodarone 400 mg bid, and Lopressor 12.5 mg bid. Will increase Lopressor to 25 mg bid. Hopefully, will remain in SR so will not need anticoagulation. Co ox decreased to 53.5. Will discuss management of Milrinone drip with surgeon.  2.Pulmonary-Chest tubes removed yesterday. On 3 liters of oxygen via [place]. Will wean as able over the next few days. CXR this am appears to show low lung volumes, patient rotated to the right, cardiomegaly, no pneumothorax, bibasilar atelectasis and pleural effusions. Encourage incentive spirometer and flutter valve. 3.Volume overload-will give Lasix 40 mg IV again this am 4.ABL anemia-H and H decreased to 10.2 and 32.1 5.DM-CBGs 130/184/170. On Insulin drip. Continue Insulin. Pre op HGA1C 12. Will need close medical follow up after discharge. 6. Thrombocytopenia-platelets increased to 148,000 7. Regarding incisional pain, continue Duragesic patch. Will use while in hospital but he will be discharged with oral narcotic only. 8. Likely be able to transfer to 4E  ZIMMERMAN,DONIELLE M PA-C 11/20/2016 7:17 AM   Still on milrinone , cox down 53 so have not weaned  Plan transfer tomorrow of off milrinone  I  have seen and examined Kathleen Argue and agree with the above assessment  and plan.  Delight Ovens MD Beeper [phone] Office [phone] 11/20/2016 2:08 PM

## 2016-11-20 NOTE — Progress Notes (Signed)
Physician Discharge Summary  Patient ID: [name] [name] MRN: [id number] DOB/AGE: 57/19/1962 57 y.o.  Admit date: 11/12/2016 Discharge date: 11/25/2016  Admission Diagnoses: 1. Unstable angina ([place]) 2.Coronary artery disease-s/p MI and PCI with stents  Active Diagnoses:  Hyperlipidemia associated with type 2 diabetes mellitus (HCC)  Spondylolisthesis at L5-S1 level  Rhinitis, chronic  Erectile dysfunction  Left breast mass  TMJ (temporomandibular joint syndrome)  Right shoulder pain  DM type 2, uncontrolled, with neuropathy (HCC)  CAD (coronary artery disease) of artery bypass graft  Chronic low back pain  Fracture of great toe, left, closed  Hypertension associated with diabetes (HCC)  Depression  Anxiety  OCD (obsessive compulsive disorder)  ABL anemia Paroxysmal atrial fibrillation  Discharged Condition: Stable and discharged to home  History of Present Illness:  [name] [name] is a 56 yo male with known history of CAD.  He is S/P overlapping cypher DES to the RCA in 2005.  He also has a history of DM, sleep apnea, hypertension, hyperlipidemia.  He presented with complaints of chest pain.  He was in his usual state of health until yesterday when he developed indigestion and took antacids with relief.  However he developed similar symptoms which did not relieve the second time with antacids.  This progressed to left sided sharp pain with radiation to his mid chest.  It became more severe and radiated to his shoulder and back.  There was associated nausea, but no dyspnea, palpitations or diaphoresis.  Workup in the ED revealed negative troponin levels.  His chest pain resolved with IV nitroglycerin.  Cardiology consult was obtained who felt patient would require cardiac catheterization.  He was admitted for further care.  Hospital Course:   [name] [name] remained chest pain free.  Cardiac catheterization showed multivessel CAD.  It was felt coronary bypass grafting would be indicated and  TCTS consult was obtained.  He was evaluated by Dr. Prescott Gum who was in agreement the patient would require coronary bypass grafting procedure.  The risks and benefits of the procedure were explained to the patient and she was agreeable to proceed.  He was taken to the operating room on 11/17/2016.  He underwent CABG x 4 utilizing LIMA to LAD, SVG to Ramus Intermediate, SVG to OM, and Sequential SVG to PDA and PLB.  He also underwent endoscopic harvest of greater saphenous vein from right leg.  He tolerated the procedure without difficulty and was taken to the SICU in stable condition.  He was extubated the evening of surgery.  During his stay in the SICU, the patient was weaned off Neo-synephrine, Dopamine, and Milrinone drips as tolerated.  He was weaned off the Insulin drip and restarted on scheduled Insulin. His pre op HGA1C 12 and he will need close follow up with his medical doctor after discharge. The patient developed rapid Atrial Fibrillation and was treated with IV Amiodarone. He has had PAF and ultimately required anticoagulation.  It appears his insurance will not cover a NOAC;however, he was given a 30 day free supply of Xarelto (as discussed with both Dr. Sallyanne Kuster and Dr. Prescott Gum). Hopefully, he will only need anticoagulation for 2-3 months.  I did decrease his ecasa to 81 mg daily and his Amiodarone to 200 mg bid fo rone week then 200 mg daily thereafter. His chest tubes and arterial lines were removed on POD #2.  He was aggressively diuresed with IV Lasix. He had ABL anemia and H and H up to 11.5 and 34.8. He has  been having increased post operative pain and required duragesic patch for ultimate relief.  He will not be discharged on a patch, only Oxycodone. He is ambulating in the SICU and felt medically stable for transfer to the step down unit on 11/23/2016.  His epicardial pacing wires were removed on 11/24/2016. He is ambulating on room air. He is tolerating a diet and has had a bowel movement.  His incisions are clean and dry. Chest tube sutures will be removed today. He is felt surgically stable for discharge today.  Significant Diagnostic Studies: angiography:    Ost RCA to Dist RCA lesion, 80 %stenosed.  Mid RCA to Dist RCA lesion, 100 %stenosed.  Prox LAD to Mid LAD lesion, 75 %stenosed.  Mid LAD to Dist LAD lesion, 75 %stenosed.  Ost 1st Mrg to 1st Mrg lesion, 50 %stenosed.  2nd Mrg lesion, 99 %stenosed.  Ost Ramus to Ramus lesion, 75 %stenosed.  Ost 1st Diag to 1st Diag lesion, 80 %stenosed.  The left ventricular systolic function is normal.  LV end diastolic pressure is normal.  The left ventricular ejection fraction is 55-65% by visual estimate.   CLINICAL DATA:  CABG.  EXAM: CHEST  2 VIEW  COMPARISON:  11/23/2016 .  11/12/2016.  04/27/2015 .  FINDINGS: Prior CABG. Stable cardiomegaly. Low lung volumes with mild subsegmental atelectasis right upper lobe and both lung bases. Small right pleural effusion. No pneumothorax .  IMPRESSION: 1. Prior CABG.  Stable cardiomegaly.  2. Low lung volumes with mild subsegmental atelectasis right upper lobe and both lung bases. Small right pleural effusion.   Electronically Signed   By: Marcello Moores  Register   On: 11/24/2016 07:37  Treatments: surgery:   MEDIAN STERNOTOMY for CORONARY ARTERY BYPASS GRAFTING (CABG) x 4, (LIMA to LAD, SVG to RAMUS INTERMEDIATE, SVG to OM, SVG SEQUENTIALLY to PDA and PLB) USING RIGHT INTERNAL MAMMARY ARTERY AND RIGHT GREATER SAPHENOUS VEIN HARVESTED ENDOSCOPICALLY    Discharge Medications:  The patient has been discharged on:   1.Beta Blocker:  Yes [ x  ]                              No   [   ]                              If No, reason:  2.Ace Inhibitor/ARB: Yes [   ]                                     No  [  x  ]                                     If No, reason:Labile BP;hope to start at discharge  3.Statin:   Yes [ x  ]                  No  [   ]                   If No, reason:  4.Ecasa:  Yes  [x   ]                  No   [   ]  If No, reason:     Discharge Instructions    AMB Referral to Cardiac Rehabilitation - Phase II    Complete by:  As directed    Diagnosis:  Stable Angina     Allergies as of 11/25/2016      Reactions   No Known Allergies       Medication List    STOP taking these medications   lisinopril 40 MG tablet Commonly known as:  PRINIVIL,ZESTRIL   predniSONE 10 MG tablet Commonly known as:  DELTASONE     TAKE these medications   ACCU-CHEK FASTCLIX LANCETS Misc 1 each by Does not apply route 3 (three) times daily.   TRUEPLUS LANCETS 28G Misc 1 each by Does not apply route 3 (three) times daily.   acetaminophen-codeine 300-60 MG tablet Commonly known as:  TYLENOL #4 Take 1 tablet by mouth every 12 (twelve) hours as needed for moderate pain. What changed:  when to take this  reasons to take this   amiodarone 200 MG tablet Commonly known as:  PACERONE Take 1 tablet (200 mg total) by mouth every 12 (twelve) hours. For one week;then take Amiodarone 200 mg daily thereafter.   aspirin EC 81 MG tablet Take 81 mg by mouth daily. Reported on 08/06/2015   atorvastatin 80 MG tablet Commonly known as:  LIPITOR Take 1 tablet (80 mg total) by mouth daily at 6 PM. What changed:  medication strength  how much to take  when to take this   clotrimazole 1 % cream Commonly known as:  LOTRIMIN Apply topically 2 (two) times daily.   gabapentin 300 MG capsule Commonly known as:  NEURONTIN TAKE 2 CAPSULES BY MOUTH 3 TIMES DAILY FOR LOW BACK PAIN AND NEUROPATHY   glucose blood test strip Commonly known as:  TRUE METRIX BLOOD GLUCOSE TEST 1 each by Other route 3 (three) times daily.   insulin aspart 100 UNIT/ML FlexPen Commonly known as:  NOVOLOG Inject 8 Units into the skin 3 (three) times daily with meals. What changed:  how much to take   Insulin Glargine 300 UNIT/ML Sopn Commonly  known as:  TOUJEO SOLOSTAR Inject 48 Units into the skin every morning. What changed:  how much to take   Insulin Pen Needle 32G X 4 MM Misc Commonly known as:  INSUPEN PEN NEEDLES 1 each by Does not apply route daily.   metoprolol tartrate 25 MG tablet Commonly known as:  LOPRESSOR Take 1 tablet (25 mg total) by mouth 2 (two) times daily.   oxyCODONE 5 MG immediate release tablet Commonly known as:  Oxy IR/ROXICODONE Take 5 mg by mouth every 4-6 hours PRN severe pain.   rivaroxaban 20 MG Tabs tablet Commonly known as:  XARELTO Take 1 tablet (20 mg total) by mouth daily with supper.   traZODone 50 MG tablet Commonly known as:  DESYREL Take 2 tablets (100 mg total) by mouth at bedtime.   TRUE METRIX METER w/Device Kit 1 each by Does not apply route as needed.      Follow-up Information    Leonard Downing, MD Follow up.   Specialty:  Family Medicine Why:  Call for a follow up appointment regarding further management of diabetes and surveillance [facility] Contact information: [place] [address] [phone]        Ivin Poot, MD Follow up on 12/30/2016.   Specialty:  Cardiothoracic Surgery Why:  PA/LAT CXR to be taken (at [facility] which is in the  same building as Dr. Lucianne Lei Trigt's office on the ground floor) on 12/30/2016 at 1:00;Appointment time is at 1:30 pm Contact information: [place] [address] [phone]        Almyra Deforest, [place] Follow up on 12/09/2016.   Specialties:  Cardiology, Radiology Why:  Appointment time is at 9:30 am Contact information: [address] [place] [address] [phone]           Signed: Nani Skillern PA-C 11/25/2016, 10:04 AM

## 2016-11-20 NOTE — Progress Notes (Signed)
TCTS BRIEF SICU PROGRESS NOTE  3 Days Post-Op  S/P Procedure(s) (LRB): CORONARY ARTERY BYPASS GRAFTING (CABG) x 5, USING RIGHT INTERNAL MAMMARY ARTERY AND RIGHT GREATER SAPHENOUS VEIN HARVESTED ENDOSCOPICALLY (N/A) TRANSESOPHAGEAL ECHOCARDIOGRAM (TEE) (N/A)   Stable day Maintaining NSR  Breathing comfortably w/ O2 sats 91-94% on 1 L/min UOP adequate  Plan: Continue current plan  Purcell Nailslarence H Correy Weidner, MD 11/20/2016 7:36 PM

## 2016-11-20 NOTE — Progress Notes (Signed)
Pt spontaneously went into sinus tach up to the 150s. EKG done, PRN metoprolol given, with no change, PRN morphine given and pt returned to NSR 70-80 and is now resting comfortably. Will continue to monitor.

## 2016-11-21 DIAGNOSIS — I2510 Atherosclerotic heart disease of native coronary artery without angina pectoris: Secondary | ICD-10-CM

## 2016-11-21 DIAGNOSIS — I480 Paroxysmal atrial fibrillation: Secondary | ICD-10-CM

## 2016-11-21 DIAGNOSIS — R0789 Other chest pain: Secondary | ICD-10-CM

## 2016-11-21 LAB — BASIC METABOLIC PANEL
Anion gap: 6 (ref 5–15)
BUN: 18 mg/dL (ref 6–20)
CO2: 28 mmol/L (ref 22–32)
Calcium: 8.3 mg/dL — ABNORMAL LOW (ref 8.9–10.3)
Chloride: 102 mmol/L (ref 101–111)
Creatinine, Ser: 0.81 mg/dL (ref 0.61–1.24)
GFR calc Af Amer: >60 mL/min (ref >60)
GFR calc non Af Amer: >60 mL/min (ref >60)
Glucose, Bld: 152 mg/dL — ABNORMAL HIGH (ref 65–99)
Potassium: 3.9 mmol/L (ref 3.5–5.1)
Sodium: 136 mmol/L (ref 135–145)

## 2016-11-21 LAB — GLUCOSE, CAPILLARY
Glucose-Capillary: 146 mg/dL — ABNORMAL HIGH (ref 65–99)
Glucose-Capillary: 180 mg/dL — ABNORMAL HIGH (ref 65–99)
Glucose-Capillary: 163 mg/dL — ABNORMAL HIGH (ref 65–99)
Glucose-Capillary: 208 mg/dL — ABNORMAL HIGH (ref 65–99)
Glucose-Capillary: 164 mg/dL — ABNORMAL HIGH (ref 65–99)

## 2016-11-21 LAB — COOXEMETRY PANEL
Carboxyhemoglobin: 1.6 % — ABNORMAL HIGH (ref 0.5–1.5)
Methemoglobin: 1.2 % (ref 0.0–1.5)
O2 Saturation: 71.4 %
Total hemoglobin: 10.4 g/dL — ABNORMAL LOW (ref 12.0–16.0)

## 2016-11-21 NOTE — Progress Notes (Signed)
Patient has been going in and out of atrial tachycardia/afibb. Rhythm last less than a minute. Patient is asymptomatic. Metropolol IV given for first occurrence this shift but patient converted spontaneously to NSR with subsequent affib events. Will continue to monitor

## 2016-11-21 NOTE — Progress Notes (Signed)
Patient ID: [name] [name], male   DOB: 05/08/1961, 56 y.o.   MRN: [id number] TCTS DAILY ICU PROGRESS NOTE                   [address]            [place] [address]          [phone]   4 Days Post-Op Procedure(s) (LRB): CORONARY ARTERY BYPASS GRAFTING (CABG) x 5, USING RIGHT INTERNAL MAMMARY ARTERY AND RIGHT GREATER SAPHENOUS VEIN HARVESTED ENDOSCOPICALLY (N/A) TRANSESOPHAGEAL ECHOCARDIOGRAM (TEE) (N/A)   Total Length of Stay:  LOS: 8 days   Subjective: Brief afib last pm   Objective: Vital signs in last 24 hours: Temp:  [97.6 F (36.4 C)-99 F (37.2 C)] 97.7 F (36.5 C) (07/14 0814) Pulse Rate:  [39-155] 84 (07/14 0737) Cardiac Rhythm: Normal sinus rhythm (07/14 0737) Resp:  [6-25] 15 (07/14 0737) BP: (77-158)/(48-137) 126/69 (07/14 0737) SpO2:  [86 %-97 %] 96 % (07/14 0737) Weight:  [218 lb 4.1 oz (99 kg)] 218 lb 4.1 oz (99 kg) (07/14 0405)  Filed Weights   11/19/16 0500 11/20/16 0600 11/21/16 0405  Weight: 224 lb (101.6 kg) 222 lb 10.6 oz (101 kg) 218 lb 4.1 oz (99 kg)    Weight change: -4 lb 6.5 oz (-2 kg)   Hemodynamic parameters for last 24 hours:    Intake/Output from previous day: 07/13 0701 - 07/14 0700 In: 297.5 [I.V.:297.5] Out: 1400 [Urine:1400]  Intake/Output this shift: No intake/output data recorded.  Current Meds: Scheduled Meds: . acetaminophen  1,000 mg Oral Q6H   Or  . acetaminophen (TYLENOL) oral liquid 160 mg/5 mL  1,000 mg Per Tube Q6H  . amiodarone  400 mg Oral Q12H   Followed by  . [START ON 11/26/2016] amiodarone  400 mg Oral Daily  . aspirin EC  325 mg Oral Daily   Or  . aspirin  324 mg Per Tube Daily  . atorvastatin  80 mg Oral q1800  . bisacodyl  10 mg Oral Daily   Or  . bisacodyl  10 mg Rectal Daily  . Chlorhexidine Gluconate Cloth  6 each Topical Daily  . clotrimazole   Topical BID  . docusate sodium  200 mg Oral Daily  . fentaNYL  25 mcg Transdermal Q72H  . gabapentin  600 mg Oral TID  . insulin  aspart  0-24 Units Subcutaneous Q4H  . insulin glargine  20 Units Subcutaneous BID  . lidocaine  1 patch Transdermal Q24H  . metoCLOPramide (REGLAN) injection  10 mg Intravenous Q6H  . metoprolol tartrate  25 mg Oral BID   Or  . metoprolol tartrate  12.5 mg Per Tube BID  . pantoprazole  40 mg Oral Daily  . potassium chloride  20 mEq Oral Daily  . sodium chloride flush  3 mL Intravenous Q12H  . traZODone  100 mg Oral QHS   Continuous Infusions: . sodium chloride 20 mL/hr at 11/17/16 2000  . sodium chloride    . sodium chloride 10 mL/hr at 11/17/16 2000  . lactated ringers 10 mL/hr at 11/19/16 2000  . lactated ringers 10 mL/hr at 11/17/16 1531  . milrinone 0.25 mcg/kg/min (11/20/16 1854)  . nitroGLYCERIN    . phenylephrine (NEO-SYNEPHRINE) Adult infusion Stopped (11/18/16 0530)   PRN Meds:.sodium chloride, guaiFENesin-dextromethorphan, metoprolol tartrate, midazolam, morphine injection, ondansetron (ZOFRAN) IV, oxyCODONE, sodium chloride flush, traMADol  General appearance: alert and cooperative Neurologic: intact Heart: regular rate and rhythm, S1, S2  normal, no murmur, click, rub or gallop Lungs: diminished breath sounds bibasilar Abdomen: soft, non-tender; bowel sounds normal; no masses,  no organomegaly Extremities: extremities normal, atraumatic, no cyanosis or edema and Homans sign is negative, no sign of DVT Wound: sternum inatct  Lab Results: CBC: Recent Labs  11/19/16 0330 11/20/16 0015 11/20/16 0400  WBC 9.3  --  11.2*  HGB 10.6* 10.2* 10.5*  HCT 31.0* 30.0* 32.1*  PLT 111*  --  148*   BMET:  Recent Labs  11/20/16 0400 11/21/16 0444  NA 135 136  K 4.2 3.9  CL 103 102  CO2 26 28  GLUCOSE 173* 152*  BUN 18 18  CREATININE 0.83 0.81  CALCIUM 8.1* 8.3*    CMET: Lab Results  Component Value Date   WBC 11.2 (H) 11/20/2016   HGB 10.5 (L) 11/20/2016   HCT 32.1 (L) 11/20/2016   PLT 148 (L) 11/20/2016   GLUCOSE 152 (H) 11/21/2016   CHOL 111 11/13/2016    TRIG 244 (H) 11/13/2016   HDL 26 (L) 11/13/2016   LDLCALC 36 11/13/2016   ALT 59 11/18/2016   AST 52 (H) 11/18/2016   NA 136 11/21/2016   K 3.9 11/21/2016   CL 102 11/21/2016   CREATININE 0.81 11/21/2016   BUN 18 11/21/2016   CO2 28 11/21/2016   TSH 4.353 11/18/2016   INR 1.07 11/13/2016   HGBA1C 12.0 (H) 11/13/2016   MICROALBUR 2.6 08/06/2015   COX 71.4    PT/INR: No results for input(s): LABPROT, INR in the last 72 hours. Radiology: No results found.   Assessment/Plan: S/P Procedure(s) (LRB): CORONARY ARTERY BYPASS GRAFTING (CABG) x 5, USING RIGHT INTERNAL MAMMARY ARTERY AND RIGHT GREATER SAPHENOUS VEIN HARVESTED ENDOSCOPICALLY (N/A) TRANSESOPHAGEAL ECHOCARDIOGRAM (TEE) (N/A) Mobilize Diuresis Diabetes control On lopressor and Cordarone   for a fib On milrinone- will wean off today   To stepdown in am   Delight Ovensdward B Madisyn Mawhinney 11/21/2016 9:30 AM

## 2016-11-21 NOTE — Progress Notes (Signed)
Patient ID: [name] [name], male   DOB: 06/15/1960, 56 y.o.   MRN: [id number] EVENING ROUNDS NOTE :     [address]       [place] [id number]             [phone]                 4 Days Post-Op Procedure(s) (LRB): CORONARY ARTERY BYPASS GRAFTING (CABG) x 5, USING RIGHT INTERNAL MAMMARY ARTERY AND RIGHT GREATER SAPHENOUS VEIN HARVESTED ENDOSCOPICALLY (N/A) TRANSESOPHAGEAL ECHOCARDIOGRAM (TEE) (N/A)  Total Length of Stay:  LOS: 8 days  BP (!) 142/79   Pulse 86   Temp 97.6 F (36.4 C) (Oral)   Resp (!) 23   Ht 5\' 11"  (1.803 m)   Wt 218 lb 4.1 oz (99 kg)   SpO2 94%   BMI 30.44 kg/m   .Intake/Output      07/13 0701 - 07/14 0700 07/14 0701 - 07/15 0700   P.O.  120   I.V. (mL/kg) 297.5 (3) 48.3 (0.5)   Total Intake(mL/kg) 297.5 (3) 168.3 (1.7)   Urine (mL/kg/hr) 1400 (0.6) 600 (0.5)   Total Output 1400 600   Net -1102.5 -431.7        Urine Occurrence  1 x   Stool Occurrence  1 x     . sodium chloride 20 mL/hr at 11/17/16 2000  . sodium chloride    . sodium chloride 10 mL/hr at 11/17/16 2000  . lactated ringers 10 mL/hr at 11/19/16 2000  . lactated ringers 10 mL/hr at 11/17/16 1531  . milrinone 0.125 mcg/kg/min (11/21/16 1053)  . nitroGLYCERIN    . phenylephrine (NEO-SYNEPHRINE) Adult infusion Stopped (11/18/16 0530)     Lab Results  Component Value Date   WBC 11.2 (H) 11/20/2016   HGB 10.5 (L) 11/20/2016   HCT 32.1 (L) 11/20/2016   PLT 148 (L) 11/20/2016   GLUCOSE 152 (H) 11/21/2016   CHOL 111 11/13/2016   TRIG 244 (H) 11/13/2016   HDL 26 (L) 11/13/2016   LDLCALC 36 11/13/2016   ALT 59 11/18/2016   AST 52 (H) 11/18/2016   NA 136 11/21/2016   K 3.9 11/21/2016   CL 102 11/21/2016   CREATININE 0.81 11/21/2016   BUN 18 11/21/2016   CO2 28 11/21/2016   TSH 4.353 11/18/2016   INR 1.07 11/13/2016   HGBA1C 12.0 (H) 11/13/2016   MICROALBUR 2.6 08/06/2015  improving Weaning milrinone    Delight OvensEdward B Ardean Simonich MD  [facility]eeper [phone] Office  [phone] 11/21/2016 6:49 PM

## 2016-11-21 NOTE — Progress Notes (Signed)
Progress Note  Patient Name: [name] [name] Date of Encounter: 11/21/2016  Primary Cardiologist: Jens Som  Subjective   Still having some incisional pain. In and out of atrial fibrillation. Brief episodes noted.  Inpatient Medications    Scheduled Meds: . acetaminophen  1,000 mg Oral Q6H   Or  . acetaminophen (TYLENOL) oral liquid 160 mg/5 mL  1,000 mg Per Tube Q6H  . amiodarone  400 mg Oral Q12H   Followed by  . [START ON 11/26/2016] amiodarone  400 mg Oral Daily  . aspirin EC  325 mg Oral Daily   Or  . aspirin  324 mg Per Tube Daily  . atorvastatin  80 mg Oral q1800  . bisacodyl  10 mg Oral Daily   Or  . bisacodyl  10 mg Rectal Daily  . Chlorhexidine Gluconate Cloth  6 each Topical Daily  . clotrimazole   Topical BID  . docusate sodium  200 mg Oral Daily  . fentaNYL  25 mcg Transdermal Q72H  . gabapentin  600 mg Oral TID  . insulin aspart  0-24 Units Subcutaneous Q4H  . insulin glargine  20 Units Subcutaneous BID  . lidocaine  1 patch Transdermal Q24H  . metoCLOPramide (REGLAN) injection  10 mg Intravenous Q6H  . metoprolol tartrate  25 mg Oral BID   Or  . metoprolol tartrate  12.5 mg Per Tube BID  . pantoprazole  40 mg Oral Daily  . potassium chloride  20 mEq Oral Daily  . sodium chloride flush  3 mL Intravenous Q12H  . traZODone  100 mg Oral QHS   Continuous Infusions: . sodium chloride 20 mL/hr at 11/17/16 2000  . sodium chloride    . sodium chloride 10 mL/hr at 11/17/16 2000  . lactated ringers 10 mL/hr at 11/19/16 2000  . lactated ringers 10 mL/hr at 11/17/16 1531  . milrinone 0.25 mcg/kg/min (11/20/16 1854)  . nitroGLYCERIN    . phenylephrine (NEO-SYNEPHRINE) Adult infusion Stopped (11/18/16 0530)   PRN Meds: sodium chloride, guaiFENesin-dextromethorphan, metoprolol tartrate, midazolam, morphine injection, ondansetron (ZOFRAN) IV, oxyCODONE, sodium chloride flush, traMADol   Vital Signs    Vitals:   11/21/16 0600 11/21/16 0632 11/21/16 0737  11/21/16 0814  BP: (!) 106/59  126/69   Pulse: 80 87 84   Resp: 12 (!) 21 15   Temp:    97.7 F (36.5 C)  TempSrc:    Oral  SpO2: 95% 95% 96%   Weight:      Height:        Intake/Output Summary (Last 24 hours) at 11/21/16 0832 Last data filed at 11/21/16 0700  Gross per 24 hour  Intake            273.3 ml  Output             1400 ml  Net          -1126.7 ml   Filed Weights   11/19/16 0500 11/20/16 0600 11/21/16 0405  Weight: 224 lb (101.6 kg) 222 lb 10.6 oz (101 kg) 218 lb 4.1 oz (99 kg)    Telemetry    Normal sinus rhythm currently, brief episodes of atrial fibrillation noted previously. - Personally Reviewed  ECG    NSR on 11/18/16 - Personally Reviewed  Physical Exam   GEN: Well nourished, well developed, in no acute distress  HEENT: normal  Neck: no JVD, carotid bruits, or masses Cardiac: RRR; no murmurs, rubs, or gallops,no edema incision clean dry and intact. Respiratory:  clear to auscultation bilaterally, normal work of breathing GI: soft, nontender, nondistended, + BS MS: no deformity or atrophy  Skin: warm and dry, no rash Neuro:  Alert and Oriented x 3, Strength and sensation are intact Psych: euthymic mood, full affect   Labs    Chemistry Recent Labs Lab 11/18/16 1708  11/19/16 0330 11/20/16 0015 11/20/16 0400 11/21/16 0444  NA  --   < > 134* 138 135 136  K  --   < > 3.9 4.0 4.2 3.9  CL  --   < > 106 98* 103 102  CO2  --   --  23  --  26 28  GLUCOSE  --   < > 191* 159* 173* 152*  BUN  --   < > 13 18 18 18   CREATININE 0.84  < > 0.78 0.80 0.83 0.81  CALCIUM  --   --  7.7*  --  8.1* 8.3*  PROT 5.7*  --   --   --   --   --   ALBUMIN 3.1*  --   --   --   --   --   AST 52*  --   --   --   --   --   ALT 59  --   --   --   --   --   ALKPHOS 49  --   --   --   --   --   BILITOT 1.7*  --   --   --   --   --   GFRNONAA >60  --  >60  --  >60 >60  GFRAA >60  --  >60  --  >60 >60  ANIONGAP  --   --  5  --  6 6  < > = values in this interval not  displayed.   Hematology Recent Labs Lab 11/18/16 1708  11/19/16 0330 11/20/16 0015 11/20/16 0400  WBC 11.9*  --  9.3  --  11.2*  RBC 3.93*  --  3.43*  --  3.52*  HGB 11.8*  < > 10.6* 10.2* 10.5*  HCT 35.6*  < > 31.0* 30.0* 32.1*  MCV 90.6  --  90.4  --  91.2  MCH 30.0  --  30.9  --  29.8  MCHC 33.1  --  34.2  --  32.7  RDW 14.6  --  14.5  --  14.3  PLT 149*  --  111*  --  148*  < > = values in this interval not displayed.  Cardiac EnzymesNo results for input(s): TROPONINI in the last 168 hours. No results for input(s): TROPIPOC in the last 168 hours.   BNPNo results for input(s): BNP, PROBNP in the last 168 hours.   DDimer No results for input(s): DDIMER in the last 168 hours.   Radiology    Dg Chest Port 1 View  Result Date: 11/20/2016 CLINICAL DATA:  Chest tube present EXAM: PORTABLE CHEST 1 VIEW COMPARISON:  Yesterday FINDINGS: Left chest tube has been removed. No pneumothorax or new pleural effusion. Right IJ sheath remains. Stable cardiopericardial enlargement. Status post CABG. Unchanged interstitial coarsening attributed to pulmonary vascular congestion. There is low lung volumes with atelectasis greatest behind the heart. IMPRESSION: 1. No pneumothorax after chest tube removal. 2. Low volume chest with atelectasis.  Vascular congestion. Electronically Signed   By: Marnee SpringJonathon  Watts M.D.   On: 11/20/2016 08:32    Cardiac Studies   ECHO - EF 65%  Patient Profile     56 y.o. male with CAD post CABG with PAF on 7/11.   Assessment & Plan    PAF  - PO amio. Still having brief episodes of atrial fibrillation. Continue the amiodarone and upon discharge would decrease to 200 mg twice a day.  - Since he is having recurrent episodes, consider anticoagulation.  - On low-dose metoprolol.  CAD  - post cabg, pain is mostly incisional  Hypotension  - Improved. He is still on milrinone 0.25. This will be continued to be weaned. Last CoOx was 71. Improved.   Signed, Donato Schultz, MD  11/21/2016, 8:32 AM

## 2016-11-22 ENCOUNTER — Inpatient Hospital Stay (HOSPITAL_COMMUNITY): Payer: Medicare Other

## 2016-11-22 LAB — GLUCOSE, CAPILLARY
Glucose-Capillary: 94 mg/dL (ref 65–99)
Glucose-Capillary: 126 mg/dL — ABNORMAL HIGH (ref 65–99)
Glucose-Capillary: 122 mg/dL — ABNORMAL HIGH (ref 65–99)
Glucose-Capillary: 155 mg/dL — ABNORMAL HIGH (ref 65–99)
Glucose-Capillary: 154 mg/dL — ABNORMAL HIGH (ref 65–99)
Glucose-Capillary: 122 mg/dL — ABNORMAL HIGH (ref 65–99)

## 2016-11-22 LAB — COOXEMETRY PANEL
Carboxyhemoglobin: 1.5 % (ref 0.5–1.5)
Methemoglobin: 1.3 % (ref 0.0–1.5)
O2 Saturation: 57.3 %
Total hemoglobin: 10.3 g/dL — ABNORMAL LOW (ref 12.0–16.0)

## 2016-11-22 LAB — BASIC METABOLIC PANEL
Anion gap: 5 (ref 5–15)
BUN: 15 mg/dL (ref 6–20)
CO2: 26 mmol/L (ref 22–32)
Calcium: 8.3 mg/dL — ABNORMAL LOW (ref 8.9–10.3)
Chloride: 106 mmol/L (ref 101–111)
Creatinine, Ser: 0.77 mg/dL (ref 0.61–1.24)
GFR calc Af Amer: >60 mL/min (ref >60)
GFR calc non Af Amer: >60 mL/min (ref >60)
Glucose, Bld: 99 mg/dL (ref 65–99)
Potassium: 3.7 mmol/L (ref 3.5–5.1)
Sodium: 137 mmol/L (ref 135–145)

## 2016-11-22 LAB — CBC
HCT: 30.5 % — ABNORMAL LOW (ref 39.0–52.0)
Hemoglobin: 9.9 g/dL — ABNORMAL LOW (ref 13.0–17.0)
MCH: 29.6 pg (ref 26.0–34.0)
MCHC: 32.5 g/dL (ref 30.0–36.0)
MCV: 91.3 fL (ref 78.0–100.0)
Platelets: 187 10*3/uL (ref 150–400)
RBC: 3.34 MIL/uL — ABNORMAL LOW (ref 4.22–5.81)
RDW: 14.2 % (ref 11.5–15.5)
WBC: 6.6 10*3/uL (ref 4.0–10.5)

## 2016-11-22 MED ORDER — FUROSEMIDE 10 MG/ML IJ SOLN
40.0000 mg | Freq: Once | INTRAMUSCULAR | Status: AC
Start: 2016-11-22 — End: 2016-11-22
  Administered 2016-11-22: 40 mg via INTRAVENOUS
  Filled 2016-11-22: qty 4

## 2016-11-22 NOTE — Progress Notes (Signed)
Patient ID: [name] [name], male   DOB: 1961-04-02, 56 y.o.   MRN: [id number] TCTS DAILY ICU PROGRESS NOTE                   [address]            [place] [address]          [phone]   5 Days Post-Op Procedure(s) (LRB): CORONARY ARTERY BYPASS GRAFTING (CABG) x 5, USING RIGHT INTERNAL MAMMARY ARTERY AND RIGHT GREATER SAPHENOUS VEIN HARVESTED ENDOSCOPICALLY (N/A) TRANSESOPHAGEAL ECHOCARDIOGRAM (TEE) (N/A)  Total Length of Stay:  LOS: 9 days   Subjective: Patient in sinus this am, feels better, sitting in chair , milrinone off  Objective: Vital signs in last 24 hours: Temp:  [97.6 F (36.4 C)-98.4 F (36.9 C)] 98.4 F (36.9 C) (07/15 0812) Pulse Rate:  [65-98] 98 (07/15 0812) Cardiac Rhythm: Normal sinus rhythm (07/15 0812) Resp:  [13-27] 18 (07/15 0812) BP: (89-157)/(62-91) 138/82 (07/15 0812) SpO2:  [90 %-100 %] 96 % (07/15 0812)  Filed Weights   11/19/16 0500 11/20/16 0600 11/21/16 0405  Weight: 224 lb (101.6 kg) 222 lb 10.6 oz (101 kg) 218 lb 4.1 oz (99 kg)    Weight change:    Hemodynamic parameters for last 24 hours:    Intake/Output from previous day: 07/14 0701 - 07/15 0700 In: 778.7 [P.O.:720; I.V.:58.7] Out: 900 [Urine:900]  Intake/Output this shift: Total I/O In: -  Out: 450 [Urine:450]  Current Meds: Scheduled Meds: . acetaminophen  1,000 mg Oral Q6H   Or  . acetaminophen (TYLENOL) oral liquid 160 mg/5 mL  1,000 mg Per Tube Q6H  . amiodarone  400 mg Oral Q12H   Followed by  . [START ON 11/26/2016] amiodarone  400 mg Oral Daily  . aspirin EC  325 mg Oral Daily   Or  . aspirin  324 mg Per Tube Daily  . atorvastatin  80 mg Oral q1800  . bisacodyl  10 mg Oral Daily   Or  . bisacodyl  10 mg Rectal Daily  . Chlorhexidine Gluconate Cloth  6 each Topical Daily  . clotrimazole   Topical BID  . docusate sodium  200 mg Oral Daily  . fentaNYL  25 mcg Transdermal Q72H  . gabapentin  600 mg Oral TID  . insulin aspart  0-24 Units  Subcutaneous Q4H  . insulin glargine  20 Units Subcutaneous BID  . lidocaine  1 patch Transdermal Q24H  . metoCLOPramide (REGLAN) injection  10 mg Intravenous Q6H  . metoprolol tartrate  25 mg Oral BID   Or  . metoprolol tartrate  12.5 mg Per Tube BID  . pantoprazole  40 mg Oral Daily  . potassium chloride  20 mEq Oral Daily  . sodium chloride flush  3 mL Intravenous Q12H  . traZODone  100 mg Oral QHS   Continuous Infusions: . sodium chloride 20 mL/hr at 11/17/16 2000  . sodium chloride    . sodium chloride 10 mL/hr at 11/17/16 2000  . lactated ringers 10 mL/hr at 11/19/16 2000  . lactated ringers 10 mL/hr at 11/17/16 1531  . nitroGLYCERIN    . phenylephrine (NEO-SYNEPHRINE) Adult infusion Stopped (11/18/16 0530)   PRN Meds:.sodium chloride, guaiFENesin-dextromethorphan, metoprolol tartrate, midazolam, morphine injection, ondansetron (ZOFRAN) IV, oxyCODONE, sodium chloride flush, traMADol  General appearance: alert, cooperative and no distress Neurologic: intact Heart: regular rate and rhythm, S1, S2 normal, no murmur, click, rub or gallop Lungs: diminished breath sounds bibasilar Abdomen: soft,  non-tender; bowel sounds normal; no masses,  no organomegaly Extremities: extremities normal, atraumatic, no cyanosis or edema and Homans sign is negative, no sign of DVT Wound: sternum intact  Lab Results: CBC: Recent Labs  11/20/16 0400 11/22/16 0500  WBC 11.2* 6.6  HGB 10.5* 9.9*  HCT 32.1* 30.5*  PLT 148* 187   BMET:  Recent Labs  11/21/16 0444 11/22/16 0500  NA 136 137  K 3.9 3.7  CL 102 106  CO2 28 26  GLUCOSE 152* 99  BUN 18 15  CREATININE 0.81 0.77  CALCIUM 8.3* 8.3*    CMET: Lab Results  Component Value Date   WBC 6.6 11/22/2016   HGB 9.9 (L) 11/22/2016   HCT 30.5 (L) 11/22/2016   PLT 187 11/22/2016   GLUCOSE 99 11/22/2016   CHOL 111 11/13/2016   TRIG 244 (H) 11/13/2016   HDL 26 (L) 11/13/2016   LDLCALC 36 11/13/2016   ALT 59 11/18/2016   AST 52  (H) 11/18/2016   NA 137 11/22/2016   K 3.7 11/22/2016   CL 106 11/22/2016   CREATININE 0.77 11/22/2016   BUN 15 11/22/2016   CO2 26 11/22/2016   TSH 4.353 11/18/2016   INR 1.07 11/13/2016   HGBA1C 12.0 (H) 11/13/2016   MICROALBUR 2.6 08/06/2015      PT/INR: No results for input(s): LABPROT, INR in the last 72 hours. Radiology: Dg Chest Port 1 View  Result Date: 11/22/2016 CLINICAL DATA:  Status post CABG. EXAM: PORTABLE CHEST 1 VIEW COMPARISON:  11/20/2016. FINDINGS: Enlarged cardiomediastinal silhouette. Improving lung volumes and improved vascular congestion. No consolidation or frank edema. Status post CABG. No pneumothorax. No visible chest or mediastinal tube. IMPRESSION: Improved aeration.  No pneumothorax. Electronically Signed   By: Elsie StainJohn T [name] M.D.   On: 11/22/2016 07:16     Assessment/Plan: S/P Procedure(s) (LRB): CORONARY ARTERY BYPASS GRAFTING (CABG) x 5, USING RIGHT INTERNAL MAMMARY ARTERY AND RIGHT GREATER SAPHENOUS VEIN HARVESTED ENDOSCOPICALLY (N/A) TRANSESOPHAGEAL ECHOCARDIOGRAM (TEE) (N/A) Off milrinone, cox 50 ? , patient feels better with out symptoms will not restart milrinone at this point, lv function 500-60 EF preop  On po cordrone and lopressor D/c sheath,  To step down in am   [name] [name] 11/22/2016 9:48 AM

## 2016-11-22 NOTE — Progress Notes (Signed)
Patient ID: [name] [name], male   DOB: 04/02/1961, 56 y.o.   MRN: [id number]  EVENING ROUNDS NOTE :     [address]       [place] [id number]             [phone]                 5 Days Post-Op Procedure(s) (LRB): CORONARY ARTERY BYPASS GRAFTING (CABG) x 5, USING RIGHT INTERNAL MAMMARY ARTERY AND RIGHT GREATER SAPHENOUS VEIN HARVESTED ENDOSCOPICALLY (N/A) TRANSESOPHAGEAL ECHOCARDIOGRAM (TEE) (N/A)  Total Length of Stay:  LOS: 9 days  BP 119/77   Pulse 80   Temp 98.2 F (36.8 C) (Oral)   Resp 19   Ht 5\' 11"  (1.803 m)   Wt 219 lb 2.2 oz (99.4 kg)   SpO2 93%   BMI 30.56 kg/m   .Intake/Output      07/14 0701 - 07/15 0700 07/15 0701 - 07/16 0700   P.O. 720 450   I.V. (mL/kg) 58.7 (0.6)    Total Intake(mL/kg) 778.7 (7.9) 450 (4.5)   Urine (mL/kg/hr) 900 (0.4) 1350 (1.2)   Total Output 900 1350   Net -121.3 -900        Urine Occurrence 1 x    Stool Occurrence 1 x      . sodium chloride 20 mL/hr at 11/17/16 2000  . sodium chloride    . sodium chloride 10 mL/hr at 11/17/16 2000  . lactated ringers 10 mL/hr at 11/19/16 2000  . lactated ringers 10 mL/hr at 11/17/16 1531  . nitroGLYCERIN       Lab Results  Component Value Date   WBC 6.6 11/22/2016   HGB 9.9 (L) 11/22/2016   HCT 30.5 (L) 11/22/2016   PLT 187 11/22/2016   GLUCOSE 99 11/22/2016   CHOL 111 11/13/2016   TRIG 244 (H) 11/13/2016   HDL 26 (L) 11/13/2016   LDLCALC 36 11/13/2016   ALT 59 11/18/2016   AST 52 (H) 11/18/2016   NA 137 11/22/2016   K 3.7 11/22/2016   CL 106 11/22/2016   CREATININE 0.77 11/22/2016   BUN 15 11/22/2016   CO2 26 11/22/2016   TSH 4.353 11/18/2016   INR 1.07 11/13/2016   HGBA1C 12.0 (H) 11/13/2016   MICROALBUR 2.6 08/06/2015   Stable day Mild sob when flat, sat good walking around unit  Delight OvensEdward B Lundyn Coste MD  Beeper [phone] Office [phone] 11/22/2016 6:26 PM

## 2016-11-23 ENCOUNTER — Inpatient Hospital Stay (HOSPITAL_COMMUNITY): Payer: Medicare Other

## 2016-11-23 DIAGNOSIS — I4891 Unspecified atrial fibrillation: Secondary | ICD-10-CM

## 2016-11-23 DIAGNOSIS — I9789 Other postprocedural complications and disorders of the circulatory system, not elsewhere classified: Secondary | ICD-10-CM

## 2016-11-23 LAB — COMPREHENSIVE METABOLIC PANEL
ALT: 29 U/L (ref 17–63)
AST: 27 U/L (ref 15–41)
Albumin: 2.4 g/dL — ABNORMAL LOW (ref 3.5–5.0)
Alkaline Phosphatase: 103 U/L (ref 38–126)
Anion gap: 7 (ref 5–15)
BUN: 15 mg/dL (ref 6–20)
CO2: 27 mmol/L (ref 22–32)
Calcium: 8.5 mg/dL — ABNORMAL LOW (ref 8.9–10.3)
Chloride: 104 mmol/L (ref 101–111)
Creatinine, Ser: 0.87 mg/dL (ref 0.61–1.24)
GFR calc Af Amer: >60 mL/min (ref >60)
GFR calc non Af Amer: >60 mL/min (ref >60)
Glucose, Bld: 56 mg/dL — ABNORMAL LOW (ref 65–99)
Potassium: 3.5 mmol/L (ref 3.5–5.1)
Sodium: 138 mmol/L (ref 135–145)
Total Bilirubin: 0.9 mg/dL (ref 0.3–1.2)
Total Protein: 5.4 g/dL — ABNORMAL LOW (ref 6.5–8.1)

## 2016-11-23 LAB — GLUCOSE, CAPILLARY
Glucose-Capillary: 52 mg/dL — ABNORMAL LOW (ref 65–99)
Glucose-Capillary: 154 mg/dL — ABNORMAL HIGH (ref 65–99)
Glucose-Capillary: 134 mg/dL — ABNORMAL HIGH (ref 65–99)
Glucose-Capillary: 151 mg/dL — ABNORMAL HIGH (ref 65–99)
Glucose-Capillary: 190 mg/dL — ABNORMAL HIGH (ref 65–99)
Glucose-Capillary: 148 mg/dL — ABNORMAL HIGH (ref 65–99)
Glucose-Capillary: 154 mg/dL — ABNORMAL HIGH (ref 65–99)

## 2016-11-23 MED ORDER — INSULIN ASPART 100 UNIT/ML ~~LOC~~ SOLN
0.0000 [IU] | Freq: Three times a day (TID) | SUBCUTANEOUS | Status: DC
  Administered 2016-11-23 (×2): 2 [IU] via SUBCUTANEOUS
  Administered 2016-11-23 – 2016-11-24 (×2): 4 [IU] via SUBCUTANEOUS
  Administered 2016-11-24: 2 [IU] via SUBCUTANEOUS
  Administered 2016-11-24: 4 [IU] via SUBCUTANEOUS
  Administered 2016-11-25 (×2): 8 [IU] via SUBCUTANEOUS

## 2016-11-23 MED ORDER — SODIUM CHLORIDE 0.9% FLUSH
3.0000 mL | Freq: Two times a day (BID) | INTRAVENOUS | Status: DC
  Administered 2016-11-23 – 2016-11-24 (×3): 3 mL via INTRAVENOUS

## 2016-11-23 MED ORDER — MOVING RIGHT ALONG BOOK
Freq: Once | Status: DC
  Filled 2016-11-23: qty 1

## 2016-11-23 MED ORDER — MAGNESIUM HYDROXIDE 400 MG/5ML PO SUSP: 30.0000 mL | Freq: Every day | ORAL | Status: DC | PRN

## 2016-11-23 MED ORDER — FUROSEMIDE 40 MG PO TABS
40.0000 mg | ORAL_TABLET | Freq: Every day | ORAL | Status: DC
  Administered 2016-11-24 – 2016-11-25 (×2): 40 mg via ORAL
  Filled 2016-11-23 (×2): qty 1

## 2016-11-23 MED ORDER — SODIUM CHLORIDE 0.9% FLUSH: 3.0000 mL | INTRAVENOUS | Status: DC | PRN

## 2016-11-23 MED ORDER — SODIUM CHLORIDE 0.9 % IV SOLN: 250.0000 mL | INTRAVENOUS | Status: DC | PRN

## 2016-11-23 MED ORDER — FUROSEMIDE 10 MG/ML IJ SOLN
40.0000 mg | Freq: Once | INTRAMUSCULAR | Status: AC
  Administered 2016-11-23: 40 mg via INTRAVENOUS
  Filled 2016-11-23: qty 4

## 2016-11-23 NOTE — Progress Notes (Signed)
6 Days Post-Op Procedure(s) (LRB): CORONARY ARTERY BYPASS GRAFTING (CABG) x 5, USING RIGHT INTERNAL MAMMARY ARTERY AND RIGHT GREATER SAPHENOUS VEIN HARVESTED ENDOSCOPICALLY (N/A) TRANSESOPHAGEAL ECHOCARDIOGRAM (TEE) (N/A) Subjective: nsr CXR clear Ambulating well  Objective: Vital signs in last 24 hours: Temp:  [97.7 F (36.5 C)-98.9 F (37.2 C)] 97.7 F (36.5 C) (07/16 0336) Pulse Rate:  [67-105] 78 (07/16 0800) Cardiac Rhythm: Normal sinus rhythm (07/16 0800) Resp:  [12-24] 20 (07/16 0800) BP: (95-152)/(52-90) 152/85 (07/16 0800) SpO2:  [89 %-98 %] 95 % (07/16 0800) Weight:  [213 lb 3 oz (96.7 kg)-219 lb 2.2 oz (99.4 kg)] 213 lb 3 oz (96.7 kg) (07/16 0336)  Hemodynamic parameters for last 24 hours:  stable  Intake/Output from previous day: 07/15 0701 - 07/16 0700 In: 450 [P.O.:450] Out: 1350 [Urine:1350] Intake/Output this shift: Total I/O In: 620 [I.V.:620] Out: -        Exam    General- alert and comfortable   Lungs- clear without rales, wheezes   Cor- regular rate and rhythm, no murmur , gallop   Abdomen- soft, non-tender   Extremities - warm, non-tender, minimal edema   Neuro- oriented, appropriate, no focal weakness   Lab Results:  Recent Labs  11/22/16 0500  WBC 6.6  HGB 9.9*  HCT 30.5*  PLT 187   BMET:  Recent Labs  11/22/16 0500 11/23/16 0229  NA 137 138  K 3.7 3.5  CL 106 104  CO2 26 27  GLUCOSE 99 56*  BUN 15 15  CREATININE 0.77 0.87  CALCIUM 8.3* 8.5*    PT/INR: No results for input(s): LABPROT, INR in the last 72 hours. ABG    Component Value Date/Time   PHART 7.403 11/18/2016 1510   HCO3 21.2 11/18/2016 1510   TCO2 26 11/20/2016 0015   ACIDBASEDEF 2.8 (H) 11/18/2016 1510   O2SAT 57.3 11/22/2016 0515   CBG (last 3)   Recent Labs  11/22/16 2324 11/23/16 0329 11/23/16 0445  GLUCAP 122* 52* 154*    Assessment/Plan: S/P Procedure(s) (LRB): CORONARY ARTERY BYPASS GRAFTING (CABG) x 5, USING RIGHT INTERNAL MAMMARY ARTERY  AND RIGHT GREATER SAPHENOUS VEIN HARVESTED ENDOSCOPICALLY (N/A) TRANSESOPHAGEAL ECHOCARDIOGRAM (TEE) (N/A) Mobilize Diuresis Diabetes control Plan for transfer to step-down: see transfer orders   LOS: 10 days    [name] [name] 11/23/2016

## 2016-11-23 NOTE — Progress Notes (Signed)
CARDIAC REHAB PHASE I  Pt in bed, states he walked just before transferring, declines ambulation at this time. Pt tearful, states he is in pain and is frustrated. Encouraged additional ambulation today, will follow up tomorrow. Pt in bed, call bell within reach.  Joylene GrapesEmily C Calel Pisarski, RN, BSN 11/23/2016 1:57 PM

## 2016-11-23 NOTE — Progress Notes (Signed)
Progress Note  Patient Name: [name] [name] Date of Encounter: 11/23/2016  Primary Cardiologist: Jens Som  Subjective   A little grouchy due to pain, but overall doing well. Has walked several laps in the unit. atrial fibrillation has not recurred since yesterday 1600h, but he had several episodes yesterday morning and early afternoon, generally <30 min. He asks whether this could be due to stopping the use of CPAP (he has not used it in over a year).  Inpatient Medications    Scheduled Meds: . amiodarone  400 mg Oral Q12H  . aspirin EC  325 mg Oral Daily   Or  . aspirin  324 mg Per Tube Daily  . atorvastatin  80 mg Oral q1800  . bisacodyl  10 mg Oral Daily   Or  . bisacodyl  10 mg Rectal Daily  . Chlorhexidine Gluconate Cloth  6 each Topical Daily  . clotrimazole   Topical BID  . docusate sodium  200 mg Oral Daily  . fentaNYL  25 mcg Transdermal Q72H  . [START ON 11/24/2016] furosemide  40 mg Oral Daily  . gabapentin  600 mg Oral TID  . insulin aspart  0-24 Units Subcutaneous TID AC & HS  . insulin glargine  20 Units Subcutaneous BID  . lidocaine  1 patch Transdermal Q24H  . metoprolol tartrate  25 mg Oral BID  . moving right along book   Does not apply Once  . pantoprazole  40 mg Oral Daily  . potassium chloride  20 mEq Oral Daily  . sodium chloride flush  3 mL Intravenous Q12H  . sodium chloride flush  3 mL Intravenous Q12H  . traZODone  100 mg Oral QHS   Continuous Infusions: . sodium chloride 20 mL/hr at 11/17/16 2000  . sodium chloride    . sodium chloride 10 mL/hr at 11/17/16 2000  . sodium chloride     PRN Meds: sodium chloride, sodium chloride, guaiFENesin-dextromethorphan, magnesium hydroxide, metoprolol tartrate, morphine injection, ondansetron (ZOFRAN) IV, oxyCODONE, sodium chloride flush, sodium chloride flush, traMADol   Vital Signs    Vitals:   11/23/16 0700 11/23/16 0800 11/23/16 0820 11/23/16 0900  BP: 121/69 (!) 152/85  117/71  Pulse: 70 78   82  Resp: 17 20  16   Temp:  98.2 F (36.8 C)    TempSrc:  Oral    SpO2: 98% 95% 96% 96%  Weight:      Height:        Intake/Output Summary (Last 24 hours) at 11/23/16 0942 Last data filed at 11/23/16 0800  Gross per 24 hour  Intake              820 ml  Output              900 ml  Net              -80 ml   Filed Weights   11/21/16 0405 11/22/16 1000 11/23/16 0336  Weight: 218 lb 4.1 oz (99 kg) 219 lb 2.2 oz (99.4 kg) 213 lb 3 oz (96.7 kg)    Telemetry    NSR with frequent brief episodes of atrial fibrillation, none in last 16 hours. - Personally Reviewed  ECG    No new tracing - Personally Reviewed  Physical Exam  No acute distress GEN: No acute distress.   Neck: No JVD Cardiac: RRR, no murmurs, rubs, or gallops. Well healing sternotomy scar Respiratory: Clear to auscultation bilaterally. GI: Soft, nontender, non-distended  MS: No  edema; No deformity. Neuro:  Nonfocal  Psych: Normal affect   Labs    Chemistry Recent Labs Lab 11/18/16 1708  11/21/16 0444 11/22/16 0500 11/23/16 0229  NA  --   < > 136 137 138  K  --   < > 3.9 3.7 3.5  CL  --   < > 102 106 104  CO2  --   < > 28 26 27   GLUCOSE  --   < > 152* 99 56*  BUN  --   < > 18 15 15   CREATININE 0.84  < > 0.81 0.77 0.87  CALCIUM  --   < > 8.3* 8.3* 8.5*  PROT 5.7*  --   --   --  5.4*  ALBUMIN 3.1*  --   --   --  2.4*  AST 52*  --   --   --  27  ALT 59  --   --   --  29  ALKPHOS 49  --   --   --  103  BILITOT 1.7*  --   --   --  0.9  GFRNONAA >60  < > >60 >60 >60  GFRAA >60  < > >60 >60 >60  ANIONGAP  --   < > 6 5 7   < > = values in this interval not displayed.   Hematology Recent Labs Lab 11/19/16 0330 11/20/16 0015 11/20/16 0400 11/22/16 0500  WBC 9.3  --  11.2* 6.6  RBC 3.43*  --  3.52* 3.34*  HGB 10.6* 10.2* 10.5* 9.9*  HCT 31.0* 30.0* 32.1* 30.5*  MCV 90.4  --  91.2 91.3  MCH 30.9  --  29.8 29.6  MCHC 34.2  --  32.7 32.5  RDW 14.5  --  14.3 14.2  PLT 111*  --  148* 187     Cardiac EnzymesNo results for input(s): TROPONINI in the last 168 hours. No results for input(s): TROPIPOC in the last 168 hours.   BNPNo results for input(s): BNP, PROBNP in the last 168 hours.   DDimer No results for input(s): DDIMER in the last 168 hours.   Radiology    Dg Chest Port 1 View  Result Date: 11/23/2016 CLINICAL DATA:  Chest tube in place EXAM: PORTABLE CHEST 1 VIEW COMPARISON:  11/22/2016 FINDINGS: No chest tube is visualized.  No pneumothorax is seen. Lungs are essentially clear.  No pleural effusion. Cardiomegaly.  Postsurgical changes related to prior CABG. Interval removal of right IJ venous sheath. Median sternotomy. IMPRESSION: Interval removal of right IJ venous sheath. Otherwise unchanged. Electronically Signed   By: Charline BillsSriyesh  Krishnan M.D.   On: 11/23/2016 07:15   Dg Chest Port 1 View  Result Date: 11/22/2016 CLINICAL DATA:  Status post CABG. EXAM: PORTABLE CHEST 1 VIEW COMPARISON:  11/20/2016. FINDINGS: Enlarged cardiomediastinal silhouette. Improving lung volumes and improved vascular congestion. No consolidation or frank edema. Status post CABG. No pneumothorax. No visible chest or mediastinal tube. IMPRESSION: Improved aeration.  No pneumothorax. Electronically Signed   By: Elsie StainJohn T Curnes M.D.   On: 11/22/2016 07:16    Cardiac Studies   Echo EF 65%  Patient Profile     56 y.o. 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 AFib: multiple recurrent episodes. Multiple risk factors for embolic events - CHADSVasc 3 (DM, CAD, HTN) Would recommend anticoagulation after TPM wires removed. Continue amiodarone in 200 mg daily maintenance  dose for about 30 days postop. If atrial fibrillation does not recur in subsequent 2-3 months, may consider stopping anticoagulation. 2. CAD s/p CABG:  Progressing well. Transfer to floor today. 3. Hx of [name]:  Would recommend at a minimum that  he has a repeat sleep study. Discussed relationship between [name] and burden of atrial fibrillation. 4. HTN: BP recovering postop. Now on metoprolol. He was on lisinopril at home, may need to restart (probably a lower dose) in next few days.  Signed, Thurmon Fair, MD  11/23/2016, 9:42 AM

## 2016-11-23 NOTE — Op Note (Signed)
NAME:  KAIYU, MIRABAL                    ACCOUNT NO.:  MEDICAL RECORD NO.:  [id number]  LOCATION:                                 FACILITY:  PHYSICIAN:  Kerin Perna, M.D.  DATE OF BIRTH:  1960-10-05  DATE OF PROCEDURE:  11/17/2016 DATE OF DISCHARGE:                              OPERATIVE REPORT   OPERATION: 1. Coronary artery bypass grafting x5 (left internal mammary artery to     left anterior descending, saphenous vein graft to diagonal,     saphenous vein graft to obtuse marginal, saphenous vein graft to     posterior descending-posterior lateral). 2. Endoscopic harvest of right leg greater saphenous vein. 3. Placed in the right femoral A-line.  SURGEON:  Kerin Perna, M.D.  ASSISTANT:  Doree Fudge, PA-C.  ANESTHESIA:  General.  PREOPERATIVE DIAGNOSES: 1. Non-ST-elevation myocardial infarction. 2. Severe 3-vessel coronary artery disease. 3. Previous multiple stents to the coronary vessels. 4. Diabetes mellitus type 2.  POSTOPERATIVE DIAGNOSES: 1. Non-ST-elevation myocardial infarction. 2. Severe 3-vessel coronary artery disease. 3. Previous multiple stents to the coronary vessels. 4. Diabetes mellitus type 2.  CLINICAL NOTE:  The patient is an obese 56 year old diabetic, nonsmoker with previous history of coronary disease who presented with a non-ST- elevation MI.  He was found to have total occlusion of his dominant RCA which was filled with multiple PCI stents.  He had severe diffuse 3- vessel disease and moderate LV dysfunction.  He was felt to be a candidate for surgical coronary revascularization.  Prior to surgery, I discussed the procedure of CABG for treatment of his CAD.  I discussed the major aspects of the operation including the use of general anesthesia and cardiopulmonary bypass, the location of the surgical incisions, and the expected postoperative hospital recovery.  I discussed with the patient the risks to him of coronary artery  bypass grafting including the risks of stroke, bleeding, blood transfusion requirement, postoperative pulmonary problems including pleural effusion, postoperative arrhythmias, postoperative infection, and death. After reviewing these issues, he demonstrated his understanding and agreed to proceed with surgery under what I felt was an informed consent.  OPERATIVE FINDINGS: 1. Diffusely diseased diabetic pattern of disease with inferior wall     scarring. 2. No blood products required. 3. Preserved LV systolic function after separation from     cardiopulmonary bypass.  OPERATIVE PROCEDURE:  The patient was brought to the operating room and placed supine on the operating table.  General anesthesia was induced under invasive hemodynamic monitoring.  The chest, abdomen, and legs were prepped with Betadine and draped as a sterile field.  A proper time- out was performed.  A sternal incision was made as the saphenous vein was harvested endoscopically from the right leg.  The left internal mammary artery was harvested as a pedicle graft from its origin at the subclavian vessels.  It was a 1.5-mm vessel with good flow.  The pericardium was opened and suspended.  There was a heavy layer of epicardial fat and fat over the mediastinal structures.  The aorta was foreshortened.  Pursestrings were placed in ascending aorta and right atrium.  Heparin was administered and when the  ACT was documented as being therapeutic, the patient was cannulated and placed on cardiopulmonary bypass.  The coronaries were identified for grafting. The posterior descending and posterolateral branches were both diseased but adequate targets distal to a totally occluded right coronary artery. The LAD, the ramus, and the OM branch of the circumflex were also suboptimal but adequate targets.  Cardioplegia cannulas were placed both antegrade and retrograde cold blood cardioplegia, and the mammary artery and vein grafts  were prepared for the distal anastomoses.  The patient was cooled to 32 degrees.  Aortic crossclamp was applied.  1.2 L of cold blood cardioplegia was delivered in split doses between the antegrade aortic and the retrograde coronary sinus catheters.  There was good cardioplegic arrest and septal temperature dropped less than 12 degrees. Cardioplegia was delivered every 20 minutes while the crossclamp was in place.  The distal coronary anastomoses were performed.  The first distal anastomosis was the proximal portion of the sequential vein graft to the posterior descending and posterolateral.  The posterior descending was 1.5-mm vessel.  It was occluded proximally.  The reverse saphenous vein was sewn side-to-side with running 7-0 Prolene with good flow through the graft.  Cardioplegia was redosed.  The second distal anastomosis was the continuation of the sequential vein graft to the posterolateral branch.  Posterolateral branch was slightly smaller but graftable.  It was totally occluded proximally. The end of the vein was sewn end-to-side with running 7-0 Prolene. There was good flow through it back through the graft.  Cardioplegia was redosed.  The third distal anastomosis was then to the OM branch of the circumflex.  This was heavily diseased and had 90% proximal stenosis.  A reverse saphenous vein was sewn end-to-side with running 7-0 Prolene, good flow through the graft.  Cardioplegia was redosed.  The fourth distal anastomosis was the ramus intermediate branch of the left coronary.  This was a 1.5-mm vessel intramyocardial in location. He had a proximal 80% stenosis.  A reverse saphenous vein was sewn end- to-side with running 7-0 Prolene with good flow through the graft. Cardioplegia was again redosed.  The fifth distal anastomosis was the distal third of the LAD.  It had proximal 90% stenosis.  The left IMA pedicle was brought through an opening and the left lateral  pericardium was brought down onto the LAD and sewn end-to-side with a running 8-0 Prolene.  There was good flow through the anastomosis after briefly releasing the pedicle bulldog on the mammary artery.  The bulldog was re-applied and the pedicle was secured to the epicardium with 6-0 Prolenes.  Cardioplegia was redosed.  While the crossclamp was still in place, 3 proximal vein anastomoses were performed on the ascending aorta with a 4.5-mm punch and running 6- 0 Prolene.  The ascending aorta was thin and the sutures were carefully placed.  Prior to tying down the final proximal anastomosis, the air was vented from the coronaries with a dose of retrograde warm blood cardioplegia.  The crossclamp was removed.  The vein grafts were de-aired and opened and each had good flow and hemostasis was documented at the proximal and distal anastomoses.  The heart resumed a spontaneous rhythm.  The cardioplegia cannulas were removed.  The patient was rewarmed and reperfused.  Temporary pacing wires were applied.  The lungs were expanded.  The ventilator was resumed.  The patient was weaned from cardiopulmonary bypass.  He was on low-dose milrinone and dopamine.  Cardiac output was over 6 L.  Mean arterial  pressure was slightly low because of vasodilatation and this was treated with Neo-Synephrine.  The patient was given protamine without adverse reaction.  The LV global function was preserved following separation of the cardiopulmonary bypass.  There was trace mitral regurgitation.  With protamine, the patient had some increased vasodilatation, which was treated with Neo-Synephrine.  A femoral A-line was placed to make sure his blood pressure was being measured accurately.  The protamine was then finished.  LV function still looked very strong. The mediastinum was irrigated with warm saline.  The superior pericardial fat was closed over the aorta.  Anterior mediastinal and left pleural chest  tubes were placed and brought out through separate incisions.  The sternum was closed with wire.  The patient remained stable.  The pectoralis fascia was closed with a running #1 Vicryl.  The subcutaneous and skin layers were closed with running Vicryl, and sterile dressings were applied.  Total cardiopulmonary bypass time was 160 minutes.     Kerin PernaPeter Van Trigt, M.D.     PV/MEDQ  D:  11/21/2016  T:  11/21/2016  Job:  [id number]  cc:   Landry Corporalavid Wayne Harding, MD

## 2016-11-24 ENCOUNTER — Inpatient Hospital Stay (HOSPITAL_COMMUNITY): Payer: Medicare Other

## 2016-11-24 LAB — BASIC METABOLIC PANEL
Anion gap: 10 (ref 5–15)
BUN: 12 mg/dL (ref 6–20)
CO2: 25 mmol/L (ref 22–32)
Calcium: 8.7 mg/dL — ABNORMAL LOW (ref 8.9–10.3)
Chloride: 103 mmol/L (ref 101–111)
Creatinine, Ser: 0.89 mg/dL (ref 0.61–1.24)
GFR calc Af Amer: >60 mL/min (ref >60)
GFR calc non Af Amer: >60 mL/min (ref >60)
Glucose, Bld: 113 mg/dL — ABNORMAL HIGH (ref 65–99)
Potassium: 3.6 mmol/L (ref 3.5–5.1)
Sodium: 138 mmol/L (ref 135–145)

## 2016-11-24 LAB — GLUCOSE, CAPILLARY
Glucose-Capillary: 115 mg/dL — ABNORMAL HIGH (ref 65–99)
Glucose-Capillary: 191 mg/dL — ABNORMAL HIGH (ref 65–99)
Glucose-Capillary: 190 mg/dL — ABNORMAL HIGH (ref 65–99)
Glucose-Capillary: 223 mg/dL — ABNORMAL HIGH (ref 65–99)

## 2016-11-24 LAB — CBC
HCT: 34.8 % — ABNORMAL LOW (ref 39.0–52.0)
Hemoglobin: 11.5 g/dL — ABNORMAL LOW (ref 13.0–17.0)
MCH: 30 pg (ref 26.0–34.0)
MCHC: 33 g/dL (ref 30.0–36.0)
MCV: 90.9 fL (ref 78.0–100.0)
Platelets: 279 10*3/uL (ref 150–400)
RBC: 3.83 MIL/uL — ABNORMAL LOW (ref 4.22–5.81)
RDW: 13.9 % (ref 11.5–15.5)
WBC: 8.8 10*3/uL (ref 4.0–10.5)

## 2016-11-24 MED ORDER — WARFARIN - PHYSICIAN DOSING INPATIENT
Freq: Every day | Status: DC
  Administered 2016-11-24: 18:00:00

## 2016-11-24 MED ORDER — POTASSIUM CHLORIDE CRYS ER 20 MEQ PO TBCR
40.0000 meq | EXTENDED_RELEASE_TABLET | Freq: Once | ORAL | Status: AC
  Administered 2016-11-24: 40 meq via ORAL
  Filled 2016-11-24: qty 2

## 2016-11-24 MED ORDER — WARFARIN SODIUM 5 MG PO TABS
5.0000 mg | ORAL_TABLET | Freq: Every day | ORAL | Status: DC
  Administered 2016-11-24: 5 mg via ORAL
  Filled 2016-11-24: qty 1

## 2016-11-24 NOTE — Progress Notes (Addendum)
Pt's HR has been going into Afib and increasing into the 140s non sustained then it would decrease back down to 70s NSR all night. It has increased in frequency. McClean, MD was notified of event and orders were to give PRN metoprolol. Metoprolol given and EKG taken. Pt denied chest pain and is asymptomatic. Will continue to monitor pt closely.

## 2016-11-24 NOTE — Progress Notes (Signed)
Progress Note  Patient Name: [name] [name] Date of Encounter: 11/24/2016  Primary Cardiologist: Jens Som  Subjective   Still has a worried look on his face, but otherwise doing well. Walked briskly in the hall without difficulty. TPW coming out today. Still with occasional paroxysmal atrial fibrillation, most episodes under 1-2 minutes.  Inpatient Medications    Scheduled Meds: . amiodarone  400 mg Oral Q12H  . aspirin EC  325 mg Oral Daily   Or  . aspirin  324 mg Per Tube Daily  . atorvastatin  80 mg Oral q1800  . bisacodyl  10 mg Oral Daily   Or  . bisacodyl  10 mg Rectal Daily  . Chlorhexidine Gluconate Cloth  6 each Topical Daily  . clotrimazole   Topical BID  . docusate sodium  200 mg Oral Daily  . fentaNYL  25 mcg Transdermal Q72H  . furosemide  40 mg Oral Daily  . gabapentin  600 mg Oral TID  . insulin aspart  0-24 Units Subcutaneous TID AC & HS  . insulin glargine  20 Units Subcutaneous BID  . lidocaine  1 patch Transdermal Q24H  . metoprolol tartrate  25 mg Oral BID  . moving right along book   Does not apply Once  . pantoprazole  40 mg Oral Daily  . potassium chloride  20 mEq Oral Daily  . sodium chloride flush  3 mL Intravenous Q12H  . sodium chloride flush  3 mL Intravenous Q12H  . traZODone  100 mg Oral QHS   Continuous Infusions: . sodium chloride 20 mL/hr at 11/17/16 2000  . sodium chloride    . sodium chloride 10 mL/hr at 11/17/16 2000  . sodium chloride     PRN Meds: sodium chloride, sodium chloride, guaiFENesin-dextromethorphan, magnesium hydroxide, metoprolol tartrate, morphine injection, ondansetron (ZOFRAN) IV, oxyCODONE, sodium chloride flush, sodium chloride flush, traMADol   Vital Signs    Vitals:   11/24/16 0400 11/24/16 0431 11/24/16 0619 11/24/16 0804  BP:  107/69  (!) 108/59  Pulse:    74  Resp:  16  15  Temp: 98.6 F (37 C)   98.6 F (37 C)  TempSrc:    Oral  SpO2:    91%  Weight:   208 lb 11.2 oz (94.7 kg)   Height:        No intake or output data in the 24 hours ending 11/24/16 1039 Filed Weights   11/22/16 1000 11/23/16 0336 11/24/16 0619  Weight: 219 lb 2.2 oz (99.4 kg) 213 lb 3 oz (96.7 kg) 208 lb 11.2 oz (94.7 kg)    Telemetry    NSR, occ atrial fibrillation, very brief - Personally Reviewed  ECG    Afib with mild RVR, alternating with SR w PACs, LAFB - Personally Reviewed  Physical Exam   GEN: No acute distress.   Neck: No JVD Cardiac: RRR, no murmurs, rubs, or gallops.  Respiratory: Clear to auscultation bilaterally. GI: Soft, nontender, non-distended  MS: No edema; No deformity. Neuro:  Nonfocal  Psych: Normal affect   Labs    Chemistry Recent Labs Lab 11/18/16 1708  11/22/16 0500 11/23/16 0229 11/24/16 0251  NA  --   < > 137 138 138  K  --   < > 3.7 3.5 3.6  CL  --   < > 106 104 103  CO2  --   < > 26 27 25   GLUCOSE  --   < > 99 56* 113*  BUN  --   < >  15 15 12   CREATININE 0.84  < > 0.77 0.87 0.89  CALCIUM  --   < > 8.3* 8.5* 8.7*  PROT 5.7*  --   --  5.4*  --   ALBUMIN 3.1*  --   --  2.4*  --   AST 52*  --   --  27  --   ALT 59  --   --  29  --   ALKPHOS 49  --   --  103  --   BILITOT 1.7*  --   --  0.9  --   GFRNONAA >60  < > >60 >60 >60  GFRAA >60  < > >60 >60 >60  ANIONGAP  --   < > 5 7 10   < > = values in this interval not displayed.   Hematology Recent Labs Lab 11/20/16 0400 11/22/16 0500 11/24/16 0251  WBC 11.2* 6.6 8.8  RBC 3.52* 3.34* 3.83*  HGB 10.5* 9.9* 11.5*  HCT 32.1* 30.5* 34.8*  MCV 91.2 91.3 90.9  MCH 29.8 29.6 30.0  MCHC 32.7 32.5 33.0  RDW 14.3 14.2 13.9  PLT 148* 187 279     Radiology    Dg Chest 2 View  Result Date: 11/24/2016 CLINICAL DATA:  CABG. EXAM: CHEST  2 VIEW COMPARISON:  11/23/2016 .  11/12/2016.  04/27/2015 . FINDINGS: Prior CABG. Stable cardiomegaly. Low lung volumes with mild subsegmental atelectasis right upper lobe and both lung bases. Small right pleural effusion. No pneumothorax . IMPRESSION: 1. Prior CABG.   Stable cardiomegaly. 2. Low lung volumes with mild subsegmental atelectasis right upper lobe and both lung bases. Small right pleural effusion. Electronically Signed   By: Maisie Fushomas  Register   On: 11/24/2016 07:37   Dg Chest Port 1 View  Result Date: 11/23/2016 CLINICAL DATA:  Chest tube in place EXAM: PORTABLE CHEST 1 VIEW COMPARISON:  11/22/2016 FINDINGS: No chest tube is visualized.  No pneumothorax is seen. Lungs are essentially clear.  No pleural effusion. Cardiomegaly.  Postsurgical changes related to prior CABG. Interval removal of right IJ venous sheath. Median sternotomy. IMPRESSION: Interval removal of right IJ venous sheath. Otherwise unchanged. Electronically Signed   By: Charline BillsSriyesh  Krishnan M.D.   On: 11/23/2016 07:15    Patient Profile     56 y.o. 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 AFib: multiple recurrent episodes, but getting shorter and shorter. Multiple risk factors for embolic events - CHADSVasc 3 (DM, CAD, HTN). Recommend anticoagulation after discharge (e.g. Xarelto 20 mg daily). Continue amiodarone in 200 mg daily maintenance dose for about 30 days postop. If atrial fibrillation does not recur in subsequent 2-3 months, confirmed with 30 day monitor, may consider stopping anticoagulation. 2. CAD s/p CABG:  Progressing well. Probably ready for DC tomorrow. 3. Hx of [name]:  Would recommend at a minimum that he has a repeat sleep study. Discussed relationship between [name] and burden of atrial fibrillation. 4. HTN: BP recovering postop. Now on metoprolol. No need to restart ACEi at this point.  Signed, Thurmon FairMihai Kentarius Partington, MD  11/24/2016, 10:39 AM

## 2016-11-24 NOTE — Care Management Important Message (Signed)
Important Message  Patient Details  Name: Kathleen Arguehomas D Davenport MRN: [id number] Date of Birth: 03/05/1961   Medicare Important Message Given:  Yes    Kyla BalzarineShealy, Solon Alban Abena 11/24/2016, 10:41 AM

## 2016-11-24 NOTE — Progress Notes (Addendum)
[address]            [place] [address]          [phone]   7 Days Post-Op Procedure(s) (LRB): CORONARY ARTERY BYPASS GRAFTING (CABG) x 5, USING RIGHT INTERNAL MAMMARY ARTERY AND RIGHT GREATER SAPHENOUS VEIN HARVESTED ENDOSCOPICALLY (N/A) TRANSESOPHAGEAL ECHOCARDIOGRAM (TEE) (N/A)  Total Length of Stay:  LOS: 11 days   Subjective: Patient has no complaints. He hopes to go home tomorrow.   Objective: Vital signs in last 24 hours: Temp:  [98.1 F (36.7 C)-98.6 F (37 C)] 98.6 F (37 C) (07/17 0400) Pulse Rate:  [76-82] 78 (07/16 1636) Cardiac Rhythm: Normal sinus rhythm (07/16 1940) Resp:  [12-24] 16 (07/17 0431) BP: (91-152)/(64-91) 107/69 (07/17 0431) SpO2:  [94 %-96 %] 94 % (07/16 1211) FiO2 (%):  [28 %] 28 % (07/16 0820) Weight:  [94.7 kg (208 lb 11.2 oz)] 94.7 kg (208 lb 11.2 oz) (07/17 0619)  Filed Weights   11/22/16 1000 11/23/16 0336 11/24/16 0619  Weight: 99.4 kg (219 lb 2.2 oz) 96.7 kg (213 lb 3 oz) 94.7 kg (208 lb 11.2 oz)    Weight change: -4.734 kg (-10 lb 7 oz)      Intake/Output from previous day: 07/16 0701 - 07/17 0700 In: 620 [I.V.:620] Out: -   Intake/Output this shift: No intake/output data recorded.  Current Meds: Scheduled Meds: . amiodarone  400 mg Oral Q12H  . aspirin EC  325 mg Oral Daily   Or  . aspirin  324 mg Per Tube Daily  . atorvastatin  80 mg Oral q1800  . bisacodyl  10 mg Oral Daily   Or  . bisacodyl  10 mg Rectal Daily  . Chlorhexidine Gluconate Cloth  6 each Topical Daily  . clotrimazole   Topical BID  . docusate sodium  200 mg Oral Daily  . fentaNYL  25 mcg Transdermal Q72H  . furosemide  40 mg Oral Daily  . gabapentin  600 mg Oral TID  . insulin aspart  0-24 Units Subcutaneous TID AC & HS  . insulin glargine  20 Units Subcutaneous BID  . lidocaine  1 patch Transdermal Q24H  . metoprolol tartrate  25 mg Oral BID  . moving right along book   Does not apply Once  .  pantoprazole  40 mg Oral Daily  . potassium chloride  20 mEq Oral Daily  . sodium chloride flush  3 mL Intravenous Q12H  . sodium chloride flush  3 mL Intravenous Q12H  . traZODone  100 mg Oral QHS   Continuous Infusions: . sodium chloride 20 mL/hr at 11/17/16 2000  . sodium chloride    . sodium chloride 10 mL/hr at 11/17/16 2000  . sodium chloride     PRN Meds:.sodium chloride, sodium chloride, guaiFENesin-dextromethorphan, magnesium hydroxide, metoprolol tartrate, morphine injection, ondansetron (ZOFRAN) IV, oxyCODONE, sodium chloride flush, sodium chloride flush, traMADol  General appearance: alert, cooperative and no distress Neurologic: intact Heart: RRR Lungs: Slightly diminshed at bases Abdomen: Soft, non tender, slight distention, bowel sounds present Extremities: Trace bilateral LE edema Wounds: Clean and dry. No sign of infection.  Lab Results: CBC:  Recent Labs  11/22/16 0500 11/24/16 0251  WBC 6.6 8.8  HGB 9.9* 11.5*  HCT 30.5* 34.8*  PLT 187 279   BMET:   Recent Labs  11/23/16 0229 11/24/16 0251  NA 138 138  K 3.5 3.6  CL 104 103  CO2 27 25  GLUCOSE 56* 113*  BUN 15 12  CREATININE 0.87 0.89  CALCIUM 8.5* 8.7*    CMET: Lab Results  Component Value Date   WBC 8.8 11/24/2016   HGB 11.5 (L) 11/24/2016   HCT 34.8 (L) 11/24/2016   PLT 279 11/24/2016   GLUCOSE 113 (H) 11/24/2016   CHOL 111 11/13/2016   TRIG 244 (H) 11/13/2016   HDL 26 (L) 11/13/2016   LDLCALC 36 11/13/2016   ALT 29 11/23/2016   AST 27 11/23/2016   NA 138 11/24/2016   K 3.6 11/24/2016   CL 103 11/24/2016   CREATININE 0.89 11/24/2016   BUN 12 11/24/2016   CO2 25 11/24/2016   TSH 4.353 11/18/2016   INR 1.07 11/13/2016   HGBA1C 12.0 (H) 11/13/2016   MICROALBUR 2.6 08/06/2015      PT/INR: No results for input(s): LABPROT, INR in the last 72 hours. Radiology: Dg Chest 2 View  Result Date: 11/24/2016 CLINICAL DATA:  CABG. EXAM: CHEST  2 VIEW COMPARISON:  11/23/2016 .   11/12/2016.  04/27/2015 . FINDINGS: Prior CABG. Stable cardiomegaly. Low lung volumes with mild subsegmental atelectasis right upper lobe and both lung bases. Small right pleural effusion. No pneumothorax . IMPRESSION: 1. Prior CABG.  Stable cardiomegaly. 2. Low lung volumes with mild subsegmental atelectasis right upper lobe and both lung bases. Small right pleural effusion. Electronically Signed   By: Maisie Fushomas  Register   On: 11/24/2016 07:37    Assessment/Plan: S/P Procedure(s) (LRB): CORONARY ARTERY BYPASS GRAFTING (CABG) x 5, USING RIGHT INTERNAL MAMMARY ARTERY AND RIGHT GREATER SAPHENOUS VEIN HARVESTED ENDOSCOPICALLY (N/A) TRANSESOPHAGEAL ECHOCARDIOGRAM (TEE) (N/A)  1.CV-Previous a fib with RVR. Had a fib with RVR earlier this am, given Lopressor. SR in the 70's this am. On  Amiodarone 400 mg bid, and Lopressor 25 mg bid.  Patient has PAF and needs anticoagulation.   2.Pulmonary- On room air. CXR this am appears to show low lung volumes, cardiomegaly, no pneumothorax, bibasilar atelectasis and small right pleural effusion. Encourage incentive spirometer and flutter valve. 3.Volume overload-will give Lasix 40 mg PO daily 4.ABL anemia-H and H increased to 11.5 and 34.8 5.DM-CBGs 148/154/115. On Insulin drip. Continue Insulin. Pre op HGA1C 12. Will need close medical follow up after discharge. 6. Supplement potassium 7. Remove EPW so able to start anticoagulation 8. Possibly discharge in am  Elenore RotaZIMMERMAN,DONIELLE M PA-C 11/24/2016 7:53 AM   Start on coumadin- cannot afford NOAC - for postop afib Plan DC in 1-2 days patient examined and medical record reviewed,agree with above note. Kathlee Nationseter Van Trigt III 11/24/2016

## 2016-11-24 NOTE — Progress Notes (Addendum)
CARDIAC REHAB PHASE I   PRE:  Rate/Rhythm: 81 SR  BP:  Sitting: 135/90        SaO2: 92 RA  MODE:  Ambulation: 390 ft   POST:  Rate/Rhythm: 92 SR  BP:  Sitting: 139/71         SaO2: 95 RA  Pt ambulated 390 ft on RA, hand held assist, mostly steady gait, tolerated well, denies any complaints, mild DOE noted, declined rest stop. Pt very anxious, states he is concerned he will not have weekend coverage for 24 hr care at home (states he is working on it), pt inquiring about RW for home, case manager notified. Pt to bed for EPW removal after walk, call bell within reach. Encouraged additional ambulation x2 today. Will follow.   [phone] Joylene GrapesEmily C Jalaysha Skilton, RN, BSN 11/24/2016 10:29 AM

## 2016-11-24 NOTE — Progress Notes (Signed)
Pacing wires removed per order 

## 2016-11-25 DIAGNOSIS — G4733 Obstructive sleep apnea (adult) (pediatric): Secondary | ICD-10-CM

## 2016-11-25 LAB — GLUCOSE, CAPILLARY
Glucose-Capillary: 234 mg/dL — ABNORMAL HIGH (ref 65–99)
Glucose-Capillary: 203 mg/dL — ABNORMAL HIGH (ref 65–99)

## 2016-11-25 LAB — PROTIME-INR
INR: 1.2
Prothrombin Time: 15.3 seconds — ABNORMAL HIGH (ref 11.4–15.2)

## 2016-11-25 MED ORDER — INSULIN GLARGINE 300 UNIT/ML ~~LOC~~ SOPN: 48.0000 [IU] | PEN_INJECTOR | SUBCUTANEOUS | 3 refills | Status: DC

## 2016-11-25 MED ORDER — INSULIN ASPART 100 UNIT/ML FLEXPEN: 8.0000 [IU] | PEN_INJECTOR | Freq: Three times a day (TID) | SUBCUTANEOUS | 3 refills | Status: DC

## 2016-11-25 MED ORDER — INSULIN GLARGINE 100 UNIT/ML ~~LOC~~ SOLN
24.0000 [IU] | Freq: Two times a day (BID) | SUBCUTANEOUS | Status: DC
  Administered 2016-11-25: 24 [IU] via SUBCUTANEOUS
  Filled 2016-11-25: qty 0.24

## 2016-11-25 MED ORDER — CLOTRIMAZOLE 1 % EX CREA: TOPICAL_CREAM | Freq: Two times a day (BID) | CUTANEOUS | 0 refills | Status: DC

## 2016-11-25 MED ORDER — TRAZODONE HCL 50 MG PO TABS: 100.0000 mg | ORAL_TABLET | Freq: Every day | ORAL | Status: DC

## 2016-11-25 MED ORDER — ATORVASTATIN CALCIUM 80 MG PO TABS: 80.0000 mg | ORAL_TABLET | Freq: Every day | ORAL | 1 refills | Status: DC

## 2016-11-25 MED ORDER — ASPIRIN 325 MG PO TBEC: 325.0000 mg | DELAYED_RELEASE_TABLET | Freq: Every day | ORAL | 0 refills | Status: DC

## 2016-11-25 MED ORDER — ACETAMINOPHEN-CODEINE #4 300-60 MG PO TABS: 1.0000 | ORAL_TABLET | Freq: Two times a day (BID) | ORAL | 0 refills | Status: DC | PRN

## 2016-11-25 MED ORDER — RIVAROXABAN 20 MG PO TABS: 20.0000 mg | ORAL_TABLET | Freq: Every day | ORAL | 1 refills | Status: DC

## 2016-11-25 MED ORDER — OXYCODONE HCL 5 MG PO TABS: ORAL_TABLET | ORAL | 0 refills | Status: DC

## 2016-11-25 MED ORDER — METOPROLOL TARTRATE 25 MG PO TABS: 25.0000 mg | ORAL_TABLET | Freq: Two times a day (BID) | ORAL | 1 refills | Status: DC

## 2016-11-25 MED ORDER — AMIODARONE HCL 200 MG PO TABS: 200.0000 mg | ORAL_TABLET | Freq: Two times a day (BID) | ORAL | 1 refills | Status: DC

## 2016-11-25 MED ORDER — ASPIRIN EC 81 MG PO TBEC: 81.0000 mg | DELAYED_RELEASE_TABLET | Freq: Every day | ORAL | Status: DC

## 2016-11-25 NOTE — Care Management Note (Signed)
Case Management Note Previous CM note initiated by [name], [name], RN--11/16/2016, 11:26 AM   Patient Details  Name: [name] [name] MRN: [id number] Date of Birth: 12/01/1960  Subjective/Objective: Pt presented for Chest Pain- s/p cardiac cath 11-12-16 that revealed severe three vessel CAD. Plan for CABG 11-17-16. Pt is from home with girlfriend and she will be home from 8 am-2 pm M-F. Pt will then have support of his sister after 2 pm. PTA- pt was independent. PCP: [name], [name], MD                      Action/Plan: CM will continue to monitor for additional home needs post procedure.   Expected Discharge Date:  11/25/16               Expected Discharge Plan:  Home/Self Care  In-House Referral:     Discharge planning Services  CM Consult, Medication Assistance  Post Acute Care Choice:  Durable medical equipment Choice offered to:  Patient  DME Arranged:   rolling walker DME Agency:   [facility]  HH Arranged:   na HH Agency:   na  Status of Service:  Completed, signed off  If discussed at Long Length of Stay Meetings, dates discussed:    Discharge Disposition: home/self care   Additional Comments:  11/25/16- 0930- [name] RN, CM- received referral for Noacs- benefits check submitted- per PA plan is for Xarelto- spoke with pt at bedside- 30 day free card provided for discharge- per pt he uses [facility]- call made to pharmacy they do have drug in stock to fill today- per pt request- faxed scripts to pharmacy- pt will have to take pain script to pharmacy by hand- per pt he has insulins that fall in the tier 3 that have copay of $45- most likely Xarelto will be $45 also- will let pt know copay when benefit check returns.  Per benefits check copay is $45/mo- pt informed- pt also wants a RW for home- order placed [name]- RW to be delivered to room prior to discharge  [name], [name], RN 11/25/2016,  10:17 AM [phone]

## 2016-11-25 NOTE — Progress Notes (Signed)
CARDIAC REHAB PHASE I   Pt eager for discharge. Cardiac surgery discharge education completed. Reviewed IS, sternal precautions, activity progression, exercise, heart healthy and diabetes diet handouts and phase 2 cardiac rehab. Pt verbalized understanding. Pt agrees to phase 2 cardiac rehab referral, will send to  per pt request. Pt in bed, call bell within reach.   [phone] Joylene GrapesEmily C Colin Norment, RN, BSN 11/25/2016 10:50 AM

## 2016-11-25 NOTE — Progress Notes (Signed)
Progress Note  Patient Name: [name] [name] Date of Encounter: 11/25/2016  Primary Cardiologist: Jens Som  Subjective   Vehement about DC today. No further AFib seen on monitor.  Inpatient Medications    Scheduled Meds: . amiodarone  400 mg Oral Q12H  . aspirin EC  325 mg Oral Daily   Or  . aspirin  324 mg Per Tube Daily  . atorvastatin  80 mg Oral q1800  . bisacodyl  10 mg Oral Daily   Or  . bisacodyl  10 mg Rectal Daily  . Chlorhexidine Gluconate Cloth  6 each Topical Daily  . clotrimazole   Topical BID  . docusate sodium  200 mg Oral Daily  . fentaNYL  25 mcg Transdermal Q72H  . furosemide  40 mg Oral Daily  . gabapentin  600 mg Oral TID  . insulin aspart  0-24 Units Subcutaneous TID AC & HS  . insulin glargine  24 Units Subcutaneous BID  . lidocaine  1 patch Transdermal Q24H  . metoprolol tartrate  25 mg Oral BID  . moving right along book   Does not apply Once  . pantoprazole  40 mg Oral Daily  . potassium chloride  20 mEq Oral Daily  . sodium chloride flush  3 mL Intravenous Q12H  . sodium chloride flush  3 mL Intravenous Q12H  . traZODone  100 mg Oral QHS   Continuous Infusions: . sodium chloride 20 mL/hr at 11/17/16 2000  . sodium chloride    . sodium chloride 10 mL/hr at 11/17/16 2000  . sodium chloride     PRN Meds: sodium chloride, sodium chloride, guaiFENesin-dextromethorphan, magnesium hydroxide, metoprolol tartrate, morphine injection, ondansetron (ZOFRAN) IV, oxyCODONE, sodium chloride flush, sodium chloride flush, traMADol   Vital Signs    Vitals:   11/24/16 2107 11/25/16 0621 11/25/16 0800 11/25/16 0855  BP: 119/66 124/77  132/81  Pulse:    77  Resp: (!) 26 20 16 20   Temp: 99.3 F (37.4 C) 98.6 F (37 C)  99.3 F (37.4 C)  TempSrc:  Oral  Oral  SpO2:  96%  93%  Weight:  206 lb 9.6 oz (93.7 kg)    Height:        Intake/Output Summary (Last 24 hours) at 11/25/16 0954 Last data filed at 11/24/16 2100  Gross per 24 hour  Intake               340 ml  Output                0 ml  Net              340 ml   Filed Weights   11/23/16 0336 11/24/16 0619 11/25/16 0621  Weight: 213 lb 3 oz (96.7 kg) 208 lb 11.2 oz (94.7 kg) 206 lb 9.6 oz (93.7 kg)    Telemetry    NSR - Personally Reviewed  ECG    No new tracing - Personally Reviewed  Physical Exam  Irritable GEN: No acute distress.   Neck: No JVD Cardiac: RRR, no murmurs, rubs, or gallops.  Respiratory: Clear to auscultation bilaterally. GI: Soft, nontender, non-distended  MS: No edema; No deformity. Neuro:  Nonfocal  Psych: Normal affect   Labs    Chemistry Recent Labs Lab 11/18/16 1708  11/22/16 0500 11/23/16 0229 11/24/16 0251  NA  --   < > 137 138 138  K  --   < > 3.7 3.5 3.6  CL  --   < >  106 104 103  CO2  --   < > 26 27 25   GLUCOSE  --   < > 99 56* 113*  BUN  --   < > 15 15 12   CREATININE 0.84  < > 0.77 0.87 0.89  CALCIUM  --   < > 8.3* 8.5* 8.7*  PROT 5.7*  --   --  5.4*  --   ALBUMIN 3.1*  --   --  2.4*  --   AST 52*  --   --  27  --   ALT 59  --   --  29  --   ALKPHOS 49  --   --  103  --   BILITOT 1.7*  --   --  0.9  --   GFRNONAA >60  < > >60 >60 >60  GFRAA >60  < > >60 >60 >60  ANIONGAP  --   < > 5 7 10   < > = values in this interval not displayed.   Hematology Recent Labs Lab 11/20/16 0400 11/22/16 0500 11/24/16 0251  WBC 11.2* 6.6 8.8  RBC 3.52* 3.34* 3.83*  HGB 10.5* 9.9* 11.5*  HCT 32.1* 30.5* 34.8*  MCV 91.2 91.3 90.9  MCH 29.8 29.6 30.0  MCHC 32.7 32.5 33.0  RDW 14.3 14.2 13.9  PLT 148* 187 279     Radiology    Dg Chest 2 View  Result Date: 11/24/2016 CLINICAL DATA:  CABG. EXAM: CHEST  2 VIEW COMPARISON:  11/23/2016 .  11/12/2016.  04/27/2015 . FINDINGS: Prior CABG. Stable cardiomegaly. Low lung volumes with mild subsegmental atelectasis right upper lobe and both lung bases. Small right pleural effusion. No pneumothorax . IMPRESSION: 1. Prior CABG.  Stable cardiomegaly. 2. Low lung volumes with mild  subsegmental atelectasis right upper lobe and both lung bases. Small right pleural effusion. Electronically Signed   By: Maisie Fushomas  Register   On: 11/24/2016 07:37    Patient Profile     56 y.o. male s/p CABG 0n 11/18/16, with multiple episodes of postop atrial fibrillation, resolved on amiodarone, history of [name].  Assessment & Plan    1. AFib: multiple recurrent episodes, but none in 24 h. Multiple risk factors for embolic events - CHADSVasc 3 (DM, CAD, HTN). Recommend anticoagulation after discharge (e.g. Xarelto 20 mg daily). Continue amiodarone at 400 mg daily for one week, then 200 mg daily maintenance dose for about 30 days postop. If atrial fibrillation does not recur in subsequent 2-3 months, confirmed with 30 day monitor, may consider stopping anticoagulation. 2. CAD s/p CABG: Probable for DC today 3. Hx of [name]: Would recommend at a minimum that he has a repeat sleep study. Discussed relationship between [name] and burden of atrial fibrillation. 4. HTN: BP controlled with metoprolol.  Signed, Thurmon FairMihai Quinlyn Tep, MD  11/25/2016, 9:54 AM

## 2016-11-25 NOTE — Progress Notes (Signed)
Patient and wife given discharge instructions, medication list and paper prescriptions. All questions were answered IV and tele were dcd will discharge home as ordered. Jaylinn Hellenbrand, Randall AnKristin Jessup RN

## 2016-11-25 NOTE — Progress Notes (Signed)
Per [employer] check for [employer] # 1. [facility] [employer] # [id number]   1. XARELTO  20 MG DAILY   COVER- YES  CO-PAY- $ 45.00  TIER- 3 DRUG  PRIOR APPROVAL- NO   2. ELIQUIS 5 MG BID   COVER- YES  C-PAY- $ 45.00  TIER- 3 DRUG  PRIOR APPROVAL- NO    PHARMACY : [facility]

## 2016-11-25 NOTE — Progress Notes (Signed)
CT removed as ordered and steri strips applied. Sholonda Jobst, Randall AnKristin Jessup RN

## 2016-11-25 NOTE — Progress Notes (Addendum)
[address]            [place] [id number]          [phone]   8 Days Post-Op Procedure(s) (LRB): CORONARY ARTERY BYPASS GRAFTING (CABG) x 5, USING RIGHT INTERNAL MAMMARY ARTERY AND RIGHT GREATER SAPHENOUS VEIN HARVESTED ENDOSCOPICALLY (N/A) TRANSESOPHAGEAL ECHOCARDIOGRAM (TEE) (N/A)  Total Length of Stay:  LOS: 12 days   Subjective: Patient VERY upset because he wants to go home today.  Objective: Vital signs in last 24 hours: Temp:  [98.6 F (37 C)-99.3 F (37.4 C)] 98.6 F (37 C) (07/18 0621) Pulse Rate:  [84] 84 (07/17 1206) Cardiac Rhythm: Heart block (07/18 0700) Resp:  [16-26] 20 (07/18 0621) BP: (91-126)/(53-80) 124/77 (07/18 0621) SpO2:  [96 %] 96 % (07/18 0621) Weight:  [93.7 kg (206 lb 9.6 oz)] 93.7 kg (206 lb 9.6 oz) (07/18 0621)  Filed Weights   11/23/16 0336 11/24/16 0619 11/25/16 [id number]  Weight: 96.7 kg (213 lb 3 oz) 94.7 kg (208 lb 11.2 oz) 93.7 kg (206 lb 9.6 oz)    Weight change: -0.953 kg (-2 lb 1.6 oz)      Intake/Output from previous day: 07/17 0701 - 07/18 0700 In: 340 [P.O.:240] Out: -   Intake/Output this shift: No intake/output data recorded.  Current Meds: Scheduled Meds: . amiodarone  400 mg Oral Q12H  . aspirin EC  325 mg Oral Daily   Or  . aspirin  324 mg Per Tube Daily  . atorvastatin  80 mg Oral q1800  . bisacodyl  10 mg Oral Daily   Or  . bisacodyl  10 mg Rectal Daily  . Chlorhexidine Gluconate Cloth  6 each Topical Daily  . clotrimazole   Topical BID  . docusate sodium  200 mg Oral Daily  . fentaNYL  25 mcg Transdermal Q72H  . furosemide  40 mg Oral Daily  . gabapentin  600 mg Oral TID  . insulin aspart  0-24 Units Subcutaneous TID AC & HS  . insulin glargine  20 Units Subcutaneous BID  . lidocaine  1 patch Transdermal Q24H  . metoprolol tartrate  25 mg Oral BID  . moving right along book   Does not apply Once  . pantoprazole  40 mg Oral Daily  . potassium chloride  20 mEq Oral  Daily  . sodium chloride flush  3 mL Intravenous Q12H  . sodium chloride flush  3 mL Intravenous Q12H  . traZODone  100 mg Oral QHS  . warfarin  5 mg Oral q1800  . Warfarin - Physician Dosing Inpatient   Does not apply q1800   Continuous Infusions: . sodium chloride 20 mL/hr at 11/17/16 2000  . sodium chloride    . sodium chloride 10 mL/hr at 11/17/16 2000  . sodium chloride     PRN Meds:.sodium chloride, sodium chloride, guaiFENesin-dextromethorphan, magnesium hydroxide, metoprolol tartrate, morphine injection, ondansetron (ZOFRAN) IV, oxyCODONE, sodium chloride flush, sodium chloride flush, traMADol  General appearance: alert, cooperative and no distress Neurologic: intact Heart: RRR Lungs: Slightly diminshed at bases Abdomen: Soft, non tender, slight distention, bowel sounds present Extremities: Trace bilateral LE edema Wounds: Clean and dry. No sign of infection.  Lab Results: CBC:  Recent Labs  11/24/16 0251  WBC 8.8  HGB 11.5*  HCT 34.8*  PLT 279   BMET:   Recent Labs  11/23/16 0229 11/24/16 0251  NA  138 138  K 3.5 3.6  CL 104 103  CO2 27 25  GLUCOSE 56* 113*  BUN 15 12  CREATININE 0.87 0.89  CALCIUM 8.5* 8.7*    CMET: Lab Results  Component Value Date   WBC 8.8 11/24/2016   HGB 11.5 (L) 11/24/2016   HCT 34.8 (L) 11/24/2016   PLT 279 11/24/2016   GLUCOSE 113 (H) 11/24/2016   CHOL 111 11/13/2016   TRIG 244 (H) 11/13/2016   HDL 26 (L) 11/13/2016   LDLCALC 36 11/13/2016   ALT 29 11/23/2016   AST 27 11/23/2016   NA 138 11/24/2016   K 3.6 11/24/2016   CL 103 11/24/2016   CREATININE 0.89 11/24/2016   BUN 12 11/24/2016   CO2 25 11/24/2016   TSH 4.353 11/18/2016   INR 1.20 11/25/2016   HGBA1C 12.0 (H) 11/13/2016   MICROALBUR 2.6 08/06/2015      PT/INR:   Recent Labs  11/25/16 0252  LABPROT 15.3*  INR 1.20   Radiology: No results found.  Assessment/Plan: S/P Procedure(s) (LRB): CORONARY ARTERY BYPASS GRAFTING (CABG) x 5, USING  RIGHT INTERNAL MAMMARY ARTERY AND RIGHT GREATER SAPHENOUS VEIN HARVESTED ENDOSCOPICALLY (N/A) TRANSESOPHAGEAL ECHOCARDIOGRAM (TEE) (N/A)  1.CV-Previous a fib with RVR. Had a fib with RVR earlier this am, given Lopressor. SR in the 70's this am. On  Amiodarone 400 mg bid, and Lopressor 25 mg bid.  Patient has PAF and needs anticoagulation. Will decrease ecasa to 81 mg daily.  2.Pulmonary- On room air. CXR this am appears to show low lung volumes, cardiomegaly, no pneumothorax, bibasilar atelectasis and small right pleural effusion. Encourage incentive spirometer and flutter valve. 3.Volume overload-will give Lasix 40 mg PO daily 4.ABL anemia-H and H increased to 11.5 and 34.8 5.DM-CBGs 190/223/234. On Insulin. Continue Insulin. Pre op HGA1C 12. Will need close medical follow up after discharge. 6. As discussed with Dr. Royann Shivers, will give patient 30 day free card of Xarelto. He will NOT be on Coumadin. Discussed also, with Dr. Donata Clay, and is ok to discharge home.  ZIMMERMAN,DONIELLE M PA-C 11/25/2016 8:29 AM   Plan short course of oral Xarelto for intermittent postop A. Fib Patient ready for discharge today-discharge instructions and plan for follow-up reviewed with patient  patient examined and medical record reviewed,agree with above note. Kathlee Nations Trigt III 11/25/2016

## 2016-12-02 ENCOUNTER — Other Ambulatory Visit: Payer: Self-pay | Admitting: Physician Assistant

## 2016-12-02 ENCOUNTER — Other Ambulatory Visit: Payer: Self-pay | Admitting: *Deleted

## 2016-12-02 DIAGNOSIS — G8918 Other acute postprocedural pain: Secondary | ICD-10-CM

## 2016-12-02 DIAGNOSIS — E0800 Diabetes mellitus due to underlying condition with hyperosmolarity without nonketotic hyperglycemic-hyperosmolar coma (NKHHC): Secondary | ICD-10-CM

## 2016-12-02 MED ORDER — INSULIN LISPRO 100 UNIT/ML (KWIKPEN): 8.0000 [IU] | PEN_INJECTOR | Freq: Three times a day (TID) | SUBCUTANEOUS | 11 refills | Status: DC

## 2016-12-02 MED ORDER — OXYCODONE HCL 5 MG PO TABS: ORAL_TABLET | ORAL | 0 refills | Status: DC

## 2016-12-02 NOTE — Discharge Summary (Signed)
Physician Discharge Summary       [name]       [name] [id number]             [id number]    Patient ID: [name] [name] MRN: [id number] DOB/AGE: [birth year] 56 y.o.  Admit date: 11/12/2016 Discharge date: 11/25/2016  Admission Diagnoses: 1. Unstable angina ([name]) 2. Coronary artery disease  Active Diagnoses:  1. Myocardial infarction ([name]) 2. [name] (obstructive sleep apnea) 3. DM type 2, uncontrolled, with neuropathy ([name]) 4. Chronic low back pain 5. Hypertension associated with diabetes ([name]) 6. Hyperlipidemia associated with type 2 diabetes mellitus ([name]) 7. Depression 8. GERD (gastroesophageal reflux disease) 9. Fibromyalgia 10. History of hiatal hernia 11.Chronic lower back pain 12. Anxiety 13. ABL anemia  Consults: cardiology  Procedure (s):  Left Heart Cath and Coronary Angiography by Dr. Gwenlyn [name] on 11/12/2016:  Conclusion     Ost RCA to Dist RCA lesion, 80 %stenosed.  Mid RCA to Dist RCA lesion, 100 %stenosed.  Prox LAD to Mid LAD lesion, 75 %stenosed.  Mid LAD to Dist LAD lesion, 75 %stenosed.  Ost 1st Mrg to 1st Mrg lesion, 50 %stenosed.  2nd Mrg lesion, 99 %stenosed.  Ost Ramus to Ramus lesion, 75 %stenosed.  Ost 1st Diag to 1st Diag lesion, 80 %stenosed.  The left ventricular systolic function is normal.  LV end diastolic pressure is normal.  The left ventricular ejection fraction is 55-65% by visual estimate.   [name] is a 56 y.o. male    [id number] LOCATION:  [name]: [place]  PHYSICIAN: [name], M.D. 09-15-1960       1. Coronary artery bypass grafting x5 (left internal mammary artery to     left anterior descending, saphenous vein graft to diagonal,     saphenous vein graft to obtuse marginal, saphenous vein graft to     posterior descending-posterior lateral). 2. Endoscopic harvest of right leg greater saphenous vein. 3. Placed in the right femoral A-line by Dr. Prescott [name] on  11/17/2016.  History of Presenting Illness: This is a 56 year old Caucasian male admitted in transfer from [name] with acute chest pain at 3 AM which radiated to his left shoulder and was associated with nausea. No associated diaphoresis, nausea, or shortness of breath. Patient has history of a drug-eluting stent 2005 placed this hospital. He has not been seen by a cardiologist in several years.  The patient was evaluated at [name] or cardiac enzymes are negative. EKG showed sinus rhythm with no acute changes. [facility]  The patient underwent cardiac catheterization by Dr. Quay [name] which demonstrated occlusion of his RCA stent, reconstitution of the posterior descending and severe coronary disease of his LAD, diagonal, ramus, and circumflex marginal. LV function is fairly well preserved and echocardiogram showed no significant valvular disease. LVEDP was 10. The patient is currently comfortable, sleeping without pain on IV heparin.  The patient's cardiologist has recommended multivessel cord bypass grafting as therapy for his severe recurrent CAD with unstable angina and preserved LV function.  The patient's chest x-ray on admission was clear. He has hyperlipidemia and type 2 diabetes. He has history of [name] but stopped wearing his mask after having lost significant weight.  Dr. Prescott [name] discussed the need for coronary artery bypass grafting surgery. Potential risks, benefits, and complications were discussed with the patient and he agreed to proceed with surgery. Pre operative carotid duplex showed no significant internal carotid artery stenosis bilaterally. ABI's were 1.29 on  the right and 1.32 on the left. He underwent a CABG x 5 on 11/17/2016.  Brief Hospital Course:  The patient was extubated early the morning of post operative day one  without difficulty. He was weaned off of Milrinone and Neo synephrine drips. He remained afebrile and  hemodynamically stable. [name], a line, chest tubes, and foley were removed early in the post operative course. He did have sternal incisional pain, which took a few days to control. He ultimately was put on a Duragesic patch, but was informed he would not go home on this. Lopressor was started and titrated accordingly. He did go into a fib with RVR. He was put on an Amiodarone drip. He later converted to sinus rhythm and was put on oral Amiodarone. Ultimately, his rhythm was PAF. He required anticoagulation. He was put on Xarelto and given a free 30 day supply card. He was volume over loaded and diuresed. He had ABL anemia. He did not require a post op transfusion. Last H and H was 11.5 and 34.8. He was weaned off the insulin drip.  The patient's glucose remained well controlled.The patient's HGA1C pre op was 12. He will require close medical follow up after discharge. The patient was felt surgically stable for transfer from the ICU to [facility] for further convalescence on 11/21/2016. He continues to progress with cardiac rehab. [name] ambulating on room air. He has been tolerating a diet and has had a bowel movement. Epicardial pacing wires were removed on 11/24/2016. Chest tube sutures will be removed the day of discharge. The patient is felt surgically stable for discharge today.   Latest Vital Signs: Blood pressure 117/89, pulse 81, temperature 99.3 F (37.4 C), temperature source Oral, resp. rate 20, height '5\' 11"'  (1.803 m), weight 93.7 kg (206 lb 9.6 oz), SpO2 93 %.  Physical Exam: General appearance: alert, cooperative and no distress Neurologic: intact Heart: RRR Lungs: Slightly diminshed at bases Abdomen: Soft, non tender, slight distention, bowel sounds present Extremities: Trace bilateral LE edema Wounds: Clean and dry. No sign of infection.  Discharge Condition: Stable and discharged to home.  Recent laboratory studies:  Lab Results  Component Value Date   WBC 8.8 11/24/2016   HGB  11.5 (L) 11/24/2016   HCT 34.8 (L) 11/24/2016   MCV 90.9 11/24/2016   PLT 279 11/24/2016   Lab Results  Component Value Date   NA 138 11/24/2016   K 3.6 11/24/2016   CL 103 11/24/2016   CO2 25 11/24/2016   CREATININE 0.89 11/24/2016   GLUCOSE 113 (H) 11/24/2016    Diagnostic Studies: Dg Chest 2 View  Result Date: 11/24/2016 CLINICAL DATA:  CABG. EXAM: CHEST  2 VIEW COMPARISON:  11/23/2016 .  11/12/2016.  04/27/2015 . FINDINGS: Prior CABG. Stable cardiomegaly. Low lung volumes with mild subsegmental atelectasis right upper lobe and both lung bases. Small right pleural effusion. No pneumothorax . IMPRESSION: 1. Prior CABG.  Stable cardiomegaly. 2. Low lung volumes with mild subsegmental atelectasis right upper lobe and both lung bases. Small right pleural effusion. Electronically Signed   By: Marcello Moores  Register   On: 11/24/2016 07:37    Discharge Instructions    AMB Referral to Cardiac Rehabilitation - Phase II    Complete by:  As directed    Diagnosis:  Stable Angina   Amb Referral to Cardiac Rehabilitation    Complete by:  As directed    Diagnosis:  CABG Comment - To [place]   CABG X ___:  5  Discharge Medications: Allergies as of 11/25/2016      Reactions   No Known Allergies       Medication List    STOP taking these medications   lisinopril 40 MG tablet Commonly known as:  PRINIVIL,ZESTRIL   predniSONE 10 MG tablet Commonly known as:  DELTASONE     TAKE these medications   ACCU-CHEK FASTCLIX LANCETS Misc 1 each by Does not apply route 3 (three) times daily.   TRUEPLUS LANCETS 28G Misc 1 each by Does not apply route 3 (three) times daily.   acetaminophen-codeine 300-60 MG tablet Commonly known as:  TYLENOL #4 Take 1 tablet by mouth every 12 (twelve) hours as needed for moderate pain. What changed:  when to take this  reasons to take this   amiodarone 200 MG tablet Commonly known as:  PACERONE Take 1 tablet (200 mg total) by mouth every 12 (twelve)  hours. For one week;then take Amiodarone 200 mg daily thereafter.   aspirin EC 81 MG tablet Take 81 mg by mouth daily. Reported on 08/06/2015   atorvastatin 80 MG tablet Commonly known as:  LIPITOR Take 1 tablet (80 mg total) by mouth daily at 6 PM. What changed:  medication strength  how much to take  when to take this   clotrimazole 1 % cream Commonly known as:  LOTRIMIN Apply topically 2 (two) times daily.   gabapentin 300 MG capsule Commonly known as:  NEURONTIN TAKE 2 CAPSULES BY MOUTH 3 TIMES DAILY FOR LOW BACK PAIN AND NEUROPATHY   glucose blood test strip Commonly known as:  TRUE METRIX BLOOD GLUCOSE TEST 1 each by Other route 3 (three) times daily.   insulin aspart 100 UNIT/ML FlexPen Commonly known as:  NOVOLOG Inject 8 Units into the skin 3 (three) times daily with meals. What changed:  how much to take   Insulin Glargine 300 UNIT/ML Sopn Commonly known as:  TOUJEO SOLOSTAR Inject 48 Units into the skin every morning. What changed:  how much to take   Insulin Pen Needle 32G X 4 MM Misc Commonly known as:  INSUPEN PEN NEEDLES 1 each by Does not apply route daily.   metoprolol tartrate 25 MG tablet Commonly known as:  LOPRESSOR Take 1 tablet (25 mg total) by mouth 2 (two) times daily.   oxyCODONE 5 MG immediate release tablet Commonly known as:  Oxy IR/ROXICODONE Take 5 mg by mouth every 4-6 hours PRN severe pain.   rivaroxaban 20 MG Tabs tablet Commonly known as:  XARELTO Take 1 tablet (20 mg total) by mouth daily with supper.   traZODone 50 MG tablet Commonly known as:  DESYREL Take 2 tablets (100 mg total) by mouth at bedtime.   TRUE METRIX METER w/Device Kit 1 each by Does not apply route as needed.     The patient has been discharged on:   1.Beta Blocker:  Yes [ x  ]                              No   [   ]                              If No, reason:  2.Ace Inhibitor/ARB: Yes [   ]  No  [  x  ]                                      If No, reason: Labile BP but hope to start after discharge.  3.Statin:   Yes [ x  ]                  No  [   ]                  If No, reason:  4.Ecasa:  Yes  [  x ]                  No   [   ]                  If No, reason:  Follow Up Appointments: Follow-up Information    Leonard Downing, MD Follow up.   Specialty:  Family Medicine Why:  Call for a follow up appointment regarding further management of diabetes and surveillance [facility] Contact information: [name] [address] [phone]        Ivin Poot, MD Follow up on 12/30/2016.   Specialty:  Cardiothoracic Surgery Why:  PA/LAT CXR to be taken (at [facility] which is in the same building as Dr. Lucianne Lei Trigt's office on the ground floor) on 12/30/2016 at 1:00;Appointment time is at 1:30 pm Contact information: [place] [address] [phone]        Almyra Deforest, [place] Follow up on 12/09/2016.   Specialties:  Cardiology, Radiology Why:  Appointment time is at 9:30 am Contact information: [place] [address] [place] Follow up.   Why:  rolling walker- arranged- to be delivered to room proir to discharge Contact information: [address] [place] [address] [phone]           Signed: Lars Pinks MPA-C 12/02/2016, 7:48 AM

## 2016-12-09 ENCOUNTER — Ambulatory Visit (INDEPENDENT_AMBULATORY_CARE_PROVIDER_SITE_OTHER): Payer: Medicare Other | Admitting: Physician Assistant

## 2016-12-09 ENCOUNTER — Encounter: Payer: Self-pay | Admitting: Physician Assistant

## 2016-12-09 VITALS — BP 112/68 | HR 67 | Ht 70.0 in | Wt 206.0 lb

## 2016-12-09 DIAGNOSIS — E785 Hyperlipidemia, unspecified: Secondary | ICD-10-CM | POA: Diagnosis not present

## 2016-12-09 DIAGNOSIS — E119 Type 2 diabetes mellitus without complications: Secondary | ICD-10-CM

## 2016-12-09 DIAGNOSIS — G4733 Obstructive sleep apnea (adult) (pediatric): Secondary | ICD-10-CM | POA: Diagnosis not present

## 2016-12-09 DIAGNOSIS — E781 Pure hyperglyceridemia: Secondary | ICD-10-CM

## 2016-12-09 DIAGNOSIS — Z794 Long term (current) use of insulin: Secondary | ICD-10-CM

## 2016-12-09 DIAGNOSIS — I25810 Atherosclerosis of coronary artery bypass graft(s) without angina pectoris: Secondary | ICD-10-CM | POA: Diagnosis not present

## 2016-12-09 DIAGNOSIS — I480 Paroxysmal atrial fibrillation: Secondary | ICD-10-CM

## 2016-12-09 MED ORDER — FISH OIL 1000 MG PO CAPS: 1000.0000 mg | ORAL_CAPSULE | Freq: Two times a day (BID) | ORAL | 11 refills | Status: DC

## 2016-12-09 NOTE — Progress Notes (Signed)
Cardiology Office Note    Date:  12/10/2016   ID:  [name] [name], DOB November 25, 1960, MRN [id number]  PCP:  [name] Downing, MD  Cardiologist:  Dr. Stanford Breed   Chief Complaint  Patient presents with  . Hospitalization Follow-up    seen for Dr. Stanford Breed, s/p CABG    History of Present Illness:  [name] [name] is a 56 y.o. male with PMH of HTN, HLD, GERD, [name], DM II, and CAD s/p recent bypass surgery. He has a history of coronary artery disease with 3 overlapping DES to RCA in 2005. He was recently admitted to the hospital on 11/12/2016 with chest pain concerning for unstable angina. Serial troponin was negative. He underwent cardiac catheterization on 11/12/2016 which showed 100% occluded mid RCA, 80% ostial RCA, 75% proximal LAD, 75% ostial ramus, 80% ostial D1, 99% OM 2 lesion. EF 55-65%. CT surgery service was consulted. Echocardiogram obtained on the same day showed EF 37-62%, grade 2 diastolic dysfunction. Pre-bypass workup included carotid ultrasound which showed only 1-59% disease. He eventually underwent CABG 4 with LIMA to LAD, SVG to ramus intermediate, SVG to OM, sequential SVG to PDA and PLB by Dr. Prescott Gum. Postop, he did have paroxysmal atrial fibrillation and was placed on amiodarone. Given elevated CHA2DS2-Vasc score, he was discharged on 78m daily of Xarelto. He will continue amiodarone for one month. It is also planned for him to have outpatient 30 day event monitor, if he does not have any recurrence of atrial fibrillation, may consider an systemic anticoagulation afterward.  Patient presents today for cardiology office visit. Based on EKG, he is maintaining sinus rhythm. He denies any cardiac awareness of atrial fibrillation even in the hospital. He has been doing well, he has a soreness over the left chest which is relieved by pressure. Otherwise he has been doing well. He does have a tenderness right above his right lower extremity vein harvesting area, the area does feels  tender and swollen, I will defer to Dr. VLucianne LeiTrigt's team to decide whether or not to obtain a venous Doppler of the area. Otherwise, the current plan is to finish his 1 month worth of amiodarone and stop amiodarone around August 18. That amiodarone washout until September 18 and put him on a 30 day event monitor. He will return to see Dr. CStanford Breednear early November, Dr. CStanford Breedwill decide whether or not to take him off of Xarelto depend on monitor result. It is since he is currently not taking aspirin, I have resumed aspirin on him. At some point, he may also need repeat sleep study as well    Past Medical History:  Diagnosis Date  . Anxiety   . Chronic lower back pain   . Coronary artery disease 03/2004    3 stents put in   . Depression   . Fibromyalgia   . GERD (gastroesophageal reflux disease)   . History of hiatal hernia   . Hyperlipidemia LDL goal <70   . Hypertension   . Migraine    "weekly; back in the 1990s" (11/12/2016)  . [place]  . [name] (obstructive sleep apnea)    "quit wearing my mask" (11/12/2016)  . [place] II [place] [place]    Past Surgical History:  Procedure Laterality Date  . CARDIAC CATHETERIZATION  11/12/2016  . CORONARY ANGIOPLASTY WITH STENT PLACEMENT  03/2004    x 3 stents  . CORONARY ARTERY BYPASS GRAFT N/A 11/17/2016   Procedure: CORONARY ARTERY BYPASS  GRAFTING (CABG) x 5, USING RIGHT INTERNAL MAMMARY ARTERY AND RIGHT GREATER SAPHENOUS VEIN HARVESTED ENDOSCOPICALLY;  Surgeon: Ivin Poot, MD;  Location: [place];  Service: Open Heart Surgery;  Laterality: N/A;  . INGUINAL HERNIA REPAIR Right 2000  . LEFT HEART CATH AND CORONARY ANGIOGRAPHY N/A 11/12/2016   Procedure: Left Heart Cath and Coronary Angiography;  Surgeon: Lorretta Harp, MD;  Location: [facility];  Service: Cardiovascular;  Laterality: N/A;  . TEE WITHOUT CARDIOVERSION N/A 11/17/2016   Procedure: TRANSESOPHAGEAL ECHOCARDIOGRAM (TEE);  Surgeon: Prescott Gum, Collier Salina, MD;  Location: [place];  Service: Open Heart Surgery;  Laterality: N/A;    Current Medications: Outpatient Medications Prior to Visit  Medication Sig Dispense Refill  . ACCU-CHEK FASTCLIX LANCETS MISC 1 each by Does not apply route 3 (three) times daily. 100 each 3  . acetaminophen-codeine (TYLENOL #4) 300-60 MG tablet Take 1 tablet by mouth every 12 (twelve) hours as needed for moderate pain. 30 tablet 0  . amiodarone (PACERONE) 200 MG tablet Take 1 tablet (200 mg total) by mouth every 12 (twelve) hours. For one week;then take Amiodarone 200 mg daily thereafter. 60 tablet 1  . aspirin EC 81 MG tablet Take 81 mg by mouth daily. Reported on 08/06/2015    . atorvastatin (LIPITOR) 80 MG tablet Take 1 tablet (80 mg total) by mouth daily at 6 PM. 30 tablet 1  . Blood Glucose Monitoring Suppl (TRUE METRIX METER) w/Device KIT 1 each by Does not apply route as needed. 1 kit 0  . clotrimazole (LOTRIMIN) 1 % cream Apply topically 2 (two) times daily. 30 g 0  . gabapentin (NEURONTIN) 300 MG capsule TAKE 2 CAPSULES BY MOUTH 3 TIMES DAILY FOR LOW BACK PAIN AND NEUROPATHY 180 capsule 0  . glucose blood (TRUE METRIX BLOOD GLUCOSE TEST) test strip 1 each by Other route 3 (three) times daily. 100 each 11  . Insulin Glargine (TOUJEO SOLOSTAR) 300 UNIT/ML SOPN Inject 48 Units into the skin every morning. 9 mL 3  . insulin lispro (HUMALOG KWIKPEN) 100 UNIT/ML KiwkPen Inject 0.08 mLs (8 Units total) into the skin 3 (three) times daily with meals. 15 mL 11  . Insulin Pen Needle (INSUPEN PEN NEEDLES) 32G X 4 MM MISC 1 each by Does not apply route daily. 120 each 5  . metoprolol tartrate (LOPRESSOR) 25 MG tablet Take 1 tablet (25 mg total) by mouth 2 (two) times daily. 60 tablet 1  . oxyCODONE (OXY IR/ROXICODONE) 5 MG immediate release tablet Take 5 mg by mouth every 4-6 hours PRN severe pain. 40 tablet 0  . rivaroxaban (XARELTO) 20 MG TABS tablet Take 1 tablet (20 mg total) by mouth daily with supper. 30  tablet 1  . traZODone (DESYREL) 50 MG tablet Take 2 tablets (100 mg total) by mouth at bedtime.    . TRUEPLUS LANCETS 28G MISC 1 each by Does not apply route 3 (three) times daily. 100 each 11   No facility-administered medications prior to visit.      Allergies:   No known allergies   Social History   Social History  . Marital status: Divorced    Spouse name: N/A  . Number of children: 1   . Years of education: 31    Occupational History  . Unemployed     Social History Main Topics  . Smoking status: Never Smoker  . Smokeless tobacco: Never Used  . Alcohol use 3.6 oz/week    6 Cans of beer per  week  . Drug use: No  . Sexual activity: No   Other Topics Concern  . None   Social History Narrative   Lives alone.    Daughter lives nearby,5 miles away   Sister next door.    1 grandchild, 51 yo granddaughter      Family History:  The patient's family history includes Diabetes in his father, paternal grandfather, and sister.   ROS:   Please see the history of present illness.    ROS All other systems reviewed and are negative.   PHYSICAL EXAM:   VS:  BP 112/68   Pulse 67   Ht '5\' 10"'  (1.778 m)   Wt 206 lb (93.4 kg)   SpO2 96%   BMI 29.56 kg/m    GEN: Well nourished, well developed, in no acute distress  HEENT: normal  Neck: no JVD, carotid bruits, or masses Cardiac: RRR; no murmurs, rubs, or gallops,no edema  Respiratory:  clear to auscultation bilaterally, normal work of breathing GI: soft, nontender, nondistended, + BS MS: no deformity or atrophy  Skin: warm and dry, no rash Neuro:  Alert and Oriented x 3, Strength and sensation are intact Psych: euthymic mood, full affect  Wt Readings from Last 3 Encounters:  12/09/16 206 lb (93.4 kg)  11/25/16 206 lb 9.6 oz (93.7 kg)  11/15/15 202 lb 3.2 oz (91.7 kg)      Studies/Labs Reviewed:   EKG:  EKG is ordered today.  The ekg ordered today demonstrates  Normal sinus rhythm without significant ST-T wave  changes  Recent Labs: 11/18/2016: Magnesium 2.4; TSH 4.353 11/23/2016: ALT 29 11/24/2016: BUN 12; Creatinine, Ser 0.89; Hemoglobin 11.5; Platelets 279; Potassium 3.6; Sodium 138   Lipid Panel    Component Value Date/Time   CHOL 111 11/13/2016 0359   TRIG 244 (H) 11/13/2016 0359   HDL 26 (L) 11/13/2016 0359   CHOLHDL 4.3 11/13/2016 0359   VLDL 49 (H) 11/13/2016 0359   LDLCALC 36 11/13/2016 0359    Additional studies/ records that were reviewed today include:   Cath 11/12/2016 Conclusion     Ost RCA to Dist RCA lesion, 80 %stenosed.  Mid RCA to Dist RCA lesion, 100 %stenosed.  Prox LAD to Mid LAD lesion, 75 %stenosed.  Mid LAD to Dist LAD lesion, 75 %stenosed.  Ost 1st Mrg to 1st Mrg lesion, 50 %stenosed.  2nd Mrg lesion, 99 %stenosed.  Ost Ramus to Ramus lesion, 75 %stenosed.  Ost 1st Diag to 1st Diag lesion, 80 %stenosed.  The left ventricular systolic function is normal.  LV end diastolic pressure is normal.  The left ventricular ejection fraction is 55-65% by visual estimate.    Echo 11/12/2016 LV EF: 60% -   65%  ------------------------------------------------------------------- Indications:      Chest pain 786.51.  ------------------------------------------------------------------- History:   Risk factors:  Diabetes mellitus. Dyslipidemia.  ------------------------------------------------------------------- Study Conclusions  - Left ventricle: The cavity size was normal. Wall thickness was   increased in a pattern of mild LVH. Systolic function was normal.   The estimated ejection fraction was in the range of 60% to 65%.   Wall motion was normal; there were no regional wall motion   abnormalities. Features are consistent with a pseudonormal left   ventricular filling pattern, with concomitant abnormal relaxation   and increased filling pressure (grade 2 diastolic dysfunction).    ASSESSMENT:    1. Coronary artery disease involving coronary  bypass graft of native heart without angina pectoris   2. Hypertriglyceridemia  3. Paroxysmal atrial fibrillation (HCC)   4. Hyperlipidemia, unspecified hyperlipidemia type   5. [name] (obstructive sleep apnea)   6. Controlled type 2 diabetes mellitus without complication, with long-term current use of insulin (HCC)      PLAN:  In order of problems listed above:  1. CAD s/p CABG: Denies any significant chest discomfort other than a soreness over the left chest relieved by pressure. No obvious angina symptom. He is currently not taking aspirin, we will restarted.  2. Paroxysmal atrial fibrillation: Occurred after bypass surgery. Currently on amiodarone, the plan is to finish one month of amiodarone, allow amiodarone to wash out. Then obtain outpatient 30 day event monitor, if he does not have any recurrence of atrial fibrillation, may consider discontinuation of Xarelto.  3. Hypertriglyceridemia: Add OTC fish oil, repeat a fasting lipid panel in 6-8 weeks, he still uncontrolled, will try Rx strength Lovaza or Vascepa. Continue lipitor. Recent lipid panel showed well-controlled total total cholesterol and LDL level, triglyceride 244.  4. [name]: May need repeat sleep study  5. DM 2: On insulin, will defer to primary care physician for management    Medication Adjustments/Labs and Tests Ordered: Current medicines are reviewed at length with the patient today.  Concerns regarding medicines are outlined above.  Medication changes, Labs and Tests ordered today are listed in the Patient Instructions below. Patient Instructions  Medication Instructions:   STOP Amiodarone (continue to take until you run out, approximately mid-August)  CONTINUE aspirin, Xarelto and other medications as instructed.  ADD over-the-counter fish oil (1 gram capsule two times daily.)   Labwork:   Fasting lipid profile when you return in November.  Testing/Procedures:  Your physician has recommended that you wear  a 30 day event monitor. This should be initiated 1 month after your last dose of amiodarone.   Event monitors are medical devices that record the heart's electrical activity. Doctors most often [place] these monitors to diagnose arrhythmias. Arrhythmias are problems with the speed or rhythm of the heartbeat. The monitor is a small, portable device. You can wear one while you do your normal daily activities. This is usually used to diagnose what is causing palpitations/syncope (passing out).    Follow-Up:  With Dr. Stanford Breed around the beginning of November, following the completion of the 30 day event monitor.  If you need a refill on your cardiac medications before your next appointment, please call your pharmacy.      Hilbert Corrigan, [place]  12/10/2016 11:13 PM    [facility] [place], [place], [place]  [address] Phone: [phone]; Fax: [phone]

## 2016-12-09 NOTE — Patient Instructions (Signed)
Medication Instructions:   STOP Amiodarone (continue to take until you run out, approximately mid-August)  CONTINUE aspirin, Xarelto and other medications as instructed.  ADD over-the-counter fish oil (1 gram capsule two times daily.)   Labwork:   Fasting lipid profile when you return in November.  Testing/Procedures:  Your physician has recommended that you wear a 30 day event monitor. This should be initiated 1 month after your last dose of amiodarone.   Event monitors are medical devices that record the heart's electrical activity. Doctors most often us these monitors to diagnose arrhythmias. Arrhythmias are problems with the speed or rhythm of the heartbeat. The monitor is a small, portable device. You can wear one while you do your normal daily activities. This is usually used to diagnose what is causing palpitations/syncope (passing out).    Follow-Up:  With Dr. Jens Somrenshaw around the beginning of November, following the completion of the 30 day event monitor.  If you need a refill on your cardiac medications before your next appointment, please call your pharmacy.

## 2016-12-10 ENCOUNTER — Encounter: Payer: Self-pay | Admitting: Physician Assistant

## 2016-12-29 ENCOUNTER — Other Ambulatory Visit: Payer: Self-pay | Admitting: Cardiothoracic Surgery

## 2016-12-29 DIAGNOSIS — Z951 Presence of aortocoronary bypass graft: Secondary | ICD-10-CM

## 2016-12-30 ENCOUNTER — Ambulatory Visit (INDEPENDENT_AMBULATORY_CARE_PROVIDER_SITE_OTHER): Payer: Self-pay | Admitting: Cardiothoracic Surgery

## 2016-12-30 ENCOUNTER — Ambulatory Visit
Admission: RE | Admit: 2016-12-30 | Discharge: 2016-12-30 | Disposition: A | Discharge status: Home / Self Care | Payer: Medicare Other | Source: Ambulatory Visit | Attending: Cardiothoracic Surgery | Admitting: Cardiothoracic Surgery

## 2016-12-30 ENCOUNTER — Encounter: Payer: Self-pay | Admitting: Cardiothoracic Surgery

## 2016-12-30 VITALS — BP 134/86 | HR 73 | Resp 20 | Ht 70.0 in | Wt 205.0 lb

## 2016-12-30 DIAGNOSIS — Z951 Presence of aortocoronary bypass graft: Secondary | ICD-10-CM

## 2016-12-31 NOTE — Progress Notes (Signed)
PCP is Leonard Downing, MD Referring Provider is Leonard Downing, *  Chief Complaint  Patient presents with  . Routine Post Op    f/u from surgery with CXR, s/p CABG x5, 11/17/16    HPI: Patient returns for scheduled follow-up 1 month after emergency CABG 5 Patient presented with acute MI, LV dysfunction and severe multivessel CAD. He did well after surgery. He developed postoperative atrial fibrillation treated with amiodarone and was discharged home on Xarelto. He continues to do well and has started outpatient cardiac rehabilitation at [facility] where he is living with his mother. He denies recurrent angina or symptoms of CHF. Surgical incisions are well-healed. His had no problems with edema. He denies any sensation of sternal instability or click.  Chest x-ray taken today shows clear lung Charrier, no pleural effusion, sternal wires intact.  Telemetry shows the patient remained in sinus rhythm. He denies any bleeding complications from the Xarelto  Past Medical History:  Diagnosis Date  . Anxiety   . Chronic lower back pain   . Coronary artery disease 03/2004    3 stents put in   . Depression   . Fibromyalgia   . GERD (gastroesophageal reflux disease)   . History of hiatal hernia   . Hyperlipidemia LDL goal <70   . Hypertension   . Migraine    "weekly; back in the 1990s" (11/12/2016)  . Myocardial infarction ([place]) 2005  . [name] (obstructive sleep apnea)    "quit wearing my mask" (11/12/2016)  . Type II diabetes mellitus ([place]) 2010    Past Surgical History:  Procedure Laterality Date  . CARDIAC CATHETERIZATION  11/12/2016  . CORONARY ANGIOPLASTY WITH STENT PLACEMENT  03/2004    x 3 stents  . CORONARY ARTERY BYPASS GRAFT N/A 11/17/2016   Procedure: CORONARY ARTERY BYPASS GRAFTING (CABG) x 5, USING RIGHT INTERNAL MAMMARY ARTERY AND RIGHT GREATER SAPHENOUS VEIN HARVESTED ENDOSCOPICALLY;  Surgeon: Ivin Poot, MD;  Location: [place];  Service: Open  Heart Surgery;  Laterality: N/A;  . INGUINAL HERNIA REPAIR Right 2000  . LEFT HEART CATH AND CORONARY ANGIOGRAPHY N/A 11/12/2016   Procedure: Left Heart Cath and Coronary Angiography;  Surgeon: Lorretta Harp, MD;  Location: [place] [facility];  Service: Cardiovascular;  Laterality: N/A;  . TEE WITHOUT CARDIOVERSION N/A 11/17/2016   Procedure: TRANSESOPHAGEAL ECHOCARDIOGRAM (TEE);  Surgeon: Prescott Gum, Collier Salina, MD;  Location: [place];  Service: Open Heart Surgery;  Laterality: N/A;    Family History  Problem Relation Age of Onset  . Diabetes Father   . Diabetes Sister   . Diabetes Paternal Grandfather   . Cancer Neg Hx     Social History Social History  Substance Use Topics  . Smoking status: Never Smoker  . Smokeless tobacco: Never Used  . Alcohol use 3.6 oz/week    6 Cans of beer per week    Current Outpatient Prescriptions  Medication Sig Dispense Refill  . ACCU-CHEK FASTCLIX LANCETS MISC 1 each by Does not apply route 3 (three) times daily. 100 each 3  . acetaminophen-codeine (TYLENOL #4) 300-60 MG tablet Take 1 tablet by mouth every 12 (twelve) hours as needed for moderate pain. 30 tablet 0  . amiodarone (PACERONE) 200 MG tablet Take 1 tablet (200 mg total) by mouth every 12 (twelve) hours. For one week;then take Amiodarone 200 mg daily thereafter. 60 tablet 1  . aspirin EC 81 MG tablet Take 81 mg by mouth daily. Reported on 08/06/2015    .  atorvastatin (LIPITOR) 80 MG tablet Take 1 tablet (80 mg total) by mouth daily at 6 PM. 30 tablet 1  . Blood Glucose Monitoring Suppl (TRUE METRIX METER) w/Device KIT 1 each by Does not apply route as needed. 1 kit 0  . clotrimazole (LOTRIMIN) 1 % cream Apply topically 2 (two) times daily. 30 g 0  . gabapentin (NEURONTIN) 300 MG capsule TAKE 2 CAPSULES BY MOUTH 3 TIMES DAILY FOR LOW BACK PAIN AND NEUROPATHY 180 capsule 0  . glucose blood (TRUE METRIX BLOOD GLUCOSE TEST) test strip 1 each by Other route 3 (three) times daily. 100 each 11  .  Insulin Glargine (TOUJEO SOLOSTAR) 300 UNIT/ML SOPN Inject 48 Units into the skin every morning. 9 mL 3  . insulin lispro (HUMALOG KWIKPEN) 100 UNIT/ML KiwkPen Inject 0.08 mLs (8 Units total) into the skin 3 (three) times daily with meals. 15 mL 11  . Insulin Pen Needle (INSUPEN PEN NEEDLES) 32G X 4 MM MISC 1 each by Does not apply route daily. 120 each 5  . metoprolol tartrate (LOPRESSOR) 25 MG tablet Take 1 tablet (25 mg total) by mouth 2 (two) times daily. 60 tablet 1  . Omega-3 Fatty Acids (FISH OIL) 1000 MG CAPS Take 1 capsule (1,000 mg total) by mouth 2 (two) times daily. 60 capsule 11  . oxyCODONE (OXY IR/ROXICODONE) 5 MG immediate release tablet Take 5 mg by mouth every 4-6 hours PRN severe pain. 40 tablet 0  . rivaroxaban (XARELTO) 20 MG TABS tablet Take 1 tablet (20 mg total) by mouth daily with supper. 30 tablet 1  . traZODone (DESYREL) 50 MG tablet Take 2 tablets (100 mg total) by mouth at bedtime.    . TRUEPLUS LANCETS 28G MISC 1 each by Does not apply route 3 (three) times daily. 100 each 11   No current facility-administered medications for this visit.     Allergies  Allergen Reactions  . No Known Allergies     Review of Systems   No fever Improved appetite and strength No ankle edema No sternal click or instability  BP 134/86   Pulse 73   Resp 20   Ht _0  (1.778 m)   Wt 205 lb (93 kg)   SpO2 97% Comment: RA  BMI 29.41 kg/m  Physical Exam      Exam    General- alert and comfortable   Lungs- clear without rales, wheezes   Cor- regular rate and rhythm, no murmur , gallop   Abdomen- soft, non-tender   Extremities - warm, non-tender, minimal edema   Neuro- oriented, appropriate, no focal weakness   Diagnostic Tests: Chest x-ray clear  Impression: Doing well after emergency CABG 5 Patient will stop the amiodarone and then the Xarelto per his cardiologist in [facility]. He understands he can lift no more than 20 pounds until 3 months  after surgery. He understands he should continue aspirin, metoprolol, and statin daily indefinitely.  Plan: The patient will return here as needed.   Len Childs, MD [facility] [phone]

## 2017-01-16 ENCOUNTER — Other Ambulatory Visit: Payer: Self-pay | Admitting: Physician Assistant

## 2017-01-16 DIAGNOSIS — E1165 Type 2 diabetes mellitus with hyperglycemia: Secondary | ICD-10-CM

## 2017-01-16 DIAGNOSIS — E1140 Type 2 diabetes mellitus with diabetic neuropathy, unspecified: Secondary | ICD-10-CM

## 2017-01-16 DIAGNOSIS — IMO0002 Reserved for concepts with insufficient information to code with codable children: Secondary | ICD-10-CM

## 2017-03-02 NOTE — Progress Notes (Signed)
HPI: Follow-up coronary artery disease. Patient admitted in July 2018 with unstable angina. Echocardiogram showed normal LV function and grade 2 diastolic dysfunction. Cardiac catheterization revealed severe three-vessel coronary artery disease. Carotid Dopplers showed 1-39% bilateral stenosis. Patient underwent coronary artery bypassing graft with a LIMA to the LAD, saphenous vein graft to the ramus, saphenous vein graft to the obtuse marginal and sequential saphenous vein graft to the PDA and posterior lateral. Patient did have postoperative atrial fibrillation treated with amiodarone. Since last seen, the patient denies any dyspnea on exertion, orthopnea, PND, pedal edema, palpitations, syncope or chest pain.   Current Outpatient Prescriptions  Medication Sig Dispense Refill  . ACCU-CHEK FASTCLIX LANCETS MISC 1 each by Does not apply route 3 (three) times daily. 100 each 3  . acetaminophen-codeine (TYLENOL #4) 300-60 MG tablet Take 1 tablet by mouth every 12 (twelve) hours as needed for moderate pain. 30 tablet 0  . aspirin EC 81 MG tablet Take 81 mg by mouth daily. Reported on 08/06/2015    . atorvastatin (LIPITOR) 80 MG tablet Take 1 tablet (80 mg total) by mouth daily at 6 PM. 30 tablet 1  . Blood Glucose Monitoring Suppl (TRUE METRIX METER) w/Device KIT 1 each by Does not apply route as needed. 1 kit 0  . clotrimazole (LOTRIMIN) 1 % cream Apply topically 2 (two) times daily. 30 g 0  . gabapentin (NEURONTIN) 300 MG capsule TAKE 2 CAPSULES BY MOUTH 3 TIMES DAILY FOR LOW BACK PAIN AND NEUROPATHY 180 capsule 0  . glucose blood (TRUE METRIX BLOOD GLUCOSE TEST) test strip 1 each by Other route 3 (three) times daily. 100 each 11  . Insulin Glargine (TOUJEO SOLOSTAR) 300 UNIT/ML SOPN Inject 48 Units into the skin every morning. 9 mL 3  . insulin lispro (HUMALOG KWIKPEN) 100 UNIT/ML KiwkPen Inject 0.08 mLs (8 Units total) into the skin 3 (three) times daily with meals. 15 mL 11  . Insulin Pen  Needle (INSUPEN PEN NEEDLES) 32G X 4 MM MISC 1 each by Does not apply route daily. 120 each 5  . metoprolol tartrate (LOPRESSOR) 25 MG tablet Take 1 tablet (25 mg total) by mouth 2 (two) times daily. 60 tablet 1  . Omega-3 Fatty Acids (FISH OIL) 1000 MG CAPS Take 1 capsule (1,000 mg total) by mouth 2 (two) times daily. 60 capsule 11  . oxyCODONE (OXY IR/ROXICODONE) 5 MG immediate release tablet Take 5 mg by mouth every 4-6 hours PRN severe pain. 40 tablet 0  . rivaroxaban (XARELTO) 20 MG TABS tablet Take 1 tablet (20 mg total) by mouth daily with supper. 30 tablet 1  . traZODone (DESYREL) 50 MG tablet Take 2 tablets (100 mg total) by mouth at bedtime.    . TRUEPLUS LANCETS 28G MISC 1 each by Does not apply route 3 (three) times daily. 100 each 11   No current facility-administered medications for this visit.      Past Medical History:  Diagnosis Date  . Anxiety   . Chronic lower back pain   . Coronary artery disease 03/2004    3 stents put in   . Depression   . Fibromyalgia   . GERD (gastroesophageal reflux disease)   . History of hiatal hernia   . Hyperlipidemia LDL goal <70   . Hypertension   . Migraine    "weekly; back in the 1990s" (11/12/2016)  . Myocardial infarction ([place]) 2005  . [name] (obstructive sleep apnea)    "quit wearing my mask" (  11/12/2016)  . Type II diabetes mellitus ([place]) 2010    Past Surgical History:  Procedure Laterality Date  . CARDIAC CATHETERIZATION  11/12/2016  . CORONARY ANGIOPLASTY WITH STENT PLACEMENT  03/2004    x 3 stents  . CORONARY ARTERY BYPASS GRAFT N/A 11/17/2016   Procedure: CORONARY ARTERY BYPASS GRAFTING (CABG) x 5, USING RIGHT INTERNAL MAMMARY ARTERY AND RIGHT GREATER SAPHENOUS VEIN HARVESTED ENDOSCOPICALLY;  Surgeon: Ivin Poot, MD;  Location: [place];  Service: Open Heart Surgery;  Laterality: N/A;  . INGUINAL HERNIA REPAIR Right 2000  . LEFT HEART CATH AND CORONARY ANGIOGRAPHY N/A 11/12/2016   Procedure: Left Heart Cath and Coronary  Angiography;  Surgeon: Lorretta Harp, MD;  Location: [facility];  Service: Cardiovascular;  Laterality: N/A;  . TEE WITHOUT CARDIOVERSION N/A 11/17/2016   Procedure: TRANSESOPHAGEAL ECHOCARDIOGRAM (TEE);  Surgeon: Prescott Gum, Collier Salina, MD;  Location: [place];  Service: Open Heart Surgery;  Laterality: N/A;    Social History   Social History  . Marital status: Divorced    Spouse name: N/A  . Number of children: 1   . Years of education: 30    Occupational History  . Unemployed     Social History Main Topics  . Smoking status: Never Smoker  . Smokeless tobacco: Never Used  . Alcohol use 3.6 oz/week    6 Cans of beer per week  . Drug use: No  . Sexual activity: No   Other Topics Concern  . Not on file   Social History Narrative   Lives alone.    Daughter lives nearby,5 miles away   Sister next door.    1 grandchild, 45 yo granddaughter     Family History  Problem Relation Age of Onset  . Diabetes Father   . Diabetes Sister   . Diabetes Paternal Grandfather   . Cancer Neg Hx     ROS: no fevers or chills, productive cough, hemoptysis, dysphasia, odynophagia, melena, hematochezia, dysuria, hematuria, rash, seizure activity, orthopnea, PND, pedal edema, claudication. Remaining systems are negative.  Physical Exam: Well-developed well-nourished in no acute distress.  Skin is warm and dry.  HEENT is normal.  Neck is supple.  Chest is clear to auscultation with normal expansion; s/p sternotomy Cardiovascular exam is regular rate and rhythm.  Abdominal exam nontender or distended. No masses palpated. Extremities show no edema. neuro grossly intact  ECG- Normal sinus rhythm at a rate of 76. Prior inferior infarct. personally reviewed  A/P  1 Coronary artery disease status post coronary artery bypass graft-patient has done well since his surgery. He is not having chest pain. Plan to continue medical therapy with aspirin and statin.  2 hypertension-blood pressure is  controlled. Continue present medications.  3 hyperlipidemia-continue statin. Check lipids and liver.  4 postoperative atrial fibrillation-patient is maintaining sinus rhythm. His amiodarone has been discontinued. I will also discontinue his xarelto.  5 diabetes mellitus-managed by primary care.  Kirk Ruths, MD

## 2017-03-11 ENCOUNTER — Ambulatory Visit (INDEPENDENT_AMBULATORY_CARE_PROVIDER_SITE_OTHER): Payer: Medicare Other | Admitting: Cardiology

## 2017-03-11 ENCOUNTER — Encounter: Payer: Self-pay | Admitting: Cardiology

## 2017-03-11 VITALS — BP 110/74 | HR 76 | Ht 70.0 in | Wt 207.0 lb

## 2017-03-11 DIAGNOSIS — I480 Paroxysmal atrial fibrillation: Secondary | ICD-10-CM | POA: Diagnosis not present

## 2017-03-11 DIAGNOSIS — I10 Essential (primary) hypertension: Secondary | ICD-10-CM

## 2017-03-11 DIAGNOSIS — E7800 Pure hypercholesterolemia, unspecified: Secondary | ICD-10-CM

## 2017-03-11 DIAGNOSIS — I2510 Atherosclerotic heart disease of native coronary artery without angina pectoris: Secondary | ICD-10-CM | POA: Diagnosis not present

## 2017-03-11 NOTE — Patient Instructions (Addendum)
Medication Instructions:   STOP XARELTO  Labwork:  Your physician recommends that you RETURN FOR LAB WORK PRIOR TO EATING  Follow-Up:  Your physician wants you to follow-up in: 6 MONTHS WITH DR Jens SomRENSHAW You will receive a reminder letter in the mail two months in advance. If you don't receive a letter, please call our office to schedule the follow-up appointment.   If you need a refill on your cardiac medications before your next appointment, please call your pharmacy.

## 2017-04-02 ENCOUNTER — Other Ambulatory Visit: Payer: Self-pay | Admitting: Physician Assistant

## 2017-04-02 DIAGNOSIS — E1165 Type 2 diabetes mellitus with hyperglycemia: Secondary | ICD-10-CM

## 2017-04-02 DIAGNOSIS — IMO0002 Reserved for concepts with insufficient information to code with codable children: Secondary | ICD-10-CM

## 2017-04-02 DIAGNOSIS — E1140 Type 2 diabetes mellitus with diabetic neuropathy, unspecified: Secondary | ICD-10-CM

## 2017-04-08 ENCOUNTER — Encounter: Payer: Self-pay | Admitting: *Deleted

## 2019-08-31 NOTE — Progress Notes (Signed)
HPI: Follow-up coronary artery disease. Patient admitted in July 2018 with unstable angina. Echocardiogram showed normal LV function and grade 2 diastolic dysfunction. Cardiac catheterization revealed severe three-vessel coronary artery disease. Carotid Dopplers showed 1-39% bilateral stenosis. Patient underwent coronary artery bypassing graft with a LIMA to the LAD, saphenous vein graft to the ramus, saphenous vein graft to the obtuse marginal and sequential saphenous vein graft to the PDA and posterior lateral. Patient did have postoperative atrial fibrillation treated with amiodarone. Since last seen 11/18, patient denies dyspnea, exertional chest pain or syncope.  Current Outpatient Medications  Medication Sig Dispense Refill  . ACCU-CHEK FASTCLIX LANCETS MISC 1 each by Does not apply route 3 (three) times daily. 100 each 3  . aspirin EC 81 MG tablet Take 81 mg by mouth daily. Reported on 08/06/2015    . Blood Glucose Monitoring Suppl (TRUE METRIX METER) w/Device KIT 1 each by Does not apply route as needed. 1 kit 0  . glucose blood (TRUE METRIX BLOOD GLUCOSE TEST) test strip 1 each by Other route 3 (three) times daily. 100 each 11  . Insulin Pen Needle (INSUPEN PEN NEEDLES) 32G X 4 MM MISC 1 each by Does not apply route daily. 120 each 5  . lisinopril (ZESTRIL) 5 MG tablet Take 5 mg by mouth daily.    . metoprolol tartrate (LOPRESSOR) 25 MG tablet Take 1 tablet (25 mg total) by mouth 2 (two) times daily. 60 tablet 1  . simvastatin (ZOCOR) 20 MG tablet Take 20 mg by mouth at bedtime.    . TRUEPLUS LANCETS 28G MISC 1 each by Does not apply route 3 (three) times daily. 100 each 11   No current facility-administered medications for this visit.     Past Medical History:  Diagnosis Date  . Anxiety   . Chronic lower back pain   . Coronary artery disease 03/2004    3 stents put in   . Depression   . Fibromyalgia   . GERD (gastroesophageal reflux disease)   . History of hiatal hernia    . Hyperlipidemia LDL goal <70   . Hypertension   . Migraine    "weekly; back in the 1990s" (11/12/2016)  . Myocardial infarction ([place]) 2005  . [name] (obstructive sleep apnea)    "quit wearing my mask" (11/12/2016)  . Type II diabetes mellitus ([place]) 2010    Past Surgical History:  Procedure Laterality Date  . CARDIAC CATHETERIZATION  11/12/2016  . CORONARY ANGIOPLASTY WITH STENT PLACEMENT  03/2004    x 3 stents  . CORONARY ARTERY BYPASS GRAFT N/A 11/17/2016   Procedure: CORONARY ARTERY BYPASS GRAFTING (CABG) x 5, USING RIGHT INTERNAL MAMMARY ARTERY AND RIGHT GREATER SAPHENOUS VEIN HARVESTED ENDOSCOPICALLY;  Surgeon: Ivin Poot, MD;  Location: [place];  Service: Open Heart Surgery;  Laterality: N/A;  . INGUINAL HERNIA REPAIR Right 2000  . LEFT HEART CATH AND CORONARY ANGIOGRAPHY N/A 11/12/2016   Procedure: Left Heart Cath and Coronary Angiography;  Surgeon: Lorretta Harp, MD;  Location: [facility];  Service: Cardiovascular;  Laterality: N/A;  . TEE WITHOUT CARDIOVERSION N/A 11/17/2016   Procedure: TRANSESOPHAGEAL ECHOCARDIOGRAM (TEE);  Surgeon: Prescott Gum, Collier Salina, MD;  Location: [place];  Service: Open Heart Surgery;  Laterality: N/A;    Social History   Socioeconomic History  . Marital status: Divorced    Spouse name: Not on file  . Number of children: 1   . Years of education: 21   . Highest  education level: Not on file  Occupational History  . Occupation: Unemployed   Tobacco Use  . Smoking status: Never Smoker  . Smokeless tobacco: Never Used  Substance and Sexual Activity  . Alcohol use: Yes    Alcohol/week: 6.0 standard drinks    Types: 6 Cans of beer per week  . Drug use: No  . Sexual activity: Never  Other Topics Concern  . Not on file  Social History Narrative   Lives alone.    Daughter lives nearby,5 miles away   Sister next door.    1 grandchild, 48 yo granddaughter    Social Determinants of Radio broadcast assistant Strain:   . Difficulty of Paying  Living Expenses:   Food Insecurity:   . Worried About Charity fundraiser in the Last Year:   . Arboriculturist in the Last Year:   Transportation Needs:   . Film/video editor (Medical):   Marland Kitchen Lack of Transportation (Non-Medical):   Physical Activity:   . Days of Exercise per Week:   . Minutes of Exercise per Session:   Stress:   . Feeling of Stress :   Social Connections:   . Frequency of Communication with Friends and Family:   . Frequency of Social Gatherings with Friends and Family:   . Attends Religious Services:   . Active Member of Clubs or Organizations:   . Attends Archivist Meetings:   Marland Kitchen Marital Status:   Intimate Partner Violence:   . Fear of Current or Ex-Partner:   . Emotionally Abused:   Marland Kitchen Physically Abused:   . Sexually Abused:     Family History  Problem Relation Age of Onset  . Diabetes Father   . Diabetes Sister   . Diabetes Paternal Grandfather   . Cancer Neg Hx     ROS: no fevers or chills, productive cough, hemoptysis, dysphasia, odynophagia, melena, hematochezia, dysuria, hematuria, rash, seizure activity, orthopnea, PND, pedal edema, claudication. Remaining systems are negative.  Physical Exam: Well-developed well-nourished in no acute distress.  Skin is warm and dry.  HEENT is normal.  Neck is supple.  Chest is clear to auscultation with normal expansion.  Cardiovascular exam is regular rate and rhythm.  Abdominal exam nontender or distended. No masses palpated. Extremities show no edema. neuro grossly intact  ECG-sinus rhythm at a rate of 69, right axis deviation, RV conduction delay, inferior infarct, cannot rule out anterior infarct.  Personally reviewed  A/P  1 coronary artery disease status post coronary artery bypass and graft-patient continues to do well following previous bypass surgery.  He denies chest pain.  Plan to continue aspirin and statin.  2 hypertension-blood pressure controlled.  Continue present medical  regimen and follow.  Will obtain most recent potassium and renal function from primary care.  3 hyperlipidemia-discontinue Zocor.  Instead we will treat with Crestor 40 mg daily.  Check lipids and liver in 12 weeks.  4 diabetes mellitus-patient has not taken his insulin.  He states his blood sugar is running 300-400.  I discussed the importance of glucose control.  He is frustrated with taking medications by his report.  I will have endocrinology evaluate and adjust regimen.  Kirk Ruths, MD

## 2019-09-06 ENCOUNTER — Ambulatory Visit: Payer: Medicare Other | Admitting: Cardiology

## 2019-09-06 ENCOUNTER — Encounter: Payer: Self-pay | Admitting: Cardiology

## 2019-09-06 ENCOUNTER — Other Ambulatory Visit: Payer: Self-pay

## 2019-09-06 VITALS — BP 126/76 | HR 69 | Ht 71.0 in | Wt 190.4 lb

## 2019-09-06 DIAGNOSIS — E785 Hyperlipidemia, unspecified: Secondary | ICD-10-CM

## 2019-09-06 DIAGNOSIS — I10 Essential (primary) hypertension: Secondary | ICD-10-CM

## 2019-09-06 DIAGNOSIS — E1159 Type 2 diabetes mellitus with other circulatory complications: Secondary | ICD-10-CM

## 2019-09-06 DIAGNOSIS — IMO0002 Reserved for concepts with insufficient information to code with codable children: Secondary | ICD-10-CM

## 2019-09-06 DIAGNOSIS — I2510 Atherosclerotic heart disease of native coronary artery without angina pectoris: Secondary | ICD-10-CM | POA: Diagnosis not present

## 2019-09-06 DIAGNOSIS — E1169 Type 2 diabetes mellitus with other specified complication: Secondary | ICD-10-CM | POA: Diagnosis not present

## 2019-09-06 DIAGNOSIS — I152 Hypertension secondary to endocrine disorders: Secondary | ICD-10-CM

## 2019-09-06 DIAGNOSIS — E1140 Type 2 diabetes mellitus with diabetic neuropathy, unspecified: Secondary | ICD-10-CM | POA: Diagnosis not present

## 2019-09-06 DIAGNOSIS — E1165 Type 2 diabetes mellitus with hyperglycemia: Secondary | ICD-10-CM

## 2019-09-06 MED ORDER — ROSUVASTATIN CALCIUM 40 MG PO TABS: 40.0000 mg | ORAL_TABLET | Freq: Every day | ORAL | 3 refills | Status: DC

## 2019-09-06 NOTE — Patient Instructions (Signed)
Medication Instructions:  STOP SIMVASTATIN  START ROSUVASTATIN 40 MG ONCE DAILY  *If you need a refill on your cardiac medications before your next appointment, please call your pharmacy*   Lab Work: Your physician recommends that you return for lab work in: 12 WEEKS PRIOR TO EATING  If you have labs (blood work) drawn today and your tests are completely normal, you will receive your results only by: Marland Kitchen MyChart Message (if you have MyChart) OR . A paper copy in the mail If you have any lab test that is abnormal or we need to change your treatment, we will call you to review the results.   Follow-Up: At [facility], you and your health needs are our priority.  As part of our continuing mission to provide you with exceptional heart care, we have created designated Provider Care Teams.  These Care Teams include your primary Cardiologist (physician) and Advanced Practice Providers (APPs -  Physician Assistants and Nurse Practitioners) who all work together to provide you with the care you need, when you need it.  We recommend signing up for the patient portal called "MyChart".  Sign up information is provided on this After Visit Summary.  MyChart is used to connect with patients for Virtual Visits (Telemedicine).  Patients are able to view lab/test results, encounter notes, upcoming appointments, etc.  Non-urgent messages can be sent to your provider as well.   To learn more about what you can do with MyChart, go to [email or web address].    Your next appointment:   12 month(s)  The format for your next appointment:   Either In Person or Virtual  Provider:   Olga Millers, MD   Other Instructions REFERRAL TO DR JEFFREY Sharl Ma AT [facility]

## 2019-09-19 ENCOUNTER — Other Ambulatory Visit: Payer: Self-pay | Admitting: *Deleted

## 2019-09-19 ENCOUNTER — Telehealth: Payer: Self-pay | Admitting: Cardiology

## 2019-09-19 DIAGNOSIS — E1140 Type 2 diabetes mellitus with diabetic neuropathy, unspecified: Secondary | ICD-10-CM

## 2019-09-19 DIAGNOSIS — IMO0002 Reserved for concepts with insufficient information to code with codable children: Secondary | ICD-10-CM

## 2019-09-19 DIAGNOSIS — E1165 Type 2 diabetes mellitus with hyperglycemia: Secondary | ICD-10-CM

## 2019-09-19 NOTE — Telephone Encounter (Signed)
Left message informing patient of the referral made to [facility]-----office should contact patient to schedule an appointment----asked patient to call our office if her had heard from that office to give [place] a call.

## 2019-09-19 NOTE — Progress Notes (Signed)
amb ref °

## 2019-10-31 ENCOUNTER — Other Ambulatory Visit: Payer: Self-pay

## 2019-10-31 ENCOUNTER — Encounter: Payer: Self-pay | Admitting: Internal Medicine

## 2019-10-31 ENCOUNTER — Ambulatory Visit (INDEPENDENT_AMBULATORY_CARE_PROVIDER_SITE_OTHER): Payer: Medicare Other | Admitting: Internal Medicine

## 2019-10-31 VITALS — BP 130/80 | HR 72 | Ht 71.0 in | Wt 188.6 lb

## 2019-10-31 DIAGNOSIS — E1142 Type 2 diabetes mellitus with diabetic polyneuropathy: Secondary | ICD-10-CM | POA: Diagnosis not present

## 2019-10-31 DIAGNOSIS — E1159 Type 2 diabetes mellitus with other circulatory complications: Secondary | ICD-10-CM | POA: Diagnosis not present

## 2019-10-31 DIAGNOSIS — E1140 Type 2 diabetes mellitus with diabetic neuropathy, unspecified: Secondary | ICD-10-CM

## 2019-10-31 DIAGNOSIS — E1165 Type 2 diabetes mellitus with hyperglycemia: Secondary | ICD-10-CM

## 2019-10-31 DIAGNOSIS — E785 Hyperlipidemia, unspecified: Secondary | ICD-10-CM

## 2019-10-31 DIAGNOSIS — IMO0002 Reserved for concepts with insufficient information to code with codable children: Secondary | ICD-10-CM

## 2019-10-31 LAB — POCT GLYCOSYLATED HEMOGLOBIN (HGB A1C): Hemoglobin A1C: 13.9 % — AB (ref 4.0–5.6)

## 2019-10-31 MED ORDER — NOVOLOG FLEXPEN 100 UNIT/ML ~~LOC~~ SOPN: 7.0000 [IU] | PEN_INJECTOR | Freq: Three times a day (TID) | SUBCUTANEOUS | 11 refills | Status: DC

## 2019-10-31 MED ORDER — INSULIN PEN NEEDLE 32G X 4 MM MISC: 1.0000 | Freq: Four times a day (QID) | 11 refills | Status: DC

## 2019-10-31 MED ORDER — LANTUS SOLOSTAR 100 UNIT/ML ~~LOC~~ SOPN: 20.0000 [IU] | PEN_INJECTOR | Freq: Every day | SUBCUTANEOUS | 11 refills | Status: DC

## 2019-10-31 NOTE — Progress Notes (Signed)
Name: [name] [name]  MRN/ DOB: [id number], 12-07-1960   Age/ Sex: 59 y.o., male    PCP: Leonard Downing, MD   Reason for Endocrinology Evaluation: Type 2 Diabetes Mellitus     Date of Initial Endocrinology Visit: 10/31/2019     PATIENT IDENTIFIER: [name] is a 59 y.o. male with a past medical history of T2Dm, [name], HTN, Dyslipidemia and CAD (S/P stents 2005 and CABG 2018). The patient presented for initial endocrinology clinic visit on 10/31/2019 for consultative assistance with his diabetes management.    HPI: Mr. Crumpacker was    Diagnosed with DM in 2015 Prior Medications tried/Intolerance: Metformin - massive diarrhea. Touejo - too much trouble to get  Currently checking blood sugars 1 x / day, Hypoglycemia episodes : no               Hemoglobin A1c has ranged from 8.9% in 2016, peaking at 13.7% in 2015. Patient required assistance for hypoglycemia: no Patient has required hospitalization within the last 1 year from hyper or hypoglycemia: no  In terms of diet, the patient eats 2 meals a day, snacks 1-2x a day. Avoids sugar sweetened beverages    On disability for heart and back issues     HOME DIABETES REGIMEN: N/A    Statin:Yes ACE-I/ARB: Was prescribed but no taking  Prior Diabetic Education: No    METER DOWNLOAD SUMMARY: Unable to download  BG > 250 mg/dL    DIABETIC COMPLICATIONS: Microvascular complications:   Mild retinopathy , neuropathy   Denies: CKD  Last eye exam: Completed 08/2019  Macrovascular complications:   CAD (S/p CABG 2018)  Denies: PVD, CVA   PAST HISTORY: Past Medical History:  Past Medical History:  Diagnosis Date  . Anxiety   . Chronic lower back pain   . Coronary artery disease 03/2004    3 stents put in   . Depression   . Fibromyalgia   . GERD (gastroesophageal reflux disease)   . History of hiatal hernia   . Hyperlipidemia LDL goal <70   . Hypertension   . Migraine    "weekly; back in the 1990s"  (11/12/2016)  . Myocardial infarction ([place]) 2005  . [name] (obstructive sleep apnea)    "quit wearing my mask" (11/12/2016)  . Type II diabetes mellitus ([place]) 2010   Past Surgical History:  Past Surgical History:  Procedure Laterality Date  . CARDIAC CATHETERIZATION  11/12/2016  . CORONARY ANGIOPLASTY WITH STENT PLACEMENT  03/2004    x 3 stents  . CORONARY ARTERY BYPASS GRAFT N/A 11/17/2016   Procedure: CORONARY ARTERY BYPASS GRAFTING (CABG) x 5, USING RIGHT INTERNAL MAMMARY ARTERY AND RIGHT GREATER SAPHENOUS VEIN HARVESTED ENDOSCOPICALLY;  Surgeon: Ivin Poot, MD;  Location: [facility];  Service: Open Heart Surgery;  Laterality: N/A;  . INGUINAL HERNIA REPAIR Right 2000  . LEFT HEART CATH AND CORONARY ANGIOGRAPHY N/A 11/12/2016   Procedure: Left Heart Cath and Coronary Angiography;  Surgeon: Lorretta Harp, MD;  Location: [facility];  Service: Cardiovascular;  Laterality: N/A;  . TEE WITHOUT CARDIOVERSION N/A 11/17/2016   Procedure: TRANSESOPHAGEAL ECHOCARDIOGRAM (TEE);  Surgeon: Prescott Gum, Collier Salina, MD;  Location: [facility];  Service: Open Heart Surgery;  Laterality: N/A;      Social History:  reports that he has never smoked. He has never used smokeless tobacco. He reports current alcohol use of about 6.0 standard drinks of alcohol per week. He reports that he does not use drugs. Family History:  Family History  Problem Relation Age of Onset  . Diabetes Father   . Diabetes Sister   . Diabetes Paternal Grandfather   . Cancer Neg Hx      HOME MEDICATIONS: Allergies as of 10/31/2019      Reactions   No Known Allergies       Medication List       Accurate as of October 31, 2019  2:02 PM. If you have any questions, ask your nurse or doctor.        STOP taking these medications   Insulin Pen Needle 32G X 4 MM Misc Commonly known as: Insupen Pen Needles Stopped by: Dorita Sciara, MD   lisinopril 5 MG tablet Commonly known as: ZESTRIL Stopped by: Dorita Sciara,  MD     TAKE these medications   aspirin EC 81 MG tablet Take 81 mg by mouth daily. Reported on 08/06/2015   glucose blood test strip 1 each by Other route in the morning, at noon, in the evening, and at bedtime. Use Onetouch ultra test strips as instructed to check blood sugar 3-4 times daily. What changed: Another medication with the same name was removed. Continue taking this medication, and follow the directions you see here. Changed by: Dorita Sciara, MD   metoprolol tartrate 25 MG tablet Commonly known as: LOPRESSOR Take 1 tablet (25 mg total) by mouth 2 (two) times daily.   ONE TOUCH ULTRA 2 w/Device Kit 1 each by Does not apply route in the morning, at noon, in the evening, and at bedtime. Use OneTouch Ultra meter to check blood sugar 3-4 times daily. What changed: Another medication with the same name was removed. Continue taking this medication, and follow the directions you see here. Changed by: Dorita Sciara, MD   OneTouch Delica Lancets 82U Misc 1 each by Does not apply route in the morning, at noon, in the evening, and at bedtime. Use Onetouch Delica lancets to check blood sugar 3-4 times daily. What changed: Another medication with the same name was removed. Continue taking this medication, and follow the directions you see here. Changed by: Dorita Sciara, MD   rosuvastatin 40 MG tablet Commonly known as: CRESTOR Take 1 tablet (40 mg total) by mouth daily.        ALLERGIES: Allergies  Allergen Reactions  . No Known Allergies      REVIEW OF SYSTEMS: A comprehensive ROS was conducted with the patient and is negative except as per HPI    OBJECTIVE:   VITAL SIGNS: BP 130/80 (BP Location: Left Arm, Patient Position: Sitting, Cuff Size: Normal)   Pulse 72   Ht 5' 11" (1.803 m)   Wt 188 lb 9.6 oz (85.5 kg)   SpO2 97%   BMI 26.30 kg/m    PHYSICAL EXAM:  General: Pt appears well and is in NAD  HEENT:  Eyes: External eye exam normal  without stare, lid lag or exophthalmos.  EOM intact.    Neck: General: Supple without adenopathy or carotid bruits. Thyroid: Thyroid size normal.  No goiter or nodules appreciated. No thyroid bruit.  Lungs: Clear with good BS bilat with no rales, rhonchi, or wheezes  Heart: RRR with normal S1 and S2 and no gallops; no murmurs; no rub  Abdomen: Normoactive bowel sounds, soft, nontender, without masses or organomegaly palpable  Extremities:  Lower extremities - No pretibial edema. No lesions.  Skin: Normal texture and temperature to palpation. No rash noted.   Neuro: MS  is good with appropriate affect, pt is alert and Ox3    DM foot exam: deferred   DATA REVIEWED:  Lab Results  Component Value Date   HGBA1C 13.9 (A) 10/31/2019   HGBA1C 12.0 (H) 11/13/2016   HGBA1C 10.7 11/15/2015    Lab Results  Component Value Date   CHOL 111 11/13/2016   HDL 26 (L) 11/13/2016   LDLCALC 36 11/13/2016   TRIG 244 (H) 11/13/2016   CHOLHDL 4.3 11/13/2016       Results for JERY, HOLLERN (MRN [id number]) as of 11/01/2019 15:55  Ref. Range 10/31/2019 14:44  Sodium Latest Ref Range: 135 - 145 mEq/L 135  Potassium Latest Ref Range: 3.5 - 5.1 mEq/L 4.6  Chloride Latest Ref Range: 96 - 112 mEq/L 103  CO2 Latest Ref Range: 19 - 32 mEq/L 24  Glucose Latest Ref Range: 70 - 99 mg/dL 406 (H)  BUN Latest Ref Range: 6 - 23 mg/dL 17  Creatinine Latest Ref Range: 0.40 - 1.50 mg/dL 0.85  Calcium Latest Ref Range: 8.4 - 10.5 mg/dL 9.9  GFR Latest Ref Range: >60.00 mL/min 92.06  MICROALB/CREAT RATIO Latest Ref Range: 0.0 - 30.0 mg/g 6.0     ASSESSMENT / PLAN / RECOMMENDATIONS:    1) Type 2 Diabetes Mellitus, Poorly controlled, With Neuropathic and Macrovascular complications - Most recent A1c of 13.9% %. Goal A1c < 7.0 %.      Plan: GENERAL: I have discussed with the patient the pathophysiology of diabetes. We went over the natural progression of the disease. We talked about both insulin resistance and  insulin deficiency. We stressed the importance of lifestyle changes including diet and exercise. I explained the complications associated with diabetes including retinopathy, nephropathy, neuropathy as well as increased risk of cardiovascular disease. We went over the benefit seen with glycemic control.    I explained to the patient that diabetic patients are at higher than normal risk for amputations.   Pt interested in checking for T1DM, I will proceed with Ab testing.   I have suggested multiple daily injections of insulin given high A1c, and he is in agreement at this time.   Pt will be referred to our RD for further education Discussed pharmacokinetics of basal/bolus insulin and the importance of taking prandial insulin with meals.   We also discussed avoiding sugar-sweetened beverages and snacks, when possible.   Pt will be a great candidate for SGLT-2 inhibitors , this will be considered in the future.    MEDICATIONS: - Lantus 20 units daily  - Novolog 7 units with each meal    EDUCATION / INSTRUCTIONS:  BG monitoring instructions: Patient is instructed to check his blood sugars 3 times a day, before meals.  Call [facility] if: BG persistently < 70 or > 300. . I reviewed the Rule of 15 for the treatment of hypoglycemia in detail with the patient. Literature supplied.   2) Diabetic complications:   Eye: Does not have known diabetic retinopathy.   Neuro/ Feet: Does  have known diabetic peripheral neuropathy.  Renal: Patient does not have known baseline CKD. He is not on an ACEI/ARB at present.   3) Dyslipidemia: Pt on rosuvastatin. LDL at goal. Discussed cardiovascular benefits of statins .      F/U in 3 months     Signed electronically by: Mack Guise, MD  [facility]  [facility] [address], [place] [address] Phone: [phone] FAX: [phone]   CC:  Leonard Downing,  MD [place] [address] Phone: [phone]  Fax: [phone]    Return to Endocrinology clinic as below: No future appointments.

## 2019-10-31 NOTE — Patient Instructions (Addendum)
-   Lantus 20 units daily  - Novolog 7 units with each meal     - Check sugar before each meal     HOW TO TREAT LOW BLOOD SUGARS (Blood sugar LESS THAN 70 MG/DL)  Please follow the RULE OF 15 for the treatment of hypoglycemia treatment (when your (blood sugars are less than 70 mg/dL)    STEP 1: Take 15 grams of carbohydrates when your blood sugar is low, which includes:   3-4 GLUCOSE TABS  OR  3-4 OZ OF JUICE OR REGULAR SODA OR  ONE TUBE OF GLUCOSE GEL     STEP 2: RECHECK blood sugar in 15 MINUTES STEP 3: If your blood sugar is still low at the 15 minute recheck --> then, go back to STEP 1 and treat AGAIN with another 15 grams of carbohydrates.

## 2019-11-01 ENCOUNTER — Encounter: Payer: Self-pay | Admitting: Internal Medicine

## 2019-11-01 ENCOUNTER — Other Ambulatory Visit: Payer: Self-pay

## 2019-11-01 ENCOUNTER — Telehealth: Payer: Self-pay | Admitting: Family Medicine

## 2019-11-01 DIAGNOSIS — E119 Type 2 diabetes mellitus without complications: Secondary | ICD-10-CM | POA: Insufficient documentation

## 2019-11-01 DIAGNOSIS — E785 Hyperlipidemia, unspecified: Secondary | ICD-10-CM | POA: Insufficient documentation

## 2019-11-01 DIAGNOSIS — E1142 Type 2 diabetes mellitus with diabetic polyneuropathy: Secondary | ICD-10-CM | POA: Insufficient documentation

## 2019-11-01 LAB — BASIC METABOLIC PANEL
BUN: 17 mg/dL (ref 6–23)
CO2: 24 mEq/L (ref 19–32)
Calcium: 9.9 mg/dL (ref 8.4–10.5)
Chloride: 103 mEq/L (ref 96–112)
Creatinine, Ser: 0.85 mg/dL (ref 0.40–1.50)
GFR: 92.06 mL/min (ref >60.00)
Glucose, Bld: 406 mg/dL — ABNORMAL HIGH (ref 70–99)
Potassium: 4.6 mEq/L (ref 3.5–5.1)
Sodium: 135 mEq/L (ref 135–145)

## 2019-11-01 LAB — MICROALBUMIN / CREATININE URINE RATIO
Creatinine,U: 44.5 mg/dL
Microalb Creat Ratio: 6 mg/g (ref 0.0–30.0)
Microalb, Ur: 2.7 mg/dL — ABNORMAL HIGH (ref 0.0–1.9)

## 2019-11-01 MED ORDER — NOVOLOG FLEXPEN 100 UNIT/ML ~~LOC~~ SOPN: 7.0000 [IU] | PEN_INJECTOR | Freq: Three times a day (TID) | SUBCUTANEOUS | 11 refills | Status: DC

## 2019-11-01 MED ORDER — GLUCOSE BLOOD VI STRP: 1.0000 | ORAL_STRIP | Freq: Three times a day (TID) | 3 refills | Status: DC

## 2019-11-01 MED ORDER — INSULIN PEN NEEDLE 32G X 4 MM MISC: 1.0000 | Freq: Four times a day (QID) | 11 refills | Status: DC

## 2019-11-01 MED ORDER — INSULIN LISPRO (1 UNIT DIAL) 100 UNIT/ML (KWIKPEN): 7.0000 [IU] | PEN_INJECTOR | Freq: Three times a day (TID) | SUBCUTANEOUS | 11 refills | Status: DC

## 2019-11-01 MED ORDER — LANTUS SOLOSTAR 100 UNIT/ML ~~LOC~~ SOPN: 20.0000 [IU] | PEN_INJECTOR | Freq: Every day | SUBCUTANEOUS | 11 refills | Status: DC

## 2019-11-01 NOTE — Telephone Encounter (Signed)
Spoke to pt and he stated that Lantus is $35 for but novolog was too high, he stated that it was discussed during visit yesterday. Pt also needed test strips which I have sent.

## 2019-11-01 NOTE — Telephone Encounter (Signed)
Caller : Nazire  Call Back # [phone]   insulin aspart (NOVOLOG FLEXPEN) 100 UNIT/ML FlexPen [[id number]]    Patient states insulin is $500.00 on his insurance, please change medication to something he can afford.    [id number] - [place], [place] - [address]  [address] [place] [address]  Phone:  [phone] Fax:  [phone]  DEA #:  [id number]

## 2019-11-01 NOTE — Telephone Encounter (Signed)
Pt informed

## 2019-11-03 ENCOUNTER — Telehealth: Payer: Self-pay | Admitting: Internal Medicine

## 2019-11-03 NOTE — Telephone Encounter (Signed)
When you get a chance can you please check on the humalog and see if he was able to pick it up?   Thanks    Abby Raelyn Mora, MD  [facility]  [facility] [address] [place], [place] [address] Phone: [phone] FAX: [phone]

## 2019-11-05 LAB — ISLET CELL AB SCREEN RFLX TO TITER: ISLET CELL ANTIBODY SCREEN: NEGATIVE

## 2019-11-05 LAB — GLUTAMIC ACID DECARBOXYLASE AUTO ABS: Glutamic Acid Decarb Ab: <5 IU/mL (ref <5)

## 2019-11-06 NOTE — Telephone Encounter (Signed)
Pt stated that he would pick it up today.

## 2019-12-28 ENCOUNTER — Encounter: Payer: Self-pay | Admitting: Dietician

## 2019-12-28 ENCOUNTER — Encounter: Payer: Medicare Other | Attending: Internal Medicine | Admitting: Dietician

## 2019-12-28 ENCOUNTER — Other Ambulatory Visit: Payer: Self-pay

## 2019-12-28 DIAGNOSIS — E1165 Type 2 diabetes mellitus with hyperglycemia: Secondary | ICD-10-CM | POA: Diagnosis present

## 2019-12-28 DIAGNOSIS — IMO0002 Reserved for concepts with insufficient information to code with codable children: Secondary | ICD-10-CM

## 2019-12-28 DIAGNOSIS — E1140 Type 2 diabetes mellitus with diabetic neuropathy, unspecified: Secondary | ICD-10-CM | POA: Diagnosis not present

## 2019-12-28 NOTE — Patient Instructions (Addendum)
Consider water rather than diet Pepsi most often. Consider a water cooler or keep water in the refrigerator. Consistency with meals. Balance of meals  1/2 your plate non starchy vegetables  1/4 plate protein  1/4 plate carbohydrates. Be active.  Aim for 4 Carb Choices per meal (60 grams) +/- 1 either way  Aim for 0-1 Carbs per snack if hungry  Include protein in moderation with your meals and snacks Consider reading food labels for Total Carbohydrate of foods Consider  increasing your activity level by walking or other activities for 30 or more minutes daily as tolerated Consider checking BG at alternate times per day  Consider taking medication as directed by MD  Consider asking your doctor about Dexcom or FreeStyle Libre Continuous Glucose Monitor - Medicare covers both with 3-4 injections of insulin daily.

## 2019-12-28 NOTE — Progress Notes (Signed)
Diabetes Self-Management Education  Visit Type: First/Initial  Appt. Start Time: 1415 Appt. End Time: 1345  12/29/2019  [name], identified by name and date of birth, is a 59 y.o. male with a diagnosis of Diabetes: Type 2.   ASSESSMENT Patient is here today with his girlfriend.  He would like to learn more about diabetes.  History includes Type 2 diabetes(2015), [name] (no C-pap since weight loss), HTN, dyslipidemia, CAD, CABGx5 2018, stents 2005. Complains of constipation with BM only every 2-3 days. Medications include:  Lantus 20 units q am, Humalog 7 units before each meal Previous medications:  Metformin (massive diarrhea), Toujeo "too much trouble to get" - He did not qualify for financial support when he got insurance.  He also, "didn't think that it was working" He states that he was on [place] in the past and does not remember why it was stopped as it did help.  He states that prior to his endocrinology visit 10/31/2019 he had been off of all of his medications for the past year as he did not feel that they were working and felt better when his blood sugar was high. Blood sugar had been running in the 300-500 range and has decreased to the 200-300 range.   He did report excellent blood sugar control when he was not eating for the colonoscopy prep. He experiences shaking and not feeling well when his blood sugar is below 200.   A1C 13.9% 10/31/19  Patient's girlfriend lives with him.  They share shopping and cooking.   He is on disability. Diet is often imbalanced.  He is trying to eat lower fat.  He drinks excessive amounts of diet Pepsi daily.  Gets very little exercise. He notes that he ate McDonald's daily for lunch when working and did drink more water at that time.   Height 5\' 11"  (1.803 m), weight 195 lb (88.5 kg). Body mass index is 27.2 kg/m.   Diabetes Self-Management Education - 12/28/19 1438      Visit Information   Visit Type First/Initial       Initial Visit   Diabetes Type Type 2    Are you currently following a meal plan? No    Are you taking your medications as prescribed? Yes    Date Diagnosed 2000      Health Coping   How would you rate your overall health? Fair      Psychosocial Assessment   Patient Belief/Attitude about Diabetes Defeat/Burnout    Self-care barriers None    Self-management support Doctor's office    Other persons present Patient    Patient Concerns Nutrition/Meal planning;Glycemic Control    Special Needs None    Preferred Learning Style No preference indicated    Learning Readiness Ready    How often do you need to have someone help you when you read instructions, pamphlets, or other written materials from your doctor or pharmacy? 1 - Never    What is the last grade level you completed in school? 12      Pre-Education Assessment   Patient understands the diabetes disease and treatment process. Needs Review    Patient understands incorporating nutritional management into lifestyle. Needs Review    Patient undertands incorporating physical activity into lifestyle. Needs Review    Patient understands using medications safely. Needs Review    Patient understands monitoring blood glucose, interpreting and using results Needs Review    Patient understands prevention, detection, and treatment of acute complications. Needs Review  Patient understands prevention, detection, and treatment of chronic complications. Needs Review    Patient understands how to develop strategies to address psychosocial issues. Needs Review    Patient understands how to develop strategies to promote health/change behavior. Needs Review      Complications   Last HgB A1C per patient/outside source 13.9 %   10/31/2019   How often do you check your blood sugar? 3-4 times/day    Fasting Blood glucose range (mg/dL) >176   160 this am, usually 300's   Postprandial Blood glucose range (mg/dL) >737   usually greater than 300,  occasionally 200's   Number of hypoglycemic episodes per month 0    Number of hyperglycemic episodes per week 21    Can you tell when your blood sugar is high? No    Have you had a dilated eye exam in the past 12 months? Yes    Have you had a dental exam in the past 12 months? No    Are you checking your feet? Yes    How many days per week are you checking your feet? 7      Dietary Intake   Breakfast Jimmie Dean Breakfast bowl OR grits    Lunch skips at times ("not hugry", "forget to eat") OR can of soup and ham sandwich OR can of vienna sausages    Snack (afternoon) occasional oreo cookies    Dinner air fried fish sticks OR baked fish, baked potato, vegetable    Snack (evening) occasional oreo cookies or pecans or walnuts    Beverage(s) 1-2 L diet Pepsi daily, 1-2 Beers daily, no water, 2/5 milk      Exercise   Exercise Type Light (walking / raking leaves)    How many days per week to you exercise? 1    How many minutes per day do you exercise? 30    Total minutes per week of exercise 30      Patient Education   Previous Diabetes Education Yes (please comment)   2015 Bev, CDE, RD   Disease state  Definition of diabetes, type 1 and 2, and the diagnosis of diabetes    Nutrition management  Role of diet in the treatment of diabetes and the relationship between the three main macronutrients and blood glucose level;Food label reading, portion sizes and measuring food.;Carbohydrate counting;Meal options for control of blood glucose level and chronic complications.;Information on hints to eating out and maintain blood glucose control.;Effects of alcohol on blood glucose and safety factors with consumption of alcohol.;Meal timing in regards to the patients' current diabetes medication.    Physical activity and exercise  Role of exercise on diabetes management, blood pressure control and cardiac health.;Helped patient identify appropriate exercises in relation to his/her diabetes, diabetes  complications and other health issue.    Medications Reviewed patients medication for diabetes, action, purpose, timing of dose and side effects.    Monitoring Taught/discussed recording of test results and interpretation of SMBG.;Identified appropriate SMBG and/or A1C goals.    Acute complications Taught treatment of hypoglycemia - the 15 rule.    Chronic complications Relationship between chronic complications and blood glucose control    Psychosocial adjustment Worked with patient to identify barriers to care and solutions;Role of stress on diabetes;Identified and addressed patients feelings and concerns about diabetes    Personal strategies to promote health Lifestyle issues that need to be addressed for better diabetes care      Individualized Goals (developed by patient)   Nutrition General  guidelines for healthy choices and portions discussed    Physical Activity Exercise 3-5 times per week;30 minutes per day    Medications take my medication as prescribed    Monitoring  test my blood glucose as discussed    Reducing Risk examine blood glucose patterns;treat hypoglycemia with 15 grams of carbs if blood glucose less than 70mg /dL;increase portions of healthy fats    Health Coping discuss diabetes with (comment)   MD, RD, CDCES     Post-Education Assessment   Patient understands the diabetes disease and treatment process. Demonstrates understanding / competency    Patient understands incorporating nutritional management into lifestyle. Needs Review    Patient undertands incorporating physical activity into lifestyle. Needs Review    Patient understands using medications safely. Needs Review    Patient understands monitoring blood glucose, interpreting and using results Needs Review    Patient understands prevention, detection, and treatment of acute complications. Demonstrates understanding / competency    Patient understands prevention, detection, and treatment of chronic complications.  Demonstrates understanding / competency    Patient understands how to develop strategies to address psychosocial issues. Needs Review    Patient understands how to develop strategies to promote health/change behavior. Needs Review      Outcomes   Expected Outcomes Other (comment)   demonstrated interest in learning but question change   Future DMSE 2 months    Program Status Completed      Subsequent Visit   Since your last visit have you continued or begun to take your medications as prescribed? Yes           Individualized Plan for Diabetes Self-Management Training:   Learning Objective:  Patient will have a greater understanding of diabetes self-management. Patient education plan is to attend individual and/or group sessions per assessed needs and concerns.   Plan:   Patient Instructions  Consider water rather than diet Pepsi most often. Consider a water cooler or keep water in the refrigerator. Consistency with meals. Balance of meals  1/2 your plate non starchy vegetables  1/4 plate protein  1/4 plate carbohydrates. Be active.  Aim for 4 Carb Choices per meal (60 grams) +/- 1 either way  Aim for 0-1 Carbs per snack if hungry  Include protein in moderation with your meals and snacks Consider reading food labels for Total Carbohydrate of foods Consider  increasing your activity level by walking or other activities for 30 or more minutes daily as tolerated Consider checking BG at alternate times per day  Consider taking medication as directed by MD      Expected Outcomes:  Other (comment) (demonstrated interest in learning but question change)  Education material provided: ADA - How to Thrive: A Guide for Your Journey with Diabetes, Food label handouts, Meal plan card, My Plate and Snack sheet  Mailed pamphlets on Dexcom and FreeStyle Libre CGM  If problems or questions, patient to contact team via:  Phone  Future DSME appointment: 2 months

## 2020-01-31 ENCOUNTER — Ambulatory Visit: Payer: Medicare Other | Admitting: Internal Medicine

## 2020-02-06 ENCOUNTER — Telehealth: Payer: Self-pay

## 2020-02-06 ENCOUNTER — Encounter: Payer: Self-pay | Admitting: Internal Medicine

## 2020-02-06 ENCOUNTER — Ambulatory Visit (INDEPENDENT_AMBULATORY_CARE_PROVIDER_SITE_OTHER): Payer: Medicare Other | Admitting: Internal Medicine

## 2020-02-06 ENCOUNTER — Other Ambulatory Visit: Payer: Self-pay

## 2020-02-06 VITALS — BP 148/88 | HR 84 | Ht 71.0 in | Wt 205.0 lb

## 2020-02-06 DIAGNOSIS — E1165 Type 2 diabetes mellitus with hyperglycemia: Secondary | ICD-10-CM | POA: Diagnosis not present

## 2020-02-06 LAB — POCT GLYCOSYLATED HEMOGLOBIN (HGB A1C): Hemoglobin A1C: 12.2 % — AB (ref 4.0–5.6)

## 2020-02-06 MED ORDER — FREESTYLE LIBRE 2 SENSOR MISC: 1.0000 | 11 refills | Status: DC

## 2020-02-06 MED ORDER — DEXCOM G6 TRANSMITTER MISC: 1.0000 | 3 refills | Status: DC

## 2020-02-06 MED ORDER — FREESTYLE LIBRE 2 READER DEVI: 1.0000 | 0 refills | Status: DC

## 2020-02-06 MED ORDER — DEXCOM G6 SENSOR MISC: 1.0000 | 11 refills | Status: DC

## 2020-02-06 NOTE — Progress Notes (Signed)
Name: [name] [name]  Age/ Sex: 59 y.o., male   MRN/ DOB: [id number], 1960/12/07     PCP: Leonard Downing, MD   Reason for Endocrinology Evaluation: Type 2 Diabetes Mellitus  Initial Endocrine Consultative Visit: 10/31/2019    PATIENT IDENTIFIER: [name] is a 59 y.o. male with a past medical history of T2Dm, [name], HTN, Dyslipidemia and CAD (S/P stents 2005 and CABG 2018). The patient has followed with Endocrinology clinic since 10/31/2019 for consultative assistance with management of his diabetes.  DIABETIC HISTORY:  Mr. Seelman was diagnosed with DM in 2015. Metformin caused massive diarrhea . His hemoglobin A1c has ranged from 8.9% in 2016, peaking at 13.7% in 2015.  On disability for heart and back issues    On his initial visit to our clinic he had an A1c 13.9 % . He was not on any medications by that time, we started MDI regimen.   GAD -65 and Iselt cell antibody levels were undetectable.   SUBJECTIVE:   During the last visit (10/31/2019): A1c 13.9 % , he was not on any hyperglycemic agents. We started MDI regimen.       Today (02/06/2020): Mr. Wahlen is here for a follow up on diabetes management.  He checks his blood sugars 3-5  times daily, preprandial to breakfast. The patient has not had hypoglycemic episodes since the last clinic visit.     HOME DIABETES REGIMEN:  Lantus 20 units daily  Humalog 10 units with each meal      Statin: Yes ACE-I/ARB: was prescribed but not taken    METER DOWNLOAD SUMMARY: Did not bring  Yesterday  AM - 260  Mg/dL   This am 460 mg/dL     DIABETIC COMPLICATIONS: Microvascular complications:   Mild retinopathy , neuropathy   Denies: CKD  Last eye exam: Completed 08/2019  Macrovascular complications:   CAD (S/p CABG 2018)  Denies: PVD, CVA    HISTORY:  Past Medical History:  Past Medical History:  Diagnosis Date  . Anxiety   . Chronic lower back pain   . Coronary artery disease 03/2004     3 stents put in   . Depression   . Fibromyalgia   . GERD (gastroesophageal reflux disease)   . History of hiatal hernia   . Hyperlipidemia LDL goal <70   . Hypertension   . Migraine    "weekly; back in the 1990s" (11/12/2016)  . Myocardial infarction ([place]) 2005  . [name] (obstructive sleep apnea)    "quit wearing my mask" (11/12/2016)  . Type II diabetes mellitus ([place]) 2010   Past Surgical History:  Past Surgical History:  Procedure Laterality Date  . CARDIAC CATHETERIZATION  11/12/2016  . CORONARY ANGIOPLASTY WITH STENT PLACEMENT  03/2004    x 3 stents  . CORONARY ARTERY BYPASS GRAFT N/A 11/17/2016   Procedure: CORONARY ARTERY BYPASS GRAFTING (CABG) x 5, USING RIGHT INTERNAL MAMMARY ARTERY AND RIGHT GREATER SAPHENOUS VEIN HARVESTED ENDOSCOPICALLY;  Surgeon: Ivin Poot, MD;  Location: [facility];  Service: Open Heart Surgery;  Laterality: N/A;  . INGUINAL HERNIA REPAIR Right 2000  . LEFT HEART CATH AND CORONARY ANGIOGRAPHY N/A 11/12/2016   Procedure: Left Heart Cath and Coronary Angiography;  Surgeon: Lorretta Harp, MD;  Location: [facility];  Service: Cardiovascular;  Laterality: N/A;  . TEE WITHOUT CARDIOVERSION N/A 11/17/2016   Procedure: TRANSESOPHAGEAL ECHOCARDIOGRAM (TEE);  Surgeon: Prescott Gum, Collier Salina, MD;  Location: [facility];  Service: Open Heart Surgery;  Laterality: N/A;    Social History:  reports that he has never smoked. He has never used smokeless tobacco. He reports current alcohol use of about 6.0 standard drinks of alcohol per week. He reports that he does not use drugs. Family History:  Family History  Problem Relation Age of Onset  . Diabetes Father   . Diabetes Sister   . Diabetes Paternal Grandfather   . Cancer Neg Hx      HOME MEDICATIONS: Allergies as of 02/06/2020      Reactions   No Known Allergies       Medication List       Accurate as of February 06, 2020 10:50 AM. If you have any questions, ask your nurse or doctor.        aspirin EC 81 MG  tablet Take 81 mg by mouth daily. Reported on 08/06/2015   glucose blood test strip 1 each by Other route 3 (three) times daily. Use Onetouch ultra test strips as instructed to check blood sugar 3-4 times daily.   HumaLOG KwikPen 100 UNIT/ML KwikPen Generic drug: insulin lispro Inject 10 Units into the skin 3 (three) times daily. What changed: Another medication with the same name was changed. Make sure you understand how and when to take each.   insulin lispro 100 UNIT/ML KwikPen Commonly known as: HumaLOG KwikPen Inject 0.07 mLs (7 Units total) into the skin 3 (three) times daily. What changed: how much to take   Insulin Pen Needle 32G X 4 MM Misc 1 Device by Does not apply route in the morning, at noon, in the evening, and at bedtime.   Lantus SoloStar 100 UNIT/ML Solostar Pen Generic drug: insulin glargine Inject 20 Units into the skin daily.   metoprolol tartrate 25 MG tablet Commonly known as: LOPRESSOR Take 1 tablet (25 mg total) by mouth 2 (two) times daily.   ONE TOUCH ULTRA 2 w/Device Kit 1 each by Does not apply route in the morning, at noon, in the evening, and at bedtime. Use OneTouch Ultra meter to check blood sugar 3-4 times daily.   OneTouch Delica Lancets 41U Misc 1 each by Does not apply route in the morning, at noon, in the evening, and at bedtime. Use Onetouch Delica lancets to check blood sugar 3-4 times daily.   rosuvastatin 40 MG tablet Commonly known as: CRESTOR Take 1 tablet (40 mg total) by mouth daily.        OBJECTIVE:   Vital Signs: BP (!) 148/88   Pulse 84   Ht 5' 11" (1.803 m)   Wt 205 lb (93 kg)   SpO2 97%   BMI 28.59 kg/m   Wt Readings from Last 3 Encounters:  02/06/20 205 lb (93 kg)  12/28/19 195 lb (88.5 kg)  10/31/19 188 lb 9.6 oz (85.5 kg)    Exam: General: Pt appears well and is in NAD  Neck: General: Supple without adenopathy. Thyroid: Thyroid size normal.  No goiter or nodules appreciated. No thyroid bruit.  Lungs:  Clear with good BS bilat with no rales, rhonchi, or wheezes  Heart: RRR with normal S1 and S2 and no gallops; no murmurs; no rub  Abdomen: Normoactive bowel sounds, soft, nontender, without masses or organomegaly palpable  Extremities: No pretibial edema.   Neuro: MS is good with appropriate affect, pt is alert and Ox3    DM foot exam:  02/06/2020     The skin of the feet is intact without sores or ulcerations. The pedal pulses  are 1+ on right and 1+ on left. The sensation is intact to a screening 5.07, 10 gram monofilament bilaterally    DATA REVIEWED:  Lab Results  Component Value Date   HGBA1C 13.9 (A) 10/31/2019   HGBA1C 12.0 (H) 11/13/2016   HGBA1C 10.7 11/15/2015   Lab Results  Component Value Date   MICROALBUR 2.7 (H) 10/31/2019   LDLCALC 36 11/13/2016   CREATININE 0.85 10/31/2019   Lab Results  Component Value Date   MICRALBCREAT 6.0 10/31/2019     Lab Results  Component Value Date   CHOL 111 11/13/2016   HDL 26 (L) 11/13/2016   LDLCALC 36 11/13/2016   TRIG 244 (H) 11/13/2016   CHOLHDL 4.3 11/13/2016         ASSESSMENT / PLAN / RECOMMENDATIONS:   1) Type 2 Diabetes Mellitus, Poorly controlled, With Neuropathic and Macrovascular complications - Most recent A1c of 12.2 % %. Goal A1c < 7.0 %.    - A1c down from 13.9% but he continues with hyperglycemia. He forgot his meter today.  - Pt will be a great candidate for SGLT-2 inhibitors , this will be considered in the future, he is considering changing insurance company by next year.  - He is intolerant to Metformin  but would like to give it a try  - Will adjust his medication as below  - Pt will also be provided with a correction scale so he can adjust his prandial dose accordingly  - Dexcom is not covered by his insurance will try prescribing Freestyle libre     MEDICATIONS: Increase Lantus to 24 units daily  Increase Humalog to 14 units with each meal  Start Metformin 500 mg XR daily   EDUCATION /  INSTRUCTIONS:  BG monitoring instructions: Patient is instructed to check his blood sugars 4 times a day, before meals and bedtime   Call [facility] if: BG persistently < 70  . I reviewed the Rule of 15 for the treatment of hypoglycemia in detail with the patient. Literature supplied.    2) Diabetic complications:   Eye: Does not have known diabetic retinopathy.   Neuro/ Feet: Does have known diabetic peripheral neuropathy .   Renal: Patient does not have known baseline CKD. He   is not on an ACEI/ARB at present.     F/U in 4 months    Signed electronically by: Mack Guise, MD  [facility]  [facility] [address] [place], [place] [address] Phone: [phone] FAX: [phone]   CC: Leonard Downing, MD [place] [address] Phone: [phone]  Fax: [phone]  Return to Endocrinology clinic as below: Future Appointments  Date Time Provider [place]  03/11/2020  9:00 AM Valora Piccolo, Barnabas Lister, RD [place] NDM

## 2020-02-06 NOTE — Patient Instructions (Addendum)
-   Increase Lantus to 24 units daily  - Increase Humalog to 14 units with each meal   - Humalog correctional insulin: ADD extra units on insulin to your meal-time Humalog dose if your blood sugars are higher than 160. Use the scale below to help guide you:   Blood sugar before meal Number of units to inject  Less than 160 0 unit  161 -  190 1 units  191 -  220 2 units  221 -  250 3 units  251 -  280 4 units  281 -  310 5 units  311 -  340 6 units  341 -  370 7 units  371 -  400 8 units  401- 430 9 units              HOW TO TREAT LOW BLOOD SUGARS (Blood sugar LESS THAN 70 MG/DL)  Please follow the RULE OF 15 for the treatment of hypoglycemia treatment (when your (blood sugars are less than 70 mg/dL)    STEP 1: Take 15 grams of carbohydrates when your blood sugar is low, which includes:   3-4 GLUCOSE TABS  OR  3-4 OZ OF JUICE OR REGULAR SODA OR  ONE TUBE OF GLUCOSE GEL     STEP 2: RECHECK blood sugar in 15 MINUTES STEP 3: If your blood sugar is still low at the 15 minute recheck --> then, go back to STEP 1 and treat AGAIN with another 15 grams of carbohydrates.

## 2020-02-06 NOTE — Telephone Encounter (Signed)
[facility] stating [facility]com is not covered by patients insurance.  How would you like to proceed?

## 2020-02-07 LAB — HEPATIC FUNCTION PANEL
ALT: 21 IU/L (ref 0–44)
AST: 21 IU/L (ref 0–40)
Albumin: 4.5 g/dL (ref 3.8–4.9)
Alkaline Phosphatase: 90 IU/L (ref 44–121)
Bilirubin Total: 0.6 mg/dL (ref 0.0–1.2)
Bilirubin, Direct: 0.18 mg/dL (ref 0.00–0.40)
Total Protein: 6.9 g/dL (ref 6.0–8.5)

## 2020-02-07 LAB — LIPID PANEL
Chol/HDL Ratio: 3.4 ratio (ref 0.0–5.0)
Cholesterol, Total: 111 mg/dL (ref 100–199)
HDL: 33 mg/dL — ABNORMAL LOW (ref >39)
LDL Chol Calc (NIH): 55 mg/dL (ref 0–99)
Triglycerides: 132 mg/dL (ref 0–149)
VLDL Cholesterol Cal: 23 mg/dL (ref 5–40)

## 2020-02-07 MED ORDER — INSULIN LISPRO (1 UNIT DIAL) 100 UNIT/ML (KWIKPEN): 14.0000 [IU] | PEN_INJECTOR | Freq: Three times a day (TID) | SUBCUTANEOUS | 6 refills | Status: DC

## 2020-02-07 MED ORDER — METFORMIN HCL ER 500 MG PO TB24: 500.0000 mg | ORAL_TABLET | Freq: Every day | ORAL | 2 refills | Status: DC

## 2020-02-07 MED ORDER — LANTUS SOLOSTAR 100 UNIT/ML ~~LOC~~ SOPN: 24.0000 [IU] | PEN_INJECTOR | Freq: Every day | SUBCUTANEOUS | 11 refills | Status: DC

## 2020-02-13 ENCOUNTER — Other Ambulatory Visit: Payer: Self-pay

## 2020-02-13 DIAGNOSIS — IMO0002 Reserved for concepts with insufficient information to code with codable children: Secondary | ICD-10-CM

## 2020-02-13 DIAGNOSIS — E1165 Type 2 diabetes mellitus with hyperglycemia: Secondary | ICD-10-CM

## 2020-02-13 DIAGNOSIS — E1140 Type 2 diabetes mellitus with diabetic neuropathy, unspecified: Secondary | ICD-10-CM

## 2020-02-13 MED ORDER — FREESTYLE LIBRE 2 READER DEVI: 0 refills | Status: DC

## 2020-02-13 MED ORDER — DEXCOM G6 TRANSMITTER MISC: 6 refills | Status: AC

## 2020-02-13 MED ORDER — DEXCOM G6 RECEIVER DEVI: 6 refills | Status: DC

## 2020-02-13 MED ORDER — FREESTYLE LIBRE 2 SENSOR MISC: 3 refills | Status: DC

## 2020-02-13 NOTE — Progress Notes (Signed)
Prior auth needed for [employer].  Requested.

## 2020-03-11 ENCOUNTER — Encounter: Payer: Medicare Other | Attending: Internal Medicine | Admitting: Dietician

## 2020-03-11 ENCOUNTER — Other Ambulatory Visit: Payer: Self-pay

## 2020-03-11 ENCOUNTER — Encounter: Payer: Self-pay | Admitting: Dietician

## 2020-03-11 DIAGNOSIS — E1142 Type 2 diabetes mellitus with diabetic polyneuropathy: Secondary | ICD-10-CM | POA: Diagnosis present

## 2020-03-11 NOTE — Patient Instructions (Addendum)
Consider counseling Find a Purpose.  Something to bring you joy. Aim to be active most days.  Aim for 30 minutes per day.    Choose an adventure a week  Consider buying a bike Choose a vegetable every meal  Eat a salad ever lunch Decrease fat  Water rather than Diet Pepsi most often.  Buy a water fountain or keep pitcher in the refreigerator  [email or web address] for recipes American diabetes association

## 2020-03-11 NOTE — Progress Notes (Signed)
Diabetes Self-Management Education  Visit Type:  Follow-up  Appt. Start Time: 0915 Appt. End Time: 10  03/11/2020  [name] [name], identified by name and date of birth, is a 59 y.o. male with a diagnosis of Diabetes: Type 2.   ASSESSMENT Patient is here today alone. He states that he has no joy, urgency, or motivation for anything - no purpose.  States that he thinks he has depression.  Has not been off his property in the past 2 weeks.  Can't keep track of the day of the week.  He wishes that he could be on a cruise ship all of the time and have 3 meals supplied.  States that he was on an antidepressant in the past and that it did not seem to work.   Mental Health Screen score was 15.  No thoughts of harming himself.  He is receptive to counseling and a list was provided that may work with his insurance.   Meals remain high in fat and low in vegetables, fruits, and whole grains. He reduced his diet Pepsi intake from 2 Liters daily to 1 liter daily but has not increased his water much.  Drinks 0-2 beers daily. Not skipping meals as much.  States that he is always hungry.  History includes Type 2 diabetes(2015), [name] (no C-pap since weight loss), HTN, dyslipidemia, CAD, CABGx5 2018, stents 2005. Complains of constipation with BM only every 2-3 days. Medications include:  Lantus 30 units q am, Humalog 20-30 units before each meal (based on sliding scale), Metformin (now tolerating) Previous medications:  Metformin (massive diarrhea), Toujeo "too much trouble to get" - He did not qualify for financial support when he got insurance.  He also, "didn't think that it was working" He states that he was on [place] in the past and does not remember why it was stopped as it did help. Weight 208 lbs increased from 205 lbs 2 months ago.  Dexcom not covered by insurance.  Has the [facility] but is waiting on the reader to start.  He states that prior to his endocrinology visit 10/31/2019 he had  been off of all of his medications for the past year as he did not feel that they were working and felt better when his blood sugar was high. Blood sugar had been running in the 300-500 range and has decreased to the 200-300 range.  Fasting blood sugar this am 235 lbs He did report excellent blood sugar control when he was not eating for the colonoscopy prep. He experiences shaking and not feeling well when his blood sugar is below 200.   A1C 13.9% 10/31/19, 12.2% 02/06/2020  GAD and Islet cell antibody levels were undetectable.   Patient's girlfriend lives with him.  They share shopping and cooking.   He is on disability. Diet is often imbalanced.  He is trying to eat lower fat.  He drinks excessive amounts of diet Pepsi daily.   Gets very little exercise.  Does work in his yard. He notes that he ate McDonald's daily for lunch when working and did drink more water at that time.     Diabetes Self-Management Education - 03/11/20 1100      Psychosocial Assessment   Patient Belief/Attitude about Diabetes Defeat/Burnout    Self-care barriers Other (comment)   depression   Self-management support Doctor's office    Patient Concerns Glycemic Control    Special Needs None    Preferred Learning Style No preference indicated  Pre-Education Assessment   Patient understands the diabetes disease and treatment process. Needs Review    Patient understands incorporating nutritional management into lifestyle. Needs Review    Patient undertands incorporating physical activity into lifestyle. Needs Review    Patient understands using medications safely. Needs Review    Patient understands monitoring blood glucose, interpreting and using results Needs Review    Patient understands prevention, detection, and treatment of acute complications. Needs Review    Patient understands prevention, detection, and treatment of chronic complications. Needs Review    Patient understands how to develop strategies  to address psychosocial issues. Needs Review    Patient understands how to develop strategies to promote health/change behavior. Needs Review      Complications   Last HgB A1C per patient/outside source 12.2 %   02/06/2020   How often do you check your blood sugar? 3-4 times / week    Fasting Blood glucose range (mg/dL) >017    Postprandial Blood glucose range (mg/dL) >510    Number of hypoglycemic episodes per month 0    Number of hyperglycemic episodes per week 21      Dietary Intake   Breakfast 2 eggs, 2 sausage, 2 biscuits OR Jimmie Dean Breakfast Bowl    Lunch Ham and cheese sandwich    Dinner meat, sweet potato    Beverage(s) 1 L diet pepsi, 0-2 beer, little water      Exercise   Exercise Type Light (walking / raking leaves)    How many days per week to you exercise? 1    How many minutes per day do you exercise? 30    Total minutes per week of exercise 30      Patient Education   Previous Diabetes Education Yes (please comment)   12/2019 [facility]   Nutrition management  Meal options for control of blood glucose level and chronic complications.    Physical activity and exercise  Role of exercise on diabetes management, blood pressure control and cardiac health.    Medications Reviewed patients medication for diabetes, action, purpose, timing of dose and side effects.    Monitoring Other (comment)   CGM    Psychosocial adjustment Worked with patient to identify barriers to care and solutions;Identified and addressed patients feelings and concerns about diabetes;Other (comment)   depression and resources   Personal strategies to promote health Lifestyle issues that need to be addressed for better diabetes care      Individualized Goals (developed by patient)   Nutrition General guidelines for healthy choices and portions discussed    Physical Activity Exercise 3-5 times per week;15 minutes per day    Medications take my medication as prescribed    Monitoring  test my blood glucose as  discussed    Reducing Risk examine blood glucose patterns;increase portions of healthy fats    Health Coping discuss diabetes with (comment)   MD, RD, CDCES, Counselor     Patient Self-Evaluation of Goals - Patient rates self as meeting previously set goals (% of time)   Nutrition < 25%    Physical Activity < 25%    Medications >75%    Monitoring >75%    Problem Solving 25 - 50%    Reducing Risk 25 - 50%    Health Coping 25 - 50%      Post-Education Assessment   Patient understands the diabetes disease and treatment process. Demonstrates understanding / competency    Patient understands incorporating nutritional management into lifestyle. Needs Review  Patient undertands incorporating physical activity into lifestyle. Needs Review    Patient understands using medications safely. Demonstrates understanding / competency    Patient understands monitoring blood glucose, interpreting and using results Needs Review    Patient understands prevention, detection, and treatment of acute complications. Demonstrates understanding / competency    Patient understands prevention, detection, and treatment of chronic complications. Demonstrates understanding / competency    Patient understands how to develop strategies to address psychosocial issues. Needs Review    Patient understands how to develop strategies to promote health/change behavior. Needs Review      Outcomes   Program Status Not Completed      Subsequent Visit   Since your last visit have you continued or begun to take your medications as prescribed? Yes    Since your last visit have you experienced any weight changes? Gain    Weight Gain (lbs) 3           Learning Objective:  Patient will have a greater understanding of diabetes self-management. Patient education plan is to attend individual and/or group sessions per assessed needs and concerns.   Plan:   Patient Instructions  Consider counseling Find a Purpose.  Something  to bring you joy. Aim to be active most days.  Aim for 30 minutes per day.    Choose an adventure a week  Consider buying a bike Choose a vegetable every meal  Eat a salad ever lunch Decrease fat  Water rather than Diet Pepsi most often.  Buy a water fountain or keep pitcher in the refreigerator  [email or web address] for recipes American diabetes association     Expected Outcomes:  Other (comment) (interest in learning but barriers)  Education material provided: counseling list  If problems or questions, patient to contact team via:  Phone  Future DSME appointment: - 2 months

## 2020-03-26 ENCOUNTER — Telehealth: Payer: Self-pay | Admitting: Internal Medicine

## 2020-03-26 DIAGNOSIS — IMO0002 Reserved for concepts with insufficient information to code with codable children: Secondary | ICD-10-CM

## 2020-03-26 DIAGNOSIS — E1140 Type 2 diabetes mellitus with diabetic neuropathy, unspecified: Secondary | ICD-10-CM

## 2020-03-26 DIAGNOSIS — E1165 Type 2 diabetes mellitus with hyperglycemia: Secondary | ICD-10-CM

## 2020-03-26 NOTE — Telephone Encounter (Signed)
[facility] called and requested that we resend FREESTYLE LIBRE 2 READER Prescription. They received sensors but no readers

## 2020-03-29 MED ORDER — FREESTYLE LIBRE 2 READER DEVI: 1 refills | Status: DC

## 2020-03-29 NOTE — Telephone Encounter (Signed)
Sent as requested.

## 2020-04-01 ENCOUNTER — Telehealth: Payer: Self-pay | Admitting: Internal Medicine

## 2020-04-01 MED ORDER — INSULIN LISPRO (1 UNIT DIAL) 100 UNIT/ML (KWIKPEN): 14.0000 [IU] | PEN_INJECTOR | Freq: Three times a day (TID) | SUBCUTANEOUS | 1 refills | Status: DC

## 2020-04-01 NOTE — Telephone Encounter (Signed)
Patient called to request that a new RX for Humalog Flex Pen, reflecting new dosing instructions [facility] [address] in [place].  Per Patient RX is for 90 day

## 2020-04-01 NOTE — Telephone Encounter (Signed)
Spoken to patient to confirm dosage is the same as last appointment on 02/06/2020  Sent refill sent as requested.

## 2020-05-14 ENCOUNTER — Encounter: Payer: Self-pay | Admitting: Internal Medicine

## 2020-05-14 ENCOUNTER — Ambulatory Visit: Payer: Medicare Other | Admitting: Internal Medicine

## 2020-05-14 ENCOUNTER — Other Ambulatory Visit: Payer: Self-pay

## 2020-05-14 VITALS — BP 128/80 | HR 70 | Ht 71.0 in | Wt 217.5 lb

## 2020-05-14 DIAGNOSIS — E1165 Type 2 diabetes mellitus with hyperglycemia: Secondary | ICD-10-CM

## 2020-05-14 LAB — POCT GLYCOSYLATED HEMOGLOBIN (HGB A1C): Hemoglobin A1C: 8.9 % — AB (ref 4.0–5.6)

## 2020-05-14 MED ORDER — DAPAGLIFLOZIN PROPANEDIOL 5 MG PO TABS: 5.0000 mg | ORAL_TABLET | Freq: Every day | ORAL | 6 refills | Status: DC

## 2020-05-14 MED ORDER — HUMALOG KWIKPEN 200 UNIT/ML ~~LOC~~ SOPN: PEN_INJECTOR | SUBCUTANEOUS | 3 refills | Status: DC

## 2020-05-14 MED ORDER — METFORMIN HCL ER 500 MG PO TB24
500.0000 mg | ORAL_TABLET | Freq: Two times a day (BID) | ORAL | 3 refills | Status: DC
Start: 2020-05-14 — End: 2020-09-10

## 2020-05-14 NOTE — Progress Notes (Signed)
Name: [name] [name]  Age/ Sex: 60 y.o., male   MRN/ DOB: [id number], May 05, 1961     PCP: Leonard Downing, MD   Reason for Endocrinology Evaluation: Type 2 Diabetes Mellitus  Initial Endocrine Consultative Visit: 10/31/2019    PATIENT IDENTIFIER: [name] [name] is a 60 y.o. male with a past medical history of T2Dm, [name], HTN, Dyslipidemia and CAD (S/P stents 2005 and CABG 2018). The patient has followed with Endocrinology clinic since 10/31/2019 for consultative assistance with management of his diabetes.  DIABETIC HISTORY:  [name] [name] was diagnosed with DM in 2015. Metformin caused massive diarrhea . His hemoglobin A1c has ranged from 8.9% in 2016, peaking at 13.7% in 2015.  On disability for heart and back issues    On his initial visit to our clinic he had an A1c 13.9 % . He was not on any medications by that time, we started MDI regimen.   GAD -65 and Iselt cell antibody levels were undetectable.   SUBJECTIVE:   During the last visit (02/06/2020): A1c 12.2 % , he was not on any hyperglycemic agents. We started MDI regimen.     Today (05/14/2020): [name] [name] is here for a follow up on diabetes management.  He checks his blood sugars multiple times a day through Dexcom.. The patient has not had hypoglycemic episodes since the last clinic visit.   Denies nausea or diarrhea  HOME DIABETES REGIMEN:  Lantus 24 units daily  Humalog 14 units with each meal  Metformin 500 mg XR      Statin: Yes ACE-I/ARB: was prescribed but not taken     CONTINUOUS GLUCOSE MONITORING RECORD INTERPRETATION    Dates of Recording: 12/22-05/14/2020  Sensor description:Dexcom  Results statistics:   CGM use % of time 93  Average and SD 214/59  Time in range  31      %  % Time Above 180 43  % Time above 250 25  % Time Below target 0     Glycemic patterns summary: hyperglycemia noted during the day   Hyperglycemic episodes  Postprandial   Hypoglycemic episodes occurred  n/a  Overnight periods: within goal      DIABETIC COMPLICATIONS: Microvascular complications:   Mild retinopathy , neuropathy   Denies: CKD  Last eye exam: Completed 08/2019     Macrovascular complications:   CAD (S/p CABG 2018)  Denies: PVD, CVA    HISTORY:  Past Medical History:  Past Medical History:  Diagnosis Date  . Anxiety   . Chronic lower back pain   . Coronary artery disease 03/2004    3 stents put in   . Depression   . Fibromyalgia   . GERD (gastroesophageal reflux disease)   . History of hiatal hernia   . Hyperlipidemia LDL goal <70   . Hypertension   . Migraine    "weekly; back in the 1990s" (11/12/2016)  . Myocardial infarction ([place]) 2005  . [name] (obstructive sleep apnea)    "quit wearing my mask" (11/12/2016)  . Type II diabetes mellitus ([place]) 2010   Past Surgical History:  Past Surgical History:  Procedure Laterality Date  . CARDIAC CATHETERIZATION  11/12/2016  . CORONARY ANGIOPLASTY WITH STENT PLACEMENT  03/2004    x 3 stents  . CORONARY ARTERY BYPASS GRAFT N/A 11/17/2016   Procedure: CORONARY ARTERY BYPASS GRAFTING (CABG) x 5, USING RIGHT INTERNAL MAMMARY ARTERY AND RIGHT GREATER SAPHENOUS VEIN HARVESTED ENDOSCOPICALLY;  Surgeon: Ivin Poot, MD;  Location: [facility];  Service: Open Heart Surgery;  Laterality: N/A;  . INGUINAL HERNIA REPAIR Right 2000  . LEFT HEART CATH AND CORONARY ANGIOGRAPHY N/A 11/12/2016   Procedure: Left Heart Cath and Coronary Angiography;  Surgeon: Lorretta Harp, MD;  Location: [facility];  Service: Cardiovascular;  Laterality: N/A;  . TEE WITHOUT CARDIOVERSION N/A 11/17/2016   Procedure: TRANSESOPHAGEAL ECHOCARDIOGRAM (TEE);  Surgeon: Prescott Gum, Collier Salina, MD;  Location: [facility];  Service: Open Heart Surgery;  Laterality: N/A;    Social History:  reports that he has never smoked. He has never used smokeless tobacco. He reports current alcohol use of about 6.0 standard drinks of alcohol per week. He reports that  he does not use drugs. Family History:  Family History  Problem Relation Age of Onset  . Diabetes Father   . Diabetes Sister   . Diabetes Paternal Grandfather   . Cancer Neg Hx      HOME MEDICATIONS: Allergies as of 05/14/2020      Reactions   No Known Allergies       Medication List       Accurate as of May 14, 2020 12:14 PM. If you have any questions, ask your nurse or doctor.        STOP taking these medications   FreeStyle Libre 2 Reader Kerrin Mo Stopped by: Dorita Sciara, MD   FreeStyle Libre 2 Sensor Misc Stopped by: Dorita Sciara, MD     TAKE these medications   aspirin EC 81 MG tablet Take 81 mg by mouth daily. Reported on 08/06/2015   dapagliflozin propanediol 5 MG Tabs tablet Commonly known as: Farxiga Take 1 tablet (5 mg total) by mouth daily before breakfast. Started by: Dorita Sciara, MD   Dexcom G6 Transmitter Misc Change every 10 days as needed/directed   glucose blood test strip 1 each by Other route 3 (three) times daily. Use Onetouch ultra test strips as instructed to check blood sugar 3-4 times daily.   insulin lispro 100 UNIT/ML KwikPen Commonly known as: HumaLOG KwikPen Inject 14 Units into the skin 3 (three) times daily. Max daily 75 units   Insulin Pen Needle 32G X 4 MM Misc 1 Device by Does not apply route in the morning, at noon, in the evening, and at bedtime.   Lantus SoloStar 100 UNIT/ML Solostar Pen Generic drug: insulin glargine Inject 24 Units into the skin daily.   metFORMIN 500 MG 24 hr tablet Commonly known as: GLUCOPHAGE-XR Take 1 tablet (500 mg total) by mouth 2 (two) times daily. What changed: when to take this Changed by: Dorita Sciara, MD   metoprolol tartrate 25 MG tablet Commonly known as: LOPRESSOR Take 1 tablet (25 mg total) by mouth 2 (two) times daily.   ONE TOUCH ULTRA 2 w/Device Kit 1 each by Does not apply route in the morning, at noon, in the evening, and at bedtime. Use  OneTouch Ultra meter to check blood sugar 3-4 times daily.   OneTouch Delica Lancets 55D Misc 1 each by Does not apply route in the morning, at noon, in the evening, and at bedtime. Use Onetouch Delica lancets to check blood sugar 3-4 times daily.   rosuvastatin 40 MG tablet Commonly known as: CRESTOR Take 1 tablet (40 mg total) by mouth daily.        OBJECTIVE:   Vital Signs: BP 128/80   Pulse 70   Ht 5' 11"  (1.803 m)   Wt 217 lb 8 oz (98.7 kg)  SpO2 97%   BMI 30.34 kg/m   Wt Readings from Last 3 Encounters:  05/14/20 217 lb 8 oz (98.7 kg)  02/06/20 205 lb (93 kg)  12/28/19 195 lb (88.5 kg)    Exam: General: Pt appears well and is in NAD  Lungs: Clear with good BS bilat with no rales, rhonchi, or wheezes  Heart: RRR   Extremities: No pretibial edema.   Neuro: MS is good with appropriate affect, pt is alert and Ox3    DM foot exam:  02/06/2020     The skin of the feet is intact without sores or ulcerations. The pedal pulses are 1+ on right and 1+ on left. The sensation is intact to a screening 5.07, 10 gram monofilament bilaterally    DATA REVIEWED:  Lab Results  Component Value Date   HGBA1C 8.9 (A) 05/14/2020   HGBA1C 12.2 (A) 02/06/2020   HGBA1C 13.9 (A) 10/31/2019   Lab Results  Component Value Date   MICROALBUR 2.7 (H) 10/31/2019   LDLCALC 55 02/06/2020   CREATININE 0.85 10/31/2019   Lab Results  Component Value Date   MICRALBCREAT 6.0 10/31/2019     Lab Results  Component Value Date   CHOL 111 02/06/2020   HDL 33 (L) 02/06/2020   LDLCALC 55 02/06/2020   TRIG 132 02/06/2020   CHOLHDL 3.4 02/06/2020         ASSESSMENT / PLAN / RECOMMENDATIONS:   1) Type 2 Diabetes Mellitus, Poorly controlled, With Neuropathic and Macrovascular complications - Most recent A1c of 8.9 % %. Goal A1c < 7.0 %.    - A1c down from 12.2% , I have praised his efforts - Pt is a great candidate for SGLT-2 inhibitors , he used to be on it in the past with no  issues, will restart  - Tolerating singly dose of metformin, will increase    MEDICATIONS: Continue  Lantus 24 units daily  Increase Humalog to 16 units with each meal  Increase Metformin 500 mg XR BID Start Farxiga 5 mg , 1 tablet daily   EDUCATION / INSTRUCTIONS:  BG monitoring instructions: Patient is instructed to check his blood sugars 4 times a day, before meals and bedtime   Call [name] if: BG persistently < 70  . I reviewed the Rule of 15 for the treatment of hypoglycemia in detail with the patient. Literature supplied.    2) Diabetic complications:   Eye: Does not have known diabetic retinopathy.   Neuro/ Feet: Does have known diabetic peripheral neuropathy .   Renal: Patient does not have known baseline CKD. He   is not on an ACEI/ARB at present.     F/U in 4 months    Signed electronically by: Mack Guise, MD  [facility] End[facility]in[facility]  [facility] [address] [place], [place] [address] Phone: [phone] FAX: [phone]   CC: Leonard Downing, MD [place] [address] Phone: [phone]  Fax: [phone]  Return to Endocrinology clinic as below: Future Appointments  Date Time Provider [place]  05/20/2020 10:00 AM Clydell Hakim, RD [place] NDM  09/10/2020 11:10 AM Sumeya Yontz, Melanie Crazier, MD [facility]

## 2020-05-14 NOTE — Patient Instructions (Addendum)
-   Continue Lantus to 24 units daily  - Increase Humalog to 16 units with each meal  - Increase Metformin 500 mg to Twice daily  - Start Farxiga 5 mg, 1 tablet with Breakfast   - Humalog correctional insulin: ADD extra units on insulin to your meal-time Humalog dose if your blood sugars are higher than 160. Use the scale below to help guide you:   Blood sugar before meal Number of units to inject  Less than 160 0 unit  161 -  190 1 units  191 -  220 2 units  221 -  250 3 units  251 -  280 4 units  281 -  310 5 units  311 -  340 6 units  341 -  370 7 units  371 -  400 8 units  401-  430 9 units              HOW TO TREAT LOW BLOOD SUGARS (Blood sugar LESS THAN 70 MG/DL)  Please follow the RULE OF 15 for the treatment of hypoglycemia treatment (when your (blood sugars are less than 70 mg/dL)    STEP 1: Take 15 grams of carbohydrates when your blood sugar is low, which includes:   3-4 GLUCOSE TABS  OR  3-4 OZ OF JUICE OR REGULAR SODA OR  ONE TUBE OF GLUCOSE GEL     STEP 2: RECHECK blood sugar in 15 MINUTES STEP 3: If your blood sugar is still low at the 15 minute recheck --> then, go back to STEP 1 and treat AGAIN with another 15 grams of carbohydrates.

## 2020-05-20 ENCOUNTER — Ambulatory Visit: Payer: Medicare Other | Admitting: Dietician

## 2020-09-10 ENCOUNTER — Other Ambulatory Visit: Payer: Self-pay

## 2020-09-10 ENCOUNTER — Encounter: Payer: Self-pay | Admitting: Internal Medicine

## 2020-09-10 ENCOUNTER — Ambulatory Visit (INDEPENDENT_AMBULATORY_CARE_PROVIDER_SITE_OTHER): Payer: Medicare Other | Admitting: Internal Medicine

## 2020-09-10 VITALS — BP 126/78 | HR 69 | Ht 71.0 in | Wt 217.2 lb

## 2020-09-10 DIAGNOSIS — E1159 Type 2 diabetes mellitus with other circulatory complications: Secondary | ICD-10-CM

## 2020-09-10 DIAGNOSIS — E1165 Type 2 diabetes mellitus with hyperglycemia: Secondary | ICD-10-CM | POA: Diagnosis not present

## 2020-09-10 DIAGNOSIS — E1142 Type 2 diabetes mellitus with diabetic polyneuropathy: Secondary | ICD-10-CM | POA: Diagnosis not present

## 2020-09-10 LAB — POCT GLYCOSYLATED HEMOGLOBIN (HGB A1C): Hemoglobin A1C: 8.4 % — AB (ref 4.0–5.6)

## 2020-09-10 MED ORDER — LANTUS SOLOSTAR 100 UNIT/ML ~~LOC~~ SOPN: 30.0000 [IU] | PEN_INJECTOR | Freq: Every day | SUBCUTANEOUS | 3 refills | Status: DC

## 2020-09-10 MED ORDER — DAPAGLIFLOZIN PROPANEDIOL 5 MG PO TABS: 5.0000 mg | ORAL_TABLET | Freq: Every day | ORAL | 1 refills | Status: DC

## 2020-09-10 NOTE — Progress Notes (Signed)
Name: [name] [name]  Age/ Sex: 60 y.o., male   MRN/ DOB: [id number], July 03, 1960     PCP: Leonard Downing, MD   Reason for Endocrinology Evaluation: Type 2 Diabetes Mellitus  Initial Endocrine Consultative Visit: 10/31/2019    PATIENT IDENTIFIER: [name] is a 60 y.o. male with a past medical history of T2Dm, [name], HTN, Dyslipidemia and CAD (S/P stents 2005 and CABG 2018). The patient has followed with Endocrinology clinic since 10/31/2019 for consultative assistance with management of his diabetes.  DIABETIC HISTORY:  [name] was diagnosed with DM in 2015. Metformin caused massive diarrhea . His hemoglobin A1c has ranged from 8.9% in 2016, peaking at 13.7% in 2015.  On disability for heart and back issues    On his initial visit to our clinic he had an A1c 13.9 % . He was not on any medications by that time, we started MDI regimen.    Attempted to restart metformin XR but stopped by 05/2020 due to diarrhea   GAD -65 and Iselt cell antibody levels were undetectable.   Sister with T1DM   SUBJECTIVE:   During the last visit (05/15/2019): A1c 8.9 % , We started Farxiga, increased metformin and adjusted MDi regimen.    Today (09/10/2020): Mr. Bi is here for a follow up on diabetes management.  He checks his blood sugars multiple times a day through Dexcom. The patient has not had hypoglycemic episodes since the last clinic visit.   Denies nausea or diarrhea Going on a cruise in the near future  He described right foot pain when he initially steps on  The foot but this eases off the more he walks on it.   HOME DIABETES REGIMEN:  Lantus 24 units daily  Humalog 16 units with each meal  Metformin 500 mg XR BID- not taking  Farxiga 5 mg daily      Statin: Yes ACE-I/ARB: was prescribed but not taken     CONTINUOUS GLUCOSE MONITORING RECORD INTERPRETATION : Unable to download        DIABETIC COMPLICATIONS: Microvascular complications:   Mild  retinopathy , neuropathy   Denies: CKD  Last eye exam: Completed 08/2020     Macrovascular complications:   CAD (S/p CABG 2018)  Denies: PVD, CVA    HISTORY:  Past Medical History:  Past Medical History:  Diagnosis Date  . Anxiety   . Chronic lower back pain   . Coronary artery disease 03/2004    3 stents put in   . Depression   . Fibromyalgia   . GERD (gastroesophageal reflux disease)   . History of hiatal hernia   . Hyperlipidemia LDL goal <70   . Hypertension   . Migraine    "weekly; back in the 1990s" (11/12/2016)  . Myocardial infarction ([place]) 2005  . [name] (obstructive sleep apnea)    "quit wearing my mask" (11/12/2016)  . Type II diabetes mellitus ([place]) 2010   Past Surgical History:  Past Surgical History:  Procedure Laterality Date  . CARDIAC CATHETERIZATION  11/12/2016  . CORONARY ANGIOPLASTY WITH STENT PLACEMENT  03/2004    x 3 stents  . CORONARY ARTERY BYPASS GRAFT N/A 11/17/2016   Procedure: CORONARY ARTERY BYPASS GRAFTING (CABG) x 5, USING RIGHT INTERNAL MAMMARY ARTERY AND RIGHT GREATER SAPHENOUS VEIN HARVESTED ENDOSCOPICALLY;  Surgeon: Ivin Poot, MD;  Location: [facility];  Service: Open Heart Surgery;  Laterality: N/A;  . INGUINAL HERNIA REPAIR Right 2000  . LEFT HEART CATH AND CORONARY  ANGIOGRAPHY N/A 11/12/2016   Procedure: Left Heart Cath and Coronary Angiography;  Surgeon: Lorretta Harp, MD;  Location: [facility];  Service: Cardiovascular;  Laterality: N/A;  . TEE WITHOUT CARDIOVERSION N/A 11/17/2016   Procedure: TRANSESOPHAGEAL ECHOCARDIOGRAM (TEE);  Surgeon: Prescott Gum, Collier Salina, MD;  Location: [facility];  Service: Open Heart Surgery;  Laterality: N/A;    Social History:  reports that he has never smoked. He has never used smokeless tobacco. He reports current alcohol use of about 6.0 standard drinks of alcohol per week. He reports that he does not use drugs. Family History:  Family History  Problem Relation Age of Onset  . Diabetes Father    . Diabetes Sister   . Diabetes Paternal Grandfather   . Cancer Neg Hx      HOME MEDICATIONS: Allergies as of 09/10/2020      Reactions   No Known Allergies       Medication List       Accurate as of Sep 10, 2020 11:08 AM. If you have any questions, ask your nurse or doctor.        STOP taking these medications   dapagliflozin propanediol 5 MG Tabs tablet Commonly known as: [place] Stopped by: Dorita Sciara, MD   metFORMIN 500 MG 24 hr tablet Commonly known as: GLUCOPHAGE-XR Stopped by: Dorita Sciara, MD   ONE TOUCH ULTRA 2 w/Device Kit Stopped by: Dorita Sciara, MD     TAKE these medications   aspirin EC 81 MG tablet Take 81 mg by mouth daily. Reported on 08/06/2015   Dexcom G6 Transmitter Misc Change every 10 days as needed/directed   glucose blood test strip 1 each by Other route 3 (three) times daily. Use Onetouch ultra test strips as instructed to check blood sugar 3-4 times daily.   HumaLOG KwikPen 200 UNIT/ML KwikPen Generic drug: insulin lispro Max daily 100 units   Insulin Pen Needle 32G X 4 MM Misc 1 Device by Does not apply route in the morning, at noon, in the evening, and at bedtime.   Lantus SoloStar 100 UNIT/ML Solostar Pen Generic drug: insulin glargine Inject 24 Units into the skin daily.   metoprolol tartrate 25 MG tablet Commonly known as: LOPRESSOR Take 1 tablet (25 mg total) by mouth 2 (two) times daily.   OneTouch Delica Lancets 37S Misc 1 each by Does not apply route in the morning, at noon, in the evening, and at bedtime. Use Onetouch Delica lancets to check blood sugar 3-4 times daily.   rosuvastatin 40 MG tablet Commonly known as: CRESTOR Take 1 tablet (40 mg total) by mouth daily.        OBJECTIVE:   Vital Signs: BP 126/78   Pulse 69   Ht _0  (1.803 m)   Wt 217 lb 4 oz (98.5 kg)   SpO2 96%   BMI 30.30 kg/m   Wt Readings from Last 3 Encounters:  09/10/20 217 lb 4 oz (98.5 kg)  05/14/20 217 lb  8 oz (98.7 kg)  02/06/20 205 lb (93 kg)    Exam: General: Pt appears well and is in NAD  Lungs: Clear with good BS bilat with no rales, rhonchi, or wheezes  Heart: RRR   Extremities: No pretibial edema.   Neuro: MS is good with appropriate affect, pt is alert and Ox3    DM foot exam:  02/06/2020      The skin of the feet is intact without sores or ulcerations. The  pedal pulses are 1+ on right and 1+ on left. The sensation is intact to a screening 5.07, 10 gram monofilament bilaterally    DATA REVIEWED:  Lab Results  Component Value Date   HGBA1C 8.4 (A) 09/10/2020   HGBA1C 8.9 (A) 05/14/2020   HGBA1C 12.2 (A) 02/06/2020   Lab Results  Component Value Date   MICROALBUR 2.7 (H) 10/31/2019   LDLCALC 55 02/06/2020   CREATININE 0.85 10/31/2019   Lab Results  Component Value Date   MICRALBCREAT 6.0 10/31/2019     Lab Results  Component Value Date   CHOL 111 02/06/2020   HDL 33 (L) 02/06/2020   LDLCALC 55 02/06/2020   TRIG 132 02/06/2020   CHOLHDL 3.4 02/06/2020         ASSESSMENT / PLAN / RECOMMENDATIONS:   1) Type 2 Diabetes Mellitus, Poorly controlled, With Neuropathic and Macrovascular complications - Most recent A1c of 8.4 % %. Goal A1c < 7.0 %.    - A1c continues to trend down  - He did not restart the farxiga as advised the last visit.  - Pt is a great candidate for SGLT-2 inhibitors , we discussed cardiovascular benefits as well as risk of genital infections  - Unable to tolerate Metformin XR  - He is interested in a pump , will refer to our RD for carb count training - He is going to look in to the [place] and T-slim and will let me know which one he prefers and proceed from there      MEDICATIONS: Increase  Lantus 30 units daily  Continue Humalog  16 units with each meal  Start Farxiga 5 mg , 1 tablet daily  CF: Humalog (BG-130/25)   EDUCATION / INSTRUCTIONS:  BG monitoring instructions: Patient is instructed to check his blood sugars 4 times  a day, before meals and bedtime   Call [facility] clinic if: BG persistently < 70  . I reviewed the Rule of 15 for the treatment of hypoglycemia in detail with the patient. Literature supplied.    2) Diabetic complications:   Eye: Does not have known diabetic retinopathy.   Neuro/ Feet: Does have known diabetic peripheral neuropathy .   Renal: Patient does not have known baseline CKD. He   is not on an ACEI/ARB at present.   3) Planar Fasciitis:   - Discussed NSAID's , stretching exercise and ice   F/U in 4 months    Signed electronically by: Mack Guise, MD  [facility] Endocrinology  [facility] [address] [place], [place] [address] Phone: [phone] FAX: [phone]   CC: Leonard Downing, MD [address] [place] [address] Phone: [phone]  Fax: [phone]  Return to Endocrinology clinic as below: Future Appointments  Date Time Provider [place]  09/10/2020 11:10 AM Adriana Lina, Melanie Crazier, MD [facility] [place]

## 2020-09-10 NOTE — Patient Instructions (Addendum)
-   Increase  Lantus to 30 units daily  - Continue  Humalog to 16 units with each meal  - Start Farxiga 5 mg, 1 tablet with Breakfast   - Humalog correctional insulin: ADD extra units on insulin to your meal-time Humalog dose if your blood sugars are higher than 155. Use the scale below to help guide you:   Blood sugar before meal Number of units to inject  Less than 155 0 unit  156 -  180 1 units  181 -  205 2 units  206 -  230 3 units  231 - 255 4 units  256 -  280 5 units  281 -  305 6 units  306 -  330 7 units  331 -  355 8 units  356 - 380 9 units      Insulin Pumps: Omnipod and Tandem/T-slim        HOW TO TREAT LOW BLOOD SUGARS (Blood sugar LESS THAN 70 MG/DL)  Please follow the RULE OF 15 for the treatment of hypoglycemia treatment (when your (blood sugars are less than 70 mg/dL)    STEP 1: Take 15 grams of carbohydrates when your blood sugar is low, which includes:   3-4 GLUCOSE TABS  OR  3-4 OZ OF JUICE OR REGULAR SODA OR  ONE TUBE OF GLUCOSE GEL     STEP 2: RECHECK blood sugar in 15 MINUTES STEP 3: If your blood sugar is still low at the 15 minute recheck --> then, go back to STEP 1 and treat AGAIN with another 15 grams of carbohydrates.

## 2020-09-11 ENCOUNTER — Other Ambulatory Visit: Payer: Self-pay | Admitting: Internal Medicine

## 2020-09-12 ENCOUNTER — Other Ambulatory Visit: Payer: Self-pay | Admitting: Cardiology

## 2020-09-12 DIAGNOSIS — E1169 Type 2 diabetes mellitus with other specified complication: Secondary | ICD-10-CM

## 2020-09-12 DIAGNOSIS — E785 Hyperlipidemia, unspecified: Secondary | ICD-10-CM

## 2020-09-13 DIAGNOSIS — I200 Unstable angina: Secondary | ICD-10-CM

## 2020-09-13 DIAGNOSIS — I25110 Atherosclerotic heart disease of native coronary artery with unstable angina pectoris: Secondary | ICD-10-CM

## 2020-09-23 ENCOUNTER — Telehealth: Payer: Self-pay | Admitting: Internal Medicine

## 2020-09-23 NOTE — Telephone Encounter (Signed)
No alternative. Just disregard . Continue on the rest of his diabetes medications

## 2020-09-23 NOTE — Telephone Encounter (Signed)
Patient called to ask for a substitute for [place]. Patient states " that his life is not worth $407 a month for a pill".  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

## 2020-09-23 NOTE — Telephone Encounter (Signed)
Please advise 

## 2020-09-24 NOTE — Telephone Encounter (Signed)
Spoken to patient and notified Dr Shamleffer's comments. Verbalized understanding.   

## 2020-10-14 DIAGNOSIS — I10 Essential (primary) hypertension: Secondary | ICD-10-CM | POA: Insufficient documentation

## 2020-10-14 DIAGNOSIS — G43909 Migraine, unspecified, not intractable, without status migrainosus: Secondary | ICD-10-CM | POA: Insufficient documentation

## 2020-10-14 DIAGNOSIS — M5440 Lumbago with sciatica, unspecified side: Secondary | ICD-10-CM | POA: Insufficient documentation

## 2020-10-14 DIAGNOSIS — Z8719 Personal history of other diseases of the digestive system: Secondary | ICD-10-CM | POA: Insufficient documentation

## 2020-10-14 DIAGNOSIS — E785 Hyperlipidemia, unspecified: Secondary | ICD-10-CM | POA: Insufficient documentation

## 2020-10-14 DIAGNOSIS — G8929 Other chronic pain: Secondary | ICD-10-CM | POA: Insufficient documentation

## 2020-10-14 DIAGNOSIS — G4733 Obstructive sleep apnea (adult) (pediatric): Secondary | ICD-10-CM | POA: Insufficient documentation

## 2020-10-14 DIAGNOSIS — M797 Fibromyalgia: Secondary | ICD-10-CM | POA: Insufficient documentation

## 2020-10-14 DIAGNOSIS — M545 Low back pain, unspecified: Secondary | ICD-10-CM | POA: Insufficient documentation

## 2020-10-14 DIAGNOSIS — K219 Gastro-esophageal reflux disease without esophagitis: Secondary | ICD-10-CM | POA: Insufficient documentation

## 2020-10-18 ENCOUNTER — Ambulatory Visit: Payer: Medicare Other | Admitting: Cardiology

## 2020-10-24 ENCOUNTER — Telehealth: Payer: Self-pay | Admitting: Internal Medicine

## 2020-10-24 NOTE — Telephone Encounter (Signed)
error 

## 2020-10-24 NOTE — Telephone Encounter (Signed)
Whitney with [facility] located on [address] requests to called at ph# [phone] re: clarification of quantity for insulin lispro (HUMALOG KWIKPEN) 200 UNIT/ML KwikPen  So that PHARM does not have to break the box

## 2020-10-28 ENCOUNTER — Encounter: Payer: Self-pay | Admitting: Dietician

## 2020-10-28 ENCOUNTER — Other Ambulatory Visit: Payer: Self-pay

## 2020-10-28 ENCOUNTER — Encounter: Payer: Medicare Other | Attending: Internal Medicine | Admitting: Dietician

## 2020-10-28 DIAGNOSIS — E1159 Type 2 diabetes mellitus with other circulatory complications: Secondary | ICD-10-CM | POA: Diagnosis present

## 2020-10-28 NOTE — Telephone Encounter (Signed)
Called [facility] to clarified Rx humalog-instructions last OV notes by Dr. Lonzo Cloud.

## 2020-10-28 NOTE — Patient Instructions (Addendum)
Consider making a Dexcom Clarity Account and upload your Dexcom.  Use your same user name and password as your Dexcom account.  Practice carbohydrate account. Resources for Calorie Counting include Meal plan card, Calorie Brooke Dare app, label reading  Rethink what you drink.  Drink more water rather than diet soda. Balanced meals - 1/2 your plate or more should be non starchy vegetables, 1/4 plate lean protein plus about 45 grams carbohydrates per meal  Consistent meal schedule Continue to stay active and exercise most days.  Omnipod Insulin pump option vs T-slim depending on insurance coverage

## 2020-10-28 NOTE — Progress Notes (Addendum)
Diabetes Self-Management Education  Visit Type: Follow-up  Appt. Start Time: 1110 Appt. End Time: 1200  10/28/2020  [name], identified by name and date of birth, is a 60 y.o. male with a diagnosis of Diabetes:  .   ASSESSMENT Patient is here today alone.  He was last seen by myself 03/11/2021. He states that he is feeling better and has been on 4 cruises this year.  Patient states that he is losing weight by walking and exercising more frequently.  He has been working on his own yard. His A1C is lower likely due to watching his carb intake, exercising more frequently, and taking insulin. Meal schedule is not consistent.  He cooks generally.  He now has a Dexcom G6.  Sensor reading 199 now and 177 mid visit.  He likes CGM as he can continue to monitor his sensor readings throughout the day.  CGM was unable to be downloaded at his last visit.  Discussed that he could get a Dexcom Clarity account to upload his CGM at home if he desires.  This should be set up with the same user name and password as his Dexcom account.  He is considering an insulin pump.  His sister has Type 1 Diabetes and uses the Omnipod.  He is not eligible for the Omnipod 5 (closed loop system) as this is currently only eligible to those with Type 1 diabetes.  He is eligible for the Omnipod DASH.  The T-slim could also be an option and this is a closed loop system which the patient is desiring.  History includes Type 2 diabetes(2015), [name] (no C-pap since weight loss), HTN, dyslipidemia, CAD, CABGx5 2018, stents 2005, constipation, neuropathy Medications include:  Lantus 30 units q am, Humalog sliding scale (1 units per every 25 points above 130)units before each meal (based on sliding scale), Metformin XR (not taking secondary to diarrhea). Marcelline Deist (not taking due to expense) A1C 8.4% 09/10/20 decreased from 8.9% 05/14/2020 and 12.2% 02/05/2021  Weight hx: 218 lbs 10/28/2020 He states that weight increased to 230  lbs 07/2020 and has since lost. 205 lbs 01/2020.  Patient's girlfriend lives with him.  They share shopping and cooking.   He is on disability.  Weight 218 lb (98.9 kg). Body mass index is 30.4 kg/m.   Diabetes Self-Management Education - 10/28/20 1453       Visit Information   Visit Type Follow-up      Psychosocial Assessment   Patient Belief/Attitude about Diabetes Motivated to manage diabetes      Pre-Education Assessment   Patient understands the diabetes disease and treatment process. Needs Review    Patient understands incorporating nutritional management into lifestyle. Needs Review    Patient undertands incorporating physical activity into lifestyle. Needs Review    Patient understands using medications safely. Needs Review    Patient understands monitoring blood glucose, interpreting and using results Needs Review    Patient understands prevention, detection, and treatment of acute complications. Needs Review    Patient understands prevention, detection, and treatment of chronic complications. Needs Review    Patient understands how to develop strategies to address psychosocial issues. Needs Review    Patient understands how to develop strategies to promote health/change behavior. Needs Review      Complications   Last HgB A1C per patient/outside source 8.4 %   09/2022 decreased from 8.9% 05/14/2020   How often do you check your blood sugar? > 4 times/day      Dietary Intake  Breakfast Jimmie Dean Breakfast Bowl    Lunch 15 bean soup    Dinner broccoli and rice casserole    Beverage(s) large amounts diet Pepsi, Vegetable V-8 juice, little water      Exercise   Exercise Type Light (walking / raking leaves)      Patient Education   Previous Diabetes Education Yes (please comment)   03/2020   Nutrition management  Food label reading, portion sizes and measuring food.;Carbohydrate counting;Other (comment)   meal consistency, monitoring effects on glucose   Medications  Reviewed patients medication for diabetes, action, purpose, timing of dose and side effects.    Monitoring Other (comment)   dexcom     Individualized Goals (developed by patient)   Nutrition Other (comment)   carb counting   Physical Activity Exercise 3-5 times per week    Medications take my medication as prescribed    Monitoring  test my blood glucose as discussed    Reducing Risk examine blood glucose patterns;increase portions of healthy fats      Patient Self-Evaluation of Goals - Patient rates self as meeting previously set goals (% of time)   Nutrition >75%    Physical Activity >75%    Medications >75%    Monitoring >75%    Problem Solving >75%    Reducing Risk >75%    Health Coping >75%      Post-Education Assessment   Patient understands the diabetes disease and treatment process. Demonstrates understanding / competency    Patient understands incorporating nutritional management into lifestyle. Needs Review    Patient undertands incorporating physical activity into lifestyle. Demonstrates understanding / competency    Patient understands using medications safely. Demonstrates understanding / competency    Patient understands monitoring blood glucose, interpreting and using results Demonstrates understanding / competency    Patient understands prevention, detection, and treatment of acute complications. Demonstrates understanding / competency    Patient understands prevention, detection, and treatment of chronic complications. Demonstrates understanding / competency    Patient understands how to develop strategies to address psychosocial issues. Demonstrates understanding / competency    Patient understands how to develop strategies to promote health/change behavior. Needs Review      Outcomes   Expected Outcomes Demonstrated interest in learning. Expect positive outcomes    Future DMSE 2 months    Program Status Not Completed      Subsequent Visit   Since your last visit  have you experienced any weight changes? Gain    Weight Gain (lbs) 10             Individualized Plan for Diabetes Self-Management Training:   Learning Objective:  Patient will have a greater understanding of diabetes self-management. Patient education plan is to attend individual and/or group sessions per assessed needs and concerns.   Plan:   Patient Instructions  Consider making a Dexcom Clarity Account and upload your Dexcom.  Use your same user name and password as your Dexcom account.  Practice carbohydrate account. Resources for Calorie Counting include Meal plan card, Calorie Brooke Dare app, label reading  Rethink what you drink.  Drink more water rather than diet soda. Balanced meals - 1/2 your plate or more should be non starchy vegetables, 1/4 plate lean protein plus about 45 grams carbohydrates per meal  Consistent meal schedule Continue to stay active and exercise most days.  Omnipod Insulin pump option vs T-slim depending on insurance coverage Expected Outcomes:  Demonstrated interest in learning. Expect positive outcomes  Education material provided:  ADA - How to Thrive: A Guide for Your Journey with Diabetes  If problems or questions, patient to contact team via:  Phone  Future DSME appointment: 2 months

## 2020-10-29 ENCOUNTER — Telehealth: Payer: Self-pay | Admitting: Dietician

## 2020-10-29 NOTE — Telephone Encounter (Signed)
Called patient to follow up re:  10/28/2020 nutrition appointment.  Was unable to reach patient and left a message:  The Omnipod 5 is currently not available for those with type 2 diabetes but the Omnipod DASH remains an option.  The T-slim pump is available for anyone with type 1 or type 2 diabetes and is a closed loop system.   Decision based on coverage, closed loop system, vs a tubeless system.  Patient is to call for any questions.  Oran Rein, RD, LDN, CDCES

## 2020-11-02 ENCOUNTER — Other Ambulatory Visit: Payer: Self-pay | Admitting: Cardiology

## 2020-11-02 DIAGNOSIS — E785 Hyperlipidemia, unspecified: Secondary | ICD-10-CM

## 2020-11-02 DIAGNOSIS — E1169 Type 2 diabetes mellitus with other specified complication: Secondary | ICD-10-CM

## 2020-11-09 ENCOUNTER — Other Ambulatory Visit: Payer: Self-pay | Admitting: Internal Medicine

## 2020-11-28 ENCOUNTER — Encounter: Payer: Self-pay | Admitting: Cardiology

## 2020-11-28 ENCOUNTER — Ambulatory Visit: Payer: Medicare Other | Admitting: Cardiology

## 2020-11-28 ENCOUNTER — Other Ambulatory Visit: Payer: Self-pay

## 2020-11-28 VITALS — BP 122/76 | HR 71 | Ht 71.0 in | Wt 219.0 lb

## 2020-11-28 DIAGNOSIS — I10 Essential (primary) hypertension: Secondary | ICD-10-CM | POA: Diagnosis not present

## 2020-11-28 DIAGNOSIS — E1140 Type 2 diabetes mellitus with diabetic neuropathy, unspecified: Secondary | ICD-10-CM

## 2020-11-28 DIAGNOSIS — E785 Hyperlipidemia, unspecified: Secondary | ICD-10-CM

## 2020-11-28 DIAGNOSIS — IMO0002 Reserved for concepts with insufficient information to code with codable children: Secondary | ICD-10-CM

## 2020-11-28 DIAGNOSIS — I2510 Atherosclerotic heart disease of native coronary artery without angina pectoris: Secondary | ICD-10-CM

## 2020-11-28 DIAGNOSIS — E1165 Type 2 diabetes mellitus with hyperglycemia: Secondary | ICD-10-CM

## 2020-11-28 NOTE — Patient Instructions (Signed)
Medication Instructions:  Your physician recommends that you continue on your current medications as directed. Please refer to the Current Medication list given to you today.  *If you need a refill on your cardiac medications before your next appointment, please call your pharmacy*   Lab Work: Your physician recommends that you return for lab work in: TODAY CMP, Lipids, Lpa If you have labs (blood work) drawn today and your tests are completely normal, you will receive your results only by: MyChart Message (if you have MyChart) OR A paper copy in the mail If you have any lab test that is abnormal or we need to change your treatment, we will call you to review the results.   Testing/Procedures: None   Follow-Up: At [facility], you and your health needs are our priority.  As part of our continuing mission to provide you with exceptional heart care, we have created designated Provider Care Teams.  These Care Teams include your primary Cardiologist (physician) and Advanced Practice Providers (APPs -  Physician Assistants and Nurse Practitioners) who all work together to provide you with the care you need, when you need it.  We recommend signing up for the patient portal called "MyChart".  Sign up information is provided on this After Visit Summary.  MyChart is used to connect with patients for Virtual Visits (Telemedicine).  Patients are able to view lab/test results, encounter notes, upcoming appointments, etc.  Non-urgent messages can be sent to your provider as well.   To learn more about what you can do with MyChart, go to [email or web address].    Your next appointment:   1 year(s)  The format for your next appointment:   In Person  Provider:   Brian Munley, MD   Other Instructions   

## 2020-11-28 NOTE — Progress Notes (Signed)
Cardiology Office Note:    Date:  11/28/2020   ID:  [name] [name], DOB [birth year], MRN [id number]  PCP:  Kaleen Mask, MD  Cardiologist:  Norman Herrlich, MD    Referring MD: Kaleen Mask, *    ASSESSMENT:    1. Coronary artery disease involving native coronary artery of native heart without angina pectoris   2. Dyslipidemia   3. DM type 2, uncontrolled, with neuropathy (HCC)   4. Primary hypertension    PLAN:    In order of problems listed above:  He continues to do well following surgical revascularization which was the appropriate intervention a diabetic with multivessel CAD.  He is New York Heart Association class I having no angina current medical therapy we will continue aspirin his high intensity statin beta-blocker and is on appropriate diabetic therapy and is approaching target for A1c.  His sensation left chest is typical of stripping the left thoracic artery I told him if we had doubts we could do testing like a perfusion study and he is comfortable with my answer. Continue his statin I like to check an LP(a) level I do it once in life and if it was significantly elevated I would use combined therapy with a PCSK9 inhibitor Doing extremely well managed by endocrinology I am convinced to achieve target At target continue current treatment   Next appointment: 1 year or sooner I encouraged him to sign up my chart and communicate to me if he has questions   Medication Adjustments/Labs and Tests Ordered: Current medicines are reviewed at length with the patient today.  Concerns regarding medicines are outlined above.  Orders Placed This Encounter  Procedures   Comprehensive metabolic panel   Lipid panel   Lipoprotein A (LPA)   EKG 12-Lead   No orders of the defined types were placed in this encounter.   Chief Complaint  Patient presents with   Follow-up   Coronary Artery Disease    History of Present Illness:    JOHNTA COUTS is a 60 y.o.  male with a hx of coronary artery disease with remote PCI and stent 2005 and subsequently presenting in July 2018 with ACS coronary angiography showed severe three-vessel CAD minimal carotid atherosclerosis 1 to 39% bilaterally and he underwent CABG with a left thoracic artery anastomosed to the LAD vein graft 1 to the ramus vein graft to the obtuse marginal and vein graft 3 sequentially to the PDA and posterior lateral branch.  Last seen 09/06/2019 by Dr. Jens Som.  His last lipid profile showed an ideal LDL of 55.  Other medical problems include hypertension hyperlipidemia and diabetes requiring insulin which is uncontrolled at his last visit and referred to endocrinology.  Most recent A1c 09/10/2020 13.9%  Compliance with diet, lifestyle and medications: Yes  He really is highly motivated has made an incredible impact his last A1c is in the range of 8 and he is using a continuous monitor and is considering an insulin pump. Vigorous active no angina dyspnea edema palpitation or syncope. He tolerates his statin without muscle pain or weakness Since surgery he has had a peculiar sensation dysesthesia in the left chest very characteristic of left thoracic artery stripping.  It is not anginal in nature not exertional and not relieved with rest it is an electric feeling. Past Medical History:  Diagnosis Date   Anxiety    Chronic lower back pain    Coronary artery disease 03/2004    3 stents put in  Depression    Fibromyalgia    GERD (gastroesophageal reflux disease)    History of hiatal hernia    Hyperlipidemia LDL goal <70    Hypertension    Migraine    "weekly; back in the 1990s" (11/12/2016)   Myocardial infarction (HCC) 2005   [name] (obstructive sleep apnea)    "quit wearing my mask" (11/12/2016)   Type II diabetes mellitus (HCC) 2010    Past Surgical History:  Procedure Laterality Date   CARDIAC CATHETERIZATION  11/12/2016   CORONARY ANGIOPLASTY WITH STENT PLACEMENT  03/2004    x 3  stents   CORONARY ARTERY BYPASS GRAFT N/A 11/17/2016   Procedure: CORONARY ARTERY BYPASS GRAFTING (CABG) x 5, USING RIGHT INTERNAL MAMMARY ARTERY AND RIGHT GREATER SAPHENOUS VEIN HARVESTED ENDOSCOPICALLY;  Surgeon: Kerin Perna, MD;  Location: [facility] OR;  Service: Open Heart Surgery;  Laterality: N/A;   INGUINAL HERNIA REPAIR Right 2000   LEFT HEART CATH AND CORONARY ANGIOGRAPHY N/A 11/12/2016   Procedure: Left Heart Cath and Coronary Angiography;  Surgeon: Runell Gess, MD;  Location: [facility] INVASIVE CV LAB;  Service: Cardiovascular;  Laterality: N/A;   TEE WITHOUT CARDIOVERSION N/A 11/17/2016   Procedure: TRANSESOPHAGEAL ECHOCARDIOGRAM (TEE);  Surgeon: Donata Clay, Theron Arista, MD;  Location: [facility] OR;  Service: Open Heart Surgery;  Laterality: N/A;    Current Medications: Current Meds  Medication Sig   aspirin EC 81 MG tablet Take 81 mg by mouth daily. Reported on 08/06/2015   Continuous Blood Gluc Transmit (DEXCOM G6 TRANSMITTER) MISC Change every 10 days as needed/directed   insulin glargine (LANTUS SOLOSTAR) 100 UNIT/ML Solostar Pen Inject 30 Units into the skin daily.   insulin lispro (HUMALOG KWIKPEN) 200 UNIT/ML KwikPen Max 100 units daily. Using sliding scale   Insulin Pen Needle 32G X 4 MM MISC 1 Device by Does not apply route in the morning, at noon, in the evening, and at bedtime.   metoprolol tartrate (LOPRESSOR) 25 MG tablet Take 1 tablet (25 mg total) by mouth 2 (two) times daily.   OneTouch Delica Lancets 30G MISC 1 each by Does not apply route in the morning, at noon, in the evening, and at bedtime. Use Onetouch Delica lancets to check blood sugar 3-4 times daily.   ONETOUCH ULTRA test strip 1 EACH BY OTHER ROUTE 3 (THREE) TIMES DAILY. USE ONETOUCH ULTRA TEST STRIPS AS INSTRUCTED TO CHECK BLOOD SUGAR 3-4 TIMES DAILY.   rosuvastatin (CRESTOR) 40 MG tablet TAKE 1 TABLET BY MOUTH EVERY DAY     Allergies:   No known allergies   Social History   Socioeconomic History   Marital status:  Divorced    Spouse name: Not on file   Number of children: 1    Years of education: 12    Highest education level: Not on file  Occupational History   Occupation: Unemployed   Tobacco Use   Smoking status: Never   Smokeless tobacco: Never  Vaping Use   Vaping Use: Never used  Substance and Sexual Activity   Alcohol use: Yes    Alcohol/week: 6.0 standard drinks    Types: 6 Cans of beer per week   Drug use: No   Sexual activity: Never  Other Topics Concern   Not on file  Social History Narrative   Lives alone.    Daughter lives nearby,5 miles away   Sister next door.    1 grandchild, 61 yo granddaughter    Social Determinants of Corporate investment banker Strain: Not  on file  Food Insecurity: Not on file  Transportation Needs: Not on file  Physical Activity: Not on file  Stress: Not on file  Social Connections: Not on file     Family History: The patient's family history includes Diabetes in his father, paternal grandfather, and sister. There is no history of Cancer. ROS:   Please see the history of present illness.    All other systems reviewed and are negative.  EKGs/Labs/Other Studies Reviewed:    The following studies were reviewed today:  EKG:  EKG ordered today and personally reviewed.  The ekg ordered today demonstrates sinus rhythm old inferior lateral MI unchanged 1 year ago  Recent Labs: 02/06/2020: ALT 21  Recent Lipid Panel    Component Value Date/Time   CHOL 111 02/06/2020 1201   TRIG 132 02/06/2020 1201   HDL 33 (L) 02/06/2020 1201   CHOLHDL 3.4 02/06/2020 1201   CHOLHDL 4.3 11/13/2016 0359   VLDL 49 (H) 11/13/2016 0359   LDLCALC 55 02/06/2020 1201    Physical Exam:    VS:  BP 122/76 (BP Location: Left Arm, Patient Position: Sitting, Cuff Size: Normal)   Pulse 71   Ht 5\' 11"  (1.803 m)   Wt 219 lb (99.3 kg)   SpO2 97%   BMI 30.54 kg/m     Wt Readings from Last 3 Encounters:  11/28/20 219 lb (99.3 kg)  10/28/20 218 lb (98.9 kg)   09/10/20 217 lb 4 oz (98.5 kg)     GEN: He does not look chronically ill he has no xanthoma or xanthelasma well nourished, well developed in no acute distress HEENT: Normal NECK: No JVD; No carotid bruits LYMPHATICS: No lymphadenopathy CARDIAC: RRR, no murmurs, rubs, gallops RESPIRATORY:  Clear to auscultation without rales, wheezing or rhonchi  ABDOMEN: Soft, non-tender, non-distended MUSCULOSKELETAL:  No edema; No deformity  SKIN: Warm and dry NEUROLOGIC:  Alert and oriented x 3 PSYCHIATRIC:  Normal affect    Signed, 11/10/20, MD  11/28/2020 3:21 PM    [facility]

## 2020-11-29 ENCOUNTER — Telehealth: Payer: Self-pay

## 2020-11-29 LAB — COMPREHENSIVE METABOLIC PANEL
ALT: 33 IU/L (ref 0–44)
AST: 34 IU/L (ref 0–40)
Albumin/Globulin Ratio: 2.1 (ref 1.2–2.2)
Albumin: 4.8 g/dL (ref 3.8–4.9)
Alkaline Phosphatase: 85 IU/L (ref 44–121)
BUN/Creatinine Ratio: 21 (ref 10–24)
BUN: 21 mg/dL (ref 8–27)
Bilirubin Total: 0.6 mg/dL (ref 0.0–1.2)
CO2: 20 mmol/L (ref 20–29)
Calcium: 10 mg/dL (ref 8.6–10.2)
Chloride: 109 mmol/L — ABNORMAL HIGH (ref 96–106)
Creatinine, Ser: 1.01 mg/dL (ref 0.76–1.27)
Globulin, Total: 2.3 g/dL (ref 1.5–4.5)
Glucose: 73 mg/dL (ref 65–99)
Potassium: 4.5 mmol/L (ref 3.5–5.2)
Sodium: 146 mmol/L — ABNORMAL HIGH (ref 134–144)
Total Protein: 7.1 g/dL (ref 6.0–8.5)
eGFR: 85 mL/min/{1.73_m2} (ref >59)

## 2020-11-29 LAB — LIPID PANEL
Chol/HDL Ratio: 4.1 ratio (ref 0.0–5.0)
Cholesterol, Total: 123 mg/dL (ref 100–199)
HDL: 30 mg/dL — ABNORMAL LOW (ref >39)
LDL Chol Calc (NIH): 53 mg/dL (ref 0–99)
Triglycerides: 252 mg/dL — ABNORMAL HIGH (ref 0–149)
VLDL Cholesterol Cal: 40 mg/dL (ref 5–40)

## 2020-11-29 LAB — LIPOPROTEIN A (LPA): Lipoprotein (a): 10 nmol/L (ref <75.0)

## 2020-11-29 NOTE — Telephone Encounter (Signed)
-----   Message from Brian J Munley, MD sent at 11/29/2020 12:40 PM EDT ----- Normal or stable result  Good result triglycerides are in range for nonfasting specimen 

## 2020-11-29 NOTE — Telephone Encounter (Signed)
Left message on patients voicemail to please return our call.   

## 2020-12-02 ENCOUNTER — Telehealth: Payer: Self-pay

## 2020-12-02 NOTE — Telephone Encounter (Signed)
-----   Message from Baldo Daub, MD sent at 11/29/2020 12:40 PM EDT ----- Normal or stable result  Good result triglycerides are in range for nonfasting specimen

## 2020-12-02 NOTE — Telephone Encounter (Signed)
Left message on patients voicemail to please return our call.   

## 2020-12-03 ENCOUNTER — Other Ambulatory Visit: Payer: Self-pay | Admitting: Cardiology

## 2020-12-03 DIAGNOSIS — E1169 Type 2 diabetes mellitus with other specified complication: Secondary | ICD-10-CM

## 2020-12-03 DIAGNOSIS — E785 Hyperlipidemia, unspecified: Secondary | ICD-10-CM

## 2020-12-20 ENCOUNTER — Other Ambulatory Visit: Payer: Self-pay | Admitting: Cardiology

## 2020-12-20 DIAGNOSIS — E785 Hyperlipidemia, unspecified: Secondary | ICD-10-CM

## 2020-12-20 DIAGNOSIS — E1169 Type 2 diabetes mellitus with other specified complication: Secondary | ICD-10-CM

## 2020-12-30 ENCOUNTER — Other Ambulatory Visit: Payer: Self-pay | Admitting: Internal Medicine

## 2020-12-30 ENCOUNTER — Encounter: Payer: Medicare Other | Attending: Internal Medicine | Admitting: Dietician

## 2020-12-30 ENCOUNTER — Other Ambulatory Visit: Payer: Self-pay

## 2020-12-30 ENCOUNTER — Encounter: Payer: Self-pay | Admitting: Dietician

## 2020-12-30 DIAGNOSIS — E1165 Type 2 diabetes mellitus with hyperglycemia: Secondary | ICD-10-CM | POA: Diagnosis present

## 2020-12-30 DIAGNOSIS — IMO0002 Reserved for concepts with insufficient information to code with codable children: Secondary | ICD-10-CM

## 2020-12-30 DIAGNOSIS — E1140 Type 2 diabetes mellitus with diabetic neuropathy, unspecified: Secondary | ICD-10-CM | POA: Diagnosis not present

## 2020-12-30 MED ORDER — OMNIPOD 5 DEXG7G6 INTRO GEN 5 KIT: 1.0000 | PACK | 0 refills | Status: DC

## 2020-12-30 MED ORDER — OMNIPOD 5 DEXG7G6 PODS GEN 5 MISC: 1.0000 | 3 refills | Status: DC

## 2020-12-30 NOTE — Patient Instructions (Addendum)
Continue to stay active.   Aim to have vegetables and fruit daily. Continue to practice carbohydrate counting.  You will need to purchase a phone that is compatible with the Dexcom if you get the Omnipod 5. When you get a new phone, put the Dexcom and Dexcom Clarity app on it. Please call if you need help starting the Dexcom on your phone, please call. Please call when you get the Omnipod.

## 2020-12-30 NOTE — Progress Notes (Signed)
Diabetes Self-Management Education  Visit Type: Follow-up  Appt. Start Time: 1110 Appt. End Time: 1145  12/30/2020  [name], identified by name and date of birth, is a 60 y.o. male with a diagnosis of Diabetes:  .   ASSESSMENT Patient is here today alone.   He states that he is ready for the insulin pump and reviewed this with him.  He would like the Omnipod 5 if possible.  Will message MD. Discussed that he will need a phone that is compatible with the Dexcom if he does get this pump.  He states that he struggles with exercise if it does not have a purpose.  He does his own yard work 3 days per week. They are going to learn to play pickle ball.  He is continuing to work on Designer, television/film set.   Vegetable and fruit intake are low although he states that he likes them. He has been reading labels more and is becoming more aware of carb counting.  Reviewed this further today.    History includes Type 2 diabetes(2015), [name] (no C-pap since weight loss), HTN, dyslipidemia, CAD, CABGx5 2018, stents 2005, constipation, neuropathy Medications include:  Lantus 30 units q am, Humalog sliding scale (1 units per every 25 points above 130)units before each meal (based on sliding scale)- plus more for food and reports taking about 20 units total before meals, Metformin XR (not taking secondary to diarrhea). Marcelline Deist (not taking due to expense) A1C 8.4% 09/10/20 decreased from 8.9% 05/14/2020 and 12.2% 02/05/2021   Weight hx: 221 lbs 12/30/2020 218 lbs 10/28/2020 He states that weight increased to 230 lbs 07/2020 and has since lost. 205 lbs 01/2020.   Patient's girlfriend lives with him.  They share shopping and cooking.   He is on disability.  Weight 221 lb (100.2 kg). Body mass index is 30.82 kg/m.   Diabetes Self-Management Education - 12/30/20 1354       Visit Information   Visit Type Follow-up      Pre-Education Assessment   Patient understands the diabetes disease and treatment  process. Demonstrates understanding / competency    Patient understands incorporating nutritional management into lifestyle. Needs Review    Patient undertands incorporating physical activity into lifestyle. Demonstrates understanding / competency    Patient understands using medications safely. Demonstrates understanding / competency    Patient understands monitoring blood glucose, interpreting and using results Demonstrates understanding / competency    Patient understands prevention, detection, and treatment of acute complications. Demonstrates understanding / competency    Patient understands prevention, detection, and treatment of chronic complications. Demonstrates understanding / competency    Patient understands how to develop strategies to address psychosocial issues. Demonstrates understanding / competency    Patient understands how to develop strategies to promote health/change behavior. Needs Review      Complications   How often do you check your blood sugar? > 4 times/day    Fasting Blood glucose range (mg/dL) >841    Postprandial Blood glucose range (mg/dL) 32-440;102-725      Dietary Intake   Breakfast Jimmie Dean Breakfast Bowl    Lunch PB sandwich, banana    Dinner Mushroom, swiss hamburger on a bun, 2 cups tator tots    Beverage(s) large amounts diet Pepsi, vegetable v-8, little water, beer or other alcohol at times      Exercise   Exercise Type Light (walking / raking leaves)    How many days per week to you exercise? 3  How many minutes per day do you exercise? 60    Total minutes per week of exercise 180      Patient Education   Previous Diabetes Education Yes (please comment)   2 months   Nutrition management  Other (comment);Meal options for control of blood glucose level and chronic complications.   carb count review   Monitoring Taught/discussed recording of test results and interpretation of SMBG.      Individualized Goals (developed by patient)    Nutrition General guidelines for healthy choices and portions discussed    Physical Activity Exercise 5-7 days per week;30 minutes per day    Medications take my medication as prescribed    Monitoring  test my blood glucose as discussed    Reducing Risk examine blood glucose patterns    Health Coping discuss diabetes with (comment)      Patient Self-Evaluation of Goals - Patient rates self as meeting previously set goals (% of time)   Nutrition 50 - 75 %    Physical Activity >75%    Medications >75%    Monitoring >75%    Problem Solving >75%    Reducing Risk >75%    Health Coping >75%      Post-Education Assessment   Patient understands the diabetes disease and treatment process. Demonstrates understanding / competency    Patient understands incorporating nutritional management into lifestyle. Needs Review    Patient undertands incorporating physical activity into lifestyle. Demonstrates understanding / competency    Patient understands using medications safely. Demonstrates understanding / competency    Patient understands monitoring blood glucose, interpreting and using results Demonstrates understanding / competency    Patient understands prevention, detection, and treatment of acute complications. Demonstrates understanding / competency    Patient understands prevention, detection, and treatment of chronic complications. Demonstrates understanding / competency    Patient understands how to develop strategies to address psychosocial issues. Demonstrates understanding / competency    Patient understands how to develop strategies to promote health/change behavior. Needs Review      Outcomes   Expected Outcomes Demonstrated interest in learning. Expect positive outcomes    Future DMSE 2 months    Program Status Not Completed      Subsequent Visit   Since your last visit have you continued or begun to take your medications as prescribed? Yes    Since your last visit have you  experienced any weight changes? Gain    Weight Gain (lbs) 3    Since your last visit, are you checking your blood glucose at least once a day? Yes             Individualized Plan for Diabetes Self-Management Training:   Learning Objective:  Patient will have a greater understanding of diabetes self-management. Patient education plan is to attend individual and/or group sessions per assessed needs and concerns.   Plan:   Patient Instructions  Continue to stay active.   Aim to have vegetables and fruit daily. Continue to practice carbohydrate counting.  You will need to purchase a phone that is compatible with the Dexcom if you get the Omnipod 5. When you get a new phone, put the Dexcom and Dexcom Clarity app on it. Please call if you need help starting the Dexcom on your phone, please call. Please call when you get the Omnipod.    Expected Outcomes:  Demonstrated interest in learning. Expect positive outcomes  Education material provided:   If problems or questions, patient to contact team via:  Phone  Future DSME appointment: 2 months

## 2021-01-14 ENCOUNTER — Ambulatory Visit: Payer: Medicare Other | Admitting: Internal Medicine

## 2021-01-14 ENCOUNTER — Other Ambulatory Visit: Payer: Self-pay

## 2021-01-14 DIAGNOSIS — E785 Hyperlipidemia, unspecified: Secondary | ICD-10-CM

## 2021-01-14 DIAGNOSIS — E1169 Type 2 diabetes mellitus with other specified complication: Secondary | ICD-10-CM

## 2021-01-14 MED ORDER — ROSUVASTATIN CALCIUM 40 MG PO TABS
40.0000 mg | ORAL_TABLET | Freq: Every day | ORAL | 3 refills | Status: DC
Start: 2021-01-14 — End: 2022-01-19

## 2021-01-14 NOTE — Telephone Encounter (Signed)
Rosuvastatin 40 mg # 90 x 3 refills sent to [facility] [place], [place]

## 2021-03-24 ENCOUNTER — Encounter: Payer: Self-pay | Admitting: Dietician

## 2021-03-24 ENCOUNTER — Other Ambulatory Visit: Payer: Self-pay

## 2021-03-24 ENCOUNTER — Other Ambulatory Visit: Payer: Self-pay | Admitting: Internal Medicine

## 2021-03-24 ENCOUNTER — Encounter: Payer: Medicare Other | Attending: Internal Medicine | Admitting: Dietician

## 2021-03-24 DIAGNOSIS — E1165 Type 2 diabetes mellitus with hyperglycemia: Secondary | ICD-10-CM | POA: Diagnosis not present

## 2021-03-24 MED ORDER — OMNIPOD 5 DEXG7G6 PODS GEN 5 MISC: 1.0000 | 3 refills | Status: DC

## 2021-03-24 MED ORDER — OMNIPOD 5 DEXG7G6 INTRO GEN 5 KIT: 1.0000 | PACK | 0 refills | Status: DC

## 2021-03-24 NOTE — Progress Notes (Signed)
Diabetes Self-Management Education  Visit Type: Follow-up  Appt. Start Time: 1410 Appt. End Time: 1300  03/24/2021  [name], identified by name and date of birth, is a 60 y.o. male with a diagnosis of Diabetes:  .   ASSESSMENT Patient is here today alone. -He has not heard about the OmniPod Insulin Pump yet. Called [place], the [employer] Rep, who recommended that his [facility]  Messaged MD about this who has sent to his pharmacy.  Patient has sales rep number if needed. He is using the receiver for his Dexcom and have instructed him to put the G6 app on his phone and use this next time he starts a sensor.  -Patient states that he is concerned that he takes the insulin and it does not appear to be working. Discussed to be sure to rotate the insulin injection sites.  -Weight is stable.  -He now has an iPhone and  to track steps but lacks motivation when home but does walk increased amounts when he is on a cruise.  -He states that he has been practicing carbohydrate counting. Showed him the Calorie Edison Pace app and suggested putting this on his phone.  History includes Type 2 diabetes(2015), [name] (no C-pap since weight loss), HTN, dyslipidemia, CAD, CABGx5 2018, stents 2005, constipation, neuropathy Medications include:  Lantus 30 units q am, Humalog sliding scale (1 units per every 25 points above 130)units before each meal (based on sliding scale)- plus more for food and reports taking about 20 units total before meals, Metformin XR (not taking secondary to diarrhea). Wilder Glade (not taking due to expense) A1C 8.4% 09/10/20 decreased from 8.9% 05/14/2020 and 12.2% 02/05/2021   Weight hx: 220 lbs 03/24/2021 221 lbs 12/30/2020 218 lbs 10/28/2020 He states that weight increased to 230 lbs 07/2020 and has since lost. 205 lbs 01/2020.   Patient's girlfriend lives with him.  They share shopping and cooking.  Girlfriend is frequently gone caring for her  father. He is on disability.  Struggles with motivation and self care when girlfriend is not there.  Weight 220 lb (99.8 kg). Body mass index is 30.68 kg/m.   Diabetes Self-Management Education - 03/24/21 1600       Visit Information   Visit Type Follow-up      Pre-Education Assessment   Patient understands the diabetes disease and treatment process. Demonstrates understanding / competency    Patient understands incorporating nutritional management into lifestyle. Needs Review    Patient undertands incorporating physical activity into lifestyle. Needs Review    Patient understands using medications safely. Needs Review    Patient understands monitoring blood glucose, interpreting and using results Demonstrates understanding / competency    Patient understands prevention, detection, and treatment of acute complications. Demonstrates understanding / competency    Patient understands prevention, detection, and treatment of chronic complications. Demonstrates understanding / competency    Patient understands how to develop strategies to address psychosocial issues. Demonstrates understanding / competency    Patient understands how to develop strategies to promote health/change behavior. Demonstrates understanding / competency      Complications   How often do you check your blood sugar? > 4 times/day    Fasting Blood glucose range (mg/dL) 130-179    Postprandial Blood glucose range (mg/dL) >200    Number of hyperglycemic episodes per week 0      Dietary Intake   Breakfast Jimmie Dean Breakfast bowl    Lunch PB sandwich or soup "if i  remember"    Dinner [employer] plain roast beef sandwich OR broccoli casserole    Beverage(s) large amounts of diet Pepsi, little water, vegetable v-8, beer or other alcohol occasionally      Exercise   Exercise Type ADL's      Patient Education   Previous Diabetes Education Yes (please comment)   12/2020 with this RD   Nutrition management  Role of diet  in the treatment of diabetes and the relationship between the three main macronutrients and blood glucose level;Carbohydrate counting    Medications Reviewed patients medication for diabetes, action, purpose, timing of dose and side effects.   insulin pump problem solving to obtain     Individualized Goals (developed by patient)   Nutrition Other (comment)   count carbohydrates, avoid skipping meals   Physical Activity Exercise 3-5 times per week;30 minutes per day    Medications take my medication as prescribed    Monitoring  test my blood glucose as discussed    Reducing Risk examine blood glucose patterns;increase portions of healthy fats    Health Coping discuss diabetes with (comment)   MD, RD, CDCES     Patient Self-Evaluation of Goals - Patient rates self as meeting previously set goals (% of time)   Nutrition 50 - 75 %    Physical Activity >75%    Medications >75%    Monitoring >75%    Problem Solving >75%    Reducing Risk >75%    Health Coping >75%      Post-Education Assessment   Patient understands the diabetes disease and treatment process. Demonstrates understanding / competency    Patient understands incorporating nutritional management into lifestyle. Demonstrates understanding / competency    Patient undertands incorporating physical activity into lifestyle. Demonstrates understanding / competency    Patient understands using medications safely. Demonstrates understanding / competency    Patient understands monitoring blood glucose, interpreting and using results Demonstrates understanding / competency    Patient understands prevention, detection, and treatment of acute complications. Demonstrates understanding / competency    Patient understands prevention, detection, and treatment of chronic complications. Demonstrates understanding / competency    Patient understands how to develop strategies to address psychosocial issues. Demonstrates understanding / competency     Patient understands how to develop strategies to promote health/change behavior. Demonstrates understanding / competency      Outcomes   Expected Outcomes Demonstrated interest in learning. Expect positive outcomes    Future DMSE PRN    Program Status Completed      Subsequent Visit   Since your last visit have you continued or begun to take your medications as prescribed? Yes    Since your last visit have you experienced any weight changes? Loss    Weight Loss (lbs) 1    Since your last visit, are you checking your blood glucose at least once a day? Yes             Individualized Plan for Diabetes Self-Management Training:   Learning Objective:  Patient will have a greater understanding of diabetes self-management. Patient education plan is to attend individual and/or group sessions per assessed needs and concerns.   Plan:   Patient Instructions  You will need the following apps on your phone:  [place] Follow (if you wish for someone to see your dexcom reading all the time - they would also need this app on their phone).  Consider going to a gym. What motivates you to  exercise:  lower blood sugar, better sleep, stress control, better mood, competition with yourself?  Aim to have vegetables daily and fresh fruit. Continue to practice carbohydrate counting.  Calorie Brooke Dare app Continue to practice carb counting   Call for an appointment when you get the Omnipod.    Expected Outcomes:  Demonstrated interest in learning. Expect positive outcomes  Education material provided:   If problems or questions, patient to contact team via:  Phone  Future DSME appointment: PRN

## 2021-03-24 NOTE — Patient Instructions (Addendum)
You will need the following apps on your phone:  Dexcom G6  Dexcom Clarity  Dexcom Follow (if you wish for someone to see your dexcom reading all the time - they would also need this app on their phone).  Consider going to a gym. What motivates you to exercise:  lower blood sugar, better sleep, stress control, better mood, competition with yourself?  Aim to have vegetables daily and fresh fruit. Continue to practice carbohydrate counting.  Calorie Brooke Dare app Continue to practice carb counting   Call for an appointment when you get the Omnipod.

## 2021-03-25 ENCOUNTER — Encounter: Payer: Self-pay | Admitting: Internal Medicine

## 2021-03-25 ENCOUNTER — Ambulatory Visit (INDEPENDENT_AMBULATORY_CARE_PROVIDER_SITE_OTHER): Payer: Medicare Other | Admitting: Internal Medicine

## 2021-03-25 VITALS — BP 130/72 | HR 85 | Ht 71.0 in | Wt 222.4 lb

## 2021-03-25 DIAGNOSIS — E1159 Type 2 diabetes mellitus with other circulatory complications: Secondary | ICD-10-CM

## 2021-03-25 DIAGNOSIS — E1142 Type 2 diabetes mellitus with diabetic polyneuropathy: Secondary | ICD-10-CM

## 2021-03-25 DIAGNOSIS — E785 Hyperlipidemia, unspecified: Secondary | ICD-10-CM

## 2021-03-25 DIAGNOSIS — E1165 Type 2 diabetes mellitus with hyperglycemia: Secondary | ICD-10-CM | POA: Diagnosis not present

## 2021-03-25 LAB — POCT GLYCOSYLATED HEMOGLOBIN (HGB A1C): Hemoglobin A1C: 9.6 % — AB (ref 4.0–5.6)

## 2021-03-25 MED ORDER — TRULICITY 0.75 MG/0.5ML ~~LOC~~ SOAJ: 0.7500 mg | SUBCUTANEOUS | 6 refills | Status: DC

## 2021-03-25 MED ORDER — LANTUS SOLOSTAR 100 UNIT/ML ~~LOC~~ SOPN: 30.0000 [IU] | PEN_INJECTOR | Freq: Every day | SUBCUTANEOUS | 6 refills | Status: DC

## 2021-03-25 MED ORDER — HUMALOG KWIKPEN 200 UNIT/ML ~~LOC~~ SOPN: PEN_INJECTOR | SUBCUTANEOUS | 3 refills | Status: DC

## 2021-03-25 MED ORDER — INSULIN PEN NEEDLE 29G X 5MM MISC: 1.0000 | Freq: Four times a day (QID) | 3 refills | Status: AC

## 2021-03-25 NOTE — Progress Notes (Signed)
Name: [name] [name]  Age/ Sex: 60 y.o., male   MRN/ DOB: [id number], [birth year]     PCP: Leonard Downing, MD   Reason for Endocrinology Evaluation: Type 2 Diabetes Mellitus  Initial Endocrine Consultative Visit: 10/31/2019    PATIENT IDENTIFIER: [name] is a 60 y.o. male with a past medical history of T2Dm, [name], HTN, Dyslipidemia and CAD (S/P stents 2005 and CABG 2018). The patient has followed with Endocrinology clinic since 10/31/2019 for consultative assistance with management of his diabetes.  DIABETIC HISTORY:  [name] [name] was diagnosed with DM in 2015. Metformin caused massive diarrhea . His hemoglobin A1c has ranged from 8.9% in 2016, peaking at 13.7% in 2015.  On disability for heart and back issues    On his initial visit to our clinic he had an A1c 13.9 % . He was not on any medications by that time, we started MDI regimen.    Attempted to restart metformin XR but stopped by 05/2020 due to diarrhea   GAD -65 and Iselt cell antibody levels were undetectable.   Sister with T1DM   SUBJECTIVE:   During the last visit (09/10/2020): A1c 8.4 % , We started Farxiga, increased metformin and adjusted MDi regimen.    Today (03/25/2021): [name] [name] is here for a follow up on diabetes management.  He checks his blood sugars multiple times a day through Dexcom. The patient has not had hypoglycemic episodes since the last clinic visit.   He is still waiting  on the Omnipod   Denies nausea or diarrhea Has sharp pains , gabapentin has been helping with neuropathy    HOME DIABETES REGIMEN:  Lantus 30 units daily  Humalog 16 units with each meal - takes 20 units  CF : Humalog (BG -130/25)      Statin: Yes ACE-I/ARB: was prescribed but not taken     CONTINUOUS GLUCOSE MONITORING RECORD INTERPRETATION :     Dates of Recording: 11/2-11/15/2022  Sensor description:dexcom   Results statistics:   CGM use % of time 93  Average and SD 278/70  Time in  range    10    %  % Time Above 180 23  % Time above 250 67  % Time Below target 0   Glycemic patterns summary: Hyperglycemia noted during the day and night  Hyperglycemic episodes   all day and night   Hypoglycemic episodes occurred n/a  Overnight periods: high       DIABETIC COMPLICATIONS: Microvascular complications:  Mild retinopathy , neuropathy  Denies: CKD Last eye exam: Completed 08/2020      Macrovascular complications:  CAD (S/p CABG 2018) Denies: PVD, CVA    HISTORY:  Past Medical History:  Past Medical History:  Diagnosis Date   Anxiety    Chronic lower back pain    Coronary artery disease 03/2004    3 stents put in    Depression    Fibromyalgia    GERD (gastroesophageal reflux disease)    History of hiatal hernia    Hyperlipidemia LDL goal <70    Hypertension    Migraine    "weekly; back in the 1990s" (11/12/2016)   Myocardial infarction ([place]) 2005   [name] (obstructive sleep apnea)    "quit wearing my mask" (11/12/2016)   Type II diabetes mellitus ([place]) 2010   Past Surgical History:  Past Surgical History:  Procedure Laterality Date   CARDIAC CATHETERIZATION  11/12/2016   CORONARY ANGIOPLASTY WITH STENT PLACEMENT  03/2004  x 3 stents   CORONARY ARTERY BYPASS GRAFT N/A 11/17/2016   Procedure: CORONARY ARTERY BYPASS GRAFTING (CABG) x 5, USING RIGHT INTERNAL MAMMARY ARTERY AND RIGHT GREATER SAPHENOUS VEIN HARVESTED ENDOSCOPICALLY;  Surgeon: Ivin Poot, MD;  Location: [facility];  Service: Open Heart Surgery;  Laterality: N/A;   INGUINAL HERNIA REPAIR Right 2000   LEFT HEART CATH AND CORONARY ANGIOGRAPHY N/A 11/12/2016   Procedure: Left Heart Cath and Coronary Angiography;  Surgeon: Lorretta Harp, MD;  Location: [facility];  Service: Cardiovascular;  Laterality: N/A;   TEE WITHOUT CARDIOVERSION N/A 11/17/2016   Procedure: TRANSESOPHAGEAL ECHOCARDIOGRAM (TEE);  Surgeon: Prescott Gum, Collier Salina, MD;  Location: [facility];  Service: Open Heart Surgery;   Laterality: N/A;   Social History:  reports that he has never smoked. He has never used smokeless tobacco. He reports current alcohol use of about 6.0 standard drinks per week. He reports that he does not use drugs. Family History:  Family History  Problem Relation Age of Onset   Diabetes Father    Diabetes Sister    Diabetes Paternal Grandfather    Cancer Neg Hx      HOME MEDICATIONS: Allergies as of 03/25/2021       Reactions   No Known Allergies         Medication List        Accurate as of March 25, 2021 11:02 AM. If you have any questions, ask your nurse or doctor.          aspirin EC 81 MG tablet Take 81 mg by mouth daily. Reported on 08/06/2015   dapagliflozin propanediol 5 MG Tabs tablet Commonly known as: Farxiga Take 1 tablet (5 mg total) by mouth daily.   Dexcom G6 Transmitter Misc Change every 10 days as needed/directed   fenofibrate 145 MG tablet Commonly known as: TRICOR 1 tablet   gabapentin 600 MG tablet Commonly known as: NEURONTIN Take 2 tablets by mouth every 12 (twelve) hours.   HumaLOG KwikPen 200 UNIT/ML KwikPen Generic drug: insulin lispro Max 100 units daily. Using sliding scale   Insulin Pen Needle 32G X 4 MM Misc 1 Device by Does not apply route in the morning, at noon, in the evening, and at bedtime.   Lantus SoloStar 100 UNIT/ML Solostar Pen Generic drug: insulin glargine Inject 30 Units into the skin daily.   metoprolol tartrate 25 MG tablet Commonly known as: LOPRESSOR Take 1 tablet (25 mg total) by mouth 2 (two) times daily.   Omnipod 5 G6 Intro (Gen 5) Kit 1 Device by Does not apply route every 3 (three) days.   Omnipod 5 G6 Pod (Gen 5) Misc 1 Device by Does not apply route every 3 (three) days.   OneTouch Delica Lancets 09O Misc 1 each by Does not apply route in the morning, at noon, in the evening, and at bedtime. Use Onetouch Delica lancets to check blood sugar 3-4 times daily.   OneTouch Ultra test  strip Generic drug: glucose blood 1 EACH BY OTHER ROUTE 3 (THREE) TIMES DAILY. USE ONETOUCH ULTRA TEST STRIPS AS INSTRUCTED TO CHECK BLOOD SUGAR 3-4 TIMES DAILY.   rosuvastatin 40 MG tablet Commonly known as: CRESTOR Take 1 tablet (40 mg total) by mouth daily.         OBJECTIVE:   Vital Signs: BP 130/72 (BP Location: Left Arm, Patient Position: Sitting, Cuff Size: Small)   Pulse 85   Ht 5' 11"  (1.803 m)   Wt 222 lb 6.4  oz (100.9 kg)   SpO2 95%   BMI 31.02 kg/m   Wt Readings from Last 3 Encounters:  03/25/21 222 lb 6.4 oz (100.9 kg)  03/24/21 220 lb (99.8 kg)  12/30/20 221 lb (100.2 kg)    Exam: General: Pt appears well and is in NAD  Lungs: Clear with good BS bilat with no rales, rhonchi, or wheezes  Heart: RRR   Extremities: No pretibial edema.   Neuro: MS is good with appropriate affect, pt is alert and Ox3    DM foot exam: 03/25/2021     The skin of the feet is intact without sores or ulcerations. The pedal pulses are 1+ on right and 1+ on left. The sensation is decreased to a screening 5.07, 10 gram monofilament bilaterally at the toes     DATA REVIEWED:  Lab Results  Component Value Date   HGBA1C 9.6 (A) 03/25/2021   HGBA1C 8.4 (A) 09/10/2020   HGBA1C 8.9 (A) 05/14/2020   Lab Results  Component Value Date   MICROALBUR 2.7 (H) 10/31/2019   LDLCALC 53 11/28/2020   CREATININE 1.01 11/28/2020   Lab Results  Component Value Date   MICRALBCREAT 6.0 10/31/2019     Lab Results  Component Value Date   CHOL 123 11/28/2020   HDL 30 (L) 11/28/2020   LDLCALC 53 11/28/2020   TRIG 252 (H) 11/28/2020   CHOLHDL 4.1 11/28/2020         ASSESSMENT / PLAN / RECOMMENDATIONS:   1) Type 2 Diabetes Mellitus, Poorly controlled, With Neuropathic and Macrovascular complications - Most recent A1c of 9.6 % %. Goal A1c < 7.0 %.    - Pt with worsening glycemic control , pt denies dietary indiscretions, he has been noted with weight gain  - Wilder Glade is  cost-prohibitive  - Unable to tolerate Metformin XR  - We discussed GLP-1 agonists and pioglitazone to improve insulin resistance. He has no hx of pancreatitis, cautioned against GI side effects  - He is waiting on Omnipod approval through his insurance     MEDICATIONS: Increase  Lantus 30 units daily  Continue Humalog  16 units with each meal  Start Farxiga 5 mg , 1 tablet daily  CF: Humalog (BG-130/25)   EDUCATION / INSTRUCTIONS: BG monitoring instructions: Patient is instructed to check his blood sugars 4 times a day, before meals and bedtime  Call [facility] if: BG persistently < 70  I reviewed the Rule of 15 for the treatment of hypoglycemia in detail with the patient. Literature supplied.    2) Diabetic complications:  Eye: Does not have known diabetic retinopathy.  Neuro/ Feet: Does have known diabetic peripheral neuropathy .  Renal: Patient does not have known baseline CKD. He   is not on an ACEI/ARB at present.   3) Dyslipidemia :  - LDL at goal, Tg high  Medication  Continue Rosuvtastatin 40 mg daily   F/U in 4 months    Signed electronically by: Mack Guise, MD  [facility]  [address], [place] [address] Phone: [phone] FAX: [phone]   CC: Leonard Downing, MD [address] [place] [address] Phone: [phone]  Fax: [phone]  Return to Endocrinology clinic as below: Future Appointments  Date Time Provider [place]  03/25/2021 11:10 AM Tonie Elsey, Melanie Crazier, MD [facility]

## 2021-03-25 NOTE — Patient Instructions (Addendum)
-   Increase Lantus to 40 units daily  - Increase Humalog to 22 units with each meal  - Start Trulicity 0.75 mg weekly   - Humalog correctional insulin: ADD extra units on insulin to your meal-time Humalog dose if your blood sugars are higher than 150. Use the scale below to help guide you:   Blood sugar before meal Number of units to inject  Less than 150 0 unit  151 - 170 1 units  171 - 190 2 units  191 - 210 3 units  211 - 230 4 units  231 - 250 5 units  251 - 270 6 units  271 - 290 7 units  291 - 310 8 units  311 - 330 9 units   331 - 350 10 units           HOW TO TREAT LOW BLOOD SUGARS (Blood sugar LESS THAN 70 MG/DL) Please follow the RULE OF 15 for the treatment of hypoglycemia treatment (when your (blood sugars are less than 70 mg/dL)   STEP 1: Take 15 grams of carbohydrates when your blood sugar is low, which includes:  3-4 GLUCOSE TABS  OR 3-4 OZ OF JUICE OR REGULAR SODA OR ONE TUBE OF GLUCOSE GEL    STEP 2: RECHECK blood sugar in 15 MINUTES STEP 3: If your blood sugar is still low at the 15 minute recheck --> then, go back to STEP 1 and treat AGAIN with another 15 grams of carbohydrates.

## 2021-04-24 ENCOUNTER — Telehealth: Payer: Self-pay | Admitting: Dietician

## 2021-04-24 NOTE — Telephone Encounter (Signed)
Patient called and stated that the Omnipod 5 is too expensive ($300) per month and wondered if there were some other financial support options.  His sister uses the Omnipod. Insurance:  [employer].  Called Corrie Dandy (sales for [employer]) who gave the following suggestions:  Ask insurance if they can make a tier reduction Are you in the donut hole?  Called patient and discussed the above.  He will consider following through to see if this can be made affordable.  Patient to call for any further questions.  Oran Rein, RD, LDN, CDCES

## 2021-05-01 ENCOUNTER — Ambulatory Visit: Payer: Medicare Other | Admitting: Sports Medicine

## 2021-05-01 ENCOUNTER — Ambulatory Visit
Admission: RE | Admit: 2021-05-01 | Discharge: 2021-05-01 | Disposition: A | Discharge status: Home / Self Care | Payer: Medicare Other | Source: Ambulatory Visit | Attending: Sports Medicine | Admitting: Sports Medicine

## 2021-05-01 VITALS — BP 164/94 | Ht 71.0 in | Wt 214.0 lb

## 2021-05-01 DIAGNOSIS — M5440 Lumbago with sciatica, unspecified side: Secondary | ICD-10-CM

## 2021-05-01 DIAGNOSIS — M25512 Pain in left shoulder: Secondary | ICD-10-CM

## 2021-05-01 DIAGNOSIS — G8929 Other chronic pain: Secondary | ICD-10-CM | POA: Diagnosis not present

## 2021-05-01 MED ORDER — METHYLPREDNISOLONE ACETATE 40 MG/ML IJ SUSP
40.0000 mg | Freq: Once | INTRAMUSCULAR | Status: AC
  Administered 2021-05-01: 14:00:00 40 mg via INTRA_ARTICULAR

## 2021-05-01 NOTE — Progress Notes (Signed)
[name] [name] is a 60 y.o. male who presents to [facility] today for the following:  Left Shoulder Pain Worsening over the last 5 years Progressively worse over the last few months Reports that it hurts to move his arm around and feels that his range of motion is limited Also states this pain for him to sleep on it and if he rolls on his arm, he wakes up at night Denies any injuries States overall he feels like it has been progressively worsening since he had a CABG 5 years ago States he is right-hand dominant States this feels similar to in the past when he had right shoulder pain and had an injection in his glenohumeral joint back in 2016 Denies any neck pain or numbness and tingling or radiation into his hand Has had no prior x-rays of his left shoulder  Left-sided low back pain with left-sided sciatica In passing he reported that this has been occurring for the last few years Reports occasional shooting pain into his left leg stopping at his knee States that walking usually makes the pain worse Denies any changes in the last few years He has been urinating normally No saddle anesthesias   PMH reviewed.  ROS as above. Medications reviewed.  Exam:  BP (!) 164/94    Ht 5\' 11"  (1.803 m)    Wt 214 lb (97.1 kg)    BMI 29.85 kg/m  Gen: Well NAD MSK:  Left Shoulder: Inspection reveals no obvious deformity, atrophy, or asymmetry b/l. No bruising. No swelling Palpation is normal with no TTP over [facility] joint or bicipital groove b/l. Left forward flexion limited to 110 degrees on the left, abduction to 90 degrees, external rotation to 30 degrees, internal rotation to back pocket Right forward flexion is full, abduction also full, external rotation limited to 45 degrees, internal rotation to L5 NV intact distally b/l Special Tests:  - Impingement: Slightly positive Hawkins - Supraspinatous: Negative empty can - Infraspinatous/Teres Minor: 5/5 strength with ER - Subscapularis: 5/5  strength with IR - Biceps tendon: Negative Speeds - Labrum: Negative Obriens, negative clunk, good stability - AC Joint: Negative cross arm   No results found.   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  Less likely to be rotator cuff/bursitis given his examination and he also has no weakness in his rotator cuff which is reassuring.  Patient opts to proceed with a glenohumeral joint ultrasound-guided injection today after discussing the risks and benefits.  Did specifically discussed with the patient that given his diabetes, he will need to monitor his blood sugars closely as there may be a slight increase in the short-term from his corticosteroid injection today.  We will have him obtain x-rays of the shoulder as well and follow-up in 4 to 6 weeks to assess how he is doing.  Procedure performed: Left Glenohumeral corticosteroid injection, ultrasound-guided  Ultrasound-guided left glenohumeral injection: After sterile prep with Betadine, injected 4 cc 1% lidocaine without epinephrine and 40 mg methylprednisolone using a 22-gauge spinal needle, passing the needle through approach into the glenohumeral joint.  Injectate was seen filling the joint capsule.  He had good immediate relief during the anesthetic phase.  Follow-up as directed.    Consent obtained and verified. Time-out conducted. Noted no overlying erythema, induration, or other  signs of local infection. The  left glenohumeral joint was visualized by ultrasound posteriorly.  The overlying skin was prepped in a sterile fashion. Anesthetic: 3 cc of 1% lidocaine without epinephrine Joint: left glenohumeral Needle: 22-gauge 3.5 inch Completed without difficulty. Meds: 4 cc 1% lidocaine without epinephrine and 40 mg  methylprednisolone    Chronic low back pain Mentions in passing, will defer examination to next visit in 4 to 6 weeks.  We will obtain lumbar spine films in the meantime.  No red flag symptoms on history and he has not had a change in this pain in the last few years which is reassuring.   [place] Constable, D.O.  PGY-4 [facility]  05/01/2021 2:25 PM  Patient seen and evaluated with the sports medicine fellow.  I agree with the above plan of care.  Patient has done well with a previous intra-articular right shoulder injection.  Left shoulder pain feels identical.  Therefore, we will inject the left glenohumeral joint today under ultrasound.  We will also get x-rays of both his left shoulder and his lumbar spine.  Follow-up with [place] again in 4 weeks at which time we will review his x-rays.

## 2021-05-01 NOTE — Assessment & Plan Note (Signed)
Mentions in passing, will defer examination to next visit in 4 to 6 weeks.  We will obtain lumbar spine films in the meantime.  No red flag symptoms on history and he has not had a change in this pain in the last few years which is reassuring.

## 2021-05-01 NOTE — Assessment & Plan Note (Addendum)
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  Less likely to be rotator cuff/bursitis given his examination and he also has no weakness in his rotator cuff which is reassuring.  Patient opts to proceed with a glenohumeral joint ultrasound-guided injection today after discussing the risks and benefits.  Did specifically discussed with the patient that given his diabetes, he will need to monitor his blood sugars closely as there may be a slight increase in the short-term from his corticosteroid injection today.  We will have him obtain x-rays of the shoulder as well and follow-up in 4 to 6 weeks to assess how he is doing.  Procedure performed: Left Glenohumeral corticosteroid injection, ultrasound-guided  Ultrasound-guided left glenohumeral injection: After sterile prep with Betadine, injected 4 cc 1% lidocaine without epinephrine and 40 mg methylprednisolone using a 22-gauge spinal needle, passing the needle through approach into the glenohumeral joint.  Injectate was seen filling the joint capsule.  He had good immediate relief during the anesthetic phase.  Follow-up as directed.    Consent obtained and verified. Time-out conducted. Noted no overlying erythema, induration, or other signs of local infection. The  left glenohumeral joint was visualized by ultrasound posteriorly.  The overlying skin was prepped in a sterile fashion. Anesthetic: 3 cc of 1% lidocaine without epinephrine Joint: left glenohumeral Needle: 22-gauge 3.5 inch Completed without difficulty. Meds: 4 cc 1% lidocaine without epinephrine and 40 mg methylprednisolone

## 2021-05-01 NOTE — Patient Instructions (Signed)
Thank you for coming to see me today. It was a pleasure. Today we talked about:   Today you received an injection with corticosteroid. This injection is usually done in response to pain and inflammation. There is some "numbing" medicine also in the shot so the injected area may be numb and feel really good for the next couple of hours. The numbing medicine usually wears off in 2-3 hours though, and then your pain level will be right back where it was before the injection.   The actually benefit from the steroid injection is usually noticed in 2-7 days. You may actually experience a small (as in 10%) INCREASE in pain in the first 24 hours---that is common.   Things to watch out for that you should contact us or a health care provider urgently would include: 1. Unusual (as in more than 10%) increase in pain 2. New fever > 101.5 3. New swelling or redness of the injected area.  4. Streaking of red lines around the area injected.   I have placed an order for x-rays of your shoulder and your back.  Please go to [facility] to have this completed.  You do not need an appointment.  We will contact you with your results afterwards.   Please follow-up with [place] in 4-6 weeks.  Merry Christmas!  If you have any questions or concerns, please do not hesitate to call the office at [phone].  Best,   Luis Abed, DO [facility]

## 2021-05-29 ENCOUNTER — Ambulatory Visit: Payer: Medicare Other | Admitting: Sports Medicine

## 2021-05-29 VITALS — BP 132/92 | Ht 71.0 in | Wt 217.0 lb

## 2021-05-29 DIAGNOSIS — M5440 Lumbago with sciatica, unspecified side: Secondary | ICD-10-CM

## 2021-05-29 DIAGNOSIS — G8929 Other chronic pain: Secondary | ICD-10-CM | POA: Diagnosis not present

## 2021-05-29 DIAGNOSIS — M25512 Pain in left shoulder: Secondary | ICD-10-CM | POA: Diagnosis not present

## 2021-05-29 NOTE — Assessment & Plan Note (Signed)
Improving after injection.  No significant arthropathy noted on x-rays.  We will give him rotator cuff exercises as well as range of motion exercises as it is most likely that he was suffering from adhesive capsulitis.  Follow-up as needed.

## 2021-05-29 NOTE — Assessment & Plan Note (Signed)
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  We will defer flexion and extension views to spine surgeon and referred to Dr. Yevette Edwards for further treatment and evaluation.

## 2021-05-29 NOTE — Patient Instructions (Signed)
We are going to refer you to Dr. Lynann Bologna for your back pain. His office will call you to schedule an appt. Please let us know if you don't hear from them within a week.   [place], [place]  Please start doing the shoulder exercises you were given today.  Call us with any questions!

## 2021-05-29 NOTE — Progress Notes (Signed)
[name] [name] is a 61 y.o. male who presents to [facility] today for the following:  Left shoulder pain Last seen for the same on 12/22 At that time received a glenohumeral joint injection X-rays also obtained of the left shoulder that did not show any significant arthropathy He has noticed significant improvement in his left shoulder pain He was not given any home exercises at last visit, but has noticed increased range of motion on his own He is very happy with the progress that he has made since the injection  Low back pain Patient had lumbar spine films performed prior to this appointment showing L5-S1 anteriorlisthesis, grade 2 with bilateral pars defects at L5 Reports that for the last 1 to 2 years, worsening over the last few months, he has had low back pain mostly on the left side that radiates to his left leg all the way down to his foot He states that he will be walking randomly and his leg will give out He states that he is even had to stop and sit down in the middle of [employer] due to the pain and weakness Denies any injuries recently, but states about 10 years ago he hurt his back while lifting equipment at work and felt a pop, but cannot remember what was wrong He denies any saddle anesthesias, changes in bowel or bladder habits, persistent leg weakness  PMH reviewed.  ROS as above. Medications reviewed.  Exam:  BP (!) 132/92    Ht 5\' 11"  (1.803 m)    Wt 217 lb (98.4 kg)    BMI 30.27 kg/m  Gen: Well NAD MSK:  Left shoulder: Inspection reveals no obvious deformity, atrophy, or asymmetry b/l. No bruising. No swelling Palpation is normal with no TTP over [facility] joint or bicipital groove b/l. Active forward flexion limited to 140 degrees, abduction to 90 degrees, external rotation is decreased 5 degrees from the right, internal rotation to L3 NV intact distally b/l Special Tests:  - Impingement: Neg Hawkins - Supraspinatous: Negative empty can - Infraspinatous/Teres Minor: 5/5  strength with ER - Subscapularis: 5/5 strength with IR - Biceps tendon: Negative Speeds - Labrum: Negative Obriens, negative clunk, good stability - AC Joint: Negative cross arm - Negative apprehension test  Lumbar spine: - Inspection: no gross deformity or asymmetry, swelling or ecchymosis - Palpation: No TTP over the spinous processes, paraspinal muscles, or SI joints b/l - ROM: Flexion slightly limited and extension limited with some mild pain - Strength: 5/5 strength of lower extremity in L4-S1 nerve root distributions b/l aside from 4+ strength in left hip flexion; normal gait - Neuro: sensation intact in the L4-S1 nerve root distribution b/l, 2+ L4 and S1 reflexes - Special testing: Negative straight leg raise, Negative FABER    Assessment and Plan: 1) Pain in joint of left shoulder Improving after injection.  No significant arthropathy noted on x-rays.  We will give him rotator cuff exercises as well as range of motion exercises as it is most likely that he was suffering from adhesive capsulitis.  Follow-up as needed.  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  We will defer flexion and extension views to spine surgeon and referred to Dr. Lynann Bologna for further treatment and evaluation.   [place] Constable, D.O.  PGY-4 [facility]  05/29/2021 3:52 PM  Patient seen and evaluated with the sports medicine fellow.  I agree with the above plan of  care.  I would like to refer the patient to Dr. Lynann Bologna for further work-up and treatment of his grade 2 L5-S1 spondylolisthesis.  He will follow-up with [place] as needed.

## 2021-06-03 ENCOUNTER — Other Ambulatory Visit: Payer: Self-pay | Admitting: Internal Medicine

## 2021-06-04 ENCOUNTER — Other Ambulatory Visit: Payer: Self-pay | Admitting: Internal Medicine

## 2021-06-04 MED ORDER — HUMALOG 100 UNIT/ML ~~LOC~~ SOLN: SUBCUTANEOUS | 6 refills | Status: DC

## 2021-06-10 ENCOUNTER — Other Ambulatory Visit: Payer: Self-pay

## 2021-06-10 MED ORDER — INSULIN LISPRO 100 UNIT/ML ~~LOC~~ SOLN: SUBCUTANEOUS | 0 refills | Status: DC

## 2021-06-10 NOTE — Telephone Encounter (Signed)
Script corrected and resent per pharmacy request.

## 2021-07-08 ENCOUNTER — Other Ambulatory Visit: Payer: Self-pay | Admitting: Orthopedic Surgery

## 2021-07-15 ENCOUNTER — Other Ambulatory Visit: Payer: Self-pay

## 2021-07-16 ENCOUNTER — Telehealth: Payer: Self-pay | Admitting: *Deleted

## 2021-07-16 ENCOUNTER — Telehealth: Payer: Self-pay

## 2021-07-16 ENCOUNTER — Ambulatory Visit (INDEPENDENT_AMBULATORY_CARE_PROVIDER_SITE_OTHER): Payer: Medicare Other | Admitting: Physician Assistant

## 2021-07-16 DIAGNOSIS — I2510 Atherosclerotic heart disease of native coronary artery without angina pectoris: Secondary | ICD-10-CM

## 2021-07-16 DIAGNOSIS — Z01810 Encounter for preprocedural cardiovascular examination: Secondary | ICD-10-CM | POA: Diagnosis not present

## 2021-07-16 DIAGNOSIS — I10 Essential (primary) hypertension: Secondary | ICD-10-CM

## 2021-07-16 NOTE — Telephone Encounter (Signed)
Pt has been added to the preop App's schedule for today, 9:40 for telephone visit. ? ?Consent on file.  ?

## 2021-07-16 NOTE — Telephone Encounter (Signed)
? ?  Pre-operative Risk Assessment  ?  ?Patient Name: [name] [name]  ?DOB: [birth year] ?MRN: [id number]  ? ?  ? ?Request for Surgical Clearance   ? ?Procedure:   LUMBAR 5-SACRUM / ANTERIOR LUMBAR INTERBODY FUSION AND POSTERIOR DECOMPRESSION AND FUSION WITH INSTRUMENTATION & ALLOGRAFT  ? ?Date of Surgery:  Clearance 07/31/21                              ?   ?Surgeon:  Estill Bamberg, MD  ?Surgeon's Group or Practice Name:  [facility] ?Phone number:  [phone] ?Fax number:  [phone] ?  ?Type of Clearance Requested:   ?- Medical  ?  ?Type of Anesthesia:  Not Indicated ?  ?Additional requests/questions:   ? ?Signed, ?Viviano Simas   ?07/16/2021, 7:47 AM   ?

## 2021-07-16 NOTE — Telephone Encounter (Signed)
Primary Cardiologist:Brian Dulce Sellar, MD ? ?Chart reviewed as part of pre-operative protocol coverage. Because of [name] [name] past medical history and time since last visit, he will require a telephone visit in order to better assess preoperative cardiovascular risk. ? ?Pre-op covering staff: ?- Please contact patient, obtain consent and schedule appointment  ? ?There is no additional request to hold antiplatelet/anticoagulation agent. ? ? ?[name], NP-C ? ?  ?07/16/2021, 8:28 AM ? [facility] ?[address], [address] ?Office [phone] Fax [phone] ? ? ?

## 2021-07-16 NOTE — Progress Notes (Signed)
? ?Virtual Visit via Telephone Note  ? ?This visit type was conducted due to national recommendations for restrictions regarding the COVID-19 Pandemic (e.g. social distancing) in an effort to limit this patient's exposure and mitigate transmission in our community.  Due to his co-morbid illnesses, this patient is at least at moderate risk for complications without adequate follow up.  This format is felt to be most appropriate for this patient at this time.  The patient did not have access to video technology/had technical difficulties with video requiring transitioning to audio format only (telephone).  All issues noted in this document were discussed and addressed.  No physical exam could be performed with this format.  Please refer to the patient's chart for his  consent to [facility]. ?Evaluation Performed:  Preoperative cardiovascular risk assessment ? ?This visit type was conducted due to national recommendations for restrictions regarding the COVID-19 Pandemic (e.g. social distancing).  This format is felt to be most appropriate for this patient at this time.  All issues noted in this document were discussed and addressed.  No physical exam was performed (except for noted visual exam findings with Video Visits).  Please refer to the patient's chart (MyChart message for video visits and phone note for telephone visits) for the patient's consent to telehealth [facility]. ?_____________  ? ?Date:  07/16/2021  ? ?Patient ID:  [name] Arguehomas D Teodoro, DOB 01/10/1961, MRN [id number] ?Patient Location:  ?Home ?Provider location:   ?Office ? ?Primary Care Provider:  [name], [name], MD ?Primary Cardiologist:  Norman HerrlichBrian Munley, MD ? ?Chief Complaint  ?  ?61 y.o. y/o male with a h/o CAD, HTN, HLD, [name] and DM, who is pending LUMBAR 5-SACRUM / ANTERIOR LUMBAR INTERBODY FUSION AND POSTERIOR DECOMPRESSION AND FUSION WITH INSTRUMENTATION & ALLOGRAFT , and presents today for telephonic preoperative  cardiovascular risk assessment. ? ?Past Medical History  ?  ?Past Medical History:  ?Diagnosis Date  ? Anxiety   ? Chronic lower back pain   ? Coronary artery disease 03/2004   ? 3 stents put in   ? Depression   ? Fibromyalgia   ? GERD (gastroesophageal reflux disease)   ? History of hiatal hernia   ? Hyperlipidemia LDL goal <70   ? Hypertension   ? Migraine   ? "weekly; back in the 1990s" (11/12/2016)  ? Myocardial infarction [facility](HCC) 2005  ? [name] (obstructive sleep apnea)   ? "quit wearing my mask" (11/12/2016)  ? Type II diabetes mellitus (HCC) 2010  ? ?Past Surgical History:  ?Procedure Laterality Date  ? CARDIAC CATHETERIZATION  11/12/2016  ? CORONARY ANGIOPLASTY WITH STENT PLACEMENT  03/2004   ? x 3 stents  ? CORONARY ARTERY BYPASS GRAFT N/A 11/17/2016  ? Procedure: CORONARY ARTERY BYPASS GRAFTING (CABG) x 5, USING RIGHT INTERNAL MAMMARY ARTERY AND RIGHT GREATER SAPHENOUS VEIN HARVESTED ENDOSCOPICALLY;  Surgeon: Kerin PernaVan Trigt, Peter, MD;  [facility];  Service: Open Heart Surgery;  Laterality: N/A;  ? INGUINAL HERNIA REPAIR Right 2000  ? LEFT HEART CATH AND CORONARY ANGIOGRAPHY N/A 11/12/2016  ? Procedure: Left Heart Cath and Coronary Angiography;  Surgeon: Runell GessBerry, Jonathan J, MD;  [facility];  Service: Cardiovascular;  Laterality: N/A;  ? TEE WITHOUT CARDIOVERSION N/A 11/17/2016  ? Procedure: TRANSESOPHAGEAL ECHOCARDIOGRAM (TEE);  Surgeon: Donata ClayVan Trigt, Theron AristaPeter, MD;  [facility];  Service: Open Heart Surgery;  Laterality: N/A;  ? ? ?Allergies ? ?Allergies  ?Allergen Reactions  ? No Known Allergies   ? ? ?  History of Present Illness  ?  ?[name] [name] is a 61 y.o. male who presents via Web designer for a telehealth visit today.  Pt was last seen in cardiology clinic on 11/28/20, by Dr. Dulce Sellar.  At that time [name] [name] was doing well well.  he is now pending lumbar surgery.  Since his last visit, he well. Limited ambulated due to severe back issue however about do house hold chores and  daily activity without any issue. He is able to get at least 4 Mets of activity. Recovering for COVID currently. The patient denies nausea, vomiting, fever, chest pain, palpitations, shortness of breath, orthopnea, PND, dizziness, syncope, abdominal pain, hematochezia, melena, or lower extremity edema.  ? ?Home Medications  ?  ?Prior to Admission medications   ?Medication Sig Start Date End Date Taking? Authorizing Provider  ?aspirin EC 81 MG tablet Take 81 mg by mouth daily. Reported on 08/06/2015    [provider]  ?Continuous Blood Gluc Transmit (DEXCOM G6 TRANSMITTER) MISC Change every 10 days as needed/directed 02/13/20   Shamleffer, Konrad Dolores, MD  ?gabapentin (NEURONTIN) 600 MG tablet Take 2 tablets by mouth every 12 (twelve) hours. 01/29/21   [provider]  ?insulin lispro (HUMALOG) 100 UNIT/ML injection MAX DAILY 100 UNITS, this is correct dose in vials and not pens 06/10/21   Shamleffer, Konrad Dolores, MD  ?Insulin Pen Needle 29G X MISC 1 Device by Does not apply route in the morning, at noon, in the evening, and at bedtime. 03/25/21   Shamleffer, Konrad Dolores, MD  ?lisinopril (ZESTRIL) 10 MG tablet Take 10 mg by mouth daily.    [provider]  ?meloxicam (MOBIC) 15 MG tablet Take 15 mg by mouth daily.    [provider]  ?metFORMIN (GLUCOPHAGE) 500 MG tablet Take 500 mg by mouth in the morning, at noon, and at bedtime.    [provider]  ?metoprolol tartrate (LOPRESSOR) 25 MG tablet Take 1 tablet (25 mg total) by mouth 2 (two) times daily. 11/25/16   Ardelle Balls, PA-C  ?OneTouch Delica Lancets 30G MISC 1 each by Does not apply route in the morning, at noon, in the evening, and at bedtime. Use Onetouch Delica lancets to check blood sugar 3-4 times daily.    [provider]  ?Koren Bound test strip 1 EACH BY OTHER ROUTE 3 (THREE) TIMES DAILY. USE ONETOUCH ULTRA TEST STRIPS AS INSTRUCTED TO CHECK BLOOD SUGAR 3-4 TIMES DAILY.  09/11/20   Shamleffer, Konrad Dolores, MD  ?rosuvastatin (CRESTOR) 40 MG tablet Take 1 tablet (40 mg total) by mouth daily. 01/14/21   Baldo Daub, MD  ? ? ?Physical Exam  ?  ?Vital Signs:  [name] [name] does not have vital signs available for review today. ? ?Given telephonic nature of communication, physical exam is limited. ?AAOx3. NAD. Normal affect.  Speech and respirations are unlabored. ? ?Accessory Clinical Findings  ?  ?None ? ?Assessment & Plan  ?  ?1.  Preoperative Cardiovascular Risk Assessment: ?Given past medical history and time since last visit, based on ACC/AHA guidelines, [name] [name] would be at acceptable risk for the planned procedure without further cardiovascular testing.  ? ?The patient was advised that if he develops new symptoms prior to surgery to contact our office to arrange for a follow-up visit, and he verbalized understanding. ? ?I will route this recommendation to the requesting party via Epic fax function and remove from pre-op pool. ? ?Please call with questions. ? ? ?  COVID-19 Education:7 ?The signs and symptoms of COVID-19 were discussed with the patient and how to seek care for testing (follow up with PCP or arrange E-visit).  The importance of social distancing was discussed today. ? ?Patient Risk:   ?After full review of this patient's history and clinical status, I feel that he is at least moderate risk for cardiac complications at this time, thus necessitating a telehealth visit sooner than our first available in office visit. ? ?Time:   ?Today, I have spent 7 minutes with the patient with telehealth technology discussing medical history, symptoms, and management plan.   ? ? ?Manson Passey, [place] ? ?07/16/2021, 8:52 AM  ?

## 2021-07-16 NOTE — Telephone Encounter (Signed)
Pt  has been added to the preop schedule for a telephone visit today. ? ?Medication reconciliation has been completed. ? ?  ?Patient Consent for Virtual Visit  ? ? ?   ? ?[name] [name] has provided verbal consent on 07/16/2021 for a virtual visit (video or telephone). ? ? ?CONSENT FOR VIRTUAL VISIT FOR:  [name] [name]  ?By participating in this virtual visit I agree to the following: ? ?I hereby voluntarily request, consent and authorize [facility] and its employed or contracted physicians, physician assistants, nurse practitioners or other licensed health care professionals (the Practitioner), to provide me with telemedicine health care services (the ?Services") as deemed necessary by the treating Practitioner. I acknowledge and consent to receive the Services by the Practitioner via telemedicine. I understand that the telemedicine visit will involve communicating with the Practitioner through live audiovisual communication technology and the disclosure of certain medical information by electronic transmission. I acknowledge that I have been given the opportunity to request an in-person assessment or other available alternative prior to the telemedicine visit and am voluntarily participating in the telemedicine visit. ? ?I understand that I have the right to withhold or withdraw my consent to the use of telemedicine in the course of my care at any time, without affecting my right to future care or treatment, and that the Practitioner or I may terminate the telemedicine visit at any time. I understand that I have the right to inspect all information obtained and/or recorded in the course of the telemedicine visit and may receive copies of available information for a reasonable fee.  I understand that some of the potential risks of receiving the Services via telemedicine include:  ?Delay or interruption in medical evaluation due to technological equipment failure or disruption; ?Information transmitted may not  be sufficient (e.g. poor resolution of images) to allow for appropriate medical decision making by the Practitioner; and/or  ?In rare instances, security protocols could fail, causing a breach of personal health information. ? ?Furthermore, I acknowledge that it is my responsibility to provide information about my medical history, conditions and care that is complete and accurate to the best of my ability. I acknowledge that Practitioner's advice, recommendations, and/or decision may be based on factors not within their control, such as incomplete or inaccurate data provided by me or distortions of diagnostic images or specimens that may result from electronic transmissions. I understand that the practice of medicine is not an exact science and that Practitioner makes no warranties or guarantees regarding treatment outcomes. I acknowledge that a copy of this consent can be made available to me via my patient portal [facility] MyChart), or I can request a printed copy by calling the office of [facility].   ? ?I understand that my insurance will be billed for this visit.  ? ?I have read or had this consent read to me. ?I understand the contents of this consent, which adequately explains the benefits and risks of the Services being provided via telemedicine.  ?I have been provided ample opportunity to ask questions regarding this consent and the Services and have had my questions answered to my satisfaction. ?I give my informed consent for the services to be provided through the use of telemedicine in my medical care ? ?  ?

## 2021-07-17 ENCOUNTER — Other Ambulatory Visit: Payer: Self-pay | Admitting: Family Medicine

## 2021-07-17 DIAGNOSIS — H9313 Tinnitus, bilateral: Secondary | ICD-10-CM

## 2021-07-21 NOTE — Progress Notes (Unsigned)
[facility] [place]  ASSESSMENT / PLAN: Kathleen Arguehomas D Peet is a 61 y.o. male with plans to undergo *** anterior lumbar interbody fusion on *** with ***. I have reviewed the patient's imaging and feel it is safe to approach this anteriorly ***. I discussed the specific risks associated with spine exposure including vascular injury, urethral injury, bowel injury, nervous injury. I explained that if severe vascular injury was encountered we may need to abandon the spinal procedure to safely repair the injury. I explained the typical postoperative course for anterior exposure of the spine, including small risk of postoperative ileus. The patient was understanding and wished to proceed.    CHIEF COMPLAINT: ***  HISTORY OF PRESENT ILLNESS: Kathleen Arguehomas D Mckay is a 61 y.o. male ***  VASCULAR SURGICAL HISTORY: ***  VASCULAR RISK FACTORS: {FINDINGS; POSITIVE NEGATIVE:[id number]} history of stroke / transient ischemic attack. {FINDINGS; POSITIVE NEGATIVE:[id number]} history of coronary artery disease. *** history of PCI. *** history of CABG.  {FINDINGS; POSITIVE NEGATIVE:[id number]} history of diabetes mellitus. Last A1c ***. {FINDINGS; POSITIVE NEGATIVE:[id number]} history of smoking. *** actively smoking. {FINDINGS; POSITIVE NEGATIVE:[id number]} history of hypertension. *** drug regimen with *** control. {FINDINGS; POSITIVE NEGATIVE:[phone]} history of chronic kidney disease.  Last GFR ***. CKD {stage:30421363}. {FINDINGS; POSITIVE NEGATIVE:[phone]} history of chronic obstructive pulmonary disease, treated with ***.  FUNCTIONAL STATUS: ECOG performance status: {findings; ecog performance status:31780} Ambulatory status: {TNHAmbulation:25868}  Past Medical History:  Diagnosis Date   Anxiety    Chronic lower back pain    Coronary artery disease 03/2004   3 stents put in    Depression    Fibromyalgia    GERD (gastroesophageal reflux disease)    History of hiatal hernia     Hyperlipidemia LDL goal <70    Hypertension    Left leg pain    Low back pain    Migraine    "weekly; back in the 1990s" (11/12/2016)   Myocardial infarction (HCC) 2005   Neuroforaminal stenosis of lumbar spine    L5-S1   [name] (obstructive sleep apnea)    "quit wearing my mask" (11/12/2016)   Spondylolisthesis, grade 2    Type II diabetes mellitus (HCC) 2010    Past Surgical History:  Procedure Laterality Date   CARDIAC CATHETERIZATION  11/12/2016   CORONARY ANGIOPLASTY WITH STENT PLACEMENT  03/2004    x 3 stents   CORONARY ARTERY BYPASS GRAFT N/A 11/17/2016   Procedure: CORONARY ARTERY BYPASS GRAFTING (CABG) x 5, USING RIGHT INTERNAL MAMMARY ARTERY AND RIGHT GREATER SAPHENOUS VEIN HARVESTED ENDOSCOPICALLY;  Surgeon: Kerin PernaVan Trigt, Peter, MD;  [facility];  Service: Open Heart Surgery;  Laterality: N/A;   INGUINAL HERNIA REPAIR Right 2000   LEFT HEART CATH AND CORONARY ANGIOGRAPHY N/A 11/12/2016   Procedure: Left Heart Cath and Coronary Angiography;  Surgeon: Runell GessBerry, Jonathan J, MD;  [facility];  Service: Cardiovascular;  Laterality: N/A;   TEE WITHOUT CARDIOVERSION N/A 11/17/2016   Procedure: TRANSESOPHAGEAL ECHOCARDIOGRAM (TEE);  Surgeon: Donata ClayVan Trigt, Theron AristaPeter, MD;  [facility];  Service: Open Heart Surgery;  Laterality: N/A;    Family History  Problem Relation Age of Onset   Diabetes Father    Diabetes Sister    Diabetes Paternal Grandfather    Cancer Neg Hx     Social History   Socioeconomic History   Marital status: Divorced    Spouse name: Not on file   Number of children: 1    Years of education: 12    Highest education  level: Not on file  Occupational History   Occupation: Unemployed   Tobacco Use   Smoking status: Never   Smokeless tobacco: Never  Vaping Use   Vaping Use: Never used  Substance and Sexual Activity   Alcohol use: Yes    Alcohol/week: 6.0 standard drinks    Types: 6 Cans of beer per week   Drug use: No   Sexual activity: Never   Other Topics Concern   Not on file  Social History Narrative   Lives alone.    Daughter lives nearby,5 miles away   Sister next door.    1 grandchild, 31 yo granddaughter    Social Determinants of Corporate investment banker Strain: Not on file  Food Insecurity: Not on file  Transportation Needs: Not on file  Physical Activity: Not on file  Stress: Not on file  Social Connections: Not on file  Intimate Partner Violence: Not on file    Allergies  Allergen Reactions   No Known Allergies     Current Outpatient Medications  Medication Sig Dispense Refill   aspirin EC 81 MG tablet Take 81 mg by mouth daily. Reported on 08/06/2015     Continuous Blood Gluc Transmit (DEXCOM G6 TRANSMITTER) MISC Change every 10 days as needed/directed 4 each 6   gabapentin (NEURONTIN) 600 MG tablet Take 2 tablets by mouth every 12 (twelve) hours.     insulin lispro (HUMALOG) 100 UNIT/ML injection MAX DAILY 100 UNITS, this is correct dose in vials and not pens 60 mL 0   Insulin Pen Needle 29G X MISC 1 Device by Does not apply route in the morning, at noon, in the evening, and at bedtime. 400 each 3   lisinopril (ZESTRIL) 10 MG tablet Take 10 mg by mouth daily.     meloxicam (MOBIC) 15 MG tablet Take 15 mg by mouth daily.     metFORMIN (GLUCOPHAGE) 500 MG tablet Take 500 mg by mouth in the morning, at noon, and at bedtime.     metoprolol tartrate (LOPRESSOR) 25 MG tablet Take 1 tablet (25 mg total) by mouth 2 (two) times daily. 60 tablet 1   OneTouch Delica Lancets 30G MISC 1 each by Does not apply route in the morning, at noon, in the evening, and at bedtime. Use Onetouch Delica lancets to check blood sugar 3-4 times daily.     ONETOUCH ULTRA test strip 1 EACH BY OTHER ROUTE 3 (THREE) TIMES DAILY. USE ONETOUCH ULTRA TEST STRIPS AS INSTRUCTED TO CHECK BLOOD SUGAR 3-4 TIMES DAILY. 300 strip 3   rosuvastatin (CRESTOR) 40 MG tablet Take 1 tablet (40 mg total) by mouth daily. 90 tablet 3   No current  facility-administered medications for this visit.    PHYSICAL EXAM There were no vitals filed for this visit.  Constitutional: *** appearing. *** distress. Appears *** nourished.  Neurologic: CN ***. *** focal findings. *** sensory loss. Psychiatric: *** Mood and affect symmetric and appropriate. Eyes: *** No icterus. No conjunctival pallor. Ears, nose, throat: *** mucous membranes moist. Midline trachea.  Cardiac: *** rate and rhythm.  Respiratory: *** unlabored. Abdominal: *** soft, non-tender, non-distended.  Peripheral vascular: *** Extremity: *** edema. *** cyanosis. *** pallor.  Skin: *** gangrene. *** ulceration.  Lymphatic: *** Stemmer's sign. *** palpable lymphadenopathy.  PERTINENT LABORATORY AND RADIOLOGIC DATA  Most recent CBC CBC Latest Ref Rng & Units 11/24/2016 11/22/2016 11/20/2016  WBC 4.0 - 10.5 K/uL 8.8 6.6 11.2(H)  Hemoglobin 13.0 - 17.0 g/dL 11.5(L) 9.9(L)  10.5(L)  Hematocrit 39.0 - 52.0 % 34.8(L) 30.5(L) 32.1(L)  Platelets 150 - 400 K/uL 279 187 148(L)     Most recent CMP CMP Latest Ref Rng & Units 11/28/2020 02/06/2020 10/31/2019  Glucose 65 - 99 mg/dL 73 - 829(F)  BUN 8 - 27 mg/dL 21 - 17  Creatinine 6.21 - 1.27 mg/dL 3.08 - 6.57  Sodium 846 - 144 mmol/L 146(H) - 135  Potassium 3.5 - 5.2 mmol/L 4.5 - 4.6  Chloride 96 - 106 mmol/L 109(H) - 103  CO2 20 - 29 mmol/L 20 - 24  Calcium 8.6 - 10.2 mg/dL 96.2 - 9.9  Total Protein 6.0 - 8.5 g/dL 7.1 6.9 -  Total Bilirubin 0.0 - 1.2 mg/dL 0.6 0.6 -  Alkaline Phos 44 - 121 IU/L 85 90 -  AST 0 - 40 IU/L 34 21 -  ALT 0 - 44 IU/L 33 21 -    Renal function CrCl cannot be calculated (Patient's most recent lab result is older than the maximum 21 days allowed.).  Hemoglobin A1C (%)  Date Value  03/25/2021 9.6 (A)   Hgb A1c MFr Bld (%)  Date Value  11/13/2016 12.0 (H)    LDL Chol Calc (NIH)  Date Value Ref Range Status  11/28/2020 53 0 - 99 mg/dL Final     Vascular Imaging: ***  Jermery N. Lenell Antu,  MD [facility] Phone Number: [phone] 07/21/2021 9:24 PM  Total time spent on preparing this encounter including chart review, data review, collecting history, examining the patient, coordinating care for this {tnhtimebilling:26202}  Portions of this report may have been transcribed using voice recognition software.  Every effort has been made to ensure accuracy; however, inadvertent computerized transcription errors may still be present.

## 2021-07-21 NOTE — H&P (View-Only) (Signed)
[facility] [place] ? ?ASSESSMENT / PLAN: ?[name] Arguehomas D Aul is a 61 y.o. male with plans to undergo L5/S1 anterior lumbar interbody fusion on 07/31/2021 with Dr. Yevette Edwardsumonski. I have reviewed the patient's imaging and feel it is safe to approach this anteriorly. I discussed the specific risks associated with spine exposure including vascular injury, urethral injury, bowel injury, nervous injury. I explained that if severe vascular injury was encountered we may need to abandon the spinal procedure to safely repair the injury. I explained the typical postoperative course for anterior exposure of the spine, including small risk of postoperative ileus. The patient was understanding and wished to proceed.  ? ?CHIEF COMPLAINT: Back pain ? ?HISTORY OF PRESENT ILLNESS: ?[name] [name] is a 61 y.o. male referred to clinic for evaluation of anterior exposure of the L5/S1 disc space.  The patient reports no history of abdominal surgery.  He has had a right inguinal hernia repair.  We reviewed the usual operative conduct of the anterior exposure of the spine and the potential risks of this approach. ? ?Past Medical History:  ?Diagnosis Date  ? Anxiety   ? Chronic lower back pain   ? Coronary artery disease 03/2004  ? 3 stents put in   ? Depression   ? Fibromyalgia   ? GERD (gastroesophageal reflux disease)   ? History of hiatal hernia   ? Hyperlipidemia LDL goal <70   ? Hypertension   ? Left leg pain   ? Low back pain   ? Migraine   ? "weekly; back in the 1990s" (11/12/2016)  ? Myocardial infarction [facility](HCC) 2005  ? Neuroforaminal stenosis of lumbar spine   ? L5-S1  ? [name] (obstructive sleep apnea)   ? "quit wearing my mask" (11/12/2016)  ? Spondylolisthesis, grade 2   ? Type II diabetes mellitus (HCC) 2010  ? ? ?Past Surgical History:  ?Procedure Laterality Date  ? CARDIAC CATHETERIZATION  11/12/2016  ? CORONARY ANGIOPLASTY WITH STENT PLACEMENT  03/2004   ? x 3 stents  ? CORONARY ARTERY BYPASS GRAFT N/A 11/17/2016  ?  Procedure: CORONARY ARTERY BYPASS GRAFTING (CABG) x 5, USING RIGHT INTERNAL MAMMARY ARTERY AND RIGHT GREATER SAPHENOUS VEIN HARVESTED ENDOSCOPICALLY;  Surgeon: Kerin PernaVan Trigt, Peter, MD;  [facility];  Service: Open Heart Surgery;  Laterality: N/A;  ? INGUINAL HERNIA REPAIR Right 2000  ? LEFT HEART CATH AND CORONARY ANGIOGRAPHY N/A 11/12/2016  ? Procedure: Left Heart Cath and Coronary Angiography;  Surgeon: Runell GessBerry, Jonathan J, MD;  [facility];  Service: Cardiovascular;  Laterality: N/A;  ? TEE WITHOUT CARDIOVERSION N/A 11/17/2016  ? Procedure: TRANSESOPHAGEAL ECHOCARDIOGRAM (TEE);  Surgeon: Donata ClayVan Trigt, Theron AristaPeter, MD;  [facility];  Service: Open Heart Surgery;  Laterality: N/A;  ? ? ?Family History  ?Problem Relation Age of Onset  ? Diabetes Father   ? Diabetes Sister   ? Diabetes Paternal Grandfather   ? Cancer Neg Hx   ? ? ?Social History  ? ?Socioeconomic History  ? Marital status: Divorced  ?  Spouse name: Not on file  ? Number of children: 1   ? Years of education: 3912   ? Highest education level: Not on file  ?Occupational History  ? Occupation: Unemployed   ?Tobacco Use  ? Smoking status: Never  ? Smokeless tobacco: Never  ?Vaping Use  ? Vaping Use: Never used  ?Substance and Sexual Activity  ? Alcohol use: Yes  ?  Alcohol/week: 6.0 standard drinks  ?  Types:  6 Cans of beer per week  ? Drug use: No  ? Sexual activity: Never  ?Other Topics Concern  ? Not on file  ?Social History Narrative  ? Lives alone.   ? Daughter lives nearby,5 miles away  ? Sister next door.   ? 1 grandchild, 29 yo granddaughter   ? ?Social Determinants of Health  ? ?Financial Resource Strain: Not on file  ?Food Insecurity: Not on file  ?Transportation Needs: Not on file  ?Physical Activity: Not on file  ?Stress: Not on file  ?Social Connections: Not on file  ?Intimate Partner Violence: Not on file  ? ? ?Allergies  ?Allergen Reactions  ? No Known Allergies   ? ? ?Current Outpatient Medications  ?Medication Sig Dispense Refill  ?  aspirin EC 81 MG tablet Take 81 mg by mouth daily. Reported on 08/06/2015    ? Continuous Blood Gluc Transmit (DEXCOM G6 TRANSMITTER) MISC Change every 10 days as needed/directed 4 each 6  ? gabapentin (NEURONTIN) 600 MG tablet Take 2 tablets by mouth every 12 (twelve) hours.    ? insulin lispro (HUMALOG) 100 UNIT/ML injection MAX DAILY 100 UNITS, this is correct dose in vials and not pens 60 mL 0  ? Insulin Pen Needle 29G X MISC 1 Device by Does not apply route in the morning, at noon, in the evening, and at bedtime. 400 each 3  ? lisinopril (ZESTRIL) 10 MG tablet Take 10 mg by mouth daily.    ? meloxicam (MOBIC) 15 MG tablet Take 15 mg by mouth daily.    ? metFORMIN (GLUCOPHAGE) 500 MG tablet Take 500 mg by mouth in the morning, at noon, and at bedtime.    ? metoprolol tartrate (LOPRESSOR) 25 MG tablet Take 1 tablet (25 mg total) by mouth 2 (two) times daily. 60 tablet 1  ? OneTouch Delica Lancets 30G MISC 1 each by Does not apply route in the morning, at noon, in the evening, and at bedtime. Use Onetouch Delica lancets to check blood sugar 3-4 times daily.    ? ONETOUCH ULTRA test strip 1 EACH BY OTHER ROUTE 3 (THREE) TIMES DAILY. USE ONETOUCH ULTRA TEST STRIPS AS INSTRUCTED TO CHECK BLOOD SUGAR 3-4 TIMES DAILY. 300 strip 3  ? rosuvastatin (CRESTOR) 40 MG tablet Take 1 tablet (40 mg total) by mouth daily. 90 tablet 3  ? ?No current facility-administered medications for this visit.  ? ? ?PHYSICAL EXAM ?Vitals:  ? 07/22/21 1441  ?BP: 136/88  ?Pulse: 100  ?Resp: 20  ?Temp: 99.4 ?F (37.4 ?C)  ?SpO2: 95%  ?Weight: 215 lb (97.5 kg)  ?Height: 5\' 11"  (1.803 m)  ? ? ?Constitutional: well appearing. no distress. Appears well nourished.  ?Neurologic: CN intact. no focal findings. no sensory loss. ?Psychiatric:  Mood and affect symmetric and appropriate. ?Eyes:  No icterus. No conjunctival pallor. ?Ears, nose, throat:  mucous membranes moist. Midline trachea.  ?Cardiac: regular rate and rhythm.  ?Respiratory:   unlabored. ?Abdominal:  soft, non-tender, non-distended.  ?Peripheral vascular: 2+ PT pulses ?Extremity: no edema. no cyanosis. no pallor.  ?Skin: no gangrene. no ulceration.  ?Lymphatic: no Stemmer's sign. no palpable lymphadenopathy. ? ?PERTINENT LABORATORY AND RADIOLOGIC DATA ? ?Most recent CBC ?CBC Latest Ref Rng & Units 11/24/2016 11/22/2016 11/20/2016  ?WBC 4.0 - 10.5 K/uL 8.8 6.6 11.2(H)  ?Hemoglobin 13.0 - 17.0 g/dL 11.5(L) 9.9(L) 10.5(L)  ?Hematocrit 39.0 - 52.0 % 34.8(L) 30.5(L) 32.1(L)  ?Platelets 150 - 400 K/uL 279 187 148(L)  ?  ? ?Most  recent CMP ?CMP Latest Ref Rng & Units 11/28/2020 02/06/2020 10/31/2019  ?Glucose 65 - 99 mg/dL 73 - 419(Q)  ?BUN 8 - 27 mg/dL 21 - 17  ?Creatinine 0.76 - 1.27 mg/dL 2.22 - 9.79  ?Sodium 134 - 144 mmol/L 146(H) - 135  ?Potassium 3.5 - 5.2 mmol/L 4.5 - 4.6  ?Chloride 96 - 106 mmol/L 109(H) - 103  ?CO2 20 - 29 mmol/L 20 - 24  ?Calcium 8.6 - 10.2 mg/dL 89.2 - 9.9  ?Total Protein 6.0 - 8.5 g/dL 7.1 6.9 -  ?Total Bilirubin 0.0 - 1.2 mg/dL 0.6 0.6 -  ?Alkaline Phos 44 - 121 IU/L 85 90 -  ?AST 0 - 40 IU/L 34 21 -  ?ALT 0 - 44 IU/L 33 21 -  ? ? ?Renal function ?CrCl cannot be calculated (Patient's most recent lab result is older than the maximum 21 days allowed.). ? ?Hemoglobin A1C (%)  ?Date Value  ?03/25/2021 9.6 (A)  ? ?Hgb A1c MFr Bld (%)  ?Date Value  ?11/13/2016 12.0 (H)  ? ? ?LDL Chol Calc (NIH)  ?Date Value Ref Range Status  ?11/28/2020 53 0 - 99 mg/dL Final  ?  ? ?Vascular Imaging: ?MRI of lumbar spine personally reviewed. ?The confluence of the IVC and aortic bifurcation are near the L5/S1 disc base. ?I think a moderate amount of mobilization will be necessary to achieve adequate exposure of the L5/S1 disc base. ?There is no aberrant arterial anatomy to exclude him from anterior exposure of the L5/S1 disc space. ? ?Rande Brunt. Lenell Antu, MD ?[facility] [place] ?Office Phone Number: [phone] ?07/22/2021 7:34 PM ? ?Total time spent on preparing this  encounter including chart review, data review, collecting history, examining the patient, coordinating care for this new patient, 45 minutes. ? ?Portions of this report may have been transcribed using voice reco

## 2021-07-22 ENCOUNTER — Encounter: Payer: Self-pay | Admitting: Vascular Surgery

## 2021-07-22 ENCOUNTER — Ambulatory Visit: Payer: Medicare Other | Admitting: Vascular Surgery

## 2021-07-22 ENCOUNTER — Other Ambulatory Visit: Payer: Self-pay

## 2021-07-22 VITALS — BP 136/88 | HR 100 | Temp 99.4°F | Resp 20 | Ht 71.0 in | Wt 215.0 lb

## 2021-07-22 DIAGNOSIS — M545 Low back pain, unspecified: Secondary | ICD-10-CM

## 2021-07-22 DIAGNOSIS — G8929 Other chronic pain: Secondary | ICD-10-CM | POA: Diagnosis not present

## 2021-07-24 ENCOUNTER — Other Ambulatory Visit: Payer: Self-pay | Admitting: Internal Medicine

## 2021-07-25 NOTE — Progress Notes (Signed)
Surgical Instructions ? ? ? Your procedure is scheduled on Thursday March 23rd. ? Report to [facility] Main Entrance "A" at 5:30 A.M., then check in with the Admitting office. ? Call this number if you have problems the morning of surgery: ? [phone] ? ? If you have any questions prior to your surgery date call [phone]: Open Monday-Friday 8am-4pm ? ? ? Remember: ? Do not eat after midnight the night before your surgery ? ?You may drink clear liquids until 4:30am the morning of your surgery.   ?Clear liquids allowed are: Water, Non-Citrus Juices (without pulp), Carbonated Beverages, Clear Tea, Black Coffee ONLY (NO MILK, CREAM OR POWDERED CREAMER of any kind), and Gatorade ? ? ?Enhanced Recovery after Surgery for Orthopedics ?Enhanced Recovery after Surgery is a protocol used to improve the stress on your body and your recovery after surgery. ? ?Patient Instructions ? ?The day of surgery (if you have diabetes): ? ?Drink ONE small 10 oz bottle of water by ___4:30__ am the morning of surgery ?This bottle was given to you during your hospital  ?pre-op appointment visit.  ?Nothing else to drink after completing the  ?Small 10 oz bottle of water. ? ?       If you have questions, please contact your surgeon?s office. ? ?  ? Take these medicines the morning of surgery with A SIP OF WATER: ?gabapentin (NEURONTIN) 600 MG tablet ?metoprolol tartrate (LOPRESSOR) 25 MG tablet ?rosuvastatin (CRESTOR) 40 MG tablet ? ? ?WHAT DO I DO ABOUT MY DIABETES MEDICATION? ? ? ?Do not take oral diabetes medicines (Metformin) the morning of surgery. ? ?THE DAY BEFORE SURGERY you may take usual dose of insulin Lispro / Humalog before meals. ? ?THE NIGHT BEFORE SURGERY, DO NOT take bedtime dose of Insulin Lispro / Humalog.     ?The day of surgery, do not take other diabetes injectables, including Byetta (exenatide), Bydureon (exenatide ER), Victoza (liraglutide), or Trulicity (dulaglutide). ? ?If your CBG is greater than 220 mg/dL, you  may take ? of your sliding scale (correction) dose of insulin Lispro (Humalog). ? ? ?HOW TO MANAGE YOUR DIABETES ?BEFORE AND AFTER SURGERY ? ?Why is it important to control my blood sugar before and after surgery? ?Improving blood sugar levels before and after surgery helps healing and can limit problems. ?A way of improving blood sugar control is eating a healthy diet by: ? Eating less sugar and carbohydrates ? Increasing activity/exercise ? Talking with your doctor about reaching your blood sugar goals ?High blood sugars (greater than 180 mg/dL) can raise your risk of infections and slow your recovery, so you will need to focus on controlling your diabetes during the weeks before surgery. ?Make sure that the doctor who takes care of your diabetes knows about your planned surgery including the date and location. ? ?How do I manage my blood sugar before surgery? ?Check your blood sugar at least 4 times a day, starting 2 days before surgery, to make sure that the level is not too high or low. ? ?Check your blood sugar the morning of your surgery when you wake up and every 2 hours until you get to the Short Stay unit. ? ?If your blood sugar is less than 70 mg/dL, you will need to treat for low blood sugar: ?Do not take insulin. ?Treat a low blood sugar (less than 70 mg/dL) with ? cup of clear juice (cranberry or apple), 4 glucose tablets, OR glucose gel. ?Recheck blood sugar in 15 minutes after treatment (to  make sure it is greater than 70 mg/dL). If your blood sugar is not greater than 70 mg/dL on recheck, call [phone] for further instructions. ?Report your blood sugar to the short stay nurse when you get to Short Stay. ? ?If you are admitted to the hospital after surgery: ?Your blood sugar will be checked by the staff and you will probably be given insulin after surgery (instead of oral diabetes medicines) to make sure you have good blood sugar levels. ?The goal for blood sugar control after surgery is 80-180  mg/dL. ? ? ?Follow your surgeon's instructions on when to stop Aspirin.  If no instructions were given by your surgeon then you will need to call the office to get those instructions.   ? ?As of today, STOP taking any Aspirin (unless otherwise instructed by your surgeon) Aleve, Meloxicam, Naproxen, Ibuprofen, Motrin, Advil, Goody's, BC's, all herbal medications, fish oil, and all vitamins. ? ?         ?Do not wear jewelry  ?Do not wear lotions, powders, colognes, or deodorant. ?Do not shave 48 hours prior to surgery.  Men may shave face and neck. ?Do not bring valuables to the hospital. ?Do not wear nail polish, gel polish, artificial nails, or any other type of covering on natural nails (fingers and toes) ?If you have artificial nails or gel coating that need to be removed by a nail salon, please have this removed prior to surgery. Artificial nails or gel coating may interfere with anesthesia's ability to adequately monitor your vital signs. ? ?[facility] is not responsible for any belongings or valuables. .  ? ?Do NOT Smoke (Tobacco/Vaping)  24 hours prior to your procedure ? ?If you use a CPAP at night, you may bring your mask for your overnight stay. ?  ?Contacts, glasses, hearing aids, dentures or partials may not be worn into surgery, please bring cases for these belongings ?  ?For patients admitted to the hospital, discharge time will be determined by your treatment team. ?  ?Patients discharged the day of surgery will not be allowed to drive home, and someone needs to stay with them for 24 hours. ? ?NO VISITORS WILL BE ALLOWED IN PRE-OP WHERE PATIENTS ARE PREPPED FOR SURGERY.  ONLY 1 SUPPORT PERSON MAY BE PRESENT IN THE WAITING ROOM WHILE YOU ARE IN SURGERY.  IF YOU ARE TO BE ADMITTED, ONCE YOU ARE IN YOUR ROOM YOU WILL BE ALLOWED TWO (2) VISITORS. 1 (ONE) VISITOR MAY STAY OVERNIGHT BUT MUST ARRIVE TO THE ROOM BY 8pm.  Minor children may have two parents present. Special consideration for safety and  communication needs will be reviewed on a case by case basis. ? ?Special instructions:   ? ?Oral Hygiene is also important to reduce your risk of infection.  Remember - BRUSH YOUR TEETH THE MORNING OF SURGERY WITH YOUR REGULAR TOOTHPASTE ? ? ?[place]- Preparing For Surgery ? ?Before surgery, you can play an important role. Because skin is not sterile, your skin needs to be as free of germs as possible. You can reduce the number of germs on your skin by washing with CHG (chlorahexidine gluconate) Soap before surgery.  CHG is an antiseptic cleaner which kills germs and bonds with the skin to continue killing germs even after washing.   ? ? ?Please do not use if you have an allergy to CHG or antibacterial soaps. If your skin becomes reddened/irritated stop using the CHG.  ?Do not shave (including legs and underarms) for at least  48 hours prior to first CHG shower. It is OK to shave your face. ? ?Please follow these instructions carefully. ?  ? ? Shower the NIGHT BEFORE SURGERY and the MORNING OF SURGERY with CHG Soap.  ? If you chose to wash your hair, wash your hair first as usual with your normal shampoo. After you shampoo, rinse your hair and body thoroughly to remove the shampoo.  Then [employer] and genitals (private parts) with your normal soap and rinse thoroughly to remove soap. ? ?After that Use CHG Soap as you would any other liquid soap. You can apply CHG directly to the skin and wash gently with a scrungie or a clean washcloth.  ? ?Apply the CHG Soap to your body ONLY FROM THE NECK DOWN.  Do not use on open wounds or open sores. Avoid contact with your eyes, ears, mouth and genitals (private parts). Wash Face and genitals (private parts)  with your normal soap.  ? ?Wash thoroughly, paying special attention to the area where your surgery will be performed. ? ?Thoroughly rinse your body with warm water from the neck down. ? ?DO NOT shower/wash with your normal soap after using and rinsing off the CHG  Soap. ? ?Pat yourself dry with a CLEAN TOWEL. ? ?Wear CLEAN PAJAMAS to bed the night before surgery ? ?Place CLEAN SHEETS on your bed the night before your surgery ? ?DO NOT SLEEP WITH PETS. ? ? ?Day of Surgery: ?

## 2021-07-28 ENCOUNTER — Encounter (HOSPITAL_COMMUNITY)
Admission: RE | Admit: 2021-07-28 | Discharge: 2021-07-28 | Disposition: A | Discharge status: Home / Self Care | Payer: Medicare Other | Source: Ambulatory Visit | Attending: Orthopedic Surgery | Admitting: Orthopedic Surgery

## 2021-07-28 ENCOUNTER — Encounter (HOSPITAL_COMMUNITY): Payer: Self-pay

## 2021-07-28 ENCOUNTER — Ambulatory Visit: Payer: Medicare Other | Admitting: Internal Medicine

## 2021-07-28 ENCOUNTER — Encounter: Payer: Self-pay | Admitting: Internal Medicine

## 2021-07-28 ENCOUNTER — Other Ambulatory Visit: Payer: Self-pay

## 2021-07-28 VITALS — BP 140/94 | HR 70 | Temp 98.2°F | Resp 18 | Ht 71.0 in | Wt 213.4 lb

## 2021-07-28 VITALS — Ht 71.0 in | Wt 213.0 lb

## 2021-07-28 DIAGNOSIS — Z01818 Encounter for other preprocedural examination: Secondary | ICD-10-CM

## 2021-07-28 DIAGNOSIS — Z01812 Encounter for preprocedural laboratory examination: Secondary | ICD-10-CM | POA: Insufficient documentation

## 2021-07-28 DIAGNOSIS — Z794 Long term (current) use of insulin: Secondary | ICD-10-CM | POA: Insufficient documentation

## 2021-07-28 DIAGNOSIS — G4733 Obstructive sleep apnea (adult) (pediatric): Secondary | ICD-10-CM | POA: Insufficient documentation

## 2021-07-28 DIAGNOSIS — E1159 Type 2 diabetes mellitus with other circulatory complications: Secondary | ICD-10-CM | POA: Diagnosis not present

## 2021-07-28 DIAGNOSIS — I2510 Atherosclerotic heart disease of native coronary artery without angina pectoris: Secondary | ICD-10-CM | POA: Insufficient documentation

## 2021-07-28 DIAGNOSIS — E785 Hyperlipidemia, unspecified: Secondary | ICD-10-CM | POA: Diagnosis not present

## 2021-07-28 DIAGNOSIS — E119 Type 2 diabetes mellitus without complications: Secondary | ICD-10-CM | POA: Insufficient documentation

## 2021-07-28 DIAGNOSIS — E1165 Type 2 diabetes mellitus with hyperglycemia: Secondary | ICD-10-CM

## 2021-07-28 DIAGNOSIS — Z951 Presence of aortocoronary bypass graft: Secondary | ICD-10-CM | POA: Insufficient documentation

## 2021-07-28 DIAGNOSIS — E1142 Type 2 diabetes mellitus with diabetic polyneuropathy: Secondary | ICD-10-CM | POA: Diagnosis not present

## 2021-07-28 LAB — CBC
HCT: 48.5 % (ref 39.0–52.0)
Hemoglobin: 17.3 g/dL — ABNORMAL HIGH (ref 13.0–17.0)
MCH: 30.8 pg (ref 26.0–34.0)
MCHC: 35.7 g/dL (ref 30.0–36.0)
MCV: 86.3 fL (ref 80.0–100.0)
Platelets: 200 10*3/uL (ref 150–400)
RBC: 5.62 MIL/uL (ref 4.22–5.81)
RDW: 12.3 % (ref 11.5–15.5)
WBC: 7.5 10*3/uL (ref 4.0–10.5)
nRBC: 0 % (ref 0.0–0.2)

## 2021-07-28 LAB — BASIC METABOLIC PANEL
Anion gap: 7 (ref 5–15)
BUN: 14 mg/dL (ref 8–23)
CO2: 26 mmol/L (ref 22–32)
Calcium: 9.4 mg/dL (ref 8.9–10.3)
Chloride: 107 mmol/L (ref 98–111)
Creatinine, Ser: 0.92 mg/dL (ref 0.61–1.24)
GFR, Estimated: >60 mL/min (ref >60)
Glucose, Bld: 120 mg/dL — ABNORMAL HIGH (ref 70–99)
Potassium: 4.2 mmol/L (ref 3.5–5.1)
Sodium: 140 mmol/L (ref 135–145)

## 2021-07-28 LAB — TYPE AND SCREEN
ABO/RH(D): O POS
Antibody Screen: NEGATIVE

## 2021-07-28 LAB — SURGICAL PCR SCREEN
MRSA, PCR: NEGATIVE
Staphylococcus aureus: NEGATIVE

## 2021-07-28 LAB — HEMOGLOBIN A1C
Hgb A1c MFr Bld: 9 % — ABNORMAL HIGH (ref 4.8–5.6)
Mean Plasma Glucose: 211.6 mg/dL

## 2021-07-28 LAB — GLUCOSE, CAPILLARY: Glucose-Capillary: 132 mg/dL — ABNORMAL HIGH (ref 70–99)

## 2021-07-28 NOTE — Progress Notes (Signed)
Surgical Instructions ? ? ? Your procedure is scheduled on Thursday March 23rd. ? Report to [facility] Main Entrance "A" at 5:30 A.M., then check in with the Admitting office. ? Call this number if you have problems the morning of surgery: ? [phone] ? ? If you have any questions prior to your surgery date call [phone]: Open Monday-Friday 8am-4pm ? ? ? Remember: ? Do not eat after midnight the night before your surgery ? ?You may drink clear liquids until 4:30am the morning of your surgery.   ?Clear liquids allowed are: Water, Non-Citrus Juices (without pulp), Carbonated Beverages, Clear Tea, Black Coffee ONLY (NO MILK, CREAM OR POWDERED CREAMER of any kind), and Gatorade ? ? ?Enhanced Recovery after Surgery for Orthopedics ?Enhanced Recovery after Surgery is a protocol used to improve the stress on your body and your recovery after surgery. ? ?Patient Instructions ? ?The night before surgery:  ?No food after midnight. ONLY clear liquids after midnight ? ? ?The day of surgery (if you have diabetes): ?Drink ONE (1) 12 oz G2 given to you in your pre admission testing appointment by 4:30 AM the morning of surgery. Drink in one sitting. Do not sip.  ?This drink was given to you during your hospital  ?pre-op appointment visit.  ?Nothing else to drink after completing the  ?12 oz bottle of G2. ? ?       If you have questions, please contact your surgeon?s office. ? ?  ? Take these medicines the morning of surgery with A SIP OF WATER: ?gabapentin (NEURONTIN) 600 MG tablet ?metoprolol tartrate (LOPRESSOR) 25 MG tablet ?rosuvastatin (CRESTOR) 40 MG tablet ? ?Follow your surgeon's instructions on when to stop Aspirin.  If no instructions were given by your surgeon then you will need to call the office to get those instructions.   ? ?As of today, STOP taking any Aspirin (unless otherwise instructed by your surgeon) Aleve, Meloxicam (Mobic), Naproxen, Ibuprofen, Motrin, Advil, Goody's, BC's, all herbal medications,  fish oil, and all vitamins. ? ?WHAT DO I DO ABOUT MY DIABETES MEDICATION? ? ? ?Do not take oral diabetes medicines the morning of surgery. DO NOT take Metformin (Glucophage) the day of surgery.  ? ?THE DAY BEFORE SURGERY you may take usual dose of insulin Lispro / Humalog before meals. ? ?THE NIGHT BEFORE SURGERY, DO NOT take bedtime dose of Insulin Lispro / Humalog.  ? ?If your CBG is greater than 220 mg/dL, you may take ? of your sliding scale (correction) dose of insulin Lispro (Humalog). ? ? ?HOW TO MANAGE YOUR DIABETES ?BEFORE AND AFTER SURGERY ? ?Why is it important to control my blood sugar before and after surgery? ?Improving blood sugar levels before and after surgery helps healing and can limit problems. ?A way of improving blood sugar control is eating a healthy diet by: ? Eating less sugar and carbohydrates ? Increasing activity/exercise ? Talking with your doctor about reaching your blood sugar goals ?High blood sugars (greater than 180 mg/dL) can raise your risk of infections and slow your recovery, so you will need to focus on controlling your diabetes during the weeks before surgery. ?Make sure that the doctor who takes care of your diabetes knows about your planned surgery including the date and location. ? ?How do I manage my blood sugar before surgery? ?Check your blood sugar at least 4 times a day, starting 2 days before surgery, to make sure that the level is not too high or low. ? ?Check your blood sugar  the morning of your surgery when you wake up and every 2 hours until you get to the Short Stay unit. ? ?If your blood sugar is less than 70 mg/dL, you will need to treat for low blood sugar: ?Do not take insulin. ?Treat a low blood sugar (less than 70 mg/dL) with ? cup of clear juice (cranberry or apple), 4 glucose tablets, OR glucose gel. ?Recheck blood sugar in 15 minutes after treatment (to make sure it is greater than 70 mg/dL). If your blood sugar is not greater than 70 mg/dL on recheck,  call [phone] for further instructions. ?Report your blood sugar to the short stay nurse when you get to Short Stay. ? ?If you are admitted to the hospital after surgery: ?Your blood sugar will be checked by the staff and you will probably be given insulin after surgery (instead of oral diabetes medicines) to make sure you have good blood sugar levels. ?The goal for blood sugar control after surgery is 80-180 mg/dL. ? ? ?         ?Do not wear jewelry  ?Do not wear lotions, powders, colognes, or deodorant. ?Do not shave 48 hours prior to surgery.  Men may shave face and neck. ?Do not bring valuables to the hospital. ?Do not wear nail polish, gel polish, artificial nails, or any other type of covering on natural nails (fingers and toes) ?If you have artificial nails or gel coating that need to be removed by a nail salon, please have this removed prior to surgery. Artificial nails or gel coating may interfere with anesthesia's ability to adequately monitor your vital signs. ? ?[facility] is not responsible for any belongings or valuables. .  ? ?Do NOT Smoke (Tobacco/Vaping)  24 hours prior to your procedure ? ?If you use a CPAP at night, you may bring your mask for your overnight stay. ?  ?Contacts, glasses, hearing aids, dentures or partials may not be worn into surgery, please bring cases for these belongings ?  ?For patients admitted to the hospital, discharge time will be determined by your treatment team. ?  ?Patients discharged the day of surgery will not be allowed to drive home, and someone needs to stay with them for 24 hours. ? ?NO VISITORS WILL BE ALLOWED IN PRE-OP WHERE PATIENTS ARE PREPPED FOR SURGERY.  ONLY 1 SUPPORT PERSON MAY BE PRESENT IN THE WAITING ROOM WHILE YOU ARE IN SURGERY.  IF YOU ARE TO BE ADMITTED, ONCE YOU ARE IN YOUR ROOM YOU WILL BE ALLOWED TWO (2) VISITORS. 1 (ONE) VISITOR MAY STAY OVERNIGHT BUT MUST ARRIVE TO THE ROOM BY 8pm.  Minor children may have two parents present. Special  consideration for safety and communication needs will be reviewed on a case by case basis. ? ?Special instructions:   ? ?Oral Hygiene is also important to reduce your risk of infection.  Remember - BRUSH YOUR TEETH THE MORNING OF SURGERY WITH YOUR REGULAR TOOTHPASTE ? ? ?[place]- Preparing For Surgery ? ?Before surgery, you can play an important role. Because skin is not sterile, your skin needs to be as free of germs as possible. You can reduce the number of germs on your skin by washing with CHG (chlorahexidine gluconate) Soap before surgery.  CHG is an antiseptic cleaner which kills germs and bonds with the skin to continue killing germs even after washing.   ? ? ?Please do not use if you have an allergy to CHG or antibacterial soaps. If your skin becomes reddened/irritated stop  using the CHG.  ?Do not shave (including legs and underarms) for at least 48 hours prior to first CHG shower. It is OK to shave your face. ? ?Please follow these instructions carefully. ?  ? ? Shower the NIGHT BEFORE SURGERY and the MORNING OF SURGERY with CHG Soap.  ? If you chose to wash your hair, wash your hair first as usual with your normal shampoo. After you shampoo, rinse your hair and body thoroughly to remove the shampoo.  Then [employer] and genitals (private parts) with your normal soap and rinse thoroughly to remove soap. ? ?After that Use CHG Soap as you would any other liquid soap. You can apply CHG directly to the skin and wash gently with a scrungie or a clean washcloth.  ? ?Apply the CHG Soap to your body ONLY FROM THE NECK DOWN.  Do not use on open wounds or open sores. Avoid contact with your eyes, ears, mouth and genitals (private parts). Wash Face and genitals (private parts)  with your normal soap.  ? ?Wash thoroughly, paying special attention to the area where your surgery will be performed. ? ?Thoroughly rinse your body with warm water from the neck down. ? ?DO NOT shower/wash with your normal soap after using  and rinsing off the CHG Soap. ? ?Pat yourself dry with a CLEAN TOWEL. ? ?Wear CLEAN PAJAMAS to bed the night before surgery ? ?Place CLEAN SHEETS on your bed the night before your surgery ? ?DO NOT SLEEP WIT

## 2021-07-28 NOTE — Progress Notes (Signed)
?Name: [name] [name]  ?Age/ Sex: 61 y.o., male   ?MRN/ DOB: [id number], 1960/08/08    ? ?PCP: Leonard Downing, MD   ?Reason for Endocrinology Evaluation: Type 2 Diabetes Mellitus  ?Initial Endocrine Consultative Visit: 10/31/2019  ? ? ?PATIENT IDENTIFIER: [name] is a 61 y.o. male with a past medical history of T2Dm, [name], HTN, Dyslipidemia and CAD (S/P stents 2005 and CABG 2018). The patient has followed with Endocrinology clinic since 10/31/2019 for consultative assistance with management of his diabetes. ? ?DIABETIC HISTORY:  ?[name] [name] was diagnosed with DM in 2015. Metformin caused massive diarrhea . His hemoglobin A1c has ranged from 8.9% in 2016, peaking at 13.7% in 2015. ? ?On disability for heart and back issues  ? ? ?On his initial visit to our clinic he had an A1c 13.9 % . He was not on any medications by that time, we started MDI regimen.  ? ? ?Attempted to restart metformin XR but stopped by 05/2020 due to diarrhea  ? ?GAD -65 and Iselt cell antibody levels were undetectable.  ? ?Sister with T1DM  ? ?[place] cost prohibitive  ? ?SUBJECTIVE:  ? ?During the last visit (03/25/2021): A1c 9.6 % , We started Farxiga, increased metformin and adjusted MDi regimen.  ? ? ?Today (07/28/2021): [name] [name] is here for a follow up on diabetes management.  He checks his blood sugars multiple times a day through Dexcom. The patient has not had hypoglycemic episodes since the last clinic visit. ? ?Pending back sx 07/31/2021 ?He has not refilled Lantus in a long time. He takes Novolog 20 units as needed  ? ?Denies nausea, vomiting or diarrhea  ? ?HOME DIABETES REGIMEN:  ?Metformin 500 mg XR , 2 tabs QAM and 1 QPM  ?Lantus 40 units daily - not taking  ?Humalog 22 units with each meal  ?CF : Humalog (BG -130/25) - not taking  ? ? ? ? ?Statin: Yes ?ACE-I/ARB: was prescribed but not taken ? ? ? ? ?CONTINUOUS GLUCOSE MONITORING RECORD INTERPRETATION : ?   ? ?Dates of Recording: 3/7-3/20/2023 ? ?Sensor  description:dexcom  ? ?Results statistics: ?  ?CGM use % of time 100  ?Average and SD 244/69  ?Time in range  17  %  ?% Time Above 180 34  ?% Time above 250 48  ?% Time Below target <   ? ?Glycemic patterns summary: Hyperglycemia noted during the day and night ? ?Hyperglycemic episodes   all day and night  ? ?Hypoglycemic episodes occurred n/a ? ?Overnight periods: high ? ? ? ? ? ? ?DIABETIC COMPLICATIONS: ?Microvascular complications:  ?Mild retinopathy , neuropathy  ?Denies: CKD ?Last eye exam: Completed 08/2020 ?  ? ? ? ?Macrovascular complications:  ?CAD (S/p CABG 2018) ?Denies: PVD, CVA ? ? ? ?HISTORY:  ?Past Medical History:  ?Past Medical History:  ?Diagnosis Date  ? Anxiety   ? Chronic lower back pain   ? Coronary artery disease 03/2004  ? 3 stents put in   ? Depression   ? Fibromyalgia   ? GERD (gastroesophageal reflux disease)   ? pt denies d/t weight loss  ? History of hiatal hernia   ? states no longer there per endoscopy  ? Hyperlipidemia LDL goal <70   ? Hypertension   ? Left leg pain   ? Low back pain   ? Migraine   ? "weekly; back in the 1990s" (11/12/2016)  ? Myocardial infarction [facility]) 2005  ? Neuroforaminal stenosis of lumbar spine   ?  L5-S1  ? [name] (obstructive sleep apnea)   ? "quit wearing my mask" (11/12/2016)  ? Spondylolisthesis, grade 2   ? Type II diabetes mellitus ([place]) 2010  ? ?Past Surgical History:  ?Past Surgical History:  ?Procedure Laterality Date  ? CARDIAC CATHETERIZATION  11/12/2016  ? CORONARY ANGIOPLASTY WITH STENT PLACEMENT  03/2004   ? x 3 stents  ? CORONARY ARTERY BYPASS GRAFT N/A 11/17/2016  ? Procedure: CORONARY ARTERY BYPASS GRAFTING (CABG) x 5, USING RIGHT INTERNAL MAMMARY ARTERY AND RIGHT GREATER SAPHENOUS VEIN HARVESTED ENDOSCOPICALLY;  Surgeon: Ivin Poot, MD;  Location: [facility];  Service: Open Heart Surgery;  Laterality: N/A;  ? INGUINAL HERNIA REPAIR Right 2000  ? LEFT HEART CATH AND CORONARY ANGIOGRAPHY N/A 11/12/2016  ? Procedure: Left Heart Cath and Coronary  Angiography;  Surgeon: Lorretta Harp, MD;  Location: [facility];  Service: Cardiovascular;  Laterality: N/A;  ? TEE WITHOUT CARDIOVERSION N/A 11/17/2016  ? Procedure: TRANSESOPHAGEAL ECHOCARDIOGRAM (TEE);  Surgeon: Prescott Gum, Collier Salina, MD;  Location: [facility];  Service: Open Heart Surgery;  Laterality: N/A;  ? ?Social History:  reports that he has never smoked. He has never used smokeless tobacco. He reports current alcohol use of about 2.0 standard drinks per week. He reports current drug use. Drug: Marijuana. ?Family History:  ?Family History  ?Problem Relation Age of Onset  ? Diabetes Father   ? Diabetes Sister   ? Diabetes Paternal Grandfather   ? Cancer Neg Hx   ? ? ? ?HOME MEDICATIONS: ?Allergies as of 07/28/2021   ?No Known Allergies ?  ? ?  ?Medication List  ?  ? ?  ? Accurate as of July 28, 2021 12:38 PM. If you have any questions, ask your nurse or doctor.  ?  ?  ? ?  ? ?amitriptyline 25 MG tablet ?Commonly known as: ELAVIL ?Take 25 mg by mouth at bedtime. ?  ?aspirin EC 81 MG tablet ?Take 81 mg by mouth daily. ?  ?Dexcom G6 Transmitter Misc ?Change every 10 days as needed/directed ?  ?gabapentin 600 MG tablet ?Commonly known as: NEURONTIN ?Take 1,200 mg by mouth every 12 (twelve) hours. ?  ?HumaLOG KwikPen 200 UNIT/ML KwikPen ?Generic drug: insulin lispro ?Inject 20 Units into the skin 5 (five) times daily as needed (blood sugar over 200). ?  ?insulin lispro 100 UNIT/ML injection ?Commonly known as: HUMALOG ?MAX DAILY 100 UNITS, this is correct dose in vials and not pens ?  ?Insulin Pen Needle 29G X 5MM Misc ?1 Device by Does not apply route in the morning, at noon, in the evening, and at bedtime. ?  ?lisinopril 5 MG tablet ?Commonly known as: ZESTRIL ?Take 10 mg by mouth daily. ?  ?meloxicam 15 MG tablet ?Commonly known as: MOBIC ?Take 15 mg by mouth daily. ?  ?metFORMIN 500 MG 24 hr tablet ?Commonly known as: GLUCOPHAGE-XR ?Take 500-1,000 mg by mouth See admin instructions. Take 1000 mg in the  morning and 500 mg in the evening ?  ?metoprolol tartrate 25 MG tablet ?Commonly known as: LOPRESSOR ?Take 1 tablet (25 mg total) by mouth 2 (two) times daily. ?  ?OneTouch Delica Lancets 99991111 Misc ?1 each by Does not apply route in the morning, at noon, in the evening, and at bedtime. Use Onetouch Delica lancets to check blood sugar 3-4 times daily. ?  ?OneTouch Ultra test strip ?Generic drug: glucose blood ?1 EACH BY OTHER ROUTE 3 (THREE) TIMES DAILY. USE ONETOUCH ULTRA TEST STRIPS AS INSTRUCTED TO  CHECK BLOOD SUGAR 3-4 TIMES DAILY. ?  ?rosuvastatin 40 MG tablet ?Commonly known as: CRESTOR ?Take 1 tablet (40 mg total) by mouth daily. ?  ? ?  ? ? ? ?OBJECTIVE:  ? ?Vital Signs: Ht 5\' 11"  (1.803 m)   Wt 213 lb (96.6 kg)   BMI 29.71 kg/m?   ?Wt Readings from Last 3 Encounters:  ?07/28/21 213 lb (96.6 kg)  ?07/28/21 213 lb 6.4 oz (96.8 kg)  ?07/22/21 215 lb (97.5 kg)  ? ? ?Exam: ?General: Pt appears well and is in NAD  ?Lungs: Clear with good BS bilat with no rales, rhonchi, or wheezes  ?Heart: RRR   ?Extremities: No pretibial edema.   ?Neuro: MS is good with appropriate affect, pt is alert and Ox3  ? ? ?DM foot exam: 03/25/2021 ?   ? ?The skin of the feet is intact without sores or ulcerations. ?The pedal pulses are 1+ on right and 1+ on left. ?The sensation is decreased to a screening 5.07, 10 gram monofilament bilaterally at the toes  ? ? ? ?DATA REVIEWED: ? ?Lab Results  ?Component Value Date  ? HGBA1C 9.6 (A) 03/25/2021  ? HGBA1C 8.4 (A) 09/10/2020  ? HGBA1C 8.9 (A) 05/14/2020  ? ?Lab Results  ?Component Value Date  ? MICROALBUR 2.7 (H) 10/31/2019  ? [place] 53 11/28/2020  ? CREATININE 1.01 11/28/2020  ? ?Lab Results  ?Component Value Date  ? MICRALBCREAT 6.0 10/31/2019  ? ? ? ?Lab Results  ?Component Value Date  ? CHOL 123 11/28/2020  ? HDL 30 (L) 11/28/2020  ? [place] 53 11/28/2020  ? TRIG 252 (H) 11/28/2020  ? CHOLHDL 4.1 11/28/2020  ?     ? ? ?ASSESSMENT / PLAN / RECOMMENDATIONS:  ? ?1) Type 2 Diabetes  Mellitus, Poorly controlled, With Neuropathic and Macrovascular complications - Most recent A1c of 9.6 % %. Goal A1c < 7.0 %.   ? ?- Pt with worsening glycemic control , due to medication non-adherence . He stopped using Lantus an

## 2021-07-28 NOTE — Patient Instructions (Addendum)
-   Restart Lantus 40 units daily  ?- Continue Humalog  20 units with each meal  ? ?- Humalog correctional insulin: ADD extra units on insulin to your meal-time Humalog dose if your blood sugars are higher than 150. Use the scale below to help guide you:  ? ?Blood sugar before meal Number of units to inject  ?Less than 145 0 unit  ?146 - 160 1 units  ?161 - 175 2 units  ?176 - 190 3 units  ?191 - 205 4 units  ?206 - 220 5 units  ?221 - 235 6 units  ?236 - 250 7 units  ?251 - 265 8 units  ?266 - 280 9 units   ?281 - 295 10 units   ? ? ?Check out the [place]  ? ? ? ? ? ?HOW TO TREAT LOW BLOOD SUGARS (Blood sugar LESS THAN 70 MG/DL) ?Please follow the RULE OF 15 for the treatment of hypoglycemia treatment (when your (blood sugars are less than 70 mg/dL)  ? ?STEP 1: Take 15 grams of carbohydrates when your blood sugar is low, which includes:  ?3-4 GLUCOSE TABS  OR ?3-4 OZ OF JUICE OR REGULAR SODA OR ?ONE TUBE OF GLUCOSE GEL   ? ?STEP 2: RECHECK blood sugar in 15 MINUTES ?STEP 3: If your blood sugar is still low at the 15 minute recheck --> then, go back to STEP 1 and treat AGAIN with another 15 grams of carbohydrates. ? ?

## 2021-07-28 NOTE — Progress Notes (Signed)
PCP - Windle Guard ?Cardiologist - Dr. Dulce Sellar- clearance note from [place], [place] on 07/22/21 ?Endocrinologist- Dr. Lonzo Raegyn Renda- pt to see on 07/29/21 ? ?Chest x-ray - N/A ?EKG - 11/28/20 ?Stress Test - pt reports stress test prior to CABG ?ECHO - 11/17/16 ?Cardiac Cath - 11/12/16 ? ?Sleep Study - pt reports sleep study but unsure when and where this was done. Pt does not currently use CPAP and reports he no longer needs this d/t having lost weight  ? ?Fasting Blood Sugar - CBG 132 in PAT appointment. Pt uses Dexcom G6 continuous monitor. Pt reports his blood sugar usually stays around 200. Pt to see Endocrinologist tomorrow. Hgb A1c drawn in PAT today.  ? ?BP 140/94 in PAT- pt reports he did take his BP medicine today and states that he recently had his medication doses increased by MD.  ? ?Aspirin Instructions: Pt states that his last dose of Aspirin was 07/25/21 per surgeon's instructions. Pt states he has stopped taking Meloxicam already as well.  ? ?ERAS Protcol - ordered- G2 given to patient for day of surgery.  ? ?COVID TEST- Pt reports positive home test on 06/29/21 after traveling on cruise to [facility] and [place] Pt denies any current symptoms of exposures since then.  ? ?Anesthesia review: Cardiac history ? ?Patient denies shortness of breath, fever, cough and chest pain at PAT appointment ? ? ?All instructions explained to the patient, with a verbal understanding of the material. Patient agrees to go over the instructions while at home for a better understanding. The opportunity to ask questions was provided. ? ? ?

## 2021-07-29 ENCOUNTER — Ambulatory Visit: Payer: Medicare Other | Admitting: Internal Medicine

## 2021-07-29 NOTE — Anesthesia Preprocedure Evaluation (Addendum)
Anesthesia Evaluation  ?Patient identified by MRN, date of birth, ID band ?Patient awake ? ? ? ?Reviewed: ?Allergy & Precautions, NPO status , Patient's Chart, lab work & pertinent test results ? ?Airway ?Mallampati: II ? ?TM Distance: >3 FB ?Neck ROM: Full ? ? ? Dental ? ?(+) Poor Dentition ?Many broken/chipped teeth:   ?Pulmonary ?sleep apnea ,  ?  ?Pulmonary exam normal ?breath sounds clear to auscultation ? ? ? ? ? ? Cardiovascular ?hypertension, + angina + CAD and + Past MI  ?Normal cardiovascular exam ?Rhythm:Regular Rate:Normal ? ? ?  ?Neuro/Psych ? Headaches, PSYCHIATRIC DISORDERS Anxiety Depression  Neuromuscular disease (radiculopathy)   ? GI/Hepatic ?hiatal hernia, GERD  ,  ?Endo/Other  ?diabetes ? Renal/GU ?  ?negative genitourinary ?  ?Musculoskeletal ? ?(+) Fibromyalgia - ? Abdominal ?  ?Peds ? Hematology ?  ?Anesthesia Other Findings ? ? Reproductive/Obstetrics ? ?  ? ? ? ? ? ? ? ? ? ? ? ? ? ?  ?  ? ? ? ? ? ? ? ?Anesthesia Physical ?Anesthesia Plan ? ?ASA: 3 ? ?Anesthesia Plan: General  ? ?Post-op Pain Management: Tylenol PO (pre-op)*  ? ?Induction: Intravenous ? ?PONV Risk Score and Plan: 2 and Treatment may vary due to age or medical condition ? ?Airway Management Planned:  ? ?Additional Equipment: Arterial line ? ?Intra-op Plan:  ? ?Post-operative Plan: Extubation in OR ? ?Informed Consent: I have reviewed the patients History and Physical, chart, labs and discussed the procedure including the risks, benefits and alternatives for the proposed anesthesia with the patient or authorized representative who has indicated his/her understanding and acceptance.  ? ? ? ?Dental advisory given ? ?Plan Discussed with: CRNA, Anesthesiologist and Surgeon ? ?Anesthesia Plan Comments: (PAT note by Antionette Poles, PA-C: ?Follows with cardiology for history of CAD s/p stents 2005 and CABG 2018.  Last seen 07/16/2021 for preop clearance.  Per note, "Given past medical history and time  since last visit, based on ACC/AHA guidelines,?Clements Toro Fanelli?would be at acceptable risk for the planned procedure without further cardiovascular testing. The patient was advised that if?he?develops new symptoms prior to surgery to contact our office to arrange for a follow-up visit, and he?verbalized understanding." ? ?History of [name] on CPAP.  Patient stated he discontinued CPAP use after losing weight. ? ?History of poorly controlled insulin-dependent DM2.  Preop A1c 9.0.  History of medication nonadherence.  Last seen by endocrinologist Dr. Lonzo Cloud and medication regimen was adjusted. ? ?Preop labs reviewed, unremarkable. ? ?EKG 11/28/2020: NSR.  Rate 71.  LAD.  Inferior infarct, age undetermined. ? ?TEE 11/17/2016: ?? Left ventricle: Normal cavity size and left ventricular diastolic  ?function. LV systolic function is low normal with an EF of 50-55%. Wall  ?motion is abnormal. Inferior wall slight RWMA with scar Inferior wall  ?motion is mildly hypokinetic. No thrombus present. No mass present.  ?? Mitral valve: Mild regurgitation.  ?? Right ventricle: Normal cavity size, wall thickness and ejection  ?fraction. ? ?)  ? ? ? ? ?Anesthesia Quick Evaluation ? ?

## 2021-07-29 NOTE — Progress Notes (Signed)
Anesthesia Chart Review: ? ?Follows with cardiology for history of CAD s/p stents 2005 and CABG 2018.  Last seen 07/16/2021 for preop clearance.  Per note, "Given past medical history and time since last visit, based on ACC/AHA guidelines, MARQUIES MIMMS would be at acceptable risk for the planned procedure without further cardiovascular testing. The patient was advised that if he develops new symptoms prior to surgery to contact our office to arrange for a follow-up visit, and he verbalized understanding." ? ?History of [name] on CPAP.  Patient stated he discontinued CPAP use after losing weight. ? ?History of poorly controlled insulin-dependent DM2.  Preop A1c 9.0.  History of medication nonadherence.  Last seen by endocrinologist Dr. Kelton Pillar and medication regimen was adjusted. ? ?Preop labs reviewed, unremarkable. ? ?EKG 11/28/2020: NSR.  Rate 71.  LAD.  Inferior infarct, age undetermined. ? ?TEE 11/17/2016: ?? Left ventricle: Normal cavity size and left ventricular diastolic  ?function. LV systolic function is low normal with an EF of 50-55%. Wall  ?motion is abnormal. Inferior wall slight RWMA with scar Inferior wall  ?motion is mildly hypokinetic. No thrombus present. No mass present.  ?? Mitral valve: Mild regurgitation.  ?? Right ventricle: Normal cavity size, wall thickness and ejection  ?fraction. ? ? ?Karoline Caldwell, PA-C ?[facility] Short Stay Center/Anesthesiology ?Phone [phone] ?07/29/2021 2:30 PM ? ?

## 2021-07-31 ENCOUNTER — Inpatient Hospital Stay (HOSPITAL_COMMUNITY): Payer: Medicare Other | Admitting: Physician Assistant

## 2021-07-31 ENCOUNTER — Inpatient Hospital Stay (HOSPITAL_COMMUNITY): Payer: Medicare Other

## 2021-07-31 ENCOUNTER — Encounter (HOSPITAL_COMMUNITY): Payer: Self-pay | Admitting: Orthopedic Surgery

## 2021-07-31 ENCOUNTER — Inpatient Hospital Stay (HOSPITAL_COMMUNITY)
Admission: RE | Disposition: A | Discharge status: Home / Self Care | Payer: Self-pay | Source: Home / Self Care | Attending: Orthopedic Surgery

## 2021-07-31 ENCOUNTER — Other Ambulatory Visit: Payer: Self-pay

## 2021-07-31 ENCOUNTER — Inpatient Hospital Stay (HOSPITAL_COMMUNITY): Payer: Medicare Other | Admitting: Certified Registered Nurse Anesthetist

## 2021-07-31 ENCOUNTER — Inpatient Hospital Stay (HOSPITAL_COMMUNITY)
Admission: RE | Admit: 2021-07-31 | Discharge: 2021-08-01 | DRG: 455 | Disposition: A | Discharge status: Home / Self Care | Payer: Medicare Other | Attending: Orthopedic Surgery | Admitting: Orthopedic Surgery

## 2021-07-31 DIAGNOSIS — Z951 Presence of aortocoronary bypass graft: Secondary | ICD-10-CM

## 2021-07-31 DIAGNOSIS — M797 Fibromyalgia: Secondary | ICD-10-CM | POA: Diagnosis present

## 2021-07-31 DIAGNOSIS — Z794 Long term (current) use of insulin: Secondary | ICD-10-CM

## 2021-07-31 DIAGNOSIS — K219 Gastro-esophageal reflux disease without esophagitis: Secondary | ICD-10-CM | POA: Diagnosis present

## 2021-07-31 DIAGNOSIS — M5410 Radiculopathy, site unspecified: Principal | ICD-10-CM | POA: Diagnosis present

## 2021-07-31 DIAGNOSIS — M4807 Spinal stenosis, lumbosacral region: Secondary | ICD-10-CM | POA: Diagnosis present

## 2021-07-31 DIAGNOSIS — Z7982 Long term (current) use of aspirin: Secondary | ICD-10-CM

## 2021-07-31 DIAGNOSIS — M4317 Spondylolisthesis, lumbosacral region: Principal | ICD-10-CM | POA: Diagnosis present

## 2021-07-31 DIAGNOSIS — M5417 Radiculopathy, lumbosacral region: Secondary | ICD-10-CM | POA: Diagnosis present

## 2021-07-31 DIAGNOSIS — I252 Old myocardial infarction: Secondary | ICD-10-CM | POA: Diagnosis not present

## 2021-07-31 DIAGNOSIS — E785 Hyperlipidemia, unspecified: Secondary | ICD-10-CM | POA: Diagnosis present

## 2021-07-31 DIAGNOSIS — I10 Essential (primary) hypertension: Secondary | ICD-10-CM | POA: Diagnosis present

## 2021-07-31 DIAGNOSIS — E119 Type 2 diabetes mellitus without complications: Secondary | ICD-10-CM | POA: Diagnosis present

## 2021-07-31 DIAGNOSIS — M4316 Spondylolisthesis, lumbar region: Secondary | ICD-10-CM | POA: Diagnosis not present

## 2021-07-31 DIAGNOSIS — Z955 Presence of coronary angioplasty implant and graft: Secondary | ICD-10-CM

## 2021-07-31 DIAGNOSIS — Z7984 Long term (current) use of oral hypoglycemic drugs: Secondary | ICD-10-CM | POA: Diagnosis not present

## 2021-07-31 DIAGNOSIS — I2510 Atherosclerotic heart disease of native coronary artery without angina pectoris: Secondary | ICD-10-CM | POA: Diagnosis present

## 2021-07-31 DIAGNOSIS — M5416 Radiculopathy, lumbar region: Principal | ICD-10-CM

## 2021-07-31 DIAGNOSIS — Z9114 Patient's other noncompliance with medication regimen: Secondary | ICD-10-CM

## 2021-07-31 DIAGNOSIS — Z79899 Other long term (current) drug therapy: Secondary | ICD-10-CM

## 2021-07-31 DIAGNOSIS — Z833 Family history of diabetes mellitus: Secondary | ICD-10-CM

## 2021-07-31 DIAGNOSIS — I25119 Atherosclerotic heart disease of native coronary artery with unspecified angina pectoris: Secondary | ICD-10-CM

## 2021-07-31 DIAGNOSIS — F32A Depression, unspecified: Secondary | ICD-10-CM | POA: Diagnosis present

## 2021-07-31 HISTORY — PX: ABDOMINAL EXPOSURE: SHX5708

## 2021-07-31 HISTORY — PX: ANTERIOR LUMBAR FUSION: SHX1170

## 2021-07-31 LAB — GLUCOSE, CAPILLARY
Glucose-Capillary: 146 mg/dL — ABNORMAL HIGH (ref 70–99)
Glucose-Capillary: 173 mg/dL — ABNORMAL HIGH (ref 70–99)
Glucose-Capillary: 224 mg/dL — ABNORMAL HIGH (ref 70–99)
Glucose-Capillary: 187 mg/dL — ABNORMAL HIGH (ref 70–99)
Glucose-Capillary: 160 mg/dL — ABNORMAL HIGH (ref 70–99)
Glucose-Capillary: 271 mg/dL — ABNORMAL HIGH (ref 70–99)

## 2021-07-31 SURGERY — ANTERIOR LUMBAR FUSION 1 LEVEL
Anesthesia: General

## 2021-07-31 MED ORDER — HYDROCODONE-ACETAMINOPHEN 5-325 MG PO TABS: 1.0000 | ORAL_TABLET | ORAL | Status: DC | PRN

## 2021-07-31 MED ORDER — DEXMEDETOMIDINE (PRECEDEX) IN NS 20 MCG/5ML (4 MCG/ML) IV SYRINGE
PREFILLED_SYRINGE | INTRAVENOUS | Status: DC | PRN
  Administered 2021-07-31: 20 ug via INTRAVENOUS

## 2021-07-31 MED ORDER — DEXAMETHASONE SODIUM PHOSPHATE 10 MG/ML IJ SOLN
INTRAMUSCULAR | Status: AC
  Filled 2021-07-31: qty 1

## 2021-07-31 MED ORDER — SODIUM CHLORIDE 0.9% FLUSH: 3.0000 mL | INTRAVENOUS | Status: DC | PRN

## 2021-07-31 MED ORDER — AMITRIPTYLINE HCL 25 MG PO TABS
25.0000 mg | ORAL_TABLET | Freq: Every day | ORAL | Status: DC
  Administered 2021-07-31: 25 mg via ORAL
  Filled 2021-07-31 (×2): qty 1

## 2021-07-31 MED ORDER — OXYCODONE HCL 5 MG/5ML PO SOLN: 5.0000 mg | Freq: Once | ORAL | Status: DC | PRN

## 2021-07-31 MED ORDER — BUPIVACAINE-EPINEPHRINE (PF) 0.25% -1:200000 IJ SOLN
INTRAMUSCULAR | Status: AC
  Filled 2021-07-31: qty 30

## 2021-07-31 MED ORDER — SODIUM CHLORIDE 0.9% FLUSH
3.0000 mL | Freq: Two times a day (BID) | INTRAVENOUS | Status: DC
  Administered 2021-07-31: 3 mL via INTRAVENOUS

## 2021-07-31 MED ORDER — POVIDONE-IODINE 7.5 % EX SOLN
Freq: Once | CUTANEOUS | Status: DC
  Filled 2021-07-31: qty 118

## 2021-07-31 MED ORDER — HYDROMORPHONE HCL 1 MG/ML IJ SOLN
INTRAMUSCULAR | Status: AC
  Filled 2021-07-31: qty 1

## 2021-07-31 MED ORDER — HYDROMORPHONE HCL 1 MG/ML IJ SOLN
0.2500 mg | INTRAMUSCULAR | Status: DC | PRN
  Administered 2021-07-31 (×4): 0.5 mg via INTRAVENOUS

## 2021-07-31 MED ORDER — CEFAZOLIN SODIUM-DEXTROSE 2-4 GM/100ML-% IV SOLN
2.0000 g | Freq: Three times a day (TID) | INTRAVENOUS | Status: AC
  Administered 2021-07-31 – 2021-08-01 (×2): 2 g via INTRAVENOUS
  Filled 2021-07-31 (×2): qty 100

## 2021-07-31 MED ORDER — INSULIN ASPART 100 UNIT/ML IJ SOLN
0.0000 [IU] | INTRAMUSCULAR | Status: AC | PRN
  Administered 2021-07-31 (×2): 2 [IU] via SUBCUTANEOUS
  Administered 2021-07-31: 4 [IU] via SUBCUTANEOUS
  Filled 2021-07-31: qty 1

## 2021-07-31 MED ORDER — GABAPENTIN 600 MG PO TABS
1200.0000 mg | ORAL_TABLET | Freq: Two times a day (BID) | ORAL | Status: DC
  Administered 2021-07-31 – 2021-08-01 (×2): 1200 mg via ORAL
  Filled 2021-07-31 (×2): qty 2

## 2021-07-31 MED ORDER — BUPIVACAINE-EPINEPHRINE 0.25% -1:200000 IJ SOLN
INTRAMUSCULAR | Status: DC | PRN
  Administered 2021-07-31: 20 mL
  Administered 2021-07-31: 8 mL

## 2021-07-31 MED ORDER — METFORMIN HCL ER 500 MG PO TB24
500.0000 mg | ORAL_TABLET | Freq: Every day | ORAL | Status: DC
  Administered 2021-07-31: 500 mg via ORAL
  Filled 2021-07-31: qty 1

## 2021-07-31 MED ORDER — FENTANYL CITRATE (PF) 250 MCG/5ML IJ SOLN
INTRAMUSCULAR | Status: DC | PRN
  Administered 2021-07-31 (×5): 50 ug via INTRAVENOUS

## 2021-07-31 MED ORDER — LIDOCAINE 2% (20 MG/ML) 5 ML SYRINGE
INTRAMUSCULAR | Status: AC
  Filled 2021-07-31: qty 5

## 2021-07-31 MED ORDER — ONDANSETRON HCL 4 MG/2ML IJ SOLN: 4.0000 mg | Freq: Once | INTRAMUSCULAR | Status: DC | PRN

## 2021-07-31 MED ORDER — CEFAZOLIN SODIUM-DEXTROSE 2-4 GM/100ML-% IV SOLN
2.0000 g | INTRAVENOUS | Status: AC
  Administered 2021-07-31 (×2): 2 g via INTRAVENOUS
  Filled 2021-07-31: qty 100

## 2021-07-31 MED ORDER — FLEET ENEMA 7-19 GM/118ML RE ENEM: 1.0000 | ENEMA | Freq: Once | RECTAL | Status: DC | PRN

## 2021-07-31 MED ORDER — METHOCARBAMOL 500 MG PO TABS
500.0000 mg | ORAL_TABLET | Freq: Four times a day (QID) | ORAL | Status: DC | PRN
  Administered 2021-07-31 – 2021-08-01 (×3): 500 mg via ORAL
  Filled 2021-07-31 (×3): qty 1

## 2021-07-31 MED ORDER — SENNOSIDES-DOCUSATE SODIUM 8.6-50 MG PO TABS: 1.0000 | ORAL_TABLET | Freq: Every evening | ORAL | Status: DC | PRN

## 2021-07-31 MED ORDER — THROMBIN (RECOMBINANT) 20000 UNITS EX SOLR
CUTANEOUS | Status: AC
  Filled 2021-07-31: qty 20000

## 2021-07-31 MED ORDER — DOCUSATE SODIUM 100 MG PO CAPS
100.0000 mg | ORAL_CAPSULE | Freq: Two times a day (BID) | ORAL | Status: DC
  Administered 2021-07-31 – 2021-08-01 (×2): 100 mg via ORAL
  Filled 2021-07-31 (×2): qty 1

## 2021-07-31 MED ORDER — LIDOCAINE 2% (20 MG/ML) 5 ML SYRINGE
INTRAMUSCULAR | Status: DC | PRN
  Administered 2021-07-31: 60 mg via INTRAVENOUS

## 2021-07-31 MED ORDER — ONDANSETRON HCL 4 MG PO TABS: 4.0000 mg | ORAL_TABLET | Freq: Four times a day (QID) | ORAL | Status: DC | PRN

## 2021-07-31 MED ORDER — CHLORHEXIDINE GLUCONATE 0.12 % MT SOLN
15.0000 mL | Freq: Once | OROMUCOSAL | Status: AC
  Administered 2021-07-31: 15 mL via OROMUCOSAL
  Filled 2021-07-31: qty 15

## 2021-07-31 MED ORDER — ONDANSETRON HCL 4 MG/2ML IJ SOLN
INTRAMUSCULAR | Status: AC
  Filled 2021-07-31: qty 2

## 2021-07-31 MED ORDER — AMISULPRIDE (ANTIEMETIC) 5 MG/2ML IV SOLN: 10.0000 mg | Freq: Once | INTRAVENOUS | Status: DC | PRN

## 2021-07-31 MED ORDER — PROPOFOL 10 MG/ML IV BOLUS
INTRAVENOUS | Status: DC | PRN
  Administered 2021-07-31: 150 mg via INTRAVENOUS

## 2021-07-31 MED ORDER — HEMOSTATIC AGENTS (NO CHARGE) OPTIME
TOPICAL | Status: DC | PRN
  Administered 2021-07-31: 1

## 2021-07-31 MED ORDER — METHOCARBAMOL 1000 MG/10ML IJ SOLN
500.0000 mg | Freq: Four times a day (QID) | INTRAVENOUS | Status: DC | PRN
  Filled 2021-07-31: qty 5

## 2021-07-31 MED ORDER — LISINOPRIL 10 MG PO TABS
10.0000 mg | ORAL_TABLET | Freq: Every day | ORAL | Status: DC
  Administered 2021-07-31 – 2021-08-01 (×2): 10 mg via ORAL
  Filled 2021-07-31 (×2): qty 1

## 2021-07-31 MED ORDER — BUPIVACAINE LIPOSOME 1.3 % IJ SUSP
INTRAMUSCULAR | Status: AC
  Filled 2021-07-31: qty 20

## 2021-07-31 MED ORDER — METFORMIN HCL ER 500 MG PO TB24
1000.0000 mg | ORAL_TABLET | Freq: Every day | ORAL | Status: DC
  Administered 2021-08-01: 1000 mg via ORAL
  Filled 2021-07-31: qty 2

## 2021-07-31 MED ORDER — INSULIN ASPART 100 UNIT/ML IJ SOLN
0.0000 [IU] | Freq: Every day | INTRAMUSCULAR | Status: DC
  Administered 2021-07-31: 3 [IU] via SUBCUTANEOUS

## 2021-07-31 MED ORDER — MORPHINE SULFATE (PF) 2 MG/ML IV SOLN: 1.0000 mg | INTRAVENOUS | Status: DC | PRN

## 2021-07-31 MED ORDER — MIDAZOLAM HCL 2 MG/2ML IJ SOLN
INTRAMUSCULAR | Status: DC | PRN
  Administered 2021-07-31: 2 mg via INTRAVENOUS

## 2021-07-31 MED ORDER — 0.9 % SODIUM CHLORIDE (POUR BTL) OPTIME
TOPICAL | Status: DC | PRN
  Administered 2021-07-31: 1000 mL

## 2021-07-31 MED ORDER — SODIUM CHLORIDE 0.9 % IV SOLN: INTRAVENOUS | Status: DC | PRN

## 2021-07-31 MED ORDER — METFORMIN HCL ER 500 MG PO TB24: 500.0000 mg | ORAL_TABLET | ORAL | Status: DC

## 2021-07-31 MED ORDER — EPHEDRINE SULFATE (PRESSORS) 50 MG/ML IJ SOLN
INTRAMUSCULAR | Status: DC | PRN
  Administered 2021-07-31: 10 mg via INTRAVENOUS
  Administered 2021-07-31: 15 mg via INTRAVENOUS

## 2021-07-31 MED ORDER — ACETAMINOPHEN 650 MG RE SUPP: 650.0000 mg | RECTAL | Status: DC | PRN

## 2021-07-31 MED ORDER — ROCURONIUM BROMIDE 10 MG/ML (PF) SYRINGE
PREFILLED_SYRINGE | INTRAVENOUS | Status: DC | PRN
  Administered 2021-07-31: 50 mg via INTRAVENOUS
  Administered 2021-07-31: 30 mg via INTRAVENOUS
  Administered 2021-07-31: 70 mg via INTRAVENOUS
  Administered 2021-07-31: 50 mg via INTRAVENOUS

## 2021-07-31 MED ORDER — LACTATED RINGERS IV SOLN: INTRAVENOUS | Status: DC

## 2021-07-31 MED ORDER — CHLORHEXIDINE GLUCONATE CLOTH 2 % EX PADS: 6.0000 | MEDICATED_PAD | Freq: Once | CUTANEOUS | Status: DC

## 2021-07-31 MED ORDER — OXYCODONE-ACETAMINOPHEN 5-325 MG PO TABS
1.0000 | ORAL_TABLET | ORAL | Status: DC | PRN
  Administered 2021-07-31 – 2021-08-01 (×5): 2 via ORAL
  Filled 2021-07-31 (×5): qty 2

## 2021-07-31 MED ORDER — METHOCARBAMOL 500 MG PO TABS: 500.0000 mg | ORAL_TABLET | Freq: Four times a day (QID) | ORAL | 2 refills | Status: DC | PRN

## 2021-07-31 MED ORDER — METOPROLOL TARTRATE 25 MG PO TABS
25.0000 mg | ORAL_TABLET | Freq: Two times a day (BID) | ORAL | Status: DC
  Administered 2021-07-31 – 2021-08-01 (×2): 25 mg via ORAL
  Filled 2021-07-31 (×2): qty 1

## 2021-07-31 MED ORDER — HEMOSTATIC AGENTS (NO CHARGE) OPTIME
TOPICAL | Status: DC | PRN
  Administered 2021-07-31: 1
  Administered 2021-07-31: 2

## 2021-07-31 MED ORDER — THROMBIN 20000 UNITS EX SOLR
CUTANEOUS | Status: DC | PRN
  Administered 2021-07-31: 20000 [IU] via TOPICAL

## 2021-07-31 MED ORDER — ROSUVASTATIN CALCIUM 20 MG PO TABS
40.0000 mg | ORAL_TABLET | Freq: Every day | ORAL | Status: DC
  Administered 2021-07-31 – 2021-08-01 (×2): 40 mg via ORAL
  Filled 2021-07-31 (×2): qty 2

## 2021-07-31 MED ORDER — MIDAZOLAM HCL 2 MG/2ML IJ SOLN
INTRAMUSCULAR | Status: AC
  Filled 2021-07-31: qty 2

## 2021-07-31 MED ORDER — OXYCODONE HCL 5 MG PO TABS: 5.0000 mg | ORAL_TABLET | Freq: Once | ORAL | Status: DC | PRN

## 2021-07-31 MED ORDER — INSULIN LISPRO 100 UNIT/ML ~~LOC~~ SOLN: 100.0000 [IU] | Freq: Once | SUBCUTANEOUS | Status: DC

## 2021-07-31 MED ORDER — INSULIN LISPRO 200 UNIT/ML ~~LOC~~ SOPN: 20.0000 [IU] | PEN_INJECTOR | Freq: Every day | SUBCUTANEOUS | Status: DC | PRN

## 2021-07-31 MED ORDER — SODIUM CHLORIDE 0.9 % IV SOLN
250.0000 mL | INTRAVENOUS | Status: DC
  Administered 2021-07-31: 250 mL via INTRAVENOUS

## 2021-07-31 MED ORDER — POTASSIUM CHLORIDE IN NACL 20-0.9 MEQ/L-% IV SOLN: INTRAVENOUS | Status: DC

## 2021-07-31 MED ORDER — ONDANSETRON HCL 4 MG/2ML IJ SOLN
INTRAMUSCULAR | Status: DC | PRN
Start: 2021-07-31 — End: 2021-07-31
  Administered 2021-07-31 (×2): 4 mg via INTRAVENOUS

## 2021-07-31 MED ORDER — ALUM & MAG HYDROXIDE-SIMETH 200-200-20 MG/5ML PO SUSP: 30.0000 mL | Freq: Four times a day (QID) | ORAL | Status: DC | PRN

## 2021-07-31 MED ORDER — BUPIVACAINE LIPOSOME 1.3 % IJ SUSP
INTRAMUSCULAR | Status: DC | PRN
Start: 2021-07-31 — End: 2021-07-31
  Administered 2021-07-31: 20 mL

## 2021-07-31 MED ORDER — BISACODYL 5 MG PO TBEC
5.0000 mg | DELAYED_RELEASE_TABLET | Freq: Every day | ORAL | Status: DC | PRN
  Administered 2021-08-01: 5 mg via ORAL
  Filled 2021-07-31: qty 1

## 2021-07-31 MED ORDER — PHENYLEPHRINE HCL-NACL 20-0.9 MG/250ML-% IV SOLN
INTRAVENOUS | Status: DC | PRN
  Administered 2021-07-31: 20 ug/min via INTRAVENOUS

## 2021-07-31 MED ORDER — PHENOL 1.4 % MT LIQD: 1.0000 | OROMUCOSAL | Status: DC | PRN

## 2021-07-31 MED ORDER — ORAL CARE MOUTH RINSE: 15.0000 mL | Freq: Once | OROMUCOSAL | Status: AC

## 2021-07-31 MED ORDER — SUGAMMADEX SODIUM 200 MG/2ML IV SOLN
INTRAVENOUS | Status: DC | PRN
  Administered 2021-07-31: 193.2 mg via INTRAVENOUS

## 2021-07-31 MED ORDER — FENTANYL CITRATE (PF) 250 MCG/5ML IJ SOLN
INTRAMUSCULAR | Status: AC
  Filled 2021-07-31: qty 5

## 2021-07-31 MED ORDER — ACETAMINOPHEN 500 MG PO TABS
1000.0000 mg | ORAL_TABLET | Freq: Once | ORAL | Status: AC
  Administered 2021-07-31: 1000 mg via ORAL
  Filled 2021-07-31: qty 2

## 2021-07-31 MED ORDER — OXYCODONE-ACETAMINOPHEN 5-325 MG PO TABS: 1.0000 | ORAL_TABLET | ORAL | 0 refills | Status: DC | PRN

## 2021-07-31 MED ORDER — LACTATED RINGERS IV SOLN
INTRAVENOUS | Status: DC | PRN
Start: 2021-07-31 — End: 2021-07-31

## 2021-07-31 MED ORDER — METOPROLOL TARTRATE 50 MG PO TABS
25.0000 mg | ORAL_TABLET | Freq: Once | ORAL | Status: AC
  Administered 2021-07-31: 25 mg via ORAL

## 2021-07-31 MED ORDER — ZOLPIDEM TARTRATE 5 MG PO TABS: 5.0000 mg | ORAL_TABLET | Freq: Every evening | ORAL | Status: DC | PRN

## 2021-07-31 MED ORDER — METOPROLOL TARTRATE 12.5 MG HALF TABLET
ORAL_TABLET | ORAL | Status: AC
  Filled 2021-07-31: qty 2

## 2021-07-31 MED ORDER — PROPOFOL 500 MG/50ML IV EMUL
INTRAVENOUS | Status: DC | PRN
  Administered 2021-07-31: 50 ug/kg/min via INTRAVENOUS

## 2021-07-31 MED ORDER — INSULIN ASPART 100 UNIT/ML IJ SOLN
0.0000 [IU] | Freq: Three times a day (TID) | INTRAMUSCULAR | Status: DC
  Administered 2021-08-01 (×2): 3 [IU] via SUBCUTANEOUS

## 2021-07-31 MED ORDER — ACETAMINOPHEN 325 MG PO TABS: 650.0000 mg | ORAL_TABLET | ORAL | Status: DC | PRN

## 2021-07-31 MED ORDER — INSULIN PEN NEEDLE 29G X 5MM MISC: 1.0000 | Freq: Four times a day (QID) | Status: DC

## 2021-07-31 MED ORDER — ONDANSETRON HCL 4 MG/2ML IJ SOLN: 4.0000 mg | Freq: Four times a day (QID) | INTRAMUSCULAR | Status: DC | PRN

## 2021-07-31 MED ORDER — MENTHOL 3 MG MT LOZG: 1.0000 | LOZENGE | OROMUCOSAL | Status: DC | PRN

## 2021-07-31 SURGICAL SUPPLY — 123 items
APL SKNCLS STERI-STRIP NONHPOA (GAUZE/BANDAGES/DRESSINGS) ×2
APPLIER CLIP 11 MED OPEN (CLIP) ×4
APR CLP MED 11 20 MLT OPN (CLIP) ×2
BAG COUNTER SPONGE SURGICOUNT (BAG) ×6 IMPLANT
BAG SPNG CNTER NS LX DISP (BAG) ×3
BENZOIN TINCTURE PRP APPL 2/3 (GAUZE/BANDAGES/DRESSINGS) ×2 IMPLANT
BLADE CLIPPER SURG (BLADE) IMPLANT
BLADE SURG 10 STRL SS (BLADE) ×2 IMPLANT
BUR PRESCISION 1.7 ELITE (BURR) ×2 IMPLANT
BUR ROUND FLUTED 5 RND (BURR) ×2 IMPLANT
BUR ROUND PRECISION 4.0 (BURR) IMPLANT
BUR SABER RD CUTTING 3.0 (BURR) IMPLANT
CARTRIDGE OIL MAESTRO DRILL (MISCELLANEOUS) ×1 IMPLANT
CLIP APPLIE 11 MED OPEN (CLIP) ×2 IMPLANT
CLIP LIGATING EXTRA MED SLVR (CLIP) ×2 IMPLANT
CLSR STERI-STRIP ANTIMIC 1/2X4 (GAUZE/BANDAGES/DRESSINGS) ×2 IMPLANT
CNTNR URN SCR LID CUP LEK RST (MISCELLANEOUS) ×1 IMPLANT
CONT SPEC 4OZ STRL OR WHT (MISCELLANEOUS) ×2
CORD BIPOLAR FORCEPS 12FT (ELECTRODE) ×2 IMPLANT
COVER MAYO STAND STRL (DRAPES) ×4 IMPLANT
COVER SURGICAL LIGHT HANDLE (MISCELLANEOUS) ×4 IMPLANT
DIFFUSER DRILL AIR PNEUMATIC (MISCELLANEOUS) ×2 IMPLANT
DRAIN CHANNEL 15F RND FF W/TCR (WOUND CARE) IMPLANT
DRAPE C-ARM 42X72 X-RAY (DRAPES) ×6 IMPLANT
DRAPE C-ARMOR (DRAPES) IMPLANT
DRAPE POUCH INSTRU U-SHP 10X18 (DRAPES) ×5 IMPLANT
DRAPE SURG 17X23 STRL (DRAPES) ×14 IMPLANT
DRSG MEPILEX BORDER 4X12 (GAUZE/BANDAGES/DRESSINGS) ×2 IMPLANT
DURAPREP 26ML APPLICATOR (WOUND CARE) ×4 IMPLANT
ELECT BLADE 4.0 EZ CLEAN MEGAD (MISCELLANEOUS) ×6
ELECT CAUTERY BLADE 6.4 (BLADE) ×4 IMPLANT
ELECT REM PT RETURN 9FT ADLT (ELECTROSURGICAL) ×4
ELECTRODE BLDE 4.0 EZ CLN MEGD (MISCELLANEOUS) ×3 IMPLANT
ELECTRODE REM PT RTRN 9FT ADLT (ELECTROSURGICAL) ×2 IMPLANT
EVACUATOR SILICONE 100CC (DRAIN) IMPLANT
FELT TEFLON 1X6 (MISCELLANEOUS) ×1 IMPLANT
FILTER STRAW FLUID ASPIR (MISCELLANEOUS) ×2 IMPLANT
GAUZE 4X4 16PLY ~~LOC~~+RFID DBL (SPONGE) ×2 IMPLANT
GAUZE SPONGE 4X4 12PLY STRL (GAUZE/BANDAGES/DRESSINGS) ×2 IMPLANT
GLOVE SRG 8 PF TXTR STRL LF DI (GLOVE) ×3 IMPLANT
GLOVE SURG ENC MOIS LTX SZ6.5 (GLOVE) ×4 IMPLANT
GLOVE SURG ENC MOIS LTX SZ8 (GLOVE) ×4 IMPLANT
GLOVE SURG NONLX 8.0 ULT (GLOVE) ×2 IMPLANT
GLOVE SURG UNDER POLY LF SZ7 (GLOVE) ×4 IMPLANT
GLOVE SURG UNDER POLY LF SZ8 (GLOVE) ×6
GOWN STRL REUS W/ TWL LRG LVL3 (GOWN DISPOSABLE) ×4 IMPLANT
GOWN STRL REUS W/ TWL XL LVL3 (GOWN DISPOSABLE) ×3 IMPLANT
GOWN STRL REUS W/TWL LRG LVL3 (GOWN DISPOSABLE) ×8
GOWN STRL REUS W/TWL XL LVL3 (GOWN DISPOSABLE) ×6
GUIDEWIRE SHARP VIPER II (WIRE) ×5 IMPLANT
HEMOSTAT SNOW SURGICEL 2X4 (HEMOSTASIS) ×3 IMPLANT
HEMOSTAT SURGICEL 2X14 (HEMOSTASIS) IMPLANT
INSERT FOGARTY 61MM (MISCELLANEOUS) IMPLANT
INSERT FOGARTY SM (MISCELLANEOUS) IMPLANT
IV CATH 14GX2 1/4 (CATHETERS) ×2 IMPLANT
KIT ALARA NEURO ACCESS (KITS) ×2 IMPLANT
KIT BASIN OR (CUSTOM PROCEDURE TRAY) ×4 IMPLANT
KIT POSITION SURG JACKSON T1 (MISCELLANEOUS) ×2 IMPLANT
KIT TURNOVER KIT B (KITS) ×4 IMPLANT
MARKER SKIN DUAL TIP RULER LAB (MISCELLANEOUS) ×4 IMPLANT
NDL 18GX1X1/2 (RX/OR ONLY) (NEEDLE) ×1 IMPLANT
NDL HYPO 25GX1X1/2 BEV (NEEDLE) ×2 IMPLANT
NDL SPNL 18GX3.5 QUINCKE PK (NEEDLE) ×3 IMPLANT
NEEDLE 18GX1X1/2 (RX/OR ONLY) (NEEDLE) ×2 IMPLANT
NEEDLE 22X1 1/2 (OR ONLY) (NEEDLE) ×4 IMPLANT
NEEDLE HYPO 25GX1X1/2 BEV (NEEDLE) ×4 IMPLANT
NEEDLE SPNL 18GX3.5 QUINCKE PK (NEEDLE) ×6 IMPLANT
NS IRRIG 1000ML POUR BTL (IV SOLUTION) ×4 IMPLANT
OIL CARTRIDGE MAESTRO DRILL (MISCELLANEOUS) ×2
PACK LAMINECTOMY ORTHO (CUSTOM PROCEDURE TRAY) ×4 IMPLANT
PACK UNIVERSAL I (CUSTOM PROCEDURE TRAY) ×4 IMPLANT
PAD ARMBOARD 7.5X6 YLW CONV (MISCELLANEOUS) ×12 IMPLANT
PATTIES SURGICAL .5 X1 (DISPOSABLE) ×2 IMPLANT
PATTIES SURGICAL .5X1.5 (GAUZE/BANDAGES/DRESSINGS) ×2 IMPLANT
PENCIL SMOKE EVACUATOR (MISCELLANEOUS) ×2 IMPLANT
PUTTY BONE DBX 5CC MIX (Putty) ×2 IMPLANT
ROD VIPER II LORDOSED 5.5X40 (Rod) ×2 IMPLANT
SCREW CANN PA CORT 7X55 (Screw) ×2 IMPLANT
SCREW POLY X-TAB VIPER 7.0X50 (Screw) ×2 IMPLANT
SCREW SET SINGLE INNER MIS (Screw) ×4 IMPLANT
SPACER ALIF EXP MAG 26X34 8D (Spacer) ×1 IMPLANT
SPONGE INTESTINAL PEANUT (DISPOSABLE) ×8 IMPLANT
SPONGE SURGIFOAM ABS GEL 100 (HEMOSTASIS) ×4 IMPLANT
SPONGE T-LAP 18X18 ~~LOC~~+RFID (SPONGE) ×2 IMPLANT
SPONGE T-LAP 4X18 ~~LOC~~+RFID (SPONGE) ×4 IMPLANT
SUT MNCRL AB 4-0 PS2 18 (SUTURE) ×4 IMPLANT
SUT PDS AB 1 CTX 36 (SUTURE) ×6 IMPLANT
SUT PROLENE 4 0 RB 1 (SUTURE)
SUT PROLENE 4-0 RB1 .5 CRCL 36 (SUTURE) IMPLANT
SUT PROLENE 5 0 C 1 24 (SUTURE) IMPLANT
SUT PROLENE 5 0 CC1 (SUTURE) IMPLANT
SUT PROLENE 5 0 RB 1 DA (SUTURE) ×3 IMPLANT
SUT PROLENE 6 0 BV (SUTURE) ×4 IMPLANT
SUT PROLENE 6 0 C 1 30 (SUTURE) ×2 IMPLANT
SUT SILK 0 TIES 10X30 (SUTURE) ×2 IMPLANT
SUT SILK 2 0 TIES 10X30 (SUTURE) ×4 IMPLANT
SUT SILK 2 0SH CR/8 30 (SUTURE) IMPLANT
SUT SILK 3 0 TIES 10X30 (SUTURE) ×2 IMPLANT
SUT SILK 3 0 TIES 17X18 (SUTURE) ×2
SUT SILK 3 0SH CR/8 30 (SUTURE) IMPLANT
SUT SILK 3-0 18XBRD TIE BLK (SUTURE) ×1 IMPLANT
SUT VIC AB 0 CT1 18XCR BRD 8 (SUTURE) ×1 IMPLANT
SUT VIC AB 0 CT1 8-18 (SUTURE) ×2
SUT VIC AB 1 CT1 18XCR BRD 8 (SUTURE) ×1 IMPLANT
SUT VIC AB 1 CT1 27 (SUTURE) ×4
SUT VIC AB 1 CT1 27XBRD ANBCTR (SUTURE) ×2 IMPLANT
SUT VIC AB 1 CT1 8-18 (SUTURE) ×2
SUT VIC AB 1 CTX 36 (SUTURE) ×4
SUT VIC AB 1 CTX36XBRD ANBCTR (SUTURE) ×2 IMPLANT
SUT VIC AB 2-0 CT2 18 VCP726D (SUTURE) ×4 IMPLANT
SYR 20ML LL LF (SYRINGE) ×4 IMPLANT
SYR BULB IRRIG 60ML STRL (SYRINGE) ×4 IMPLANT
SYR CONTROL 10ML LL (SYRINGE) ×4 IMPLANT
SYR TB 1ML LUER SLIP (SYRINGE) ×2 IMPLANT
TAP CANN VIPER2 DL 6.0 (TAP) ×2 IMPLANT
TAP CANN VIPER2 DL 7.0 (TAP) ×2 IMPLANT
TOWEL GREEN STERILE (TOWEL DISPOSABLE) ×4 IMPLANT
TOWEL GREEN STERILE FF (TOWEL DISPOSABLE) ×2 IMPLANT
TRAY FOLEY MTR SLVR 16FR STAT (SET/KITS/TRAYS/PACK) ×2 IMPLANT
TRAY FOLEY W/BAG SLVR 16FR (SET/KITS/TRAYS/PACK) ×2
TRAY FOLEY W/BAG SLVR 16FR ST (SET/KITS/TRAYS/PACK) ×1 IMPLANT
WATER STERILE IRR 1000ML POUR (IV SOLUTION) ×4 IMPLANT
YANKAUER SUCT BULB TIP NO VENT (SUCTIONS) ×4 IMPLANT

## 2021-07-31 NOTE — Op Note (Signed)
PATIENT NAME: [name] [name]  ? ?MEDICAL RECORD NO.:   [id number]  ?  ?DATE OF BIRTH: 1960/12/01 ? ?DATE OF PROCEDURE: 07/31/2021  ? ? ?                            OPERATIVE REPORT ? ? ?PREOPERATIVE DIAGNOSES: ?1. Left leg pain ?2. L5/S1 spondylolisthesis (M43.17, M53.2X7) ?3. L5/S1 spinal stenosis, severe ? ?POSTOPERATIVE DIAGNOSES: ?1. Left leg pain ?2. L5/S1 spondylolisthesis (M43.17, M53.2X7) ?3. L5/S1 spinal stenosis, severe ? ?PROCEDURE: ?1. Anterior lumbar interbody fusion, L5-S1 ?2. Insertion of interbody device x 1 (Globus expandable intervertebral spacers). ?3. Intraoperative use of fluoroscopy. ?4. Posterior spinal fusion, L5-S1. ?5. Placement of posterior instrumentation, L5, S1. ?6. Use of morselized (DBX-mix) ?7. Retroperitoneal exposure to L5/S1 (with Dr. Elna Breslow as co-surgeon) ? ?SURGEON:  Estill Bamberg, MD ? ?ASSISTANT:  Jason Coop, PA-C ? ?ANESTHESIA:  General endotracheal anesthesia. ? ?COMPLICATIONS:  None. ? ?DISPOSITION:  Stable. ? ?ESTIMATED BLOOD LOSS:  200 mL. ? ?INDICATIONS FOR SURGERY:  Briefly, [name] [name] is a very pleasant 61 y.o. -year-old male, who did present to me with severe and debilitating pain in his left leg.  The patient's MRI was notable for ?the findings outlined above.  The patient is failed multiple nonoperative measures, including various injections and physical therapy.  Given his ongoing pain and dysfunction, we did discuss proceeding with the procedure noted above.  The patient was fully aware that surgery may or ?may not alleviate her ongoing symptoms, particularly her low back pain. ?The patient did elect to proceed. ? ?OPERATIVE DETAILS:  On 07/31/2021, the patient was brought to surgery ?and general endotracheal anesthesia was administered.  The patient was ?placed supine on the hospital bed.  The patient's abdomen was prepped ?and draped in the usual sterile fashion.  An anterior retroperitoneal ?approach was then performed by myself, and Dr. Elna Breslow, the  details of which will dictated on a separate note. I did function as his cosurgeon during the approach.  Once the anterior lumbar spine was ?noted, we did focus our attention on the L5-S1 intervertebral space.  I ?then performed a thorough and complete L5-S1 intervertebral diskectomy ?to the level of the posterior longitudinal ligament.  I was very pleased ?with the diskectomy that I was able to accomplish.  The endplates were ?then appropriately prepared and the appropriate sized anterior ?intervertebral spacer was packed with DBX-mix and tamped into position. ?The intervertebral spacer was then expanded to approximately 10.5 mm in height. I was very pleased with the press-fit of the implant.  I was very pleased with the final AP and lateral fluoroscopic images.  The wound was copiously irrigated.  The fascia was closed using #1 PDS.  The subcutaneous layer was closed using 0 Vicryl followed by 2-0 Vicryl, and the skin was closed using 4-0 Monocryl. Benzoin and Steri-Strips were applied followed by sterile dressing.   ? ?The patient was then rolled prone onto a Jackson spinal bed.  The back was ?then prepped and draped in the usual sterile fashion.  I then made ?paramedian incisions on the right and left sides, just lateral to the ?lateral borders of the pedicles of L5 and S1.  On the left side, the ?posterolateral gutter and posterior elements were identified and exposed ?and decorticated.  The remainder of the DBX-mix was packed into the ?posterolateral gutter on the left side to aid in the success of the ?posterior fusion.  I then  tapped the L5, and S1 pedicles bilaterally ?up to a 6 mm tap.  I then placed 7 x 50 mm screws bilaterally at L5 and 7 x 505 mm screws bilaterally S1.  40 mm rods were then secured into the tulip heads of the screws bilaterally.  Caps were then placed and a final locking procedure was performed bilaterally.  I was very pleased with the final AP and lateral fluoroscopic images.  The  wound was then copiously irrigated.  On the right and left sides, the fascia was closed using #1 Vicryl.  The subcutaneous layer was closed using 0 Vicryl followed by 2-0 Vicryl, and the skin was then closed using 4-0 Monocryl. Benzoin and Steri-Strips were applied followed by sterile dressing.  All instrument counts were correct at the termination of the procedure. ? ?Of note, Jason Coop was my assistant throughout surgery, and did aid ?in retraction, placement of the hardware, suctioning, and closure for both the anterior and posterior portions of the procedure. ? ? ?Estill Bamberg, MD   ?

## 2021-07-31 NOTE — H&P (Signed)
? ? ? ?PREOPERATIVE H&P ? ?Chief Complaint: Left leg pain ? ?HPI: ?[name] [name] is a 61 y.o. male who presents with ongoing pain in the left leg ? ?MRI reveals severe NF stenosis at L5/S1 and instability ? ?Patient has failed multiple forms of conservative care and continues to have pain (see office notes for additional details regarding the patient's full course of treatment) ? ?Past Medical History:  ?Diagnosis Date  ? Anxiety   ? Chronic lower back pain   ? Coronary artery disease 03/2004  ? 3 stents put in   ? Depression   ? Fibromyalgia   ? GERD (gastroesophageal reflux disease)   ? pt denies d/t weight loss  ? History of hiatal hernia   ? states no longer there per endoscopy  ? Hyperlipidemia LDL goal <70   ? Hypertension   ? Left leg pain   ? Low back pain   ? Migraine   ? "weekly; back in the 1990s" (11/12/2016)  ? Myocardial infarction [facility]) 2005  ? Neuroforaminal stenosis of lumbar spine   ? L5-S1  ? [name] (obstructive sleep apnea)   ? "quit wearing my mask" (11/12/2016)  ? Spondylolisthesis, grade 2   ? Type II diabetes mellitus ([place]) 2010  ? ?Past Surgical History:  ?Procedure Laterality Date  ? CARDIAC CATHETERIZATION  11/12/2016  ? CORONARY ANGIOPLASTY WITH STENT PLACEMENT  03/2004   ? x 3 stents  ? CORONARY ARTERY BYPASS GRAFT N/A 11/17/2016  ? Procedure: CORONARY ARTERY BYPASS GRAFTING (CABG) x 5, USING RIGHT INTERNAL MAMMARY ARTERY AND RIGHT GREATER SAPHENOUS VEIN HARVESTED ENDOSCOPICALLY;  Surgeon: Ivin Poot, MD;  Location: [facility];  Service: Open Heart Surgery;  Laterality: N/A;  ? INGUINAL HERNIA REPAIR Right 2000  ? LEFT HEART CATH AND CORONARY ANGIOGRAPHY N/A 11/12/2016  ? Procedure: Left Heart Cath and Coronary Angiography;  Surgeon: Lorretta Harp, MD;  Location: [place] [facility];  Service: Cardiovascular;  Laterality: N/A;  ? TEE WITHOUT CARDIOVERSION N/A 11/17/2016  ? Procedure: TRANSESOPHAGEAL ECHOCARDIOGRAM (TEE);  Surgeon: Prescott Gum, Collier Salina, MD;  Location: [facility];  Service: Open Heart  Surgery;  Laterality: N/A;  ? ?Social History  ? ?Socioeconomic History  ? Marital status: Divorced  ?  Spouse name: Not on file  ? Number of children: 1   ? Years of education: 77   ? Highest education level: Not on file  ?Occupational History  ? Occupation: Unemployed   ?Tobacco Use  ? Smoking status: Never  ? Smokeless tobacco: Never  ?Vaping Use  ? Vaping Use: Never used  ?Substance and Sexual Activity  ? Alcohol use: Yes  ?  Alcohol/week: 2.0 standard drinks  ?  Types: 2 Cans of beer per week  ? Drug use: Yes  ?  Types: Marijuana  ?  Comment: once a month- smoking marijuana  ? Sexual activity: Never  ?Other Topics Concern  ? Not on file  ?Social History Narrative  ? Lives alone.   ? Daughter lives nearby,5 miles away  ? Sister next door.   ? 1 grandchild, 32 yo granddaughter   ? ?Social Determinants of Health  ? ?Financial Resource Strain: Not on file  ?Food Insecurity: Not on file  ?Transportation Needs: Not on file  ?Physical Activity: Not on file  ?Stress: Not on file  ?Social Connections: Not on file  ? ?Family History  ?Problem Relation Age of Onset  ? Diabetes Father   ? Diabetes Sister   ? Diabetes Paternal Grandfather   ?  Cancer Neg Hx   ? ?No Known Allergies ?Prior to Admission medications   ?Medication Sig Start Date End Date Taking? Authorizing Provider  ?amitriptyline (ELAVIL) 25 MG tablet Take 25 mg by mouth at bedtime. 07/17/21  Yes [provider]  ?aspirin EC 81 MG tablet Take 81 mg by mouth daily.   Yes [provider]  ?gabapentin (NEURONTIN) 600 MG tablet Take 1,200 mg by mouth every 12 (twelve) hours. 01/29/21  Yes [provider]  ?insulin lispro (HUMALOG KWIKPEN) 200 UNIT/ML KwikPen Inject 20 Units into the skin 5 (five) times daily as needed (blood sugar over 200).   Yes [provider]  ?insulin lispro (HUMALOG) 100 UNIT/ML injection MAX DAILY 100 UNITS, this is correct dose in vials and not pens 06/10/21  Yes Shamleffer, Melanie Crazier, MD  ?lisinopril  (ZESTRIL) 5 MG tablet Take 10 mg by mouth daily.   Yes [provider]  ?meloxicam (MOBIC) 15 MG tablet Take 15 mg by mouth daily.   Yes [provider]  ?metFORMIN (GLUCOPHAGE-XR) 500 MG 24 hr tablet Take 500-1,000 mg by mouth See admin instructions. Take 1000 mg in the morning and 500 mg in the evening   Yes [provider]  ?metoprolol tartrate (LOPRESSOR) 25 MG tablet Take 1 tablet (25 mg total) by mouth 2 (two) times daily. 11/25/16  Yes Lars Pinks M, PA-C  ?rosuvastatin (CRESTOR) 40 MG tablet Take 1 tablet (40 mg total) by mouth daily. 01/14/21  Yes Richardo Priest, MD  ?Continuous Blood Gluc Transmit (DEXCOM G6 TRANSMITTER) MISC Change every 10 days as needed/directed 02/13/20   Shamleffer, Melanie Crazier, MD  ?Insulin Pen Needle 29G X 5MM MISC 1 Device by Does not apply route in the morning, at noon, in the evening, and at bedtime. 03/25/21   Shamleffer, Melanie Crazier, MD  ?Jonetta Speak Lancets [id number] MISC 1 each by Does not apply route in the morning, at noon, in the evening, and at bedtime. Use Onetouch Delica lancets to check blood sugar 3-4 times daily.    [provider]  ?Donald Siva test strip 1 EACH BY OTHER ROUTE 3 (THREE) TIMES DAILY. USE ONETOUCH ULTRA TEST STRIPS AS INSTRUCTED TO CHECK BLOOD SUGAR 3-4 TIMES DAILY. 09/11/20   Shamleffer, Melanie Crazier, MD  ? ? ? ?All other systems have been reviewed and were otherwise negative with the exception of those mentioned in the HPI and as above. ? ?Physical Exam: ?Vitals:  ? 07/31/21 0559  ?BP: (!) 175/96  ?Pulse: 76  ?Resp: 17  ?Temp: 98.4 ?F (36.9 ?C)  ?SpO2: 99%  ? ? ?Body mass index is 29.71 kg/m?. ? ?General: Alert, no acute distress ?Cardiovascular: No pedal edema ?Respiratory: No cyanosis, no use of accessory musculature ?Skin: No lesions in the area of chief complaint ?Neurologic: Sensation intact distally ?Psychiatric: Patient is competent for consent with normal mood and affect ?Lymphatic: No  axillary or cervical lymphadenopathy ? ? ?Assessment/Plan: ?Ongoing progressive left leg pain, due to the patient's instability and severe neuroforaminal stenosis at L5-S1. ?Plan for Procedure(s): ?ANTERIOR LUMBAR INTERBODY FUSION LUMBAR 5 - SACRUM 1 WITH INSTRUMENTATION AND ALLOGRAFT ?POSTERIOR DECOMPRESSION AND FUSION LUMBAR 5- SACRUM 1 WITH INSTRUMENTATION AND ALLOGRAFT ?ABDOMINAL EXPOSURE ? ? ?Norva Karvonen, MD ?07/31/2021 ?6:36 AM  ?

## 2021-07-31 NOTE — Anesthesia Postprocedure Evaluation (Signed)
Anesthesia Post Note ? ?Patient: [name] [name] ? ?Procedure(s) Performed: ANTERIOR LUMBAR INTERBODY FUSION LUMBAR 5 - SACRUM 1 WITH INSTRUMENTATION AND ALLOGRAFT ?POSTERIOR DECOMPRESSION AND FUSION LUMBAR 5- SACRUM 1 WITH INSTRUMENTATION AND ALLOGRAFT ?ABDOMINAL EXPOSURE ? ?  ? ?Patient location during evaluation: PACU ?Anesthesia Type: General ?Level of consciousness: awake ?Pain management: pain level controlled ?Vital Signs Assessment: post-procedure vital signs reviewed and stable ?Respiratory status: spontaneous breathing and respiratory function stable ?Cardiovascular status: stable ?Postop Assessment: no apparent nausea or vomiting ?Anesthetic complications: no ? ? ?No notable events documented. ? ?Last Vitals:  ?Vitals:  ? 07/31/21 1336 07/31/21 1351  ?BP: 101/75 103/79  ?Pulse:  65  ?Resp: 19 15  ?Temp:    ?SpO2:  95%  ?  ?Last Pain:  ?Vitals:  ? 07/31/21 1351  ?TempSrc:   ?PainSc: 10-Worst pain ever  ? ? ?  ?  ?  ?  ?  ?  ? ?Mellody Dance ? ? ? ? ?

## 2021-07-31 NOTE — Transfer of Care (Signed)
Immediate Anesthesia Transfer of Care Note ? ?Patient: [name] [name] ? ?Procedure(s) Performed: ANTERIOR LUMBAR INTERBODY FUSION LUMBAR 5 - SACRUM 1 WITH INSTRUMENTATION AND ALLOGRAFT ?POSTERIOR DECOMPRESSION AND FUSION LUMBAR 5- SACRUM 1 WITH INSTRUMENTATION AND ALLOGRAFT ?ABDOMINAL EXPOSURE ? ?Patient Location: PACU ? ?Anesthesia Type:General ? ?Level of Consciousness: sedated ? ?Airway & Oxygen Therapy: Patient Spontanous Breathing and Patient connected to nasal cannula oxygen ? ?Post-op Assessment: Report given to RN and Post -op Vital signs reviewed and stable ? ?Post vital signs: Reviewed and stable ? ?Last Vitals:  ?Vitals Value Taken Time  ?BP 92/61 07/31/21 1256  ?Temp    ?Pulse 71 07/31/21 1258  ?Resp 27 07/31/21 1258  ?SpO2 92 % 07/31/21 1258  ?Vitals shown include unvalidated device data. ? ?Last Pain:  ?Vitals:  ? 07/31/21 0625  ?TempSrc:   ?PainSc: 2   ?   ? ?Patients Stated Pain Goal: 5 (07/31/21 9935) ? ?Complications: No notable events documented. ?

## 2021-07-31 NOTE — Interval H&P Note (Signed)
History and Physical Interval Note: ? ?07/31/2021 ?7:20 AM ? ?[name] [name]  has presented today for surgery, with the diagnosis of Ongoing progressive left leg pain, certainly due to the patient's instability and severe neuroforaminal stenosis at L5-S1..  The various methods of treatment have been discussed with the patient and family. After consideration of risks, benefits and other options for treatment, the patient has consented to  Procedure(s): ?ANTERIOR LUMBAR INTERBODY FUSION LUMBAR 5 - SACRUM 1 WITH INSTRUMENTATION AND ALLOGRAFT (N/A) ?POSTERIOR DECOMPRESSION AND FUSION LUMBAR 5- SACRUM 1 WITH INSTRUMENTATION AND ALLOGRAFT (N/A) ?ABDOMINAL EXPOSURE (N/A) as a surgical intervention.  The patient's history has been reviewed, patient examined, no change in status, stable for surgery.  I have reviewed the patient's chart and labs.  Questions were answered to the patient's satisfaction.   ? ? ?Leonie Douglas ? ? ?

## 2021-07-31 NOTE — Anesthesia Procedure Notes (Signed)
Procedure Name: Intubation ?Date/Time: 07/31/2021 7:50 AM ?Performed by: Darryl Nestle, CRNA ?Pre-anesthesia Checklist: Patient identified, Emergency Drugs available, Suction available and Patient being monitored ?Patient Re-evaluated:Patient Re-evaluated prior to induction ?Oxygen Delivery Method: Circle system utilized ?Preoxygenation: Pre-oxygenation with 100% oxygen ?Induction Type: IV induction ?Ventilation: Mask ventilation with difficulty ?Laryngoscope Size: 3 ?Grade View: Grade I ?Tube type: Oral ?Tube size: 7.0 mm ?Number of attempts: 1 ?Airway Equipment and Method: Stylet and Oral airway ?Placement Confirmation: ETT inserted through vocal cords under direct vision, positive ETCO2 and breath sounds checked- equal and bilateral ?Secured at: 23 cm ?Tube secured with: Tape ?Dental Injury: Teeth and Oropharynx as per pre-operative assessment  ? ? ? ? ?

## 2021-07-31 NOTE — Anesthesia Procedure Notes (Signed)
Arterial Line Insertion ?Start/End3/23/2023 7:00 AM, 07/31/2021 7:10 AM ?Performed by: Mellody Dance, MD, Darryl Nestle, CRNA, CRNA ? Patient location: Pre-op. ?Preanesthetic checklist: patient identified, IV checked, site marked, risks and benefits discussed, surgical consent, monitors and equipment checked, pre-op evaluation, timeout performed and anesthesia consent ?Lidocaine 1% used for infiltration ?radial was placed ?Catheter size: 20 G ?Hand hygiene performed  and maximum sterile barriers used  ? ?Attempts: 2 ?Procedure performed using ultrasound guided technique. ?Following insertion, dressing applied and Biopatch. ?Post procedure assessment: normal and unchanged ? ?Patient tolerated the procedure well with no immediate complications. ? ? ?

## 2021-07-31 NOTE — OR Nursing (Signed)
Per protocol Dr.Clark called on 07/31/2021 at 1127 and confirmed that there was on instrumentation or foreign body retained ?

## 2021-08-01 LAB — GLUCOSE, CAPILLARY
Glucose-Capillary: 167 mg/dL — ABNORMAL HIGH (ref 70–99)
Glucose-Capillary: 190 mg/dL — ABNORMAL HIGH (ref 70–99)

## 2021-08-01 MED ORDER — METHOCARBAMOL 500 MG PO TABS: 500.0000 mg | ORAL_TABLET | Freq: Four times a day (QID) | ORAL | 2 refills | Status: DC | PRN

## 2021-08-01 MED ORDER — TAMSULOSIN HCL 0.4 MG PO CAPS
0.4000 mg | ORAL_CAPSULE | Freq: Every day | ORAL | Status: DC
  Administered 2021-08-01: 0.4 mg via ORAL
  Filled 2021-08-01: qty 1

## 2021-08-01 MED FILL — Thrombin (Recombinant) For Soln 20000 Unit: CUTANEOUS | Qty: 1 | Status: AC

## 2021-08-01 NOTE — Op Note (Addendum)
DATE OF SERVICE: 07/31/21 ? ?PATIENT:  [name] [name]  61 y.o. male ? ?PRE-OPERATIVE DIAGNOSIS:  back pain with radiculopathy ? ?POST-OPERATIVE DIAGNOSIS:  Same ? ?PROCEDURE:   ?Anterior exposure of L5/S1 disc space ? ?SURGEON:  Surgeon(s) and Role: ?Panel 1: ?   Estill Bamberg, MD - Primary ?Panel 2: ?   * Leonie Douglas, MD - Primary ?   Cephus Shelling, MD ? ?ASSISTANT: Sherald Hess, MD ? ?An experienced assistant was required given the complexity of this procedure and the standard of surgical care. My assistant helped with exposure through counter tension, suctioning, ligation and retraction to better visualize the surgical field.  My assistant expedited sewing during the case by following my sutures. Wherever I use the term "we" in the report, my assistant actively helped me with that portion of the procedure. ? ?ANESTHESIA:   local and IV sedation ? ?EBL: minimal ? ?BLOOD ADMINISTERED:none ? ?DRAINS: none  ? ?LOCAL MEDICATIONS USED:  LIDOCAINE  ? ?SPECIMEN:  none ? ?COUNTS: confirmed correct. ? ?TOURNIQUET:  none ? ?PATIENT DISPOSITION:  PACU - hemodynamically stable. ?  ?Delay start of Pharmacological VTE agent (>24hrs) due to surgical blood loss or risk of bleeding: no ? ?INDICATION FOR PROCEDURE: [name] [name] is a 61 y.o. male with back pain with radiculopathy. After careful discussion of risks, benefits, and alternatives the patient was offered anterior spinal exposure. The patient understood and wished to proceed. ? ?OPERATIVE FINDINGS: Large left common iliac vein overriding the L5/S1 disc space.  Difficult to mobilize.  Small rents in the vein made during mobilization were repaired primarily.  I asked [name] [name] to assist me at this point to minimize the risk of catastrophic vascular injury.  Ultimately able to get adequate exposure for [name] [name] to proceed with anterior lumbar interbody fusion. ? ?DESCRIPTION OF PROCEDURE: After identification of the patient in the pre-operative  holding area, the patient was transferred to the operating room. The patient was positioned supine on the operating room table. Anesthesia was induced. The abdomen was prepped and draped in standard fashion. A surgical pause was performed confirming correct patient, procedure, and operative location. ? ?Using intraoperative fluoroscopy the disc base of L5/S1 was identified.  An incision was planned on the lower abdomen.  The abdomen was prepped and draped.  Incision was made as planned with a 10 blade.  Incision was carried down through subcutaneous tissue until rectus fascia was identified and divided.  The rectus muscle was mobilized.  I bluntly entered the retroperitoneal space at the edge of the rectus muscle using digital dissection.  I continued digital dissection until I identified the psoas muscle and then carried exposure medially.  Ultimately I was able to identify a large left common iliac vein.  This is overriding the L5/S1 disc base.  Further exposure was quite tedious as the vein was not easily mobilized laterally away from the disc base.  I did create 2 small rents in the vein which were repaired with simple Prolene suture.  Given the risk of further bleeding, I asked of my partner to join me at this point to improve the exposure.  We divided and middle sacral branch.  We came down onto the disc space through the prevertebral fascia.  We then able to mobilize the tissue with the fascia divided fairly easily and ultimately were able to create adequate exposure for [name] [name].  At this point the case that turned the patient's care over to Dr.  Dumonski.  I asked him to call me when they let the retractors down.  I rejoined the case after her [name]'s procedure.  There was a small area of bleeding again noted from a branch over the left common iliac vein.  This was repaired with a simple pledgeted 5-0 Prolene suture.  Hemostasis was achieved.  We monitored the surgical bed for hemostasis for several  minutes.  The surgical bed was flooded with irrigation.  No further bleeding was noted.  I returned the retroperitoneum to its normal position and assisted PA McKenzie with closing the rectus fascia and Scarpa's fascia. ? ?Upon completion of the case instrument and sharps counts were confirmed correct. The patient was transferred to the PACU in good condition. I was present for all portions of the procedure. ? ?Rande Brunt. Lenell Antu, MD ?[facility] ?Office Phone Number: [phone] ?08/01/2021 8:13 AM ? ? ? ?

## 2021-08-01 NOTE — Progress Notes (Signed)
? ? ?  Patient doing well PO day 1 S/P L5/S1 ANT/POST fusion for BIL radiculopathy. Pt has been up and walking and educated on TLSO brace, appears comfortable and pain well controlled with current regimen. Eating and drinking well, +gas, normal B/B function.  ? ? ?Physical Exam: ?Vitals:  ? 08/01/21 0407 08/01/21 0735  ?BP: 124/72 130/81  ?Pulse: 79 82  ?Resp: 20 16  ?Temp: 98.5 ?F (36.9 ?C)   ?SpO2: 99% 98%  ? ? ?Dressings in place, CDI, pt up and walking well with therapy mild assistance form rolling walker.  ?NVI ? ?POD #1 s/p L5/S1 ANT/POST fusion for BIL radiculopathy, substantial improvement  ? ?- up with PT/OT, encourage ambulation ?- Percocet for pain, Robaxin for muscle spasms ? -Sent to pharm electronically  ?- likely d/c home today with f/u in 2 weeks  ?

## 2021-08-01 NOTE — Progress Notes (Addendum)
?  Progress Note ? ? ? ?08/01/2021 ?8:26 AM ?1 Day Post-Op ? ?Subjective:  has some abdominal soreness.  Denies any pain in his feet. ? ? ?Vitals:  ? 08/01/21 0407 08/01/21 0735  ?BP: 124/72 130/81  ?Pulse: 79 82  ?Resp: 20 16  ?Temp: 98.5 ?F (36.9 ?C)   ?SpO2: 99% 98%  ? ? ?Physical Exam: ?general:  standing at bedside with PT.  No distress ?Lungs:  non labored ?Extremities:  left foot is warm and well perfused ? ? ?CBC ?   ?Component Value Date/Time  ? WBC 7.5 07/28/2021 1139  ? RBC 5.62 07/28/2021 1139  ? HGB 17.3 (H) 07/28/2021 1139  ? HCT 48.5 07/28/2021 1139  ? PLT 200 07/28/2021 1139  ? MCV 86.3 07/28/2021 1139  ? MCH 30.8 07/28/2021 1139  ? MCHC 35.7 07/28/2021 1139  ? RDW 12.3 07/28/2021 1139  ? LYMPHSABS 3.0 11/12/2016 2100  ? MONOABS 0.8 11/12/2016 2100  ? EOSABS 0.3 11/12/2016 2100  ? BASOSABS 0.1 11/12/2016 2100  ? ? ?BMET ?   ?Component Value Date/Time  ? NA 140 07/28/2021 1139  ? NA 146 (H) 11/28/2020 1527  ? K 4.2 07/28/2021 1139  ? CL 107 07/28/2021 1139  ? CO2 26 07/28/2021 1139  ? GLUCOSE 120 (H) 07/28/2021 1139  ? BUN 14 07/28/2021 1139  ? BUN 21 11/28/2020 1527  ? CREATININE 0.92 07/28/2021 1139  ? CREATININE 0.80 03/26/2014 1041  ? CALCIUM 9.4 07/28/2021 1139  ? GFRNONAA >60 07/28/2021 1139  ? GFRNONAA >89 01/04/2014 1105  ? GFRAA >60 11/24/2016 0251  ? GFRAA >89 01/04/2014 1105  ? ? ?INR ?   ?Component Value Date/Time  ? INR 1.20 11/25/2016 0252  ? ? ? ?Intake/Output Summary (Last 24 hours) at 08/01/2021 0826 ?Last data filed at 08/01/2021 0415 ?Gross per 24 hour  ?Intake 2050 ml  ?Output 1400 ml  ?Net 650 ml  ? ? ? ?Assessment/Plan:  61 y.o. male is s/p:  ?S/p exposure for ALIF  ?1 Day Post-Op ? ? ?-pt doing well this morning.  His feet are warm and well perfused.   ?-pt walking with therapy with abdominal binder - denies any abdominal pain other than soreness.  ?-disposition per primary team.  F/u with Vascular surgery as needed. ? ? ?Doreatha Massed, PA-C ?[facility] ?[phone] ?08/01/2021 ?8:26 AM ? ?VASCULAR STAFF ADDENDUM: ?I have independently interviewed and examined the patient. ?I agree with the above.  ? ?Rande Brunt. Lenell Antu, MD ?[facility]  ?Office Phone Number: [phone] ?08/01/2021 11:51 AM ? ? ? ?

## 2021-08-01 NOTE — Evaluation (Signed)
Occupational Therapy Evaluation ?Patient Details ?Name: [name] [name] ?MRN: [id number] ?DOB: 07/31/1960 ?Today's Date: 08/01/2021 ? ? ?History of Present Illness Patient is a 61 y/o male who presents on 3/23 for ALIF and posterior fusion L5-S1 with abdominal exposure. PMH includes CABG, CAD, depression, fibromyalgia, MI, [name], DM.  ? ?Clinical Impression ?  ?Pt admitted for procedure listed above. PTA pt reported that he was independent with all ADL's and IADL's, including yard work. At this time, pt is limited by pain, requiring increased time and use of RW for stability. Pt is able to complete all BADL's following compensatory strategies. Pt has no further OT needs at this time and acute OT will sign off.  ?   ? ?Recommendations for follow up therapy are one component of a multi-disciplinary discharge planning process, led by the attending physician.  Recommendations may be updated based on patient status, additional functional criteria and insurance authorization.  ? ?Follow Up Recommendations ? No OT follow up  ?  ?Assistance Recommended at Discharge PRN  ?Patient can return home with the following A little help with bathing/dressing/bathroom;Assistance with cooking/housework ? ?  ?Functional Status Assessment ? Patient has had a recent decline in their functional status and demonstrates the ability to make significant improvements in function in a reasonable and predictable amount of time.  ?Equipment Recommendations ? None recommended by OT  ?  ?Recommendations for Other Services   ? ? ?  ?Precautions / Restrictions Precautions ?Precautions: Back ?Precaution Booklet Issued: Yes (comment) ?Precaution Comments: Reviewed handout and precautions ?Required Braces or Orthoses: Spinal Brace ?Spinal Brace: Applied in sitting position;Applied in standing position;Thoracolumbosacral orthotic ?Restrictions ?Weight Bearing Restrictions: No  ? ?  ? ?Mobility Bed Mobility ?  ?  ?  ?  ?  ?  ?  ?General bed mobility comments:  Sitting EOB on entry - reviewed log roll technique ?  ? ?Transfers ?Overall transfer level: Needs assistance ?Equipment used: Rolling walker (2 wheels) ?Transfers: Sit to/from Stand ?Sit to Stand: Supervision ?  ?  ?  ?  ?  ?General transfer comment: Supervision for safety. ?  ? ?  ?Balance Overall balance assessment: Needs assistance ?Sitting-balance support: Feet supported, No upper extremity supported ?Sitting balance-Leahy Scale: Good ?Sitting balance - Comments: Able to donn brace with setup and don./doff socks ?  ?Standing balance support: During functional activity ?Standing balance-Leahy Scale: Fair ?Standing balance comment: Able to stand statically without UE support; needs UE support for walks ?  ?  ?  ?  ?  ?  ?  ?  ?  ?  ?  ?   ? ?ADL either performed or assessed with clinical judgement  ? ?ADL Overall ADL's : Modified independent ?  ?  ?  ?  ?  ?  ?  ?  ?  ?  ?  ?  ?  ?  ?  ?  ?  ?  ?  ?General ADL Comments: With increased time and compensatory stratgies pt is able to complete all ADL's.  ? ? ? ?Vision Baseline Vision/History: 0 No visual deficits ?Ability to See in Adequate Light: 0 Adequate ?Patient Visual Report: No change from baseline ?Vision Assessment?: No apparent visual deficits  ?   ?Perception   ?  ?Praxis   ?  ? ?Pertinent Vitals/Pain Pain Assessment ?Pain Assessment: 0-10 ?Pain Score: 8  ?Pain Location: back/abdomen ?Pain Descriptors / Indicators: Sore, Operative site guarding ?Pain Intervention(s): Monitored during session, Repositioned  ? ? ? ?Hand Dominance  Right ?  ?Extremity/Trunk Assessment Upper Extremity Assessment ?Upper Extremity Assessment: Overall WFL for tasks assessed ?  ?Lower Extremity Assessment ?Lower Extremity Assessment: Defer to PT evaluation ?RLE Sensation: history of peripheral neuropathy ?LLE Sensation: history of peripheral neuropathy ?  ?Cervical / Trunk Assessment ?Cervical / Trunk Assessment: Back Surgery ?  ?Communication Communication ?Communication: No  difficulties ?  ?Cognition Arousal/Alertness: Awake/alert ?Behavior During Therapy: [facility] for tasks assessed/performed ?Overall Cognitive Status: Within Functional Limits for tasks assessed ?  ?  ?  ?  ?  ?  ?  ?  ?  ?  ?  ?  ?  ?  ?  ?  ?  ?  ?  ?General Comments  VSS on RA, dressing appears intact ? ?  ?Exercises   ?  ?Shoulder Instructions    ? ? ?Home Living Family/patient expects to be discharged to:: Private residence ?Living Arrangements: Alone ?Available Help at Discharge: Friend(s);Available 24 hours/day ?Type of Home: House ?Home Access: Stairs to enter ?Entrance Stairs-Number of Steps: 3 ?Entrance Stairs-Rails: None ?Home Layout: Two level;Able to live on main level with bedroom/bathroom ?  ?Alternate Level Stairs-Rails: Right ?Bathroom Shower/Tub: Walk-in shower ?  ?Bathroom Toilet: Standard ?  ?  ?Home Equipment: None ?  ?  ?  ? ?  ?Prior Functioning/Environment Prior Level of Function : Independent/Modified Independent ?  ?  ?  ?  ?  ?  ?Mobility Comments: Independent, drives. Loves to work outside and [employer] his lawn ?ADLs Comments: Indep ?  ? ?  ?  ?OT Problem List: Decreased strength;Impaired balance (sitting and/or standing);Decreased activity tolerance;Pain ?  ?   ?OT Treatment/Interventions:    ?  ?OT Goals(Current goals can be found in the care plan section) Acute Rehab OT Goals ?Patient Stated Goal: To go home ?OT Goal Formulation: With patient ?Time For Goal Achievement: 08/01/21 ?Potential to Achieve Goals: Good  ?OT Frequency:   ?  ? ?Co-evaluation   ?  ?  ?  ?  ? ?  ?AM-PAC OT "6 Clicks" Daily Activity     ?Outcome Measure Help from another person eating meals?: None ?Help from another person taking care of personal grooming?: None ?Help from another person toileting, which includes using toliet, bedpan, or urinal?: None ?Help from another person bathing (including washing, rinsing, drying)?: None ?Help from another person to put on and taking off regular upper body clothing?: None ?Help from  another person to put on and taking off regular lower body clothing?: None ?6 Click Score: 24 ?  ?End of Session Equipment Utilized During Treatment: Back brace;Rolling walker (2 wheels) ?Nurse Communication: Mobility status ? ?Activity Tolerance: Patient tolerated treatment well ?Patient left: in bed;with call bell/phone within reach ? ?OT Visit Diagnosis: Unsteadiness on feet (R26.81);Other abnormalities of gait and mobility (R26.89);Muscle weakness (generalized) (M62.81)  ?              ?Time: [id number] ?OT Time Calculation (min): 23 min ?Charges:  OT General Charges ?$OT Visit: 1 Visit ?OT Evaluation ?$OT Eval Moderate Complexity: 1 Mod ?OT Treatments ?$Self Care/Home Management : 8-22 mins ? ?Willett Lefeber H., OTR/L ?Acute Rehabilitation ? ?Tryone Kille Elane Yolanda Bonine ?08/01/2021, 10:24 AM ?

## 2021-08-01 NOTE — Progress Notes (Signed)
Patient alert and oriented, mae's well, voiding adequate amount of urine, swallowing without difficulty, no c/o pain at time of discharge. Patient discharged home with family. Script and discharged instructions given to patient. Patient and family stated understanding of instructions given. Patient has an appointment with Dr. Dumonski in 2 weeks 

## 2021-08-01 NOTE — Evaluation (Signed)
Physical Therapy Evaluation ?Patient Details ?Name: [name] [name] ?MRN: [id number] ?DOB: 21-Apr-1961 ?Today's Date: 08/01/2021 ? ?History of Present Illness ? Patient is a 61 y/o male who presents on 3/23 for ALIF and posterior fusion L5-S1 with abdominal exposure. PMH includes CABG, CAD, depression, fibromyalgia, MI, [name], DM.  ?Clinical Impression ? Patient presents with pain and post surgical deficits s/p above surgery. Pt lives at home alone and reports being independent for ADLs/IADLs PTA. Reports he will have his ex g/f staying with him at d/c for support. Tolerated bed mobility, transfers and gait training with supervision and use of RW for support. Requires Min A for stair training due to having no rails. Explained methods of using g/f for support as well as backwards with RW to navigate the steps. Pt reports he might have RW at home. Education re: back precautions, positioning, stairs, log roll technique etc. Pt does not require skilled therapy services as pt is functioning at supervision level and will have support at home. All education completed. Discharge from therapy.   ?   ? ?Recommendations for follow up therapy are one component of a multi-disciplinary discharge planning process, led by the attending physician.  Recommendations may be updated based on patient status, additional functional criteria and insurance authorization. ? ?Follow Up Recommendations No PT follow up ? ?  ?Assistance Recommended at Discharge Intermittent Supervision/Assistance  ?Patient can return home with the following ? Help with stairs or ramp for entrance;Assist for transportation;Assistance with cooking/housework ? ?  ?Equipment Recommendations None recommended by PT  ?Recommendations for Other Services ?    ?  ?Functional Status Assessment Patient has had a recent decline in their functional status and demonstrates the ability to make significant improvements in function in a reasonable and predictable amount of time.  ? ?   ?Precautions / Restrictions Precautions ?Precautions: Back ?Precaution Booklet Issued: Yes (comment) ?Precaution Comments: Reviewed handout and precautions ?Required Braces or Orthoses: Spinal Brace ?Spinal Brace: Applied in sitting position;Applied in standing position;Thoracolumbosacral orthotic ?Restrictions ?Weight Bearing Restrictions: No  ? ?  ? ?Mobility ? Bed Mobility ?Overal bed mobility: Needs Assistance ?Bed Mobility: Rolling, Sidelying to Sit ?Rolling: Supervision ?Sidelying to sit: Supervision ?  ?  ?  ?General bed mobility comments: HOB flat, use of rails to get to EOB, Cues for log roll technique. ?  ? ?Transfers ?Overall transfer level: Needs assistance ?Equipment used: Rolling walker (2 wheels) ?Transfers: Sit to/from Stand ?Sit to Stand: Supervision ?  ?  ?  ?  ?  ?General transfer comment: Supervision for safety. Stood from [employer], cues for hand placement/technique. ?  ? ?Ambulation/Gait ?Ambulation/Gait assistance: Supervision ?Gait Distance (Feet): 400 Feet ?Assistive device: Rolling walker (2 wheels) ?Gait Pattern/deviations: Step-through pattern, Decreased stride length ?  ?Gait velocity interpretation: <1.31 ft/sec, indicative of household ambulator ?  ?General Gait Details: Slow, mostly steady gait with increased pain post stairs. ? ?Stairs ?Stairs: Yes ?Stairs assistance: Min assist, Min guard ?Stair Management: Step to pattern, One rail Right ?Number of Stairs: 3 ?General stair comments: Cues for technique/safety, Discussed using RW backwards as well and how g.f can assist with stairs ? ?Wheelchair Mobility ?  ? ?Modified Rankin (Stroke Patients Only) ?  ? ?  ? ?Balance Overall balance assessment: Needs assistance ?Sitting-balance support: Feet supported, No upper extremity supported ?Sitting balance-Leahy Scale: Good ?Sitting balance - Comments: Able to donn brace with setup ?  ?Standing balance support: During functional activity ?Standing balance-Leahy Scale: Fair ?Standing balance  comment: Able to  stand statically without UE support; needs UE support for walks ?  ?  ?  ?  ?  ?  ?  ?  ?  ?  ?  ?   ? ? ? ?Pertinent Vitals/Pain Pain Assessment ?Pain Assessment: 0-10 ?Pain Score: 8  ?Pain Location: back/abdomen ?Pain Descriptors / Indicators: Sore, Operative site guarding ?Pain Intervention(s): Monitored during session, Repositioned, Premedicated before session  ? ? ?Home Living Family/patient expects to be discharged to:: Private residence ?Living Arrangements: Alone ?Available Help at Discharge: Friend(s);Available 24 hours/day (ex g/f) ?Type of Home: House ?Home Access: Stairs to enter ?Entrance Stairs-Rails: None ?Entrance Stairs-Number of Steps: 3 ?  ?Home Layout: Two level;Able to live on main level with bedroom/bathroom ?Home Equipment: None ?   ?  ?Prior Function Prior Level of Function : Independent/Modified Independent ?  ?  ?  ?  ?  ?  ?Mobility Comments: Independent, drives. Loves to work outside and [employer] his lawn ?  ?  ? ? ?Hand Dominance  ? Dominant Hand: Right ? ?  ?Extremity/Trunk Assessment  ? Upper Extremity Assessment ?Upper Extremity Assessment: Defer to OT evaluation ?  ? ?Lower Extremity Assessment ?Lower Extremity Assessment: Overall WFL for tasks assessed;RLE deficits/detail;LLE deficits/detail ?RLE Sensation: history of peripheral neuropathy ?LLE Sensation: history of peripheral neuropathy ?  ? ?Cervical / Trunk Assessment ?Cervical / Trunk Assessment: Back Surgery  ?Communication  ? Communication: No difficulties  ?Cognition Arousal/Alertness: Awake/alert ?Behavior During Therapy: [facility] for tasks assessed/performed ?Overall Cognitive Status: Within Functional Limits for tasks assessed ?  ?  ?  ?  ?  ?  ?  ?  ?  ?  ?  ?  ?  ?  ?  ?  ?  ?  ?  ? ?  ?General Comments   ? ?  ?Exercises    ? ?Assessment/Plan  ?  ?PT Assessment Patient does not need any further PT services  ?PT Problem List   ? ?   ?  ?PT Treatment Interventions     ? ?PT Goals (Current goals can be found in the  Care Plan section)  ?Acute Rehab PT Goals ?Patient Stated Goal: to go home ?PT Goal Formulation: All assessment and education complete, DC therapy ? ?  ?Frequency   ?  ? ? ?Co-evaluation   ?  ?  ?  ?  ? ? ?  ?AM-PAC PT "6 Clicks" Mobility  ?Outcome Measure Help needed turning from your back to your side while in a flat bed without using bedrails?: None ?Help needed moving from lying on your back to sitting on the side of a flat bed without using bedrails?: A Little ?Help needed moving to and from a bed to a chair (including a wheelchair)?: A Little ?Help needed standing up from a chair using your arms (e.g., wheelchair or bedside chair)?: A Little ?Help needed to walk in hospital room?: A Little ?Help needed climbing 3-5 steps with a railing? : A Little ?6 Click Score: 19 ? ?  ?End of Session Equipment Utilized During Treatment: Back brace ?Activity Tolerance: Patient tolerated treatment well ?Patient left: in bed;with call bell/phone within reach;Other (comment) (with PA present) ?Nurse Communication: Mobility status ?PT Visit Diagnosis: Pain;Other abnormalities of gait and mobility (R26.89) ?Pain - part of body:  (back/abdomen) ?  ? ?Time: 6270-3500 ?PT Time Calculation (min) (ACUTE ONLY): 26 min ? ? ?Charges:   PT Evaluation ?$PT Eval Moderate Complexity: 1 Mod ?PT Treatments ?$Gait Training: 8-22 mins ?  ?   ? ? ?  Vale HavenShauna H, PT, DPT ?Acute Rehabilitation Services ?Pager [phone] ?Office [phone] ? ? ? ? ?Blake DivineShauna A Danissa Rundle ?08/01/2021, 8:41 AM ? ?

## 2021-08-04 ENCOUNTER — Encounter (HOSPITAL_COMMUNITY): Payer: Self-pay | Admitting: Orthopedic Surgery

## 2021-08-04 MED FILL — Heparin Sodium (Porcine) Inj 1000 Unit/ML: INTRAMUSCULAR | Qty: 30 | Status: AC

## 2021-08-04 MED FILL — Sodium Chloride IV Soln 0.9%: INTRAVENOUS | Qty: 1000 | Status: AC

## 2021-08-06 ENCOUNTER — Other Ambulatory Visit (HOSPITAL_COMMUNITY): Payer: Self-pay

## 2021-08-06 MED ORDER — OXYCODONE-ACETAMINOPHEN 5-325 MG PO TABS
1.0000 | ORAL_TABLET | ORAL | 0 refills | Status: DC | PRN
  Filled 2021-08-06: qty 60, 7d supply, fill #0

## 2021-08-06 NOTE — Discharge Summary (Signed)
? ? ?Patient ID: ?[name] [name] ?MRN: [id number] ?DOB/AGE: [birth year] 62 y.o. ? ?Admit date: 07/31/2021 ?Discharge date: 08/01/2021 ? ?Admission Diagnoses:  ?Principal Problem: ?  Radiculopathy ? ? ?Discharge Diagnoses:  ?Same ? ?Past Medical History:  ?Diagnosis Date  ? Anxiety   ? Chronic lower back pain   ? Coronary artery disease 03/2004  ? 3 stents put in   ? Depression   ? Fibromyalgia   ? GERD (gastroesophageal reflux disease)   ? pt denies d/t weight loss  ? History of hiatal hernia   ? states no longer there per endoscopy  ? Hyperlipidemia LDL goal <70   ? Hypertension   ? Left leg pain   ? Low back pain   ? Migraine   ? "weekly; back in the 1990s" (11/12/2016)  ? Myocardial infarction [facility]) 2005  ? Neuroforaminal stenosis of lumbar spine   ? L5-S1  ? [name] (obstructive sleep apnea)   ? "quit wearing my mask" (11/12/2016)  ? Spondylolisthesis, grade 2   ? Type II diabetes mellitus (HCC) 2010  ? ? ?Surgeries: Procedure(s): ?ANTERIOR LUMBAR INTERBODY FUSION LUMBAR 5 - SACRUM 1 WITH INSTRUMENTATION AND ALLOGRAFT ?POSTERIOR DECOMPRESSION AND FUSION LUMBAR 5- SACRUM 1 WITH INSTRUMENTATION AND ALLOGRAFT ?ABDOMINAL EXPOSURE on 07/31/2021 ?  ?Consultants: Treatment Team:  ?Leonie Douglas, MD ? ?Discharged Condition: Improved ? ?Hospital Course: [name] [name] is an 61 y.o. male who was admitted 07/31/2021 for operative treatment of Radiculopathy. Patient has severe unremitting pain that affects sleep, daily activities, and work/hobbies. After pre-op clearance the patient was taken to the operating room on 07/31/2021 and underwent  Procedure(s): ?ANTERIOR LUMBAR INTERBODY FUSION LUMBAR 5 - SACRUM 1 WITH INSTRUMENTATION AND ALLOGRAFT ?POSTERIOR DECOMPRESSION AND FUSION LUMBAR 5- SACRUM 1 WITH INSTRUMENTATION AND ALLOGRAFT ?ABDOMINAL EXPOSURE.   ? ?Patient was given perioperative antibiotics:  ?Anti-infectives (From admission, onward)  ? ? Start     Dose/Rate Route Frequency Ordered Stop  ? 07/31/21 2000  ceFAZolin (ANCEF)  IVPB 2g/100 mL premix       ? 2 g ?200 mL/hr over 30 Minutes Intravenous Every 8 hours 07/31/21 1705 08/01/21 0340  ? 07/31/21 0615  ceFAZolin (ANCEF) IVPB 2g/100 mL premix       ? 2 g ?200 mL/hr over 30 Minutes Intravenous On call to O.R. 07/31/21 0600 07/31/21 1236  ? ?  ?  ? ?Patient was given sequential compression devices, early ambulation to prevent DVT. ? ?Patient benefited maximally from hospital stay and there were no complications.   ? ?Recent vital signs: BP 130/81 (BP Location: Right Arm)   Pulse 82   Temp 98.5 ?F (36.9 ?C) (Oral)   Resp 16   Ht 5\' 11"  (1.803 m)   Wt 96.6 kg   SpO2 98%   BMI 29.71 kg/m?   ? ?Discharge Medications:   ?Allergies as of 08/01/2021   ?No Known Allergies ?  ? ?  ?Medication List  ?  ? ?TAKE these medications   ? ?amitriptyline 25 MG tablet ?Commonly known as: ELAVIL ?Take 25 mg by mouth at bedtime. ?  ?aspirin EC 81 MG tablet ?Take 81 mg by mouth daily. ?  ?Dexcom G6 Transmitter Misc ?Change every 10 days as needed/directed ?  ?gabapentin 600 MG tablet ?Commonly known as: NEURONTIN ?Take 1,200 mg by mouth every 12 (twelve) hours. ?  ?HumaLOG KwikPen 200 UNIT/ML KwikPen ?Generic drug: insulin lispro ?Inject 20 Units into the skin 5 (five) times daily as needed (blood sugar over 200). ?  ?  insulin lispro 100 UNIT/ML injection ?Commonly known as: HUMALOG ?MAX DAILY 100 UNITS, this is correct dose in vials and not pens ?  ?Insulin Pen Needle 29G X 5MM Misc ?1 Device by Does not apply route in the morning, at noon, in the evening, and at bedtime. ?  ?lisinopril 5 MG tablet ?Commonly known as: ZESTRIL ?Take 10 mg by mouth daily. ?  ?metFORMIN 500 MG 24 hr tablet ?Commonly known as: GLUCOPHAGE-XR ?Take 500-1,000 mg by mouth See admin instructions. Take 1000 mg in the morning and 500 mg in the evening ?  ?methocarbamol 500 MG tablet ?Commonly known as: ROBAXIN ?Take 1-2 tablets (500-1,000 mg total) by mouth every 6 (six) hours as needed for muscle spasms. ?  ?metoprolol tartrate  25 MG tablet ?Commonly known as: LOPRESSOR ?Take 1 tablet (25 mg total) by mouth 2 (two) times daily. ?  ?OneTouch Delica Lancets 30G Misc ?1 each by Does not apply route in the morning, at noon, in the evening, and at bedtime. Use Onetouch Delica lancets to check blood sugar 3-4 times daily. ?  ?OneTouch Ultra test strip ?Generic drug: glucose blood ?1 EACH BY OTHER ROUTE 3 (THREE) TIMES DAILY. USE ONETOUCH ULTRA TEST STRIPS AS INSTRUCTED TO CHECK BLOOD SUGAR 3-4 TIMES DAILY. ?  ?oxyCODONE-acetaminophen 5-325 MG tablet ?Commonly known as: PERCOCET/ROXICET ?Take 1-2 tablets by mouth every 4 (four) hours as needed for severe pain. ?  ?rosuvastatin 40 MG tablet ?Commonly known as: CRESTOR ?Take 1 tablet (40 mg total) by mouth daily. ?  ? ?  ? ? ?Diagnostic Studies: DG Lumbar Spine 2-3 Views ? ?Result Date: 07/31/2021 ?CLINICAL DATA:  L5-S1 posterior spinal fusion. EXAM: LUMBAR SPINE - 2-3 VIEW; DG C-ARM 1-60 MIN-NO REPORT COMPARISON:  Lumbar spine radiographs-05/01/2021 Fluoroscopy time: 2 minutes, 38 seconds (131.8 mGy) FINDINGS: Two spot intraoperative fluoroscopic images of the lower lumbar spine are provided for review. Spinal labeling is in keeping with preprocedural lumbar spine radiographs. Provided images demonstrate the sequela of L5-S1 paraspinal fusion and intervertebral disc space replacement. There is improved though persistent anterolisthesis of L5 upon S1. No evidence of hardware failure or loosening. No radiopaque foreign body. IMPRESSION: Post L5-S1 paraspinal fusion and intervertebral disc space replacement without evidence of complication. Electronically Signed   By: Simonne ComeJohn  Watts M.D.   On: 07/31/2021 12:42  ? ?DG C-Arm 1-60 Min-No Report ? ?Result Date: 07/31/2021 ?Fluoroscopy was utilized by the requesting physician.  No radiographic interpretation.  ? ?DG C-Arm 1-60 Min-No Report ? ?Result Date: 07/31/2021 ?Fluoroscopy was utilized by the requesting physician.  No radiographic interpretation.  ? ?DG  C-Arm 1-60 Min-No Report ? ?Result Date: 07/31/2021 ?Fluoroscopy was utilized by the requesting physician.  No radiographic interpretation.  ? ?DG C-Arm 1-60 Min-No Report ? ?Result Date: 07/31/2021 ?Fluoroscopy was utilized by the requesting physician.  No radiographic interpretation.  ? ?DG OR LOCAL ABDOMEN ? ?Result Date: 07/31/2021 ?CLINICAL DATA:  ALIF L5-S1.  Rule out unintentional foreign body. EXAM: OR LOCAL ABDOMEN COMPARISON:  Lumbar radiographs 05/01/2021 FINDINGS: Metal disc prosthesis overlying the L5-S1 disc space in satisfactory position. No other radiopaque foreign body identified. No surgical instruments in the field. IMPRESSION: Metallic disc prosthesis L5-S1. These results were called by telephone at the time of interpretation on 07/31/2021 at 11:27 am to provider Shawna OrleansMelanie , who verbally acknowledged these results. Electronically Signed   By: Marlan Palauharles  Clark M.D.   On: 07/31/2021 11:28   ? ?Disposition: Discharge disposition: 01-Home or Self Care ? ? ? ? ? ? ?  Discharge Instructions   ? ? Discharge patient   Complete by: As directed ?  ? Discharge disposition: 01-Home or Self Care  ? Discharge patient date: 08/01/2021  ? ?  ? ? ?POD #1 s/p L5/S1 ANT/POST fusion for BIL radiculopathy, substantial improvement  ?  ?- up with PT/OT, encourage ambulation ?- Percocet for pain, Robaxin for muscle spasms ?            -Sent to pharm electronically  ?-D/C instructions sheet printed and in chart ?-D/C today  ?-F/U in office 2 weeks  ? ?Signed: ?Eilene Ghazi Dixie Jafri ?08/06/2021, 11:46 AM ? ? ? ? ? ?  ?

## 2021-09-05 ENCOUNTER — Encounter: Payer: Self-pay | Admitting: Internal Medicine

## 2021-09-08 ENCOUNTER — Other Ambulatory Visit: Payer: Self-pay

## 2021-09-08 MED ORDER — LANTUS SOLOSTAR 100 UNIT/ML ~~LOC~~ SOPN: 40.0000 [IU] | PEN_INJECTOR | Freq: Every day | SUBCUTANEOUS | 1 refills | Status: DC

## 2021-10-20 ENCOUNTER — Other Ambulatory Visit: Payer: Self-pay | Admitting: Family Medicine

## 2021-10-20 DIAGNOSIS — H9313 Tinnitus, bilateral: Secondary | ICD-10-CM

## 2021-11-01 ENCOUNTER — Ambulatory Visit
Admission: RE | Admit: 2021-11-01 | Discharge: 2021-11-01 | Disposition: A | Discharge status: Home / Self Care | Payer: Medicare Other | Source: Ambulatory Visit | Attending: Family Medicine | Admitting: Family Medicine

## 2021-11-01 DIAGNOSIS — H9313 Tinnitus, bilateral: Secondary | ICD-10-CM

## 2021-12-02 ENCOUNTER — Other Ambulatory Visit (HOSPITAL_COMMUNITY): Payer: Medicare Other

## 2021-12-05 ENCOUNTER — Encounter: Payer: Self-pay | Admitting: Internal Medicine

## 2021-12-05 DIAGNOSIS — E1165 Type 2 diabetes mellitus with hyperglycemia: Secondary | ICD-10-CM

## 2021-12-10 MED ORDER — LANTUS SOLOSTAR 100 UNIT/ML ~~LOC~~ SOPN: 40.0000 [IU] | PEN_INJECTOR | Freq: Every day | SUBCUTANEOUS | 1 refills | Status: DC

## 2022-01-19 ENCOUNTER — Other Ambulatory Visit: Payer: Self-pay

## 2022-01-19 DIAGNOSIS — E1169 Type 2 diabetes mellitus with other specified complication: Secondary | ICD-10-CM

## 2022-01-19 MED ORDER — ROSUVASTATIN CALCIUM 40 MG PO TABS: 40.0000 mg | ORAL_TABLET | Freq: Every day | ORAL | 3 refills | Status: DC

## 2022-01-21 DIAGNOSIS — M79605 Pain in left leg: Secondary | ICD-10-CM | POA: Insufficient documentation

## 2022-01-21 DIAGNOSIS — M48061 Spinal stenosis, lumbar region without neurogenic claudication: Secondary | ICD-10-CM | POA: Insufficient documentation

## 2022-01-21 DIAGNOSIS — M4310 Spondylolisthesis, site unspecified: Secondary | ICD-10-CM | POA: Insufficient documentation

## 2022-01-21 NOTE — Progress Notes (Signed)
Cardiology Office Note:    Date:  01/22/2022   ID:  [name] [name], DOB 23-Aug-1960, MRN [id number]  PCP:  Kaleen Mask, MD  Cardiologist:  Norman Herrlich, MD    Referring MD: Kaleen Mask, *    ASSESSMENT:    1. CAD in native artery   2. Essential hypertension   3. Dyslipidemia   4. Type 2 diabetes mellitus with diabetic polyneuropathy, without long-term current use of insulin (HCC)    PLAN:    In order of problems listed above:  Con continues to do well following CABG he is having no angina on current medical therapy including his aspirin beta-blocker and high intensity statin continue the same Stable BP at target continue his ACE inhibitor effective in controlling hypertension And continue his high intensity statin with CAD requested lipids from his PCP not available in epic Diabetes improved A1c approaching target   Next appointment: 1 year   Medication Adjustments/Labs and Tests Ordered: Current medicines are reviewed at length with the patient today.  Concerns regarding medicines are outlined above.  No orders of the defined types were placed in this encounter.  No orders of the defined types were placed in this encounter.   Chief complaint follow-up for CAD   History of Present Illness:    [name] [name] is a 61 y.o. male with a hx of coronary artery disease with remote PCI and stent 2005, subsequent presentation July 2018 with ACS severe three-vessel coronary artery disease and CABG with left thoracic artery anastomosed to the LAD and vein graft to the ramus obtuse marginal and sequentially to PDA and posterior lateral branch type 2 diabetes dyslipidemia and hypertension last seen 11/28/2020.  Compliance with diet, lifestyle and medications: Yes  Patient doing much better he has his A1c close to 7 with continuous glucose monitor his back pain is resolved after surgery along with neuropathy he is more active no angina dyspnea palpitation or  syncope. He tells me his lipids are at target requested from his PCP office No muscle pain or weakness Past Medical History:  Diagnosis Date   Anxiety    Chronic lower back pain    Coronary artery disease 03/2004   3 stents put in    Depression    Fibromyalgia    GERD (gastroesophageal reflux disease)    pt denies d/t weight loss   History of hiatal hernia    states no longer there per endoscopy   Hyperlipidemia LDL goal <70    Hypertension    Left leg pain    Low back pain    Migraine    "weekly; back in the 1990s" (11/12/2016)   Myocardial infarction (HCC) 2005   Neuroforaminal stenosis of lumbar spine    L5-S1   [name] (obstructive sleep apnea)    "quit wearing my mask" (11/12/2016)   Spondylolisthesis, grade 2    Type II diabetes mellitus (HCC) 2010    Past Surgical History:  Procedure Laterality Date   ABDOMINAL EXPOSURE N/A 07/31/2021   Procedure: ABDOMINAL EXPOSURE;  Surgeon: Leonie Douglas, MD;  Location: [facility] OR;  Service: Vascular;  Laterality: N/A;   ANTERIOR LUMBAR FUSION N/A 07/31/2021   Procedure: ANTERIOR LUMBAR INTERBODY FUSION LUMBAR 5 - SACRUM 1 WITH INSTRUMENTATION AND ALLOGRAFT;  Surgeon: Estill Bamberg, MD;  Location: [facility] OR;  Service: Orthopedics;  Laterality: N/A;   CARDIAC CATHETERIZATION  11/12/2016   CORONARY ANGIOPLASTY WITH STENT PLACEMENT  03/2004    x 3 stents  CORONARY ARTERY BYPASS GRAFT N/A 11/17/2016   Procedure: CORONARY ARTERY BYPASS GRAFTING (CABG) x 5, USING RIGHT INTERNAL MAMMARY ARTERY AND RIGHT GREATER SAPHENOUS VEIN HARVESTED ENDOSCOPICALLY;  Surgeon: Kerin Perna, MD;  Location: [facility];  Service: Open Heart Surgery;  Laterality: N/A;   INGUINAL HERNIA REPAIR Right 2000   LEFT HEART CATH AND CORONARY ANGIOGRAPHY N/A 11/12/2016   Procedure: Left Heart Cath and Coronary Angiography;  Surgeon: Runell Gess, MD;  Location: [facility] INVASIVE CV LAB;  Service: Cardiovascular;  Laterality: N/A;   TEE WITHOUT CARDIOVERSION N/A 11/17/2016    Procedure: TRANSESOPHAGEAL ECHOCARDIOGRAM (TEE);  Surgeon: Donata Clay, Theron Arista, MD;  Location: [facility] OR;  Service: Open Heart Surgery;  Laterality: N/A;    Current Medications: Current Meds  Medication Sig   amitriptyline (ELAVIL) 25 MG tablet Take 25 mg by mouth at bedtime.   aspirin EC 81 MG tablet Take 81 mg by mouth daily.   insulin glargine (LANTUS SOLOSTAR) 100 UNIT/ML Solostar Pen Inject 40 Units into the skin daily.   insulin lispro (HUMALOG KWIKPEN) 200 UNIT/ML KwikPen Inject 20 Units into the skin 5 (five) times daily as needed (blood sugar over 200).   insulin lispro (HUMALOG) 100 UNIT/ML injection MAX DAILY 100 UNITS, this is correct dose in vials and not pens   lisinopril (ZESTRIL) 10 MG tablet Take 10 mg by mouth daily.   meloxicam (MOBIC) 15 MG tablet Take 15 mg by mouth daily.   metFORMIN (GLUCOPHAGE-XR) 500 MG 24 hr tablet Take 500-1,000 mg by mouth See admin instructions. Take 1000 mg in the morning and 500 mg in the evening   methocarbamol (ROBAXIN) 500 MG tablet Take 1-2 tablets (500-1,000 mg total) by mouth every 6 (six) hours as needed for muscle spasms.   metoprolol tartrate (LOPRESSOR) 25 MG tablet Take 1 tablet (25 mg total) by mouth 2 (two) times daily.   rosuvastatin (CRESTOR) 40 MG tablet Take 1 tablet (40 mg total) by mouth daily.     Allergies:   Patient has no known allergies.   Social History   Socioeconomic History   Marital status: Divorced    Spouse name: Not on file   Number of children: 1    Years of education: 12    Highest education level: Not on file  Occupational History   Occupation: Unemployed   Tobacco Use   Smoking status: Never   Smokeless tobacco: Never  Vaping Use   Vaping Use: Never used  Substance and Sexual Activity   Alcohol use: Yes    Alcohol/week: 2.0 standard drinks of alcohol    Types: 2 Cans of beer per week   Drug use: Yes    Types: Marijuana    Comment: once a month- smoking marijuana   Sexual activity: Never  Other  Topics Concern   Not on file  Social History Narrative   Lives alone.    Daughter lives nearby,5 miles away   Sister next door.    1 grandchild, 30 yo granddaughter    Social Determinants of Corporate investment banker Strain: Not on file  Food Insecurity: Not on file  Transportation Needs: Not on file  Physical Activity: Not on file  Stress: Not on file  Social Connections: Not on file     Family History: The patient's family history includes Diabetes in his father, paternal grandfather, and sister. There is no history of Cancer. ROS:   Please see the history of present illness.    All other systems reviewed  and are negative.  EKGs/Labs/Other Studies Reviewed:    The following studies were reviewed today:  EKG:  EKG ordered today and personally reviewed.  The ekg ordered today demonstrates sinus rhythm incomplete right bundle branch block old inferior anterolateral MI similar to July 2022  Recent Labs: 07/28/2021: BUN 14; Creatinine, Ser 0.92; Hemoglobin 17.3; Platelets 200; Potassium 4.2; Sodium 140  Recent Lipid Panel    Component Value Date/Time   CHOL 123 11/28/2020 1527   TRIG 252 (H) 11/28/2020 1527   HDL 30 (L) 11/28/2020 1527   CHOLHDL 4.1 11/28/2020 1527   CHOLHDL 4.3 11/13/2016 0359   VLDL 49 (H) 11/13/2016 0359   LDLCALC 53 11/28/2020 1527    Physical Exam:    VS:  BP 104/65   Pulse 81   Ht 5\' 11"  (1.803 m)   Wt 226 lb 3.2 oz (102.6 kg)   SpO2 95%   BMI 31.55 kg/m     Wt Readings from Last 3 Encounters:  01/22/22 226 lb 3.2 oz (102.6 kg)  07/31/21 213 lb (96.6 kg)  07/28/21 213 lb 6.4 oz (96.8 kg)     GEN:  Well nourished, well developed in no acute distress HEENT: Normal NECK: No JVD; No carotid bruits LYMPHATICS: No lymphadenopathy CARDIAC: RRR, no murmurs, rubs, gallops RESPIRATORY:  Clear to auscultation without rales, wheezing or rhonchi  ABDOMEN: Soft, non-tender, non-distended MUSCULOSKELETAL:  No edema; No deformity  SKIN: Warm and  dry NEUROLOGIC:  Alert and oriented x 3 PSYCHIATRIC:  Normal affect    Signed, 07/30/21, MD  01/22/2022 1:47 PM    [facility]

## 2022-01-22 ENCOUNTER — Encounter: Payer: Self-pay | Admitting: Cardiology

## 2022-01-22 ENCOUNTER — Ambulatory Visit: Payer: Medicare Other | Attending: Cardiology | Admitting: Cardiology

## 2022-01-22 VITALS — BP 104/65 | HR 81 | Ht 71.0 in | Wt 226.2 lb

## 2022-01-22 DIAGNOSIS — E785 Hyperlipidemia, unspecified: Secondary | ICD-10-CM | POA: Diagnosis not present

## 2022-01-22 DIAGNOSIS — I2510 Atherosclerotic heart disease of native coronary artery without angina pectoris: Secondary | ICD-10-CM | POA: Diagnosis not present

## 2022-01-22 DIAGNOSIS — E1142 Type 2 diabetes mellitus with diabetic polyneuropathy: Secondary | ICD-10-CM | POA: Diagnosis not present

## 2022-01-22 DIAGNOSIS — I10 Essential (primary) hypertension: Secondary | ICD-10-CM | POA: Diagnosis not present

## 2022-01-22 NOTE — Patient Instructions (Signed)
Medication Instructions:  Your physician recommends that you continue on your current medications as directed. Please refer to the Current Medication list given to you today.  *If you need a refill on your cardiac medications before your next appointment, please call your pharmacy*   Lab Work: None If you have labs (blood work) drawn today and your tests are completely normal, you will receive your results only by: MyChart Message (if you have MyChart) OR A paper copy in the mail If you have any lab test that is abnormal or we need to change your treatment, we will call you to review the results.   Testing/Procedures: None   Follow-Up: At [facility], you and your health needs are our priority.  As part of our continuing mission to provide you with exceptional heart care, we have created designated Provider Care Teams.  These Care Teams include your primary Cardiologist (physician) and Advanced Practice Providers (APPs -  Physician Assistants and Nurse Practitioners) who all work together to provide you with the care you need, when you need it.  We recommend signing up for the patient portal called "MyChart".  Sign up information is provided on this After Visit Summary.  MyChart is used to connect with patients for Virtual Visits (Telemedicine).  Patients are able to view lab/test results, encounter notes, upcoming appointments, etc.  Non-urgent messages can be sent to your provider as well.   To learn more about what you can do with MyChart, go to [email or web address].    Your next appointment:   1 year(s)  The format for your next appointment:   In Person  Provider:   Brian Munley, MD    Other Instructions None  Important Information About Sugar       

## 2022-02-10 ENCOUNTER — Telehealth: Payer: Self-pay

## 2022-02-10 NOTE — Telephone Encounter (Signed)
Please contact patient to schedule appointment . He states that he keeps trying to call and the phone is hanging up.

## 2022-02-11 NOTE — Telephone Encounter (Signed)
Attempted to call patient but no answer.  Left message to call office to schedule.

## 2022-04-07 ENCOUNTER — Telehealth: Payer: Self-pay

## 2022-04-07 NOTE — Telephone Encounter (Signed)
LVM to schedule appointment.

## 2022-04-07 NOTE — Telephone Encounter (Signed)
Can we attempt to contact patient again as we are receiving paperwork from [employer]  that needs updated office notes.

## 2022-04-10 ENCOUNTER — Other Ambulatory Visit: Payer: Self-pay | Admitting: Internal Medicine

## 2022-04-27 ENCOUNTER — Encounter: Payer: Self-pay | Admitting: Internal Medicine

## 2022-04-28 ENCOUNTER — Telehealth: Payer: Self-pay

## 2022-04-28 NOTE — Telephone Encounter (Signed)
If his sugars are at goal in the morning but they increase throughout the day, he may need to increase the dose of Humalog from 22 to 25-28 before meals.  The dose needs to be adjusted by the size of the meals. Aggressive behavior is not tolerated.  Agree with sending the message to [employer].

## 2022-04-28 NOTE — Telephone Encounter (Addendum)
Pt contacted and advised If his sugars are at goal in the morning but they increase throughout the day, he may need to increase the dose of Humalog from 22 to 25-28 before meals.  The dose needs to be adjusted by the size of the meals.  Pt advised today he took 22 units this morning at 8 am, at 11 am he took an additional 20 units and at 2:30 pm he took an additional 20 units. Pt says this was all taken with no food to bring his sugars down and they have maintained at 300. I asked pt if he has had any steroid injection or recent rx that may be causing high blood sugars and pt advised he had surgery back in February but nothing since. Pt has Dexcom CGM but is not connected to clinic. Pt has been confirming high blood sugars with finger stick and they have been no more then 5 points off. Pt has been on Humalog for 2 years and at the time of call his blood sugar was 291. Pt states there have been days his blood sugar will shoot up from 120 to 220 first thing in the morning before he eats. Pt is very confused on what to do to manage his blood sugars and is worried about them maintaining in the 300's for so long.

## 2022-04-28 NOTE — Telephone Encounter (Signed)
Patient states that his blood sugar are up and down. Patient states he has been taking Lantus 40 units  daily and he has been doing about 22 units of the Humalog.  Patient states that his BS is 305 right now and it was 150 this morning. Patient states that he has been dealing with this for years. Patient is very frustrated because he is not happy with any of the services that have been provided. Patient very verbally abuse as he was speaking with me. I will also send a message to [facility] to contact patient as well.

## 2022-04-29 NOTE — Telephone Encounter (Signed)
There might be something else going on, may be an infection or inflammation.  However, for now, we have to increase the insulin enough to cover these hyperglycemic peaks.  If the sugars are at goal in the morning and increase throughout the day, this is a sign of insulin resistance and not enough mealtime coverage, so please advise him to increase the dose of Humalog before meals as needed to cover these peaks.  Also, please stay very well-hydrated and eat a low-salt diet since dehydration and high sodium can increase insulin resistance.  Light exercise should also greatly help -he can try to walk for 10 minutes after every meal, for example, at least for 1-2 weeks and see how this goes.

## 2022-04-29 NOTE — Telephone Encounter (Signed)
Patient advised and verbalized understanding 

## 2022-05-18 ENCOUNTER — Other Ambulatory Visit: Payer: Self-pay | Admitting: Orthopedic Surgery

## 2022-05-18 DIAGNOSIS — K469 Unspecified abdominal hernia without obstruction or gangrene: Secondary | ICD-10-CM

## 2022-05-21 ENCOUNTER — Ambulatory Visit
Admission: RE | Admit: 2022-05-21 | Discharge: 2022-05-21 | Disposition: A | Discharge status: Home / Self Care | Payer: Medicare Other | Source: Ambulatory Visit | Attending: Orthopedic Surgery | Admitting: Orthopedic Surgery

## 2022-05-21 DIAGNOSIS — K469 Unspecified abdominal hernia without obstruction or gangrene: Secondary | ICD-10-CM

## 2022-05-27 ENCOUNTER — Other Ambulatory Visit: Payer: Self-pay

## 2022-06-09 ENCOUNTER — Other Ambulatory Visit: Payer: Self-pay | Admitting: Surgery

## 2022-09-11 LAB — LAB REPORT - SCANNED
A1c: 8.3
Albumin, Urine POC: 26.9
Albumin/Creatinine Ratio, Urine, POC: 29
Creatinine, Urine.: 93
EGFR: 1.1

## 2022-09-23 ENCOUNTER — Ambulatory Visit: Payer: Medicare Other | Admitting: Internal Medicine

## 2022-09-23 ENCOUNTER — Encounter: Payer: Self-pay | Admitting: Internal Medicine

## 2022-09-23 VITALS — BP 110/72 | HR 65 | Ht 71.0 in | Wt 232.0 lb

## 2022-09-23 DIAGNOSIS — Z794 Long term (current) use of insulin: Secondary | ICD-10-CM

## 2022-09-23 DIAGNOSIS — E1159 Type 2 diabetes mellitus with other circulatory complications: Secondary | ICD-10-CM | POA: Diagnosis not present

## 2022-09-23 DIAGNOSIS — E1142 Type 2 diabetes mellitus with diabetic polyneuropathy: Secondary | ICD-10-CM | POA: Diagnosis not present

## 2022-09-23 DIAGNOSIS — E1165 Type 2 diabetes mellitus with hyperglycemia: Secondary | ICD-10-CM | POA: Diagnosis not present

## 2022-09-23 MED ORDER — INSULIN LISPRO 100 UNIT/ML ~~LOC~~ SOLN: SUBCUTANEOUS | 0 refills | Status: DC

## 2022-09-23 MED ORDER — HUMALOG KWIKPEN 200 UNIT/ML ~~LOC~~ SOPN: PEN_INJECTOR | SUBCUTANEOUS | 2 refills | Status: AC

## 2022-09-23 MED ORDER — LANTUS SOLOSTAR 100 UNIT/ML ~~LOC~~ SOPN: 70.0000 [IU] | PEN_INJECTOR | Freq: Every day | SUBCUTANEOUS | 3 refills | Status: DC

## 2022-09-23 NOTE — Patient Instructions (Addendum)
-   Increase  Lantus 70 units daily  - Continue Humalog 25 units with each meal   - Humalog correctional insulin: ADD extra units on insulin to your meal-time Humalog dose if your blood sugars are higher than 150. Use the scale below to help guide you:   Blood sugar before meal Number of units to inject  Less than 145 0 unit  146 - 160 1 units  161 - 175 2 units  176 - 190 3 units  191 - 205 4 units  206 - 220 5 units  221 - 235 6 units  236 - 250 7 units  251 - 265 8 units  266 - 280 9 units   281 - 295 10 units        HOW TO TREAT LOW BLOOD SUGARS (Blood sugar LESS THAN 70 MG/DL) Please follow the RULE OF 15 for the treatment of hypoglycemia treatment (when your (blood sugars are less than 70 mg/dL)   STEP 1: Take 15 grams of carbohydrates when your blood sugar is low, which includes:  3-4 GLUCOSE TABS  OR 3-4 OZ OF JUICE OR REGULAR SODA OR ONE TUBE OF GLUCOSE GEL    STEP 2: RECHECK blood sugar in 15 MINUTES STEP 3: If your blood sugar is still low at the 15 minute recheck --> then, go back to STEP 1 and treat AGAIN with another 15 grams of carbohydrates.

## 2022-09-23 NOTE — Progress Notes (Addendum)
Name: [name] [name]  Age/ Sex: 62 y.o., male   MRN/ DOB: [id number], 06-18-1960     PCP: Kaleen Mask, MD   Reason for Endocrinology Evaluation: Type 2 Diabetes Mellitus  Initial Endocrine Consultative Visit: 10/31/2019    PATIENT IDENTIFIER: [name] [name] is a 62 y.o. male with a past medical history of T2Dm, [name], HTN, Dyslipidemia and CAD (S/P stents 2005 and CABG 2018). The patient has followed with Endocrinology clinic since 10/31/2019 for consultative assistance with management of his diabetes.  DIABETIC HISTORY:  [name] [name] was diagnosed with DM in 2015. Metformin caused massive diarrhea . His hemoglobin A1c has ranged from 8.9% in 2016, peaking at 13.7% in 2015.  On disability for heart and back issues    On his initial visit to our clinic he had an A1c 13.9 % . He was not on any medications by that time, we started MDI regimen.    Attempted to restart metformin XR but stopped by 05/2020 due to diarrhea   GAD -65 and Iselt cell antibody levels were undetectable.   Sister with T1DM   Marcelline Deist cost prohibitive   Stopped Metformin 2023 due to GI side effects with diarrhea   SUBJECTIVE:   During the last visit (07/28/2021): A1c 9.6 %  Today (09/23/2022): [name] [name] is here for a follow up on diabetes management.  The patient has  NOT been to our clinic in 14 months.  He checks his blood sugars multiple times a day through Dexcom. The patient has not had hypoglycemic episodes since the last clinic visit.   The patient has chronic intermittent nausea and vomiting Denies diarrhea or constipation  He is depressed, saw his PCP 3 weeks ago and was prescribed antidepressant, patient has not been to the pharmacy to pick it up yet  HOME DIABETES REGIMEN:  Lantus 60 units daily  Humalog 25 units with each meal  CF : Humalog (BG -130/25)     Statin: Yes ACE-I/ARB: was prescribed but not taken     CONTINUOUS GLUCOSE MONITORING RECORD  INTERPRETATION :     Dates of Recording: 5/2 - 09/23/2022  Sensor description:dexcom   Results statistics:   CGM use % of time 100  Average and SD 214/67  Time in range 33 %  % Time Above 180 40  % Time above 250 27  % Time Below target 0   Glycemic patterns summary: Hyperglycemia noted during the day and during the night Hyperglycemic episodes   all day and night   Hypoglycemic episodes occurred n/a  Overnight periods: Remains high       DIABETIC COMPLICATIONS: Microvascular complications:  Mild retinopathy , neuropathy  Denies: CKD Last eye exam: Completed 2024    Macrovascular complications:  CAD (S/p CABG 2018) Denies: PVD, CVA    HISTORY:  Past Medical History:  Past Medical History:  Diagnosis Date   Anxiety    Chronic lower back pain    Coronary artery disease 03/2004   3 stents put in    Depression    Fibromyalgia    GERD (gastroesophageal reflux disease)    pt denies d/t weight loss   History of hiatal hernia    states no longer there per endoscopy   Hyperlipidemia LDL goal <70    Hypertension    Left leg pain    Low back pain    Migraine    "weekly; back in the 1990s" (11/12/2016)   Myocardial infarction (HCC) 2005   Neuroforaminal stenosis  of lumbar spine    L5-S1   [name] (obstructive sleep apnea)    "quit wearing my mask" (11/12/2016)   Spondylolisthesis, grade 2    Type II diabetes mellitus (HCC) 2010   Past Surgical History:  Past Surgical History:  Procedure Laterality Date   ABDOMINAL EXPOSURE N/A 07/31/2021   Procedure: ABDOMINAL EXPOSURE;  Surgeon: Leonie Douglas, MD;  Location: [facility] OR;  Service: Vascular;  Laterality: N/A;   ANTERIOR LUMBAR FUSION N/A 07/31/2021   Procedure: ANTERIOR LUMBAR INTERBODY FUSION LUMBAR 5 - SACRUM 1 WITH INSTRUMENTATION AND ALLOGRAFT;  Surgeon: Estill Bamberg, MD;  Location: [facility] OR;  Service: Orthopedics;  Laterality: N/A;   CARDIAC CATHETERIZATION  11/12/2016   CORONARY ANGIOPLASTY WITH STENT PLACEMENT   03/2004    x 3 stents   CORONARY ARTERY BYPASS GRAFT N/A 11/17/2016   Procedure: CORONARY ARTERY BYPASS GRAFTING (CABG) x 5, USING RIGHT INTERNAL MAMMARY ARTERY AND RIGHT GREATER SAPHENOUS VEIN HARVESTED ENDOSCOPICALLY;  Surgeon: Kerin Perna, MD;  Location: [facility] OR;  Service: Open Heart Surgery;  Laterality: N/A;   INGUINAL HERNIA REPAIR Right 2000   LEFT HEART CATH AND CORONARY ANGIOGRAPHY N/A 11/12/2016   Procedure: Left Heart Cath and Coronary Angiography;  Surgeon: Runell Gess, MD;  Location: [facility] INVASIVE CV LAB;  Service: Cardiovascular;  Laterality: N/A;   TEE WITHOUT CARDIOVERSION N/A 11/17/2016   Procedure: TRANSESOPHAGEAL ECHOCARDIOGRAM (TEE);  Surgeon: Donata Clay, Theron Arista, MD;  Location: [facility] OR;  Service: Open Heart Surgery;  Laterality: N/A;   Social History:  reports that he has never smoked. He has never used smokeless tobacco. He reports current alcohol use of about 2.0 standard drinks of alcohol per week. He reports current drug use. Drug: Marijuana. Family History:  Family History  Problem Relation Age of Onset   Diabetes Father    Diabetes Sister    Diabetes Paternal Grandfather    Cancer Neg Hx      HOME MEDICATIONS: Allergies as of 09/23/2022   No Known Allergies      Medication List        Accurate as of Sep 23, 2022  1:54 PM. If you have any questions, ask your nurse or doctor.          STOP taking these medications    metFORMIN 500 MG 24 hr tablet Commonly known as: GLUCOPHAGE-XR Stopped by: Scarlette Shorts, MD       TAKE these medications    amitriptyline 25 MG tablet Commonly known as: ELAVIL Take 25 mg by mouth at bedtime.   aspirin EC 81 MG tablet Take 81 mg by mouth daily.   Dexcom G6 Transmitter Misc Change every 10 days as needed/directed   insulin lispro 100 UNIT/ML injection Commonly known as: HUMALOG MAX DAILY 100 UNITS, this is correct dose in vials and not pens   HumaLOG KwikPen 200 UNIT/ML KwikPen Generic drug:  insulin lispro INJECT UPTO 100 UNITS DAILY PER SLIDING SCALE   Insulin Pen Needle 29G X Misc 1 Device by Does not apply route in the morning, at noon, in the evening, and at bedtime.   Lantus SoloStar 100 UNIT/ML Solostar Pen Generic drug: insulin glargine Inject 40 Units into the skin daily. What changed: how much to take   lisinopril 10 MG tablet Commonly known as: ZESTRIL Take 10 mg by mouth daily.   meloxicam 15 MG tablet Commonly known as: MOBIC Take 15 mg by mouth daily.   methocarbamol 500 MG tablet Commonly known as: ROBAXIN Take  1-2 tablets (500-1,000 mg total) by mouth every 6 (six) hours as needed for muscle spasms.   metoprolol tartrate 25 MG tablet Commonly known as: LOPRESSOR Take 1 tablet (25 mg total) by mouth 2 (two) times daily.   OneTouch Delica Lancets 30G Misc 1 each by Does not apply route in the morning, at noon, in the evening, and at bedtime. Use Onetouch Delica lancets to check blood sugar 3-4 times daily.   OneTouch Ultra test strip Generic drug: glucose blood 1 EACH BY OTHER ROUTE 3 (THREE) TIMES DAILY. USE ONETOUCH ULTRA TEST STRIPS AS INSTRUCTED TO CHECK BLOOD SUGAR 3-4 TIMES DAILY.   oxyCODONE-acetaminophen 5-325 MG tablet Commonly known as: PERCOCET/ROXICET Take 1-2 tablets by mouth every 4 (four) hours as needed for severe pain.   rosuvastatin 40 MG tablet Commonly known as: CRESTOR Take 1 tablet (40 mg total) by mouth daily.         OBJECTIVE:   Vital Signs: BP 110/72 (BP Location: Left Arm, Patient Position: Sitting, Cuff Size: Large)   Pulse 65   Ht 5\' 11"  (1.803 m)   Wt 232 lb (105.2 kg)   SpO2 94%   BMI 32.36 kg/m   Wt Readings from Last 3 Encounters:  09/23/22 232 lb (105.2 kg)  01/22/22 226 lb 3.2 oz (102.6 kg)  07/31/21 213 lb (96.6 kg)    Exam: General: Pt appears well and is in NAD  Lungs: Clear with good BS bilat with no rales, rhonchi, or wheezes  Heart: RRR   Extremities: No pretibial edema.   Neuro:  MS is good with appropriate affect, pt is alert and Ox3    DM foot exam: 09/23/2022     The skin of the feet is intact without sores or ulcerations. The pedal pulses are 1+ on right and 1+ on left. The sensation is intact  to a screening 5.07, 10 gram monofilament bilaterally at the toes     DATA REVIEWED:  Lab Results  Component Value Date   HGBA1C 9.0 (H) 07/28/2021   HGBA1C 9.6 (A) 03/25/2021   HGBA1C 8.4 (A) 09/10/2020   09/10/2022 BUN 17 CR 1.11 GFR 75 Albumin/CR ratio 29 A1c 8.3  ASSESSMENT / PLAN / RECOMMENDATIONS:   1) Type 2 Diabetes Mellitus, Poorly controlled, With Neuropathic and Macrovascular complications - Most recent A1c of 8.3 % %. Goal A1c < 7.0 %.    - Pt states his PCP did labs a few weeks ago ~ 8.5%  -Patient is frustrated with his glucose control, patient states that despite following my instructions his BG's remain elevated, we used today as an example.  Patient tells me he took Humalog 25 units at 11 AM, and his BG continues to rise since then, when I initially asked him if he ate anything, patient denied eating anything, I did explain to the patient that my recommendation was never to take Humalog 25 units without eating, and that my recommendation has always to eat within 5 minutes of taking Humalog, patient went on to remember that he did eat a bowl of cereal, I again verified if he took 25 units of Humalog, which she did verify, but in looking at his CGM, his fasting BG was 200, and he technically should have added Humalog based on correction scale which would have been a total dose of 29 units with breakfast, I did emphasize the importance of taking insulin appropriately and using the correction scale with each meal, but unfortunately if he misses out on an opportunity  to use the correction, his BG's will remain elevated -Historically SGLT-2 inhibitors, GLP-1 agonists and Omnipod 5 have been cost-prohibitive, I offered to prescribe either a GLP-1 agonist or  SGLT2 inhibitor to see if the co-pay has improved for this year, patient declines, but he continues to complain about his weight gain.  He eventually stated that he will buy the medication through the " black market", as this is how he has been doing with his Lantus.  I did express my concern regarding this practice, as it is unknown what could be sold on on the market and how legitimate the medication actually is -Patient does not qualify for patient assistance due to higher income than the cutoff -I will increase his Lantus, as he has been noted with hypoglycemia overnight, patient does not need a refill on the Lantus -No changes to Humalog -My assistant had contacted his PCPs office to obtain his lab records     MEDICATIONS: Increase Lantus 70 units daily  Continue Humalog 25 units with each meal  CF: Humalog (BG-130/15)   EDUCATION / INSTRUCTIONS: BG monitoring instructions: Patient is instructed to check his blood sugars 4 times a day, before meals and bedtime  Call [facility] if: BG persistently < 70  I reviewed the Rule of 15 for the treatment of hypoglycemia in detail with the patient. Literature supplied.    2) Diabetic complications:  Eye: Does not have known diabetic retinopathy.  Neuro/ Feet: Does have known diabetic peripheral neuropathy .  Renal: Patient does not have known baseline CKD. He   is not on an ACEI/ARB at present.     F/U in 6 months    I spent 31 minutes preparing to see the patient by review of recent labs, imaging and procedures, obtaining and reviewing separately obtained history, communicating with the patient/family or caregiver, ordering medications, tests or procedures, and documenting clinical information in the EHR including the differential Dx, treatment, and any further evaluation and other management     Signed electronically by: Lyndle Herrlich, MD  [facility]  [facility] [address]  [place] [place], [place] [address] Phone: [phone] FAX: [phone]   CC: Kaleen Mask, MD [address] [place] [address] Phone: [phone]  Fax: [phone]  Return to Endocrinology clinic as below: No future appointments.     056

## 2022-09-24 ENCOUNTER — Encounter: Payer: Self-pay | Admitting: Internal Medicine

## 2022-12-29 LAB — HEMOGLOBIN A1C: Hemoglobin A1C: 8.2

## 2023-01-07 ENCOUNTER — Encounter: Payer: Self-pay | Admitting: Internal Medicine

## 2023-01-27 LAB — HM DIABETES EYE EXAM

## 2023-01-28 ENCOUNTER — Encounter: Payer: Self-pay | Admitting: Internal Medicine

## 2023-01-31 ENCOUNTER — Other Ambulatory Visit: Payer: Self-pay | Admitting: Cardiology

## 2023-01-31 DIAGNOSIS — E1169 Type 2 diabetes mellitus with other specified complication: Secondary | ICD-10-CM

## 2023-03-02 ENCOUNTER — Other Ambulatory Visit: Payer: Self-pay | Admitting: Cardiology

## 2023-03-02 DIAGNOSIS — E1169 Type 2 diabetes mellitus with other specified complication: Secondary | ICD-10-CM

## 2023-04-01 ENCOUNTER — Other Ambulatory Visit: Payer: Self-pay | Admitting: Cardiology

## 2023-04-01 DIAGNOSIS — E1169 Type 2 diabetes mellitus with other specified complication: Secondary | ICD-10-CM

## 2023-04-03 ENCOUNTER — Other Ambulatory Visit: Payer: Self-pay | Admitting: Cardiology

## 2023-04-03 DIAGNOSIS — E1169 Type 2 diabetes mellitus with other specified complication: Secondary | ICD-10-CM

## 2023-04-22 ENCOUNTER — Other Ambulatory Visit: Payer: Self-pay | Admitting: Cardiology

## 2023-04-22 ENCOUNTER — Telehealth: Payer: Self-pay

## 2023-04-22 DIAGNOSIS — E785 Hyperlipidemia, unspecified: Secondary | ICD-10-CM

## 2023-04-22 NOTE — Telephone Encounter (Signed)
Called patient to make an appointment for a follow up to refill his Crestor. He did not pick up but I left a voicemail.

## 2023-08-04 ENCOUNTER — Inpatient Hospital Stay (HOSPITAL_COMMUNITY)
Admission: EM | Admit: 2023-08-04 | Discharge: 2023-08-07 | DRG: 683 | Disposition: A | Discharge status: Home / Self Care | Attending: Family Medicine | Admitting: Family Medicine

## 2023-08-04 ENCOUNTER — Encounter (HOSPITAL_COMMUNITY): Payer: Self-pay

## 2023-08-04 ENCOUNTER — Observation Stay (HOSPITAL_COMMUNITY)

## 2023-08-04 ENCOUNTER — Other Ambulatory Visit: Payer: Self-pay

## 2023-08-04 ENCOUNTER — Emergency Department (HOSPITAL_COMMUNITY)

## 2023-08-04 DIAGNOSIS — K920 Hematemesis: Secondary | ICD-10-CM | POA: Diagnosis not present

## 2023-08-04 DIAGNOSIS — D751 Secondary polycythemia: Secondary | ICD-10-CM | POA: Diagnosis present

## 2023-08-04 DIAGNOSIS — K3184 Gastroparesis: Secondary | ICD-10-CM | POA: Diagnosis present

## 2023-08-04 DIAGNOSIS — K2900 Acute gastritis without bleeding: Secondary | ICD-10-CM | POA: Diagnosis present

## 2023-08-04 DIAGNOSIS — Z7982 Long term (current) use of aspirin: Secondary | ICD-10-CM

## 2023-08-04 DIAGNOSIS — D72829 Elevated white blood cell count, unspecified: Secondary | ICD-10-CM | POA: Diagnosis present

## 2023-08-04 DIAGNOSIS — I2510 Atherosclerotic heart disease of native coronary artery without angina pectoris: Secondary | ICD-10-CM | POA: Diagnosis present

## 2023-08-04 DIAGNOSIS — E669 Obesity, unspecified: Secondary | ICD-10-CM | POA: Diagnosis present

## 2023-08-04 DIAGNOSIS — M797 Fibromyalgia: Secondary | ICD-10-CM | POA: Diagnosis present

## 2023-08-04 DIAGNOSIS — Z6830 Body mass index (BMI) 30.0-30.9, adult: Secondary | ICD-10-CM

## 2023-08-04 DIAGNOSIS — I700 Atherosclerosis of aorta: Secondary | ICD-10-CM | POA: Diagnosis present

## 2023-08-04 DIAGNOSIS — Z79899 Other long term (current) drug therapy: Secondary | ICD-10-CM

## 2023-08-04 DIAGNOSIS — E119 Type 2 diabetes mellitus without complications: Secondary | ICD-10-CM

## 2023-08-04 DIAGNOSIS — Z1152 Encounter for screening for COVID-19: Secondary | ICD-10-CM

## 2023-08-04 DIAGNOSIS — E039 Hypothyroidism, unspecified: Secondary | ICD-10-CM | POA: Diagnosis present

## 2023-08-04 DIAGNOSIS — E1165 Type 2 diabetes mellitus with hyperglycemia: Secondary | ICD-10-CM | POA: Diagnosis present

## 2023-08-04 DIAGNOSIS — K2100 Gastro-esophageal reflux disease with esophagitis, without bleeding: Secondary | ICD-10-CM | POA: Diagnosis present

## 2023-08-04 DIAGNOSIS — N179 Acute kidney failure, unspecified: Principal | ICD-10-CM | POA: Diagnosis present

## 2023-08-04 DIAGNOSIS — K2210 Ulcer of esophagus without bleeding: Secondary | ICD-10-CM | POA: Diagnosis present

## 2023-08-04 DIAGNOSIS — K219 Gastro-esophageal reflux disease without esophagitis: Secondary | ICD-10-CM | POA: Diagnosis present

## 2023-08-04 DIAGNOSIS — E860 Dehydration: Secondary | ICD-10-CM | POA: Diagnosis present

## 2023-08-04 DIAGNOSIS — E1143 Type 2 diabetes mellitus with diabetic autonomic (poly)neuropathy: Secondary | ICD-10-CM | POA: Diagnosis not present

## 2023-08-04 DIAGNOSIS — Z833 Family history of diabetes mellitus: Secondary | ICD-10-CM

## 2023-08-04 DIAGNOSIS — E1142 Type 2 diabetes mellitus with diabetic polyneuropathy: Secondary | ICD-10-CM

## 2023-08-04 DIAGNOSIS — I252 Old myocardial infarction: Secondary | ICD-10-CM

## 2023-08-04 DIAGNOSIS — I2489 Other forms of acute ischemic heart disease: Secondary | ICD-10-CM | POA: Diagnosis present

## 2023-08-04 DIAGNOSIS — E872 Acidosis, unspecified: Secondary | ICD-10-CM | POA: Diagnosis present

## 2023-08-04 DIAGNOSIS — F419 Anxiety disorder, unspecified: Secondary | ICD-10-CM | POA: Diagnosis present

## 2023-08-04 DIAGNOSIS — I480 Paroxysmal atrial fibrillation: Secondary | ICD-10-CM | POA: Diagnosis not present

## 2023-08-04 DIAGNOSIS — Z560 Unemployment, unspecified: Secondary | ICD-10-CM

## 2023-08-04 DIAGNOSIS — I25810 Atherosclerosis of coronary artery bypass graft(s) without angina pectoris: Secondary | ICD-10-CM | POA: Diagnosis present

## 2023-08-04 DIAGNOSIS — Z794 Long term (current) use of insulin: Secondary | ICD-10-CM

## 2023-08-04 DIAGNOSIS — I160 Hypertensive urgency: Secondary | ICD-10-CM | POA: Diagnosis present

## 2023-08-04 DIAGNOSIS — Z955 Presence of coronary angioplasty implant and graft: Secondary | ICD-10-CM

## 2023-08-04 DIAGNOSIS — R112 Nausea with vomiting, unspecified: Secondary | ICD-10-CM | POA: Diagnosis present

## 2023-08-04 DIAGNOSIS — E785 Hyperlipidemia, unspecified: Secondary | ICD-10-CM | POA: Diagnosis present

## 2023-08-04 DIAGNOSIS — K760 Fatty (change of) liver, not elsewhere classified: Secondary | ICD-10-CM | POA: Diagnosis present

## 2023-08-04 DIAGNOSIS — I10 Essential (primary) hypertension: Secondary | ICD-10-CM | POA: Diagnosis present

## 2023-08-04 LAB — URINALYSIS, ROUTINE W REFLEX MICROSCOPIC
Bacteria, UA: NONE SEEN
Bilirubin Urine: NEGATIVE
Glucose, UA: 150 mg/dL — AB
Ketones, ur: 20 mg/dL — AB
Leukocytes,Ua: NEGATIVE
Nitrite: NEGATIVE
Protein, ur: >=300 mg/dL — AB
Specific Gravity, Urine: 1.025 (ref 1.005–1.030)
pH: 5 (ref 5.0–8.0)

## 2023-08-04 LAB — CBC WITH DIFFERENTIAL/PLATELET
Abs Immature Granulocytes: 0.04 10*3/uL (ref 0.00–0.07)
Basophils Absolute: 0.1 10*3/uL (ref 0.0–0.1)
Basophils Relative: 1 %
Eosinophils Absolute: 0 10*3/uL (ref 0.0–0.5)
Eosinophils Relative: 0 %
HCT: 57.2 % — ABNORMAL HIGH (ref 39.0–52.0)
Hemoglobin: 20.1 g/dL — ABNORMAL HIGH (ref 13.0–17.0)
Immature Granulocytes: 0 %
Lymphocytes Relative: 13 %
Lymphs Abs: 1.5 10*3/uL (ref 0.7–4.0)
MCH: 30.4 pg (ref 26.0–34.0)
MCHC: 35.1 g/dL (ref 30.0–36.0)
MCV: 86.5 fL (ref 80.0–100.0)
Monocytes Absolute: 0.9 10*3/uL (ref 0.1–1.0)
Monocytes Relative: 8 %
Neutro Abs: 9.2 10*3/uL — ABNORMAL HIGH (ref 1.7–7.7)
Neutrophils Relative %: 78 %
Platelets: 277 10*3/uL (ref 150–400)
RBC: 6.61 MIL/uL — ABNORMAL HIGH (ref 4.22–5.81)
RDW: 13.5 % (ref 11.5–15.5)
WBC: 11.7 10*3/uL — ABNORMAL HIGH (ref 4.0–10.5)
nRBC: 0 % (ref 0.0–0.2)

## 2023-08-04 LAB — CBC
HCT: 55.4 % — ABNORMAL HIGH (ref 39.0–52.0)
Hemoglobin: 19.2 g/dL — ABNORMAL HIGH (ref 13.0–17.0)
MCH: 30 pg (ref 26.0–34.0)
MCHC: 34.7 g/dL (ref 30.0–36.0)
MCV: 86.7 fL (ref 80.0–100.0)
Platelets: 226 10*3/uL (ref 150–400)
RBC: 6.39 MIL/uL — ABNORMAL HIGH (ref 4.22–5.81)
RDW: 13.5 % (ref 11.5–15.5)
WBC: 15.5 10*3/uL — ABNORMAL HIGH (ref 4.0–10.5)
nRBC: 0 % (ref 0.0–0.2)

## 2023-08-04 LAB — COMPREHENSIVE METABOLIC PANEL
ALT: 31 U/L (ref 0–44)
AST: 28 U/L (ref 15–41)
Albumin: 4.6 g/dL (ref 3.5–5.0)
Alkaline Phosphatase: 80 U/L (ref 38–126)
Anion gap: 16 — ABNORMAL HIGH (ref 5–15)
BUN: 21 mg/dL (ref 8–23)
CO2: 24 mmol/L (ref 22–32)
Calcium: 10.2 mg/dL (ref 8.9–10.3)
Chloride: 102 mmol/L (ref 98–111)
Creatinine, Ser: 1.51 mg/dL — ABNORMAL HIGH (ref 0.61–1.24)
GFR, Estimated: 52 mL/min — ABNORMAL LOW (ref >60)
Glucose, Bld: 180 mg/dL — ABNORMAL HIGH (ref 70–99)
Potassium: 3.8 mmol/L (ref 3.5–5.1)
Sodium: 142 mmol/L (ref 135–145)
Total Bilirubin: 2.1 mg/dL — ABNORMAL HIGH (ref 0.0–1.2)
Total Protein: 7.8 g/dL (ref 6.5–8.1)

## 2023-08-04 LAB — HEMOGLOBIN A1C
Hgb A1c MFr Bld: 8.2 % — ABNORMAL HIGH (ref 4.8–5.6)
Mean Plasma Glucose: 188.64 mg/dL

## 2023-08-04 LAB — LACTIC ACID, PLASMA
Lactic Acid, Venous: 2.3 mmol/L (ref 0.5–1.9)
Lactic Acid, Venous: 2.3 mmol/L (ref 0.5–1.9)

## 2023-08-04 LAB — TROPONIN I (HIGH SENSITIVITY)
Troponin I (High Sensitivity): 36 ng/L — ABNORMAL HIGH (ref <18)
Troponin I (High Sensitivity): 33 ng/L — ABNORMAL HIGH (ref <18)

## 2023-08-04 LAB — RESP PANEL BY RT-PCR (RSV, FLU A&B, COVID)  RVPGX2
Influenza A by PCR: NEGATIVE
Influenza B by PCR: NEGATIVE
Resp Syncytial Virus by PCR: NEGATIVE
SARS Coronavirus 2 by RT PCR: NEGATIVE

## 2023-08-04 LAB — BASIC METABOLIC PANEL
Anion gap: 17 — ABNORMAL HIGH (ref 5–15)
BUN: 23 mg/dL (ref 8–23)
CO2: 21 mmol/L — ABNORMAL LOW (ref 22–32)
Calcium: 9.5 mg/dL (ref 8.9–10.3)
Chloride: 103 mmol/L (ref 98–111)
Creatinine, Ser: 1.42 mg/dL — ABNORMAL HIGH (ref 0.61–1.24)
GFR, Estimated: 56 mL/min — ABNORMAL LOW (ref >60)
Glucose, Bld: 272 mg/dL — ABNORMAL HIGH (ref 70–99)
Potassium: 3.6 mmol/L (ref 3.5–5.1)
Sodium: 141 mmol/L (ref 135–145)

## 2023-08-04 LAB — MAGNESIUM: Magnesium: 2.2 mg/dL (ref 1.7–2.4)

## 2023-08-04 LAB — TSH: TSH: 3.101 u[IU]/mL (ref 0.350–4.500)

## 2023-08-04 LAB — CK: Total CK: 253 U/L (ref 49–397)

## 2023-08-04 LAB — GLUCOSE, CAPILLARY: Glucose-Capillary: 245 mg/dL — ABNORMAL HIGH (ref 70–99)

## 2023-08-04 LAB — CBG MONITORING, ED: Glucose-Capillary: 285 mg/dL — ABNORMAL HIGH (ref 70–99)

## 2023-08-04 LAB — LIPASE, BLOOD: Lipase: 21 U/L (ref 11–51)

## 2023-08-04 LAB — PHOSPHORUS: Phosphorus: 3.3 mg/dL (ref 2.5–4.6)

## 2023-08-04 MED ORDER — SODIUM CHLORIDE 0.9 % IV BOLUS
1000.0000 mL | Freq: Once | INTRAVENOUS | Status: AC
  Administered 2023-08-04: 1000 mL via INTRAVENOUS

## 2023-08-04 MED ORDER — PANTOPRAZOLE SODIUM 40 MG IV SOLR
40.0000 mg | Freq: Two times a day (BID) | INTRAVENOUS | Status: DC
  Administered 2023-08-04 – 2023-08-07 (×6): 40 mg via INTRAVENOUS
  Filled 2023-08-04 (×6): qty 10

## 2023-08-04 MED ORDER — METOCLOPRAMIDE HCL 5 MG/ML IJ SOLN
10.0000 mg | Freq: Once | INTRAMUSCULAR | Status: AC
  Administered 2023-08-04: 10 mg via INTRAVENOUS
  Filled 2023-08-04: qty 2

## 2023-08-04 MED ORDER — SODIUM CHLORIDE 0.9 % IV SOLN: INTRAVENOUS | Status: AC

## 2023-08-04 MED ORDER — ONDANSETRON HCL 4 MG PO TABS
4.0000 mg | ORAL_TABLET | Freq: Four times a day (QID) | ORAL | Status: DC | PRN
Start: 2023-08-04 — End: 2023-08-07

## 2023-08-04 MED ORDER — INSULIN GLARGINE-YFGN 100 UNIT/ML ~~LOC~~ SOLN
40.0000 [IU] | Freq: Every day | SUBCUTANEOUS | Status: DC
  Administered 2023-08-05 – 2023-08-06 (×2): 40 [IU] via SUBCUTANEOUS
  Filled 2023-08-04 (×4): qty 0.4

## 2023-08-04 MED ORDER — ONDANSETRON HCL 4 MG/2ML IJ SOLN
4.0000 mg | Freq: Four times a day (QID) | INTRAMUSCULAR | Status: DC | PRN
  Administered 2023-08-04 – 2023-08-06 (×2): 4 mg via INTRAVENOUS
  Filled 2023-08-04 (×2): qty 2

## 2023-08-04 MED ORDER — GABAPENTIN 300 MG PO CAPS
1200.0000 mg | ORAL_CAPSULE | Freq: Two times a day (BID) | ORAL | Status: DC
  Filled 2023-08-04 (×3): qty 4

## 2023-08-04 MED ORDER — HYDRALAZINE HCL 20 MG/ML IJ SOLN
20.0000 mg | Freq: Four times a day (QID) | INTRAMUSCULAR | Status: DC | PRN
  Administered 2023-08-04: 20 mg via INTRAVENOUS
  Filled 2023-08-04: qty 1

## 2023-08-04 MED ORDER — ACETAMINOPHEN 325 MG PO TABS: 650.0000 mg | ORAL_TABLET | Freq: Four times a day (QID) | ORAL | Status: DC | PRN

## 2023-08-04 MED ORDER — ROSUVASTATIN CALCIUM 20 MG PO TABS
40.0000 mg | ORAL_TABLET | Freq: Every day | ORAL | Status: DC
  Administered 2023-08-05 – 2023-08-07 (×3): 40 mg via ORAL
  Filled 2023-08-04 (×3): qty 2

## 2023-08-04 MED ORDER — INSULIN ASPART 100 UNIT/ML IJ SOLN
0.0000 [IU] | INTRAMUSCULAR | Status: DC
  Administered 2023-08-04: 3 [IU] via SUBCUTANEOUS
  Administered 2023-08-04: 5 [IU] via SUBCUTANEOUS
  Administered 2023-08-05 (×3): 3 [IU] via SUBCUTANEOUS
  Administered 2023-08-05 (×2): 5 [IU] via SUBCUTANEOUS
  Administered 2023-08-06: 2 [IU] via SUBCUTANEOUS
  Administered 2023-08-06: 5 [IU] via SUBCUTANEOUS
  Administered 2023-08-06: 3 [IU] via SUBCUTANEOUS
  Administered 2023-08-06: 2 [IU] via SUBCUTANEOUS
  Administered 2023-08-06: 3 [IU] via SUBCUTANEOUS
  Administered 2023-08-07: 5 [IU] via SUBCUTANEOUS
  Administered 2023-08-07: 2 [IU] via SUBCUTANEOUS
  Administered 2023-08-07: 1 [IU] via SUBCUTANEOUS

## 2023-08-04 MED ORDER — DICYCLOMINE HCL 10 MG/ML IM SOLN
20.0000 mg | Freq: Once | INTRAMUSCULAR | Status: AC
  Administered 2023-08-04: 20 mg via INTRAMUSCULAR
  Filled 2023-08-04: qty 2

## 2023-08-04 MED ORDER — FENTANYL CITRATE PF 50 MCG/ML IJ SOSY: 12.5000 ug | PREFILLED_SYRINGE | INTRAMUSCULAR | Status: DC | PRN

## 2023-08-04 MED ORDER — PANTOPRAZOLE SODIUM 40 MG IV SOLR
40.0000 mg | Freq: Once | INTRAVENOUS | Status: AC
  Administered 2023-08-04: 40 mg via INTRAVENOUS
  Filled 2023-08-04: qty 10

## 2023-08-04 MED ORDER — IOHEXOL 350 MG/ML SOLN
75.0000 mL | Freq: Once | INTRAVENOUS | Status: AC | PRN
  Administered 2023-08-04: 75 mL via INTRAVENOUS

## 2023-08-04 MED ORDER — HYDROCODONE-ACETAMINOPHEN 5-325 MG PO TABS: 1.0000 | ORAL_TABLET | ORAL | Status: DC | PRN

## 2023-08-04 MED ORDER — ACETAMINOPHEN 650 MG RE SUPP: 650.0000 mg | Freq: Four times a day (QID) | RECTAL | Status: DC | PRN

## 2023-08-04 MED ORDER — METOCLOPRAMIDE HCL 5 MG/5ML PO SOLN
5.0000 mg | Freq: Four times a day (QID) | ORAL | Status: DC | PRN
  Administered 2023-08-04: 5 mg via ORAL
  Filled 2023-08-04 (×3): qty 10

## 2023-08-04 NOTE — Assessment & Plan Note (Addendum)
-   Order Sensitive SSI   Decreased lantus to 40 Units at bedtime and continue to follow CBG  -  check TSH and HgA1C  - Hold by mouth medications

## 2023-08-04 NOTE — Assessment & Plan Note (Signed)
 Will rehydrate and follow check orthostatics prior to discharge

## 2023-08-04 NOTE — Assessment & Plan Note (Signed)
 Increase Protonix to 40 twice daily given severe ongoing nausea vomiting epigastric pain

## 2023-08-04 NOTE — Subjective & Objective (Signed)
 Feeling unwell for 2 days N/V decreased appetite unable to keep down PO  no  fever Past hx of CAD sp CABG and DM2, hx of Gastroparesis Last BM was 3 days ago CT abd non acute

## 2023-08-04 NOTE — Assessment & Plan Note (Signed)
Continue Crestor 40 mg a day 

## 2023-08-04 NOTE — ED Provider Notes (Signed)
 Patient given in sign out by Jalene Mullet.  Please review their note for patient HPI, physical exam, workup.  At this time the plan is follow-up on imaging and reassess.  On reassessment patient still feels the same after medications and so we will try Bentyl and Protonix.  Still waiting for CT scan.  CT scan negative.  Patient does have AKI in which his creatinine is gone from 1-1.51.  There are ketones present in his urine along with anion gap however his glucose is only in the 180s.  Patient is not on any medications that would suggest euglycemic DKA and so I do suspect patient is just dehydrated and the anion gap is from starvation ketoacidosis as patient has not been able to eat.  I spoke to the hospitalist for admission and patient was accepted for admission.  Consults: Adela Glimpse, MD Hospitalist  .Critical Care  Performed by: Netta Corrigan, PA-C Authorized by: Netta Corrigan, PA-C   Critical care provider statement:    Critical care time (minutes):  30   Critical care time was exclusive of:  Separately billable procedures and treating other patients   Critical care was necessary to treat or prevent imminent or life-threatening deterioration of the following conditions:  Dehydration   Critical care was time spent personally by me on the following activities:  Blood draw for specimens, development of treatment plan with patient or surrogate, discussions with consultants, examination of patient, evaluation of patient's response to treatment, obtaining history from patient or surrogate, review of old charts, re-evaluation of patient's condition, pulse oximetry, ordering and review of radiographic studies, ordering and performing treatments and interventions and ordering and review of laboratory studies   I assumed direction of critical care for this patient from another provider in my specialty: yes     Care discussed with: admitting provider       Netta Corrigan, PA-C 08/04/23 Lawanna Kobus, MD 08/05/23 [phone]

## 2023-08-04 NOTE — Assessment & Plan Note (Signed)
 Hold lisinopril given soft blood pressures will hold Lopressor for tonight resume when able to tolerate

## 2023-08-04 NOTE — Assessment & Plan Note (Signed)
 Dark stomach contents in the vomitus although patient denies having any p.o. intake Given ongoing nausea vomiting could be esophagitis/Mallory-Weiss tear No significant bleeding noted no melena no dark stools Hemoglobin elevated secondary to hemoconcentration. Patient may benefit from GI eval if this persists Tonics twice daily N.p.o. postmidnight

## 2023-08-04 NOTE — Assessment & Plan Note (Signed)
-    evidence of acute renal failure due to presence of following: Cr increased >0.3 from baseline   likely secondary to dehydration,        check FeNA       Rehydrate with IV fluids      HOLD  ACE/ARBi and nephrotoxic medications  History does not suggest urinary retention or obstruction

## 2023-08-04 NOTE — ED Triage Notes (Signed)
 Pt hasn't been feeling well since Monday. Pt started vomiting Monday night and has had a decrease appetite. Pt states u/t keep anything down even water. Pt denies fever. Pt endorses chills.  Hx DM2, 5 bypasses, and major back surgery.

## 2023-08-04 NOTE — ED Notes (Signed)
 RN Miah notified of critical lab lac 2.3

## 2023-08-04 NOTE — Assessment & Plan Note (Signed)
 Patient history of diabetes Gastroparesis is on differential Patient also uses THC Last use was 3 days ago spoke about discontinuation. Supportive management will rehydrate Reglan as needed May benefit from gastric emptying study. CT abdomen non-acute

## 2023-08-04 NOTE — H&P (Signed)
 [name] [name] [id number] DOB: October 13, 1960 DOA: 08/04/2023     PCP: Kaleen Mask, MD   Outpatient Specialists:   CARDS:   Dr. Norman Herrlich, MD   Patient arrived to ER on 08/04/23 at 1230 Referred by Attending No att. providers found   Patient coming from:    home Lives alone,    Chief Complaint:   Chief Complaint  Patient presents with   Emesis    HPI: [name] [name] is a 63 y.o. male with medical history significant of DM2, HTN HLD, CAD sp CABG    Presented with   nausea vomiting Feeling unwell for 2 days N/V decreased appetite unable to keep down PO  no  fever Past hx of CAD sp CABG and DM2, hx of Gastroparesis Last BM was 3 days ago CT abd non acute   No CP no SOB mild headache    Denies significant ETOH intake   Does not smoke tobacco but does smoke THC  Lab Results  Component Value Date   SARSCOV2NAA NEGATIVE 08/04/2023       Regarding pertinent Chronic problems:     Hyperlipidemia - on statins  Crestor Lipid Panel     Component Value Date/Time   CHOL 123 11/28/2020 1527   TRIG 252 (H) 11/28/2020 1527   HDL 30 (L) 11/28/2020 1527   CHOLHDL 4.1 11/28/2020 1527   CHOLHDL 4.3 11/13/2016 0359   VLDL 49 (H) 11/13/2016 0359   LDLCALC 53 11/28/2020 1527   LABVLDL 40 11/28/2020 1527     HTN on metoprolol      CAD  - On Aspirin, statin, betablocker,                  - *followed by cardiology                - last cardiac cath        DM 2 -  Lab Results  Component Value Date   HGBA1C 8.2 12/29/2022    on insulin,     While in ER:    Dehydrated  Cr up to 1.5 CT non acute     Lab Orders         Resp panel by RT-PCR (RSV, Flu A&B, Covid) Anterior Nasal Swab         CBC with Differential         Comprehensive metabolic panel         Urinalysis, Routine w reflex microscopic -Urine, Clean Catch       CXR - ordered  CTabd/pelvis -  nonacute    Following Medications were ordered in ER: Medications  dicyclomine (BENTYL)  injection 20 mg (has no administration in time range)  pantoprazole (PROTONIX) injection 40 mg (has no administration in time range)  sodium chloride 0.9 % bolus 1,000 mL (0 mLs Intravenous Stopped 08/04/23 1702)  metoCLOPramide (REGLAN) injection 10 mg (10 mg Intravenous Given 08/04/23 1505)  iohexol (OMNIPAQUE) 350 MG/ML injection 75 mL (75 mLs Intravenous Contrast Given 08/04/23 1622)     ED Triage Vitals  Encounter Vitals Group     BP 08/04/23 1236 106/89     Systolic BP Percentile --      Diastolic BP Percentile --      Pulse Rate 08/04/23 1236 (!) 114     Resp 08/04/23 1236 20     Temp 08/04/23 1236 98.7 F (37.1 C)     Temp Source 08/04/23 1703 Oral  SpO2 08/04/23 1236 99 %     Weight 08/04/23 1248 222 lb (100.7 kg)     Height 08/04/23 1248 5\' 11"  (1.803 m)     Head Circumference --      Peak Flow --      Pain Score 08/04/23 1248 0     Pain Loc --      Pain Education --      Exclude from Growth Chart --   ONGE(95)@     _________________________________________ Significant initial  Findings: Abnormal Labs Reviewed  CBC WITH DIFFERENTIAL/PLATELET - Abnormal; Notable for the following components:      Result Value   WBC 11.7 (*)    RBC 6.61 (*)    Hemoglobin 20.1 (*)    HCT 57.2 (*)    Neutro Abs 9.2 (*)    All other components within normal limits  COMPREHENSIVE METABOLIC PANEL - Abnormal; Notable for the following components:   Glucose, Bld 180 (*)    Creatinine, Ser 1.51 (*)    Total Bilirubin 2.1 (*)    GFR, Estimated 52 (*)    Anion gap 16 (*)    All other components within normal limits  URINALYSIS, ROUTINE W REFLEX MICROSCOPIC - Abnormal; Notable for the following components:   Color, Urine AMBER (*)    APPearance HAZY (*)    Glucose, UA 150 (*)    Hgb urine dipstick SMALL (*)    Ketones, ur 20 (*)    Protein, ur >=300 (*)    All other components within normal limits      _________________________ Troponin  ordered   ECG: Ordered Personally  reviewed and interpreted by me showing: HR : 95 Rhythm:  Sinus rhythm Atrial premature complexes Inferolateral infarct, old QTC 481      Lab Results  Component Value Date   SARSCOV2NAA NEGATIVE 08/04/2023      The recent clinical data is shown below. Vitals:   08/04/23 1345 08/04/23 1400 08/04/23 1415 08/04/23 1703  BP: 131/86 (!) 138/94 139/80 (!) 119/103  Pulse: 95 93 (!) 104 (!) 105  Resp: 20 20 20  (!) 21  Temp:    99 F (37.2 C)  TempSrc:    Oral  SpO2: 95% 94% 94% 94%  Weight:      Height:        WBC     Component Value Date/Time   WBC 11.7 (H) 08/04/2023 1253   LYMPHSABS 1.5 08/04/2023 1253   MONOABS 0.9 08/04/2023 1253   EOSABS 0.0 08/04/2023 1253   BASOSABS 0.1 08/04/2023 1253        UA   no evidence of UTI  Urine analysis:    Component Value Date/Time   COLORURINE AMBER (A) 08/04/2023 1250   APPEARANCEUR HAZY (A) 08/04/2023 1250   LABSPEC 1.025 08/04/2023 1250   PHURINE 5.0 08/04/2023 1250   GLUCOSEU 150 (A) 08/04/2023 1250   HGBUR SMALL (A) 08/04/2023 1250   BILIRUBINUR NEGATIVE 08/04/2023 1250   BILIRUBINUR small 08/06/2015 1527   KETONESUR 20 (A) 08/04/2023 1250   PROTEINUR >=300 (A) 08/04/2023 1250   UROBILINOGEN 1.0 08/06/2015 1527   NITRITE NEGATIVE 08/04/2023 1250   LEUKOCYTESUR NEGATIVE 08/04/2023 1250    Results for orders placed or performed during the hospital encounter of 08/04/23  Resp panel by RT-PCR (RSV, Flu A&B, Covid) Anterior Nasal Swab     Status: None   Collection Time: 08/04/23 12:50 PM   Specimen: Anterior Nasal Swab  Result Value Ref Range Status  SARS Coronavirus 2 by RT PCR NEGATIVE NEGATIVE Final   Influenza A by PCR NEGATIVE NEGATIVE Final   Influenza B by PCR NEGATIVE NEGATIVE Final         Resp Syncytial Virus by PCR NEGATIVE NEGATIVE Final            __________________________________________________________ Recent Labs  Lab 08/04/23 1253  NA 142  K 3.8  CO2 24  GLUCOSE 180*  BUN 21  CREATININE  1.51*  CALCIUM 10.2    Cr    Up from baseline see below Lab Results  Component Value Date   CREATININE 1.51 (H) 08/04/2023   CREATININE 0.92 07/28/2021   CREATININE 1.01 11/28/2020    Recent Labs  Lab 08/04/23 1253  AST 28  ALT 31  ALKPHOS 80  BILITOT 2.1*  PROT 7.8  ALBUMIN 4.6   Lab Results  Component Value Date   CALCIUM 10.2 08/04/2023       Plt: Lab Results  Component Value Date   PLT 277 08/04/2023         Recent Labs  Lab 08/04/23 1253  WBC 11.7*  NEUTROABS 9.2*  HGB 20.1*  HCT 57.2*  MCV 86.5  PLT 277    HG/HCT  Up from baseline see below    Component Value Date/Time   HGB 20.1 (H) 08/04/2023 1253   HCT 57.2 (H) 08/04/2023 1253   MCV 86.5 08/04/2023 1253    ____________________________________ Hospitalist was called for admission for intractable nausea and vomiting presumed secondary to gastroparesis due to DM versus cannabinoid hyperemesis resulting in AKI      The following Work up has been ordered so far:  Orders Placed This Encounter  Procedures   Resp panel by RT-PCR (RSV, Flu A&B, Covid) Anterior Nasal Swab   CT ABDOMEN PELVIS W CONTRAST   CBC with Differential   Comprehensive metabolic panel   Urinalysis, Routine w reflex microscopic -Urine, Clean Catch   Fluid/PO Challenge   Consult for Unassigned Medical Admission   EKG 12-Lead     OTHER Significant initial  Findings:  labs showing:     DM  labs:  HbA1C: Recent Labs    12/29/22 0000  HGBA1C 8.2      CBG (last 3)  No results for input(s): "GLUCAP" in the last 72 hours.        Cultures: No results found for: "SDES", "SPECREQUEST", "CULT", "REPTSTATUS"   Radiological Exams on Admission: CT ABDOMEN PELVIS W CONTRAST Result Date: 08/04/2023 CLINICAL DATA:  Acute generalized abdominal pain. EXAM: CT ABDOMEN AND PELVIS WITH CONTRAST TECHNIQUE: Multidetector CT imaging of the abdomen and pelvis was performed using the standard protocol following bolus administration  of intravenous contrast. RADIATION DOSE REDUCTION: This exam was performed according to the departmental dose-optimization program which includes automated exposure control, adjustment of the mA and/or kV according to patient size and/or use of iterative reconstruction technique. CONTRAST:  75mL OMNIPAQUE IOHEXOL 350 MG/ML SOLN COMPARISON:  May 21, 2022. FINDINGS: Lower chest: No acute abnormality. Hepatobiliary: Hepatic steatosis. No cholelithiasis or biliary dilatation. Pancreas: Unremarkable. No pancreatic ductal dilatation or surrounding inflammatory changes. Spleen: Normal in size without focal abnormality. Adrenals/Urinary Tract: Adrenal glands are unremarkable. Kidneys are normal, without renal calculi, focal lesion, or hydronephrosis. Bladder is unremarkable. Stomach/Bowel: Stomach is within normal limits. Appendix appears normal. No evidence of bowel wall thickening, distention, or inflammatory changes. Vascular/Lymphatic: Aortic atherosclerosis. No enlarged abdominal or pelvic lymph nodes. Reproductive: Prostate is unremarkable. Other: No abdominal wall hernia or abnormality. No  abdominopelvic ascites. Musculoskeletal: Status post surgical posterior fusion of L5-S1. No acute osseous abnormalities noted. IMPRESSION: Hepatic steatosis. No acute abnormality seen in the abdomen or pelvis. Aortic Atherosclerosis (ICD10-I70.0). Electronically Signed   By: Lupita Raider M.D.   On: 08/04/2023 17:36   _______________________________________________________________________________________________________ Latest  Blood pressure (!) 119/103, pulse (!) 105, temperature 99 F (37.2 C), temperature source Oral, resp. rate (!) 21, height 5\' 11"  (1.803 m), weight 100.7 kg, SpO2 94%.   Vitals  labs and radiology finding personally reviewed  Review of Systems:    Pertinent positives include:  abdominal pain, nausea, vomiting  Constitutional:  No weight loss, night sweats, Fevers, chills, fatigue, weight  loss  HEENT:  No headaches, Difficulty swallowing,Tooth/dental problems,Sore throat,  No sneezing, itching, ear ache, nasal congestion, post nasal drip,  Cardio-vascular:  No chest pain, Orthopnea, PND, anasarca, dizziness, palpitations.no Bilateral lower extremity swelling  GI:  No heartburn, indigestion, , diarrhea, change in bowel habits, loss of appetite, melena, blood in stool, hematemesis Resp:  no shortness of breath at rest. No dyspnea on exertion, No excess mucus, no productive cough, No non-productive cough, No coughing up of blood.No change in color of mucus.No wheezing. Skin:  no rash or lesions. No jaundice GU:  no dysuria, change in color of urine, no urgency or frequency. No straining to urinate.  No flank pain.  Musculoskeletal:  No joint pain or no joint swelling. No decreased range of motion. No back pain.  Psych:  No change in mood or affect. No depression or anxiety. No memory loss.  Neuro: no localizing neurological complaints, no tingling, no weakness, no double vision, no gait abnormality, no slurred speech, no confusion  All systems reviewed and apart from HOPI all are negative _______________________________________________________________________________________________ Past Medical History:   Past Medical History:  Diagnosis Date   Anxiety    Chronic lower back pain    Coronary artery disease 03/2004   3 stents put in    Depression    Fibromyalgia    GERD (gastroesophageal reflux disease)    pt denies d/t weight loss   History of hiatal hernia    states no longer there per endoscopy   Hyperlipidemia LDL goal <70    Hypertension    Left leg pain    Low back pain    Migraine    "weekly; back in the 1990s" (11/12/2016)   Myocardial infarction (HCC) 2005   Neuroforaminal stenosis of lumbar spine    L5-S1   [name] (obstructive sleep apnea)    "quit wearing my mask" (11/12/2016)   Spondylolisthesis, grade 2    Type II diabetes mellitus (HCC) 2010       Past Surgical History:  Procedure Laterality Date   ABDOMINAL EXPOSURE N/A 07/31/2021   Procedure: ABDOMINAL EXPOSURE;  Surgeon: Leonie Douglas, MD;  Location: [facility] OR;  Service: Vascular;  Laterality: N/A;   ANTERIOR LUMBAR FUSION N/A 07/31/2021   Procedure: ANTERIOR LUMBAR INTERBODY FUSION LUMBAR 5 - SACRUM 1 WITH INSTRUMENTATION AND ALLOGRAFT;  Surgeon: Estill Bamberg, MD;  Location: [facility] OR;  Service: Orthopedics;  Laterality: N/A;   CARDIAC CATHETERIZATION  11/12/2016   CORONARY ANGIOPLASTY WITH STENT PLACEMENT  03/2004    x 3 stents   CORONARY ARTERY BYPASS GRAFT N/A 11/17/2016   Procedure: CORONARY ARTERY BYPASS GRAFTING (CABG) x 5, USING RIGHT INTERNAL MAMMARY ARTERY AND RIGHT GREATER SAPHENOUS VEIN HARVESTED ENDOSCOPICALLY;  Surgeon: Kerin Perna, MD;  Location: [facility] OR;  Service: Open Heart Surgery;  Laterality: N/A;  INGUINAL HERNIA REPAIR Right 2000   LEFT HEART CATH AND CORONARY ANGIOGRAPHY N/A 11/12/2016   Procedure: Left Heart Cath and Coronary Angiography;  Surgeon: Runell Gess, MD;  Location: [facility] INVASIVE CV LAB;  Service: Cardiovascular;  Laterality: N/A;   TEE WITHOUT CARDIOVERSION N/A 11/17/2016   Procedure: TRANSESOPHAGEAL ECHOCARDIOGRAM (TEE);  Surgeon: Donata Clay, Theron Arista, MD;  Location: [facility] OR;  Service: Open Heart Surgery;  Laterality: N/A;    Social History:  Ambulatory   independently       reports that he has never smoked. He has never used smokeless tobacco. He reports current alcohol use of about 2.0 standard drinks of alcohol per week. He reports current drug use. Drug: Marijuana.   Family History:   Family History  Problem Relation Age of Onset   Diabetes Father    Diabetes Sister    Diabetes Paternal Grandfather    Cancer Neg Hx    ______________________________________________________________________________________________ Allergies: No Known Allergies   Prior to Admission medications   Medication Sig Start Date End Date Taking? Authorizing  Provider  aspirin EC 81 MG tablet Take 81 mg by mouth daily.   Yes [provider]  Continuous Blood Gluc Transmit (DEXCOM G6 TRANSMITTER) MISC Change every 10 days as needed/directed 02/13/20  Yes Shamleffer, Konrad Dolores, MD  escitalopram (LEXAPRO) 20 MG tablet Take 20 mg by mouth daily. 07/12/23  Yes [provider]  gabapentin (NEURONTIN) 600 MG tablet Take 2 tablets by mouth every 12 (twelve) hours. 06/04/23  Yes [provider]  insulin glargine (LANTUS SOLOSTAR) 100 UNIT/ML Solostar Pen Inject 80 Units into the skin daily.   Yes [provider]  insulin lispro (HUMALOG KWIKPEN) 200 UNIT/ML KwikPen Max daily 110 units Patient taking differently: Inject 25 Units into the skin in the morning, at noon, and at bedtime. Max daily 110 units 09/23/22  Yes Shamleffer, Konrad Dolores, MD  lisinopril (ZESTRIL) 10 MG tablet Take 10 mg by mouth daily. 01/16/22  Yes [provider]  metoprolol tartrate (LOPRESSOR) 25 MG tablet Take 1 tablet (25 mg total) by mouth 2 (two) times daily. 11/25/16  Yes Doree Fudge M, PA-C  rosuvastatin (CRESTOR) 40 MG tablet Take 1 tablet (40 mg total) by mouth daily. Patient needs appointment for further refills. 3 rd/final attempt 04/05/23  Yes Munley, Iline Oven, MD  Insulin Pen Needle 29G X MISC 1 Device by Does not apply route in the morning, at noon, in the evening, and at bedtime. 03/25/21   Shamleffer, Konrad Dolores, MD  OneTouch Delica Lancets 30G MISC 1 each by Does not apply route in the morning, at noon, in the evening, and at bedtime. Use Onetouch Delica lancets to check blood sugar 3-4 times daily.    [provider]  ONETOUCH ULTRA test strip 1 EACH BY OTHER ROUTE 3 (THREE) TIMES DAILY. USE ONETOUCH ULTRA TEST STRIPS AS INSTRUCTED TO CHECK BLOOD SUGAR 3-4 TIMES DAILY. 09/11/20   Shamleffer, Konrad Dolores, MD     ___________________________________________________________________________________________________ Physical Exam:    08/04/2023    5:03 PM 08/04/2023    2:15 PM 08/04/2023    2:00 PM  Vitals with BMI  Systolic 119 139 295  Diastolic 103 80 94  Pulse 105 621 93     1. General:  in No  Acute distress   Chronically ill   -appearing 2. Psychological: Alert and   Oriented 3. Head/ENT:   Dry Mucous Membranes  Head Non traumatic, neck supple                       Poor Dentition 4. SKIN:  decreased Skin turgor,  Skin clean Dry and intact no rash    5. Heart: Regular rate and rhythm no  Murmur, no Rub or gallop 6. Lungs:  Clear to auscultation bilaterally, no wheezes or crackles   7. Abdomen: Soft,  non-tender, Non distended   obese  bowel sounds present 8. Lower extremities: no clubbing, cyanosis, no  edema 9. Neurologically  strength 5 out of 5 in all 4 extremities cranial nerves II through XII intact 10. MSK: Normal range of motion    Chart has been reviewed  ______________________________________________________________________________________________  Assessment/Plan  63 y.o. male with medical history significant of DM2, HTN HLD, CAD sp CABG  Admitted for  intractable nausea and vomiting presumed secondary to gastroparesis due to DM versus cannabinoid hyperemesis resulting in AKI    Present on Admission:  AKI (acute kidney injury) (HCC)  CAD (coronary artery disease) of artery bypass graft  Dyslipidemia  GERD (gastroesophageal reflux disease)  Intractable nausea and vomiting  Dehydration  Coffee ground emesis     AKI (acute kidney injury) (HCC) -  evidence of acute renal failure due to presence of following: Cr increased >0.3 from baseline   likely secondary to dehydration,        check FeNA       Rehydrate with IV fluids      HOLD  ACE/ARBi and nephrotoxic medications  History does not suggest urinary retention or obstruction          CAD (coronary artery disease) of artery bypass graft Hold lisinopril given soft blood pressures will hold Lopressor for tonight resume when able to tolerate  DM (diabetes mellitus), type 2 (HCC)  - Order Sensitive SSI   Decreased lantus to 40 Units at bedtime and continue to follow CBG  -  check TSH and HgA1C  - Hold by mouth medications    Dyslipidemia Continue Crestor 40 mg a day  GERD (gastroesophageal reflux disease) Increase Protonix to 40 twice daily given severe ongoing nausea vomiting epigastric pain  Intractable nausea and vomiting Patient history of diabetes Gastroparesis is on differential Patient also uses THC Last use was 3 days ago spoke about discontinuation. Supportive management will rehydrate Reglan as needed May benefit from gastric emptying study. CT abdomen non-acute     Dehydration Will rehydrate and follow check orthostatics prior to discharge  Coffee ground emesis Dark stomach contents in the vomitus although patient denies having any p.o. intake Given ongoing nausea vomiting could be esophagitis/Mallory-Weiss tear No significant bleeding noted no melena no dark stools Hemoglobin elevated secondary to hemoconcentration. Patient may benefit from GI eval if this persists Tonics twice daily N.p.o. postmidnight  Other plan as per orders.  DVT prophylaxis:  SCD       Code Status:    Code Status: Prior FULL CODE   as per patient   I had personally discussed CODE STATUS with patient   ACP   none   Family Communication:   Family not at  Bedside    Diet clear NPo post midnight   Disposition Plan:      To home once workup is complete and patient is stable   Following barriers for discharge:  Epigastric pain intractable nausea vomiting work up is complete                            Electrolytes corrected                                                            Pain controlled with PO medications                             Will need consultants to evaluate patient prior to discharge              Consults called: sent msg to [facility]   Admission status:  ED Disposition     ED Disposition  Admit   Condition  --   Comment  Hospital Area[facility] [[id number]]  Level of Care: Progressive [102]  Admit to Progressive based on following criteria: GI, ENDOCRINE disease patients with GI bleeding, acute liver failure or pancreatitis, stable with diabetic ketoacidosis or thyrotoxicosis (hypothyroid) state.  May place patient in observation at [facility] or [facility] if equivalent level of care is available:: No  Covid Evaluation: Asymptomatic - no recent exposure (last 10 days) testing not required  Diagnosis: AKI (acute kidney injury) [facility]) [[id number]]  Admitting Physician: Therisa Doyne [[id number]]  Attending Physician: Therisa Doyne [[id number]]           Obs      Level of care       progressive    tele indefinitely please discontinue once patient no longer qualifies COVID-19 Labs    Lab Results  Component Value Date   SARSCOV2NAA NEGATIVE 08/04/2023     Precautions: admitted as   Covid Negative     Karlina Suares 08/04/2023, 8:39 PM     [facility]     after 2 AM please page floor coverage PA If 7AM-7PM, please contact the day team taking care of the patient using [email or web address]

## 2023-08-04 NOTE — ED Notes (Signed)
 Assumed pt care from [facility]

## 2023-08-04 NOTE — ED Provider Notes (Signed)
 [place] EMERGENCY DEPARTMENT AT [facility] Provider Note   CSN: [id number] Arrival date & time: 08/04/23  1230     History  Chief Complaint  Patient presents with   Emesis    [name] [name] is a 63 y.o. male past medical history significant for type 2 diabetes, CABG x 5, hypertension, MI, and GERD presents today for nausea and vomiting since Monday night.  Patient also endorses decreased appetite and chills.  Patient denies fever, cough, congestion, shortness of breath, chest pain, diarrhea, or abdominal pain.  Patient states that his last bowel movement was on Sunday and he has not been passing gas since.  Patient also endorses an anterior approach for back surgery previously.   Emesis      Home Medications Prior to Admission medications   Medication Sig Start Date End Date Taking? Authorizing Provider  aspirin EC 81 MG tablet Take 81 mg by mouth daily.    [provider]  Continuous Blood Gluc Transmit (DEXCOM G6 TRANSMITTER) MISC Change every 10 days as needed/directed 02/13/20   Shamleffer, Konrad Dolores, MD  escitalopram (LEXAPRO) 20 MG tablet Take 20 mg by mouth daily. 07/12/23   [provider]  gabapentin (NEURONTIN) 600 MG tablet Take 2 tablets by mouth every 12 (twelve) hours. 06/04/23   [provider]  insulin glargine (LANTUS SOLOSTAR) 100 UNIT/ML Solostar Pen Inject 70 Units into the skin daily. 09/23/22   Shamleffer, Konrad Dolores, MD  insulin lispro (HUMALOG KWIKPEN) 200 UNIT/ML KwikPen Max daily 110 units Patient taking differently: Inject 25 Units into the skin in the morning, at noon, and at bedtime. Max daily 110 units 09/23/22   Shamleffer, Konrad Dolores, MD  Insulin Pen Needle 29G X MISC 1 Device by Does not apply route in the morning, at noon, in the evening, and at bedtime. 03/25/21   Shamleffer, Konrad Dolores, MD  lisinopril (ZESTRIL) 10 MG tablet Take 10 mg by mouth daily. 01/16/22   [provider]   metoprolol tartrate (LOPRESSOR) 25 MG tablet Take 1 tablet (25 mg total) by mouth 2 (two) times daily. 11/25/16   Ardelle Balls, PA-C  OneTouch Delica Lancets 30G MISC 1 each by Does not apply route in the morning, at noon, in the evening, and at bedtime. Use Onetouch Delica lancets to check blood sugar 3-4 times daily.    [provider]  ONETOUCH ULTRA test strip 1 EACH BY OTHER ROUTE 3 (THREE) TIMES DAILY. USE ONETOUCH ULTRA TEST STRIPS AS INSTRUCTED TO CHECK BLOOD SUGAR 3-4 TIMES DAILY. 09/11/20   Shamleffer, Konrad Dolores, MD  rosuvastatin (CRESTOR) 40 MG tablet Take 1 tablet (40 mg total) by mouth daily. Patient needs appointment for further refills. 3 rd/final attempt 04/05/23   Baldo Daub, MD      Allergies    Patient has no known allergies.    Review of Systems   Review of Systems  Gastrointestinal:  Positive for vomiting.    Physical Exam Updated Vital Signs BP 139/80   Pulse (!) 104   Temp 98.7 F (37.1 C)   Resp 20   Ht 5\' 11"  (1.803 m)   Wt 100.7 kg   SpO2 94%   BMI 30.96 kg/m  Physical Exam Constitutional:      General: He is not in acute distress.    Appearance: Normal appearance. He is obese. He is ill-appearing. He is not toxic-appearing or diaphoretic.  HENT:     Head: Normocephalic and atraumatic.  Right Ear: External ear normal.     Left Ear: External ear normal.     Nose: Nose normal.     Mouth/Throat:     Mouth: Mucous membranes are moist.  Eyes:     Extraocular Movements: Extraocular movements intact.  Cardiovascular:     Rate and Rhythm: Normal rate and regular rhythm.     Pulses: Normal pulses.     Heart sounds: Normal heart sounds.  Pulmonary:     Effort: Pulmonary effort is normal. No respiratory distress.     Breath sounds: Normal breath sounds.  Abdominal:     General: Abdomen is protuberant. Bowel sounds are normal.     Palpations: Abdomen is soft.     Tenderness: There is generalized abdominal tenderness. There  is no right CVA tenderness or left CVA tenderness.  Musculoskeletal:        General: Normal range of motion.     Cervical back: Normal range of motion.  Skin:    General: Skin is warm and dry.     Capillary Refill: Capillary refill takes less than 2 seconds.  Neurological:     General: No focal deficit present.     Mental Status: He is alert and oriented to person, place, and time.     ED Results / Procedures / Treatments   Labs (all labs ordered are listed, but only abnormal results are displayed) Labs Reviewed  CBC WITH DIFFERENTIAL/PLATELET - Abnormal; Notable for the following components:      Result Value   WBC 11.7 (*)    RBC 6.61 (*)    Hemoglobin 20.1 (*)    HCT 57.2 (*)    Neutro Abs 9.2 (*)    All other components within normal limits  COMPREHENSIVE METABOLIC PANEL - Abnormal; Notable for the following components:   Glucose, Bld 180 (*)    Creatinine, Ser 1.51 (*)    Total Bilirubin 2.1 (*)    GFR, Estimated 52 (*)    Anion gap 16 (*)    All other components within normal limits  URINALYSIS, ROUTINE W REFLEX MICROSCOPIC - Abnormal; Notable for the following components:   Color, Urine AMBER (*)    APPearance HAZY (*)    Glucose, UA 150 (*)    Hgb urine dipstick SMALL (*)    Ketones, ur 20 (*)    Protein, ur >=300 (*)    All other components within normal limits  RESP PANEL BY RT-PCR (RSV, FLU A&B, COVID)  [id number]    EKG None  Radiology No results found.  Procedures Procedures    Medications Ordered in ED Medications  sodium chloride 0.9 % bolus 1,000 mL (has no administration in time range)  metoCLOPramide (REGLAN) injection 10 mg (10 mg Intravenous Given 08/04/23 1505)    ED Course/ Medical Decision Making/ A&P                                 Medical Decision Making Amount and/or Complexity of Data Reviewed Radiology: ordered.  Risk Prescription drug management.   This patient presents to the ED with chief complaint(s) of nausea and  vomiting with pertinent past medical history of none which further complicates the presenting complaint. The complaint involves an extensive differential diagnosis and also carries with it a high risk of complications and morbidity.    The differential diagnosis includes GI illness, intra-abdominal abscess, pancreatitis, appendicitis, acute cholecystitis, choledocholithiasis, SBO, volvulus  Additional  history obtained: Records reviewed Care Everywhere/External Records  ED Course and Reassessment:   Independent labs interpretation:  The following labs were independently interpreted:  CBC: Leukocytosis at 11.7 CMP: Elevated creatinine at 1.51, increased anion gap at 16, increased total bilirubin at 2.1 Respiratory panel: Negative EKG: Sinus rhythm, PACs UA: 150 glucose, small hemoglobin, 20 ketones, greater than 300 protein  Independent visualization of imaging: - I independently visualized the following imaging with scope of interpretation limited to determining acute life threatening conditions related to emergency care: CT abdomen pelvis with contrast, which revealed Pending  Patient signed out to Evlyn Kanner, PA-C pending CT abdomen pelvis which will determine dispo.        Final Clinical Impression(s) / ED Diagnoses Final diagnoses:  None    Rx / DC Orders ED Discharge Orders     None         Gretta Began 08/04/23 1507    Benjiman Core, MD 08/05/23 [phone]

## 2023-08-05 ENCOUNTER — Inpatient Hospital Stay (HOSPITAL_COMMUNITY)

## 2023-08-05 DIAGNOSIS — R7989 Other specified abnormal findings of blood chemistry: Secondary | ICD-10-CM | POA: Diagnosis not present

## 2023-08-05 DIAGNOSIS — F418 Other specified anxiety disorders: Secondary | ICD-10-CM | POA: Diagnosis not present

## 2023-08-05 DIAGNOSIS — I2489 Other forms of acute ischemic heart disease: Secondary | ICD-10-CM | POA: Diagnosis present

## 2023-08-05 DIAGNOSIS — I160 Hypertensive urgency: Secondary | ICD-10-CM

## 2023-08-05 DIAGNOSIS — Z794 Long term (current) use of insulin: Secondary | ICD-10-CM | POA: Diagnosis not present

## 2023-08-05 DIAGNOSIS — N179 Acute kidney failure, unspecified: Secondary | ICD-10-CM | POA: Diagnosis present

## 2023-08-05 DIAGNOSIS — E1143 Type 2 diabetes mellitus with diabetic autonomic (poly)neuropathy: Secondary | ICD-10-CM | POA: Diagnosis present

## 2023-08-05 DIAGNOSIS — R079 Chest pain, unspecified: Secondary | ICD-10-CM

## 2023-08-05 DIAGNOSIS — I700 Atherosclerosis of aorta: Secondary | ICD-10-CM | POA: Diagnosis present

## 2023-08-05 DIAGNOSIS — E872 Acidosis, unspecified: Secondary | ICD-10-CM | POA: Diagnosis present

## 2023-08-05 DIAGNOSIS — I10 Essential (primary) hypertension: Secondary | ICD-10-CM | POA: Diagnosis present

## 2023-08-05 DIAGNOSIS — I25810 Atherosclerosis of coronary artery bypass graft(s) without angina pectoris: Secondary | ICD-10-CM | POA: Diagnosis present

## 2023-08-05 DIAGNOSIS — D751 Secondary polycythemia: Secondary | ICD-10-CM | POA: Diagnosis present

## 2023-08-05 DIAGNOSIS — E669 Obesity, unspecified: Secondary | ICD-10-CM | POA: Diagnosis present

## 2023-08-05 DIAGNOSIS — F419 Anxiety disorder, unspecified: Secondary | ICD-10-CM | POA: Diagnosis present

## 2023-08-05 DIAGNOSIS — M797 Fibromyalgia: Secondary | ICD-10-CM | POA: Diagnosis present

## 2023-08-05 DIAGNOSIS — K2210 Ulcer of esophagus without bleeding: Secondary | ICD-10-CM | POA: Diagnosis present

## 2023-08-05 DIAGNOSIS — I480 Paroxysmal atrial fibrillation: Secondary | ICD-10-CM | POA: Diagnosis not present

## 2023-08-05 DIAGNOSIS — D72829 Elevated white blood cell count, unspecified: Secondary | ICD-10-CM | POA: Diagnosis present

## 2023-08-05 DIAGNOSIS — I2510 Atherosclerotic heart disease of native coronary artery without angina pectoris: Secondary | ICD-10-CM | POA: Diagnosis present

## 2023-08-05 DIAGNOSIS — E039 Hypothyroidism, unspecified: Secondary | ICD-10-CM | POA: Diagnosis present

## 2023-08-05 DIAGNOSIS — Z1152 Encounter for screening for COVID-19: Secondary | ICD-10-CM | POA: Diagnosis not present

## 2023-08-05 DIAGNOSIS — I25119 Atherosclerotic heart disease of native coronary artery with unspecified angina pectoris: Secondary | ICD-10-CM | POA: Diagnosis not present

## 2023-08-05 DIAGNOSIS — E860 Dehydration: Secondary | ICD-10-CM | POA: Diagnosis present

## 2023-08-05 DIAGNOSIS — E785 Hyperlipidemia, unspecified: Secondary | ICD-10-CM | POA: Diagnosis present

## 2023-08-05 DIAGNOSIS — K760 Fatty (change of) liver, not elsewhere classified: Secondary | ICD-10-CM | POA: Diagnosis present

## 2023-08-05 DIAGNOSIS — Z79899 Other long term (current) drug therapy: Secondary | ICD-10-CM | POA: Diagnosis not present

## 2023-08-05 DIAGNOSIS — E1165 Type 2 diabetes mellitus with hyperglycemia: Secondary | ICD-10-CM | POA: Diagnosis present

## 2023-08-05 DIAGNOSIS — K2900 Acute gastritis without bleeding: Secondary | ICD-10-CM | POA: Diagnosis not present

## 2023-08-05 LAB — CBC
HCT: 53.4 % — ABNORMAL HIGH (ref 39.0–52.0)
HCT: 55.9 % — ABNORMAL HIGH (ref 39.0–52.0)
HCT: 52.5 % — ABNORMAL HIGH (ref 39.0–52.0)
Hemoglobin: 18.4 g/dL — ABNORMAL HIGH (ref 13.0–17.0)
Hemoglobin: 18.9 g/dL — ABNORMAL HIGH (ref 13.0–17.0)
Hemoglobin: 18.1 g/dL — ABNORMAL HIGH (ref 13.0–17.0)
MCH: 30.3 pg (ref 26.0–34.0)
MCH: 29.8 pg (ref 26.0–34.0)
MCH: 30 pg (ref 26.0–34.0)
MCHC: 34.5 g/dL (ref 30.0–36.0)
MCHC: 33.8 g/dL (ref 30.0–36.0)
MCHC: 34.5 g/dL (ref 30.0–36.0)
MCV: 87.8 fL (ref 80.0–100.0)
MCV: 88 fL (ref 80.0–100.0)
MCV: 87.1 fL (ref 80.0–100.0)
Platelets: 225 10*3/uL (ref 150–400)
Platelets: 234 10*3/uL (ref 150–400)
Platelets: 222 10*3/uL (ref 150–400)
RBC: 6.08 MIL/uL — ABNORMAL HIGH (ref 4.22–5.81)
RBC: 6.35 MIL/uL — ABNORMAL HIGH (ref 4.22–5.81)
RBC: 6.03 MIL/uL — ABNORMAL HIGH (ref 4.22–5.81)
RDW: 13.6 % (ref 11.5–15.5)
RDW: 13.8 % (ref 11.5–15.5)
RDW: 13.8 % (ref 11.5–15.5)
WBC: 13.9 10*3/uL — ABNORMAL HIGH (ref 4.0–10.5)
WBC: 14.7 10*3/uL — ABNORMAL HIGH (ref 4.0–10.5)
WBC: 14 10*3/uL — ABNORMAL HIGH (ref 4.0–10.5)
nRBC: 0 % (ref 0.0–0.2)
nRBC: 0 % (ref 0.0–0.2)
nRBC: 0 % (ref 0.0–0.2)

## 2023-08-05 LAB — GLUCOSE, CAPILLARY
Glucose-Capillary: 262 mg/dL — ABNORMAL HIGH (ref 70–99)
Glucose-Capillary: 206 mg/dL — ABNORMAL HIGH (ref 70–99)
Glucose-Capillary: 203 mg/dL — ABNORMAL HIGH (ref 70–99)
Glucose-Capillary: 226 mg/dL — ABNORMAL HIGH (ref 70–99)
Glucose-Capillary: 251 mg/dL — ABNORMAL HIGH (ref 70–99)

## 2023-08-05 LAB — COMPREHENSIVE METABOLIC PANEL WITH GFR
ALT: 24 U/L (ref 0–44)
AST: 29 U/L (ref 15–41)
Albumin: 4.4 g/dL (ref 3.5–5.0)
Alkaline Phosphatase: 70 U/L (ref 38–126)
Anion gap: 14 (ref 5–15)
BUN: 18 mg/dL (ref 8–23)
CO2: 24 mmol/L (ref 22–32)
Calcium: 9.6 mg/dL (ref 8.9–10.3)
Chloride: 105 mmol/L (ref 98–111)
Creatinine, Ser: 1.26 mg/dL — ABNORMAL HIGH (ref 0.61–1.24)
GFR, Estimated: >60 mL/min (ref >60)
Glucose, Bld: 182 mg/dL — ABNORMAL HIGH (ref 70–99)
Potassium: 3.8 mmol/L (ref 3.5–5.1)
Sodium: 143 mmol/L (ref 135–145)
Total Bilirubin: 1.8 mg/dL — ABNORMAL HIGH (ref 0.0–1.2)
Total Protein: 7.8 g/dL (ref 6.5–8.1)

## 2023-08-05 LAB — RAPID URINE DRUG SCREEN, HOSP PERFORMED
Amphetamines: NOT DETECTED
Barbiturates: NOT DETECTED
Benzodiazepines: NOT DETECTED
Cocaine: NOT DETECTED
Opiates: NOT DETECTED
Tetrahydrocannabinol: POSITIVE — AB

## 2023-08-05 LAB — SODIUM, URINE, RANDOM: Sodium, Ur: 125 mmol/L

## 2023-08-05 LAB — OSMOLALITY, URINE: Osmolality, Ur: 614 mosm/kg (ref 300–900)

## 2023-08-05 LAB — MAGNESIUM: Magnesium: 2.3 mg/dL (ref 1.7–2.4)

## 2023-08-05 LAB — TROPONIN I (HIGH SENSITIVITY)
Troponin I (High Sensitivity): 109 ng/L (ref <18)
Troponin I (High Sensitivity): 81 ng/L — ABNORMAL HIGH (ref <18)

## 2023-08-05 LAB — D-DIMER, QUANTITATIVE: D-Dimer, Quant: 1.29 ug{FEU}/mL — ABNORMAL HIGH (ref 0.00–0.50)

## 2023-08-05 LAB — PHOSPHORUS: Phosphorus: 3.1 mg/dL (ref 2.5–4.6)

## 2023-08-05 LAB — CREATININE, URINE, RANDOM: Creatinine, Urine: 75 mg/dL

## 2023-08-05 LAB — HIV ANTIBODY (ROUTINE TESTING W REFLEX): HIV Screen 4th Generation wRfx: NONREACTIVE

## 2023-08-05 MED ORDER — MORPHINE SULFATE (PF) 2 MG/ML IV SOLN
2.0000 mg | INTRAVENOUS | Status: DC | PRN
  Administered 2023-08-05 (×2): 2 mg via INTRAVENOUS
  Filled 2023-08-05 (×2): qty 1

## 2023-08-05 MED ORDER — HYDRALAZINE HCL 20 MG/ML IJ SOLN
20.0000 mg | Freq: Four times a day (QID) | INTRAMUSCULAR | Status: DC | PRN
  Administered 2023-08-05: 20 mg via INTRAVENOUS
  Filled 2023-08-05: qty 1

## 2023-08-05 MED ORDER — ALUM & MAG HYDROXIDE-SIMETH 200-200-20 MG/5ML PO SUSP: 30.0000 mL | ORAL | Status: DC | PRN

## 2023-08-05 MED ORDER — PHENOL 1.4 % MT LIQD
1.0000 | OROMUCOSAL | Status: DC | PRN
  Administered 2023-08-05: 1 via OROMUCOSAL
  Filled 2023-08-05: qty 177

## 2023-08-05 MED ORDER — AMLODIPINE BESYLATE 2.5 MG PO TABS
2.5000 mg | ORAL_TABLET | Freq: Every day | ORAL | Status: DC
  Administered 2023-08-06 – 2023-08-07 (×2): 2.5 mg via ORAL
  Filled 2023-08-05 (×2): qty 1

## 2023-08-05 MED ORDER — NITROGLYCERIN 0.4 MG SL SUBL
SUBLINGUAL_TABLET | SUBLINGUAL | Status: AC
  Filled 2023-08-05: qty 1

## 2023-08-05 MED ORDER — SODIUM CHLORIDE 0.9 % IV SOLN: INTRAVENOUS | Status: AC

## 2023-08-05 MED ORDER — LABETALOL HCL 5 MG/ML IV SOLN
10.0000 mg | INTRAVENOUS | Status: AC
  Administered 2023-08-05: 10 mg via INTRAVENOUS
  Filled 2023-08-05: qty 4

## 2023-08-05 MED ORDER — METOPROLOL TARTRATE 25 MG PO TABS
25.0000 mg | ORAL_TABLET | Freq: Two times a day (BID) | ORAL | Status: DC
  Administered 2023-08-06: 25 mg via ORAL
  Filled 2023-08-05 (×2): qty 1

## 2023-08-05 MED ORDER — HYDRALAZINE HCL 20 MG/ML IJ SOLN: 10.0000 mg | Freq: Four times a day (QID) | INTRAMUSCULAR | Status: DC | PRN

## 2023-08-05 MED ORDER — IOHEXOL 350 MG/ML SOLN
75.0000 mL | Freq: Once | INTRAVENOUS | Status: AC | PRN
  Administered 2023-08-05: 75 mL via INTRAVENOUS

## 2023-08-05 NOTE — Hospital Course (Addendum)
 [name] [name] is a 63 y.o. male with a history of diabetes mellitus type 2, hypertension, CAD status post PCI/stent and CABG.  Patient presented secondary to recurrent nausea and vomiting with associated dehydration and acute kidney injury.  Patient is on IV fluids.  During hospitalization, patient developed left-sided atypical chest pain concerning with underlying cardiac history. Endoscopy significant for moderate esophagitis patient started on treatment with cervical fate and to continue Protonix.  During hospitalization, patient developed paroxysmal atrial fibrillation with RVR.  Cardiology consulted and medications adjusted for outpatient.  Patient to follow-up with primary care physician, cardiology, in addition to GI as needed for biopsy results.

## 2023-08-05 NOTE — Progress Notes (Signed)
 PROGRESS NOTE    [name] [name]  ZOX:[id number] DOB: 1960-08-04 DOA: 08/04/2023 PCP: Kaleen Mask, MD   Brief Narrative: [name] [name] is a 63 y.o. male with a history of diabetes mellitus type 2, hypertension, CAD status post PCI/stent and CABG.  Patient presented secondary to recurrent nausea and vomiting with associated dehydration and acute kidney injury.  Patient is on IV fluids.  During hospitalization, patient developed left-sided chest pain concerning with underlying cardiac history.   Assessment and Plan:  AKI Baseline creatinine of about 1. Creatinine of 1.51 on admission. In setting of nausea/vomiting. IV fluids started. Creatinine improving. -Continue IV fluids -BMP in AM  Intractable nausea/vomiting Unclear etiology. No associated diarrhea. History of hiatal hernia, per chart review. CT abdomen/pelvis significant for hepatic steatosis, otherwise nothing to explain symptoms. Some dark emesis overnight. -IV fluids -Clear liquid diet -GI consultation for consideration of upper endoscopy -Zofran PRN  Dark emesis Unclear if this was coffee-ground. Multiple episodes. Patient without significant risk factors for upper GI bleeding.  -GI consultation for consideration of upper endoscopy -Trend CBC -Protonix  CAD History of PCI with stent and CABG. Patient follows with cardiology as an outpatient. Patient is on Crestor and aspirin as an outpatient. Aspirin held secondary to concern for possible GI bleeding. -Continue Crestor  Primary hypertension Patient is on metoprolol as an outpatient which was held on admission held on admission. Patient started on hydralazine IV prn. -Discontinue hydralazine as this may be contributing to patient's flushing -Resume home metoprolol  Diabetes mellitus type 2 Uncontrolled with hyperglycemia based on hemoglobin A1C of 8.2%. Patient is on Lantus and Humalog as an outpatient. -Continue Semglee and SSI  Anxiety -Continue  Lexapro  GERD -Continue Protonix  Hyperlipidemia -Continue Crestor  Aortic atherosclerosis -Continue Crestor  THC use Cessation counseled on admission..  Chest pain New symptoms. Per patient, likely GI related. EKG with new anterolateral ST-T segment changes. Concerning for possible NSTEMI. No improvement with nitroglycerin. Concern for possible GI bleeding; heparin deferred. -Morphine IV -Troponin; cardiology consult if elevated -D-dimer; CTA chest if positive   DVT prophylaxis: SCDs Code Status:   Code Status: Full Code Family Communication: Daughter at bedside Disposition Plan: Discharge pending ongoing specialist recommendations/management and ability to advance diet   Consultants:  Gastroenterology  Procedures:  None  Antimicrobials: None    Subjective: Patient without emesis this morning. Eager to advance diet. No specific concerns.  This afternoon, received page that patient was having chest pain. Patient reevaluated at bedside.  Patient reports left sided sharp pain without radiation that was made worse after drinking cranberry juice. No associated dyspnea or diaphoresis, but patient does have some chills.   Objective: BP (!) 134/91 (BP Location: Left Arm)   Pulse (!) 105   Temp 98 F (36.7 C) (Oral)   Resp 20   Ht 5\' 11"  (1.803 m)   Wt 99.1 kg   SpO2 98%   BMI 30.47 kg/m   Examination:  General exam: Appears slightly anxious. Respiratory system: Clear to auscultation. Respiratory effort normal. Cardiovascular system: S1 & S2 heard, Mild tachycardia. Systolic murmur Gastrointestinal system: Abdomen is nondistended, soft and nontender.  Normal bowel sounds heard. Central nervous system: Alert and oriented. No focal neurological deficits. Musculoskeletal: No edema. No calf tenderness. Reproducible chest pain. Psychiatry: Judgement and insight appear normal. Mood & affect appropriate.    Data Reviewed: I have personally reviewed following labs  and imaging studies  CBC Lab Results  Component Value Date  WBC 13.9 (H) 08/05/2023   RBC 6.08 (H) 08/05/2023   HGB 18.4 (H) 08/05/2023   HCT 53.4 (H) 08/05/2023   MCV 87.8 08/05/2023   MCH 30.3 08/05/2023   PLT 225 08/05/2023   MCHC 34.5 08/05/2023   RDW 13.6 08/05/2023   LYMPHSABS 1.5 08/04/2023   MONOABS 0.9 08/04/2023   EOSABS 0.0 08/04/2023   BASOSABS 0.1 08/04/2023     Last metabolic panel Lab Results  Component Value Date   NA 141 08/04/2023   K 3.6 08/04/2023   CL 103 08/04/2023   CO2 21 (L) 08/04/2023   BUN 23 08/04/2023   CREATININE 1.42 (H) 08/04/2023   GLUCOSE 272 (H) 08/04/2023   GFRNONAA 56 (L) 08/04/2023   GFRAA >60 11/24/2016   CALCIUM 9.5 08/04/2023   PHOS 3.3 08/04/2023   PROT 7.8 08/04/2023   ALBUMIN 4.6 08/04/2023   LABGLOB 2.3 11/28/2020   AGRATIO 2.1 11/28/2020   BILITOT 2.1 (H) 08/04/2023   ALKPHOS 80 08/04/2023   AST 28 08/04/2023   ALT 31 08/04/2023   ANIONGAP 17 (H) 08/04/2023    GFR: Estimated Creatinine Clearance: 63.9 mL/min (A) (by C-G formula based on SCr of 1.42 mg/dL (H)).  Recent Results (from the past 240 hours)  Resp panel by RT-PCR (RSV, Flu A&B, Covid) Anterior Nasal Swab     Status: None   Collection Time: 08/04/23 12:50 PM   Specimen: Anterior Nasal Swab  Result Value Ref Range Status   SARS Coronavirus 2 by RT PCR NEGATIVE NEGATIVE Final   Influenza A by PCR NEGATIVE NEGATIVE Final   Influenza B by PCR NEGATIVE NEGATIVE Final    Comment: (NOTE) The Xpert Xpress SARS-CoV-2/FLU/RSV plus assay is intended as an aid in the diagnosis of influenza from Nasopharyngeal swab specimens and should not be used as a sole basis for treatment. Nasal washings and aspirates are unacceptable for Xpert Xpress SARS-CoV-2/FLU/RSV testing.  Fact Sheet for Patients: BloggerCourse.com  Fact Sheet for Healthcare Providers: SeriousBroker.it  This test is not yet approved or cleared  by the [place] FDA and has been authorized for detection and/or diagnosis of SARS-CoV-2 by FDA under an Emergency Use Authorization (EUA). This EUA will remain in effect (meaning this test can be used) for the duration of the COVID-19 declaration under Section 564(b)(1) of the Act, 21 U.S.C. section 360bbb-3(b)(1), unless the authorization is terminated or revoked.     Resp Syncytial Virus by PCR NEGATIVE NEGATIVE Final    Comment: (NOTE) Fact Sheet for Patients: BloggerCourse.com  Fact Sheet for Healthcare Providers: SeriousBroker.it  This test is not yet approved or cleared by the [place] FDA and has been authorized for detection and/or diagnosis of SARS-CoV-2 by FDA under an Emergency Use Authorization (EUA). This EUA will remain in effect (meaning this test can be used) for the duration of the COVID-19 declaration under Section 564(b)(1) of the Act, 21 U.S.C. section 360bbb-3(b)(1), unless the authorization is terminated or revoked.  Performed at [facility], [address], [place], [place] [address]       Radiology Studies: CT HEAD WO CONTRAST ( ) Result Date: 08/05/2023 CLINICAL DATA:  Headache, new onset (Age >= 51y) EXAM: CT HEAD WITHOUT CONTRAST TECHNIQUE: Contiguous axial images were obtained from the base of the skull through the vertex without intravenous contrast. RADIATION DOSE REDUCTION: This exam was performed according to the departmental dose-optimization program which includes automated exposure control, adjustment of the mA and/or kV according to patient size and/or use of iterative  reconstruction technique. COMPARISON:  MRI head June 24, 23. FINDINGS: Brain: No evidence of acute infarction, hemorrhage, hydrocephalus, extra-axial collection or mass lesion/mass effect. Patchy white matter hypodensities are nonspecific but compatible with chronic microvascular ischemic change. Vascular: Residual  contrast is likely due to contrasted CT abdomen/pelvis performed earlier today. Skull: No acute fracture. Sinuses/Orbits: Paranasal sinus mucosal thickening. Other: No mastoid effusions. IMPRESSION: No evidence of acute intracranial abnormality. Electronically Signed   By: Feliberto Harts M.D.   On: 08/05/2023 01:32   DG Chest Port 1 View Result Date: 08/04/2023 CLINICAL DATA:  Epigastric pain. EXAM: PORTABLE CHEST 1 VIEW COMPARISON:  12/30/2016. FINDINGS: The heart size and mediastinal contours are stable. Lung volumes are low with atelectasis at the lung bases. No consolidation, effusion, or pneumothorax is seen. Sternotomy wires are present over the midline. IMPRESSION: Low lung volumes with atelectasis at the lung bases. Electronically Signed   By: Thornell Sartorius M.D.   On: 08/04/2023 21:12   CT ABDOMEN PELVIS W CONTRAST Result Date: 08/04/2023 CLINICAL DATA:  Acute generalized abdominal pain. EXAM: CT ABDOMEN AND PELVIS WITH CONTRAST TECHNIQUE: Multidetector CT imaging of the abdomen and pelvis was performed using the standard protocol following bolus administration of intravenous contrast. RADIATION DOSE REDUCTION: This exam was performed according to the departmental dose-optimization program which includes automated exposure control, adjustment of the mA and/or kV according to patient size and/or use of iterative reconstruction technique. CONTRAST:  75mL OMNIPAQUE IOHEXOL 350 MG/ML SOLN COMPARISON:  May 21, 2022. FINDINGS: Lower chest: No acute abnormality. Hepatobiliary: Hepatic steatosis. No cholelithiasis or biliary dilatation. Pancreas: Unremarkable. No pancreatic ductal dilatation or surrounding inflammatory changes. Spleen: Normal in size without focal abnormality. Adrenals/Urinary Tract: Adrenal glands are unremarkable. Kidneys are normal, without renal calculi, focal lesion, or hydronephrosis. Bladder is unremarkable. Stomach/Bowel: Stomach is within normal limits. Appendix appears normal.  No evidence of bowel wall thickening, distention, or inflammatory changes. Vascular/Lymphatic: Aortic atherosclerosis. No enlarged abdominal or pelvic lymph nodes. Reproductive: Prostate is unremarkable. Other: No abdominal wall hernia or abnormality. No abdominopelvic ascites. Musculoskeletal: Status post surgical posterior fusion of L5-S1. No acute osseous abnormalities noted. IMPRESSION: Hepatic steatosis. No acute abnormality seen in the abdomen or pelvis. Aortic Atherosclerosis (ICD10-I70.0). Electronically Signed   By: Lupita Raider M.D.   On: 08/04/2023 17:36      LOS: 0 days    Jacquelin Hawking, MD [facility] 08/05/2023, 8:04 AM   If 7PM-7AM, please contact night-coverage [email or web address]

## 2023-08-05 NOTE — TOC Initial Note (Addendum)
 Transition of Care [facility]) - Initial/Assessment Note    Patient Details  Name: [name] [name] MRN: [id number] Date of Birth: 1961/01/01  Transition of Care [facility]) CM/SW Contact:    Leone Haven, RN Phone Number: 08/05/2023, 3:19 PM  Clinical Narrative:                 Patient gave this NCM permission to talk in front of daughter Erin Hearing in the room.  From home alone,  has PCP and insurance on file, states has no HH services in place at this time or DME at home.  States family member will transport them home at [employer] and family is support system, states gets medications from [facility] in [place] on [address]  Pta self ambulatory.   Expected Discharge Plan: Home/Self Care Barriers to Discharge: Continued Medical Work up   Patient Goals and CMS Choice Patient states their goals for this hospitalization and ongoing recovery are:: return home   Choice offered to / list presented to : NA      Expected Discharge Plan and Services In-house Referral: NA Discharge Planning Services: CM Consult Post Acute Care Choice: NA Living arrangements for the past 2 months: Single Family Home                 DME Arranged: N/A DME Agency: NA       HH Arranged: NA          Prior Living Arrangements/Services Living arrangements for the past 2 months: Single Family Home Lives with:: Self Patient language and need for interpreter reviewed:: Yes Do you feel safe going back to the place where you live?: Yes      Need for Family Participation in Patient Care: No (Comment) Care giver support system in place?: Yes (comment)   Criminal Activity/Legal Involvement Pertinent to Current Situation/Hospitalization: No - Comment as needed  Activities of Daily Living   ADL Screening (condition at time of admission) Independently performs ADLs?: Yes (appropriate for developmental age) Is the patient deaf or have difficulty hearing?: No Does the patient have difficulty seeing, even when wearing  glasses/contacts?: No Does the patient have difficulty concentrating, remembering, or making decisions?: No  Permission Sought/Granted Permission sought to share information with : Case Manager Permission granted to share information with : Yes, Verbal Permission Granted              Emotional Assessment Appearance:: Appears stated age Attitude/Demeanor/Rapport: Engaged Affect (typically observed): Appropriate Orientation: : Oriented to Self, Oriented to Place, Oriented to  Time, Oriented to Situation Alcohol / Substance Use: Not Applicable Psych Involvement: No (comment)  Admission diagnosis:  AKI (acute kidney injury) (HCC) [N17.9] 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, 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] Patient Active Problem List   Diagnosis Date Noted   AKI (acute kidney injury) (HCC) 08/04/2023   Intractable nausea and vomiting 08/04/2023   Dehydration 08/04/2023   Coffee ground emesis 08/04/2023   Neuroforaminal stenosis of lumbar spine 01/21/2022   Spondylolisthesis, grade 2 01/21/2022   Left leg pain 01/21/2022   Radiculopathy 07/31/2021   Type 2 diabetes mellitus with diabetic polyneuropathy, with long-term current use of insulin (HCC) 07/28/2021   Pain in joint of left shoulder 05/01/2021   [name] (obstructive sleep apnea)    Migraine    Hypertension    Hyperlipidemia LDL goal <70    History of hiatal hernia    GERD (gastroesophageal reflux disease)    Fibromyalgia    Chronic left-sided low back pain with sciatica  Unstable angina (HCC) 09/13/2020   Coronary artery disease involving native coronary artery of native heart with unstable angina pectoris (HCC) 09/13/2020   Type 2 diabetes mellitus with diabetic polyneuropathy, without long-term current use of insulin (HCC) 11/01/2019   Diabetes mellitus (HCC) 11/01/2019   Dyslipidemia 11/01/2019   Type 2 diabetes mellitus with hyperglycemia, without long-term current use of insulin (HCC) 10/31/2019   Coronary artery disease 11/17/2016    Chest pain 11/12/2016   Hyperlipidemia associated with type 2 diabetes mellitus (HCC) 08/06/2015   Spondylolisthesis at L5-S1 level 01/16/2015   Rhinitis, chronic 01/04/2015   Erectile dysfunction 10/19/2014   Left breast mass 10/19/2014   TMJ (temporomandibular joint syndrome) 03/26/2014   Right shoulder pain 01/31/2014   DM (diabetes mellitus), type 2 (HCC) 01/04/2014   CAD (coronary artery disease) of artery bypass graft 01/04/2014   Chronic low back pain 01/04/2014   Fracture of great toe, left, closed 01/04/2014   Hypertension associated with diabetes (HCC) 01/04/2014   Depression 01/04/2014   Anxiety 01/04/2014   OCD (obsessive compulsive disorder) 01/04/2014   Type 2 diabetes mellitus (HCC) 01/04/2014   Type II diabetes mellitus (HCC) 2010   Myocardial infarction [facility]) 2005   PCP:  Kaleen Mask, MD Pharmacy:   [facility] - [place], [place] - [address] [place] [address] Phone: [phone] Fax: [phone]  [facility] (New Address) - [place], [place] - [address] AT Previously: [place], [place] [address] 4[address] [place] [address] Phone: [phone] Fax: [phone]  [id number] - [place], [place] - [address] AT [facility] [address] [place] [address] Phone: [phone] Fax: [phone]  [id number] - [place], [place] - [address] [facility] [address] Phone: [phone] Fax: [phone]     Social Drivers of Health (SDOH) Social History: SDOH Screenings   Food Insecurity: No Food Insecurity (08/04/2023)  Housing: Low Risk  (08/04/2023)  Transportation Needs: No Transportation Needs (08/04/2023)  Utilities: Not At Risk (08/04/2023)  Depression (PHQ2-9): Medium Risk (03/11/2020)  Tobacco Use: Low Risk  (08/04/2023)   SDOH  Interventions:     Readmission Risk Interventions     No data to display

## 2023-08-05 NOTE — Plan of Care (Signed)
 0000--Pt having elevated BPs despite administering anti-nausea dedication (see flowsheet). MD notified and pt given IV hydralazine and IV labetalol (see MAR). 0600--Pt's BP responding well to intervention at this time. Pt has no c/o of nausea or discomfort. All emesis for this shift <100 mls. Problem: Clinical Measurements: Goal: Ability to maintain clinical measurements within normal limits will improve Outcome: Progressing   Problem: Elimination: Goal: Will not experience complications related to bowel motility Outcome: Progressing   Problem: Pain Managment: Goal: General experience of comfort will improve and/or be controlled Outcome: Progressing

## 2023-08-05 NOTE — Consult Note (Signed)
 Referring Provider: Dr. Caleb Popp Primary Care Physician:  Kaleen Mask, MD Primary Gastroenterologist:  [facility] (Dr. Evette Cristal)  Reason for Consultation:  Intractable nausea and vomiting  HPI: [name] [name] is a 63 y.o. male with acute onset of intractable nausea and vomiting that began Monday night and continued each day even without eating/drinking. Vomitus was initially food-colored and then turned bilious yellow and then brown. Denies black or red vomitus. Denies associated abdominal pain, melena, or hematochezia. Denies dizziness. Reports that cold water will set off his nausea and vomiting. Denies NSAIDs. Abd/pelvis CT negative for acute findings. Occasional alcohol about once per week. Smokes THC (Hemp) and denies other drugs. Normal EGD in 2010. Normal colonoscopy in 11/2019. Family in room.  Past Medical History:  Diagnosis Date   Anxiety    Chronic lower back pain    Coronary artery disease 03/2004   3 stents put in    Depression    Fibromyalgia    GERD (gastroesophageal reflux disease)    pt denies d/t weight loss   History of hiatal hernia    states no longer there per endoscopy   Hyperlipidemia LDL goal <70    Hypertension    Left leg pain    Low back pain    Migraine    "weekly; back in the 1990s" (11/12/2016)   Myocardial infarction (HCC) 2005   Neuroforaminal stenosis of lumbar spine    L5-S1   [name] (obstructive sleep apnea)    "quit wearing my mask" (11/12/2016)   Spondylolisthesis, grade 2    Type II diabetes mellitus (HCC) 2010    Past Surgical History:  Procedure Laterality Date   ABDOMINAL EXPOSURE N/A 07/31/2021   Procedure: ABDOMINAL EXPOSURE;  Surgeon: Leonie Douglas, MD;  Location: [facility] OR;  Service: Vascular;  Laterality: N/A;   ANTERIOR LUMBAR FUSION N/A 07/31/2021   Procedure: ANTERIOR LUMBAR INTERBODY FUSION LUMBAR 5 - SACRUM 1 WITH INSTRUMENTATION AND ALLOGRAFT;  Surgeon: Estill Bamberg, MD;  Location: [facility] OR;  Service: Orthopedics;  Laterality: N/A;    CARDIAC CATHETERIZATION  11/12/2016   CORONARY ANGIOPLASTY WITH STENT PLACEMENT  03/2004    x 3 stents   CORONARY ARTERY BYPASS GRAFT N/A 11/17/2016   Procedure: CORONARY ARTERY BYPASS GRAFTING (CABG) x 5, USING RIGHT INTERNAL MAMMARY ARTERY AND RIGHT GREATER SAPHENOUS VEIN HARVESTED ENDOSCOPICALLY;  Surgeon: Kerin Perna, MD;  Location[facility] OR;  Service: Open Heart Surgery;  Laterality: N/A;   INGUINAL HERNIA REPAIR Right 2000   LEFT HEART CATH AND CORONARY ANGIOGRAPHY N/A 11/12/2016   Procedure: Left Heart Cath and Coronary Angiography;  Surgeon: Runell Gess, MD;  Location: [facility] INVASIVE CV LAB;  Service: Cardiovascular;  Laterality: N/A;   TEE WITHOUT CARDIOVERSION N/A 11/17/2016   Procedure: TRANSESOPHAGEAL ECHOCARDIOGRAM (TEE);  Surgeon: Donata Clay, Theron Arista, MD;  Location: [facility] OR;  Service: Open Heart Surgery;  Laterality: N/A;    Prior to Admission medications   Medication Sig Start Date End Date Taking? Authorizing Provider  aspirin EC 81 MG tablet Take 81 mg by mouth daily.   Yes [provider]  Continuous Blood Gluc Transmit (DEXCOM G6 TRANSMITTER) MISC Change every 10 days as needed/directed 02/13/20  Yes Shamleffer, Konrad Dolores, MD  escitalopram (LEXAPRO) 20 MG tablet Take 20 mg by mouth daily. 07/12/23  Yes [provider]  gabapentin (NEURONTIN) 600 MG tablet Take 2 tablets by mouth every 12 (twelve) hours. 06/04/23  Yes [provider]  insulin glargine (LANTUS SOLOSTAR) 100 UNIT/ML Solostar Pen  Inject 80 Units into the skin daily.   Yes [provider]  insulin lispro (HUMALOG KWIKPEN) 200 UNIT/ML KwikPen Max daily 110 units Patient taking differently: Inject 25 Units into the skin in the morning, at noon, and at bedtime. Max daily 110 units 09/23/22  Yes Shamleffer, Konrad Dolores, MD  lisinopril (ZESTRIL) 10 MG tablet Take 10 mg by mouth daily. 01/16/22  Yes [provider]  metoprolol tartrate (LOPRESSOR) 25 MG tablet Take 1 tablet  (25 mg total) by mouth 2 (two) times daily. 11/25/16  Yes Doree Fudge M, PA-C  rosuvastatin (CRESTOR) 40 MG tablet Take 1 tablet (40 mg total) by mouth daily. Patient needs appointment for further refills. 3 rd/final attempt 04/05/23  Yes Munley, Iline Oven, MD  Insulin Pen Needle 29G X MISC 1 Device by Does not apply route in the morning, at noon, in the evening, and at bedtime. 03/25/21   Shamleffer, Konrad Dolores, MD  OneTouch Delica Lancets 30G MISC 1 each by Does not apply route in the morning, at noon, in the evening, and at bedtime. Use Onetouch Delica lancets to check blood sugar 3-4 times daily.    [provider]  ONETOUCH ULTRA test strip 1 EACH BY OTHER ROUTE 3 (THREE) TIMES DAILY. USE ONETOUCH ULTRA TEST STRIPS AS INSTRUCTED TO CHECK BLOOD SUGAR 3-4 TIMES DAILY. 09/11/20   Shamleffer, Konrad Dolores, MD    Scheduled Meds:  gabapentin  1,200 mg Oral Q12H   insulin aspart  0-9 Units Subcutaneous Q4H   insulin glargine-yfgn  40 Units Subcutaneous QHS   pantoprazole (PROTONIX) IV  40 mg Intravenous Q12H   rosuvastatin  40 mg Oral Daily   Continuous Infusions:  sodium chloride 80 mL/hr at 08/05/23 1148   PRN Meds:.acetaminophen **OR** acetaminophen, fentaNYL (SUBLIMAZE) injection, hydrALAZINE, HYDROcodone-acetaminophen, metoCLOPramide, ondansetron **OR** ondansetron (ZOFRAN) IV, phenol  Allergies as of 08/04/2023   (No Known Allergies)    Family History  Problem Relation Age of Onset   Diabetes Father    Diabetes Sister    Diabetes Paternal Grandfather    Cancer Neg Hx     Social History   Socioeconomic History   Marital status: Divorced    Spouse name: Not on file   Number of children: 1    Years of education: 12    Highest education level: Not on file  Occupational History   Occupation: Unemployed   Tobacco Use   Smoking status: Never   Smokeless tobacco: Never  Vaping Use   Vaping status: Never Used  Substance and Sexual Activity   Alcohol  use: Yes    Alcohol/week: 2.0 standard drinks of alcohol    Types: 2 Cans of beer per week   Drug use: Yes    Types: Marijuana    Comment: once a month- smoking marijuana   Sexual activity: Never  Other Topics Concern   Not on file  Social History Narrative   Lives alone.    Daughter lives nearby,5 miles away   Sister next door.    1 grandchild, 26 yo granddaughter    Social Drivers of Corporate investment banker Strain: Not on file  Food Insecurity: No Food Insecurity (08/04/2023)   Hunger Vital Sign    Worried About Running Out of Food in the Last Year: Never true    Ran Out of Food in the Last Year: Never true  Transportation Needs: No Transportation Needs (08/04/2023)   PRAPARE - Administrator, Civil Service (Medical): No  Lack of Transportation (Non-Medical): No  Physical Activity: Not on file  Stress: Not on file  Social Connections: Not on file  Intimate Partner Violence: Not At Risk (08/04/2023)   Humiliation, Afraid, Rape, and Kick questionnaire    Fear of Current or Ex-Partner: No    Emotionally Abused: No    Physically Abused: No    Sexually Abused: No    Review of Systems: All negative except as stated above in HPI.  Physical Exam: Vital signs: Vitals:   08/05/23 0815 08/05/23 1202  BP: 123/89 (!) 169/95  Pulse: 99   Resp: 16   Temp:    SpO2: 95%   T 98  Last BM Date : 08/01/23 General:   lethargic, well-nourished, no acute distress, pleasant  Head: normocephalic, atraumatic Eyes: anicteric sclera ENT: oropharynx clear Neck: supple, nontender Lungs:  Clear throughout to auscultation.   No wheezes, crackles, or rhonchi. No acute distress. Heart:  Regular rate and rhythm; no murmurs, clicks, rubs,  or gallops. Abdomen: soft, nontender, nondistended, +BS  Rectal:  Deferred Ext: no edema  GI:  Lab Results: Recent Labs    08/04/23 2035 08/05/23 0301 08/05/23 1103  WBC 15.5* 13.9* 14.7*  HGB 19.2* 18.4* 18.9*  HCT 55.4* 53.4*  55.9*  PLT 226 225 234   BMET Recent Labs    08/04/23 1253 08/04/23 2035  NA 142 141  K 3.8 3.6  CL 102 103  CO2 24 21*  GLUCOSE 180* 272*  BUN 21 23  CREATININE 1.51* 1.42*  CALCIUM 10.2 9.5   LFT Recent Labs    08/04/23 1253  PROT 7.8  ALBUMIN 4.6  AST 28  ALT 31  ALKPHOS 80  BILITOT 2.1*   PT/INR No results for input(s): "LABPROT", "INR" in the last 72 hours.   Studies/Results: CT HEAD WO CONTRAST ( ) Result Date: 08/05/2023 CLINICAL DATA:  Headache, new onset (Age >= 51y) EXAM: CT HEAD WITHOUT CONTRAST TECHNIQUE: Contiguous axial images were obtained from the base of the skull through the vertex without intravenous contrast. RADIATION DOSE REDUCTION: This exam was performed according to the departmental dose-optimization program which includes automated exposure control, adjustment of the mA and/or kV according to patient size and/or use of iterative reconstruction technique. COMPARISON:  MRI head June 24, 23. FINDINGS: Brain: No evidence of acute infarction, hemorrhage, hydrocephalus, extra-axial collection or mass lesion/mass effect. Patchy white matter hypodensities are nonspecific but compatible with chronic microvascular ischemic change. Vascular: Residual contrast is likely due to contrasted CT abdomen/pelvis performed earlier today. Skull: No acute fracture. Sinuses/Orbits: Paranasal sinus mucosal thickening. Other: No mastoid effusions. IMPRESSION: No evidence of acute intracranial abnormality. Electronically Signed   By: Feliberto Harts M.D.   On: 08/05/2023 01:32   DG Chest Port 1 View Result Date: 08/04/2023 CLINICAL DATA:  Epigastric pain. EXAM: PORTABLE CHEST 1 VIEW COMPARISON:  12/30/2016. FINDINGS: The heart size and mediastinal contours are stable. Lung volumes are low with atelectasis at the lung bases. No consolidation, effusion, or pneumothorax is seen. Sternotomy wires are present over the midline. IMPRESSION: Low lung volumes with atelectasis at the  lung bases. Electronically Signed   By: Thornell Sartorius M.D.   On: 08/04/2023 21:12   CT ABDOMEN PELVIS W CONTRAST Result Date: 08/04/2023 CLINICAL DATA:  Acute generalized abdominal pain. EXAM: CT ABDOMEN AND PELVIS WITH CONTRAST TECHNIQUE: Multidetector CT imaging of the abdomen and pelvis was performed using the standard protocol following bolus administration of intravenous contrast. RADIATION DOSE REDUCTION: This exam was performed according  to the departmental dose-optimization program which includes automated exposure control, adjustment of the mA and/or kV according to patient size and/or use of iterative reconstruction technique. CONTRAST:  75mL OMNIPAQUE IOHEXOL 350 MG/ML SOLN COMPARISON:  May 21, 2022. FINDINGS: Lower chest: No acute abnormality. Hepatobiliary: Hepatic steatosis. No cholelithiasis or biliary dilatation. Pancreas: Unremarkable. No pancreatic ductal dilatation or surrounding inflammatory changes. Spleen: Normal in size without focal abnormality. Adrenals/Urinary Tract: Adrenal glands are unremarkable. Kidneys are normal, without renal calculi, focal lesion, or hydronephrosis. Bladder is unremarkable. Stomach/Bowel: Stomach is within normal limits. Appendix appears normal. No evidence of bowel wall thickening, distention, or inflammatory changes. Vascular/Lymphatic: Aortic atherosclerosis. No enlarged abdominal or pelvic lymph nodes. Reproductive: Prostate is unremarkable. Other: No abdominal wall hernia or abnormality. No abdominopelvic ascites. Musculoskeletal: Status post surgical posterior fusion of L5-S1. No acute osseous abnormalities noted. IMPRESSION: Hepatic steatosis. No acute abnormality seen in the abdomen or pelvis. Aortic Atherosclerosis (ICD10-I70.0). Electronically Signed   By: Lupita Raider M.D.   On: 08/04/2023 17:36    Impression/Plan: Intractable nausea and vomiting without reported black or red vomitus. Due to intolerance of clear liquids and recurrent N/V  will do EGD tomorrow to evaluate for peptic ulcer disease and gastric outlet obstruction. Suspect his diabetes and THC use may be playing a role but acute change needs to be further evaluated. NPO p MN for EGD. Supportive care.    LOS: 0 days   Shirley Friar  08/05/2023, 12:03 PM  Questions please call [phone]

## 2023-08-05 NOTE — Progress Notes (Addendum)
 Patient initial systolic blood pressure was 210 and currently it is 163 which is appropriately controlled. Goal to improve systolic blood pressure 25% in next 24 hours to prevent hypoperfusion related ischemic stroke. Continue IV hydralazine 20 mg every 6 as needed and giving labetalol 10 mg once.  Tereasa Coop, MD [facility] 08/05/2023, 2:53 AM

## 2023-08-05 NOTE — Consult Note (Addendum)
 Cardiology Consultation   Patient ID: [name] [name] MRN: [id number]; DOB: [birth year]  Admit date: 08/04/2023 Date of Consult: 08/05/2023  PCP:  [name] Mask, MD   [facility] Providers Cardiologist:  [name] Herrlich, MD   {   Patient Profile:   [name] [name] is a 63 y.o. male with a hx of CAD s/p DES to RCA in 2005 and CABG x4 in 2018 (LIMA to LAD, SVG to ramus intermediate, SVG to OM, sequential SVG to PDA and PLB), Post-CABG Afib treated with amiodarone,  HTN , HLD, DM2, anxiety, GERD, Gastroparesis  who is being seen 08/05/2023 for the evaluation of elevated troponin and CP at the request of [name] [name].  History of Present Illness:   [name] [name] is a 63 YOM with the below cardiac history:  2005 Cath : stenting to proximal, mid and distal RCA with Cypher drug-eluting stents 11/12/2016 ECHO: EF 60-65%, mild LVH, G2DD 11/12/2016 Cath: Ost RCA to Dist RCA 80%, Mid RCA to Dist RCA 100%, Prox LAD to Mid LAD 75%, Mid LAD to Dist LAD 75 %, Ost 1st Mrg to 1st Mrg lesion 50 %, 2nd Mrg lesion 99 %, Ost Ramus to Ramus lesion 75 %, Ost 1st Diag to 1st Diag lesion 80 %. Normal LV systolic fucntion and LVEDP. EF 55-65%  11/13/2016 Carotid Doppler:  mild bilateral stenosis 1-39% 11/17/2016 CABG x4 with Dr. Maren [name]: LIMA to LAD, SVG to ramus intermediate, SVG to OM, sequential SVG to PDA and PLB . Post-OP a-fib treated with amiodarone and Xarelto, both d/c due to lack of reoccurrence 11/17/2016 ECHO TEE: Low systolic LV function with EF of 50-55%. Inferior wall mildly hypokinetic. No thrombus or mass. Mild MVR. Normal RV   Last seen in heartcare with Dr. Dulce [name] on 01/22/2022  Presented to ED 08/04/2023 for intractable N/V since Monday night thought to be secondary to gastroparesis due to DM vs cannabinoid hyperemesis. Also reported decreased appetite and chills. ED exam revealed generalized abdominal tenderness. There was also concern overnight for coffee ground emesis, GI has been consulted  and tentative plans for EGD tomorrow.  Prior to Monday he says he was feeling fine  Very active   No CP  CMP notable for K 3.8, Mg 2.2, Glu elevated 180, Cr elevated 1.51, GFR 52, Anion gap elevated 16, and total bilirubin elevated 2.Lactic Acid 2. CBC significant elevation for WBC 14, Hgb 18.9, and HCT 56.   CXR 3/26 noted atelectasis but no acute cardiopulmonary process  Initial troponin  minimally elevated, flat 36 >33 but today Tn elevated 109 and D-dimer 1.29 Initial EKG 3/26: NSR, HR 96, with PAC and Left axis deviation  Repeat EKG 3/27: ST, HR 100,left axis deviation  Repeat  EKG 3/27: ST, HR 106, Mild elevation in III, ST depression V2-V6   Cardiology consulted for elevated troponin's. Reported Left pectoral CP, sharp, 4/10, x 2 hours earlier today, no radiation, less severe than previous MI's, treated with NTG 0.4 x1 with no relief then treated with morphine with relief. CP occurred after sitting up to drink some juice. Reports HA from NTG. Was not given ASA due to concern for GI bleed. He has been vomiting extensively since Monday night but stopped yesterday. He has not been drinking any fluids due to being afraid to put anything on stomach since vomiting. Denies SOB, dizziness, syncope. Reports THC (hemp) use, last time Sunday but does not do regularly. Denies tobacco and significant ETOH. Follows diabetic diet plan. Prior to this  hospitalization, able to work in yard and on cars without any CP.   Past Medical History:  Diagnosis Date   Anxiety    Chronic lower back pain    Coronary artery disease 03/2004   3 stents put in    Depression    Fibromyalgia    GERD (gastroesophageal reflux disease)    pt denies d/t weight loss   History of hiatal hernia    states no longer there per endoscopy   Hyperlipidemia LDL goal <70    Hypertension    Left leg pain    Low back pain    Migraine    "weekly; back in the 1990s" (11/12/2016)   Myocardial infarction (HCC) 2005   Neuroforaminal  stenosis of lumbar spine    L5-S1   [name] (obstructive sleep apnea)    "quit wearing my [name]" (11/12/2016)   Spondylolisthesis, grade 2    Type II diabetes mellitus (HCC) 2010    Past Surgical History:  Procedure Laterality Date   ABDOMINAL EXPOSURE N/A 07/31/2021   Procedure: ABDOMINAL EXPOSURE;  Surgeon: [name] Douglas, MD;  Location: [facility] OR;  Service: Vascular;  Laterality: N/A;   ANTERIOR LUMBAR FUSION N/A 07/31/2021   Procedure: ANTERIOR LUMBAR INTERBODY FUSION LUMBAR 5 - SACRUM 1 WITH INSTRUMENTATION AND ALLOGRAFT;  Surgeon: [name] Bamberg, MD;  Location: [facility] OR;  Service: Orthopedics;  Laterality: N/A;   CARDIAC CATHETERIZATION  11/12/2016   CORONARY ANGIOPLASTY WITH STENT PLACEMENT  03/2004    x 3 stents   CORONARY ARTERY BYPASS GRAFT N/A 11/17/2016   Procedure: CORONARY ARTERY BYPASS GRAFTING (CABG) x 5, USING RIGHT INTERNAL MAMMARY ARTERY AND RIGHT GREATER SAPHENOUS VEIN HARVESTED ENDOSCOPICALLY;  Surgeon: [name] Perna, MD;  Location: [facility] OR;  Service: Open Heart Surgery;  Laterality: N/A;   INGUINAL HERNIA REPAIR Right 2000   LEFT HEART CATH AND CORONARY ANGIOGRAPHY N/A 11/12/2016   Procedure: Left Heart Cath and Coronary Angiography;  Surgeon: [name] Gess, MD;  Location: [facility] INVASIVE CV LAB;  Service: Cardiovascular;  Laterality: N/A;   TEE WITHOUT CARDIOVERSION N/A 11/17/2016   Procedure: TRANSESOPHAGEAL ECHOCARDIOGRAM (TEE);  Surgeon: [name] [name], [name] Arista, MD;  Location: [facility] OR;  Service: Open Heart Surgery;  Laterality: N/A;     Home Medications:  Prior to Admission medications   Medication Sig Start Date End Date Taking? Authorizing Provider  aspirin EC 81 MG tablet Take 81 mg by mouth daily.   Yes [provider]  Continuous Blood Gluc Transmit (DEXCOM G6 TRANSMITTER) MISC Change every 10 days as needed/directed 02/13/20  Yes [name], [name] Dolores, MD  escitalopram (LEXAPRO) 20 MG tablet Take 20 mg by mouth daily. 07/12/23  Yes [provider]   gabapentin (NEURONTIN) 600 MG tablet Take 2 tablets by mouth every 12 (twelve) hours. 06/04/23  Yes [provider]  insulin glargine (LANTUS SOLOSTAR) 100 UNIT/ML Solostar Pen Inject 80 Units into the skin daily.   Yes [provider]  insulin lispro (HUMALOG KWIKPEN) 200 UNIT/ML KwikPen Max daily 110 units Patient taking differently: Inject 25 Units into the skin in the morning, at noon, and at bedtime. Max daily 110 units 09/23/22  Yes [name], [name] Dolores, MD  lisinopril (ZESTRIL) 10 MG tablet Take 10 mg by mouth daily. 01/16/22  Yes [provider]  metoprolol tartrate (LOPRESSOR) 25 MG tablet Take 1 tablet (25 mg total) by mouth 2 (two) times daily. 11/25/16  Yes Doree Fudge M, PA-C  rosuvastatin (CRESTOR) 40 MG tablet Take 1 tablet (40 mg  total) by mouth daily. Patient needs appointment for further refills. 3 rd/final attempt 04/05/23  Yes Munley, Iline Oven, MD  Insulin Pen Needle 29G X MISC 1 Device by Does not apply route in the morning, at noon, in the evening, and at bedtime. 03/25/21   [name], [name] Dolores, MD  OneTouch Delica Lancets 30G MISC 1 each by Does not apply route in the morning, at noon, in the evening, and at bedtime. Use Onetouch Delica lancets to check blood sugar 3-4 times daily.    [provider]  ONETOUCH ULTRA test strip 1 EACH BY OTHER ROUTE 3 (THREE) TIMES DAILY. USE ONETOUCH ULTRA TEST STRIPS AS INSTRUCTED TO CHECK BLOOD SUGAR 3-4 TIMES DAILY. 09/11/20   [name], [name] Dolores, MD    Inpatient Medications: Scheduled Meds:  gabapentin  1,200 mg Oral Q12H   insulin aspart  0-9 Units Subcutaneous Q4H   insulin glargine-yfgn  40 Units Subcutaneous QHS   metoprolol tartrate  25 mg Oral BID   pantoprazole (PROTONIX) IV  40 mg Intravenous Q12H   rosuvastatin  40 mg Oral Daily   Continuous Infusions:  sodium chloride 80 mL/hr at 08/05/23 1148   PRN Meds: acetaminophen **OR** acetaminophen, alum & mag  hydroxide-simeth, HYDROcodone-acetaminophen, metoCLOPramide, morphine injection, ondansetron **OR** ondansetron (ZOFRAN) IV, phenol  Allergies:   No Known Allergies  Social History:   Social History   Socioeconomic History   Marital status: Divorced    Spouse name: Not on file   Number of children: 1    Years of education: 12    Highest education level: Not on file  Occupational History   Occupation: Unemployed   Tobacco Use   Smoking status: Never   Smokeless tobacco: Never  Vaping Use   Vaping status: Never Used  Substance and Sexual Activity   Alcohol use: Yes    Alcohol/week: 2.0 standard drinks of alcohol    Types: 2 Cans of beer per week   Drug use: Yes    Types: Marijuana    Comment: once a month- smoking marijuana   Sexual activity: Never  Other Topics Concern   Not on file  Social History Narrative   Lives alone.    Daughter lives nearby,5 miles away   Sister next door.    1 grandchild, 38 yo granddaughter    Social Drivers of Corporate investment banker Strain: Not on file  Food Insecurity: No Food Insecurity (08/04/2023)   Hunger Vital Sign    Worried About Running Out of Food in the Last Year: Never true    Ran Out of Food in the Last Year: Never true  Transportation Needs: No Transportation Needs (08/04/2023)   PRAPARE - Administrator, Civil Service (Medical): No    Lack of Transportation (Non-Medical): No  Physical Activity: Not on file  Stress: Not on file  Social Connections: Not on file  Intimate Partner Violence: Not At Risk (08/04/2023)   Humiliation, Afraid, Rape, and Kick questionnaire    Fear of Current or Ex-Partner: No    Emotionally Abused: No    Physically Abused: No    Sexually Abused: No    Family History:   Family History  Problem Relation Age of Onset   Diabetes Father    Diabetes Sister    Diabetes Paternal Grandfather    Cancer Neg Hx      ROS:  Please see the history of present illness.  All other ROS  reviewed and negative.  Physical Exam/Data:   Vitals:   08/05/23 1202 08/05/23 1247 08/05/23 1303 08/05/23 1312  BP: (!) 169/95 123/81 133/80 123/84  Pulse:  (!) 113 (!) 113 (!) 105  Resp:  20  20  Temp:  98 F (36.7 C)    TempSrc:  Oral    SpO2:  99% 98% 99%  Weight:      Height:        Intake/Output Summary (Last 24 hours) at 08/05/2023 1512 Last data filed at 08/05/2023 0641 Gross per 24 hour  Intake 730.66 ml  Output 765 ml  Net -34.34 ml      08/05/2023    3:13 AM 08/04/2023   12:48 PM 09/23/2022    1:21 PM  Last 3 Weights  Weight (lbs) 218 lb 7.6 oz 222 lb 232 lb  Weight (kg) 99.1 kg 100.699 kg 105.235 kg     Body mass index is 30.47 kg/m.  General:  Laying in bed, in no acute distress. Daughter and Wife at beside.  HEENT: normal Neck: no JVD Vascular: No carotid bruits; Distal pulses 2+ bilaterally Cardiac:  normal S1, S2; RRR; no murmur Lungs:  clear to auscultation bilaterally, no wheezing, rhonchi or rales  Abd: soft, nontender, no hepatomegaly  Ext: no edema Musculoskeletal:  No deformities, BUE and BLE strength normal and equal Skin: warm and dry  Neuro:  CNs 2-12 intact, no focal abnormalities noted Psych:  Normal affect   EKG:  The EKG was personally reviewed and demonstrates:   Initial EKG 3/26: NSR, HR 96, with PAC and Left axis deviation  Repeat EKG 3/27: ST, HR 100,left axis deviation  Repeat  EKG 3/27: ST, HR 106, Mild ST elevation in III, ST depression V2-V6  Telemetry:  Telemetry was personally reviewed and demonstrates:  ST, HR 90-100's with PVC's   Relevant CV Studies: Cath 11/12/2016 Ost RCA to Dist RCA lesion, 80 %stenosed. Mid RCA to Dist RCA lesion, 100 %stenosed. Prox LAD to Mid LAD lesion, 75 %stenosed. Mid LAD to Dist LAD lesion, 75 %stenosed. Ost 1st Mrg to 1st Mrg lesion, 50 %stenosed. 2nd Mrg lesion, 99 %stenosed. Ost Ramus to Ramus lesion, 75 %stenosed. Ost 1st Diag to 1st Diag lesion, 80 %stenosed. The left ventricular  systolic function is normal. LV end diastolic pressure is normal. The left ventricular ejection fraction is 55-65% by visual estimate. Final impression: Mr. Saintjean has three-vessel disease with preserved left ventricular function. He is diabetic. He will need coronary artery bypass grafting. The radial sheath was removed and a TR band was placed in the right wrist. She became hemostasis. The femoral sheath will be removed and pressure held. Patient was gently hydrated. Heparin will be restarted in 6 hours per pharmacy. TCTS  was notified. The patient left the lab in stable condition.  Diagnostic Dominance: Right   ECHO TEE 11/17/2016  Left ventricle: Normal cavity size and left ventricular diastolic  function. LV systolic function is low normal with an EF of 50-55%. Wall  motion is abnormal. Inferior wall slight RWMA with scar Inferior wall  motion is mildly hypokinetic. No thrombus present. No mass present.   Mitral valve: Mild regurgitation.   Right ventricle: Normal cavity size, wall thickness and ejection  fraction.   Laboratory Data:  High Sensitivity Troponin:   Recent Labs  Lab 08/04/23 1955 08/04/23 2054 08/05/23 1348  TROPONINIHS 36* 33* 109*     Chemistry Recent Labs  Lab 08/04/23 1253 08/04/23 1955 08/04/23 2035 08/05/23 1103  NA 142  --  141 143  K 3.8  --  3.6 3.8  CL 102  --  103 105  CO2 24  --  21* 24  GLUCOSE 180*  --  272* 182*  BUN 21  --  23 18  CREATININE 1.51*  --  1.42* 1.26*  CALCIUM 10.2  --  9.5 9.6  MG  --  2.2  --  2.3  GFRNONAA 52*  --  56* >60  ANIONGAP 16*  --  17* 14    Recent Labs  Lab 08/04/23 1253 08/05/23 1103  PROT 7.8 7.8  ALBUMIN 4.6 4.4  AST 28 29  ALT 31 24  ALKPHOS 80 70  BILITOT 2.1* 1.8*   Lipids No results for input(s): "CHOL", "TRIG", "HDL", "LABVLDL", "LDLCALC", "CHOLHDL" in the last 168 hours.  Hematology Recent Labs  Lab 08/05/23 0301 08/05/23 1103 08/05/23 1348  WBC 13.9* 14.7* 14.0*  RBC 6.08* 6.35*  6.03*  HGB 18.4* 18.9* 18.1*  HCT 53.4* 55.9* 52.5*  MCV 87.8 88.0 87.1  MCH 30.3 29.8 30.0  MCHC 34.5 33.8 34.5  RDW 13.6 13.8 13.8  PLT 225 234 222   Thyroid  Recent Labs  Lab 08/04/23 2054  TSH 3.101    BNPNo results for input(s): "BNP", "PROBNP" in the last 168 hours.  DDimer  Recent Labs  Lab 08/05/23 1348  DDIMER 1.29*     Radiology/Studies:  CT HEAD WO CONTRAST ( ) Result Date: 08/05/2023 CLINICAL DATA:  Headache, new onset (Age >= 51y) EXAM: CT HEAD WITHOUT CONTRAST TECHNIQUE: Contiguous axial images were obtained from the base of the skull through the vertex without intravenous contrast. RADIATION DOSE REDUCTION: This exam was performed according to the departmental dose-optimization program which includes automated exposure control, adjustment of the mA and/or kV according to patient size and/or use of iterative reconstruction technique. COMPARISON:  MRI head June 24, 23. FINDINGS: Brain: No evidence of acute infarction, hemorrhage, hydrocephalus, extra-axial collection or mass lesion/mass effect. Patchy white matter hypodensities are nonspecific but compatible with chronic microvascular ischemic change. Vascular: Residual contrast is likely due to contrasted CT abdomen/pelvis performed earlier today. Skull: No acute fracture. Sinuses/Orbits: Paranasal sinus mucosal thickening. Other: No mastoid effusions. IMPRESSION: No evidence of acute intracranial abnormality. Electronically Signed   By: Feliberto Harts M.D.   On: 08/05/2023 01:32   DG Chest Port 1 View Result Date: 08/04/2023 CLINICAL DATA:  Epigastric pain. EXAM: PORTABLE CHEST 1 VIEW COMPARISON:  12/30/2016. FINDINGS: The heart size and mediastinal contours are stable. Lung volumes are low with atelectasis at the lung bases. No consolidation, effusion, or pneumothorax is seen. Sternotomy wires are present over the midline. IMPRESSION: Low lung volumes with atelectasis at the lung bases. Electronically Signed   By:  Thornell Sartorius M.D.   On: 08/04/2023 21:12   CT ABDOMEN PELVIS W CONTRAST Result Date: 08/04/2023 CLINICAL DATA:  Acute generalized abdominal pain. EXAM: CT ABDOMEN AND PELVIS WITH CONTRAST TECHNIQUE: Multidetector CT imaging of the abdomen and pelvis was performed using the standard protocol following bolus administration of intravenous contrast. RADIATION DOSE REDUCTION: This exam was performed according to the departmental dose-optimization program which includes automated exposure control, adjustment of the mA and/or kV according to patient size and/or use of iterative reconstruction technique. CONTRAST:  75mL OMNIPAQUE IOHEXOL 350 MG/ML SOLN COMPARISON:  May 21, 2022. FINDINGS: Lower chest: No acute abnormality. Hepatobiliary: Hepatic steatosis. No cholelithiasis or biliary dilatation. Pancreas: Unremarkable. No pancreatic ductal dilatation or surrounding inflammatory changes. Spleen: Normal in size without focal abnormality.  Adrenals/Urinary Tract: Adrenal glands are unremarkable. Kidneys are normal, without renal calculi, focal lesion, or hydronephrosis. Bladder is unremarkable. Stomach/Bowel: Stomach is within normal limits. Appendix appears normal. No evidence of bowel wall thickening, distention, or inflammatory changes. Vascular/Lymphatic: Aortic atherosclerosis. No enlarged abdominal or pelvic lymph nodes. Reproductive: Prostate is unremarkable. Other: No abdominal wall hernia or abnormality. No abdominopelvic ascites. Musculoskeletal: Status post surgical posterior fusion of L5-S1. No acute osseous abnormalities noted. IMPRESSION: Hepatic steatosis. No acute abnormality seen in the abdomen or pelvis. Aortic Atherosclerosis (ICD10-I70.0). Electronically Signed   By: Lupita Raider M.D.   On: 08/04/2023 17:36     Assessment and Plan:   Chest pain Troponin Elevation  D-dimer elevated - Initial tn 3/26 flat 36 >33 but today Tn elevated 109>81 - Reported CP x2 hrs today relieved with  morphine. Described as sharp.  No relief with NTG. No vomiting since yesterday.  - Unclear if vomiting related to ACS or gastroparesis vs something else - Elevated Tnmay represent demand ischemia in setting of known significant CAD and acute illness  Not lactic acid elevated Echo ordered  PT having GI eval Further cardiac testing will depend on GI findings and symtpoms   -  Hx CAD s/p DES to RCA in 2005 and CABG x4 in 2018 (LIMA to LAD, SVG to ramus intermediate, SVG to OM, sequential SVG to PDA and PLB) Aortic Atherosclerosis - continue Crestor 40 mg - Not currently taking ASA 81 mg due concern for GI bleed.  HTN urgency - This admission BP elevated to 217/112. Treated with IV labetalol 10 mg and Hydralazine 20 mg IV then returned to 120's/80's. BP elevated to 160/100 while at bedside  - On hold: Lisinopril 10mg  given AKI - Currently on Lopressor 25 mg, ordered Amlodipine 2.5 mg   HLD - Last LDL 2022: 53 - Order for AM labs   DM2 - 09/2022: A1C 8.2  - Currently on Semglee and SSI - managed by primary   AKI  - Baseline Cr 1. Currently 1.5  THC use  - Counseled on cessation this admission   Otherwise managed by primary  - Acidotic  - Intractable N/V - Dark Emesis  - Anxiety  - GERD    Risk Assessment/Risk Scores:     For questions or updates, please contact [facility] Please consult [email or web address] for contact info under    Signed, Basilio Cairo, PA-C  08/05/2023 3:12 PM   Patient seen and examined   I have amended note above by Lajuana Ripple to reflect my findings  Pt si a 63 yo with known CAD   PResents with N/V since Monday    Trivial trop elevation initially Today drank juice, deveoped some CP   Trop 109    Second today was 81  ON exam, pt says he is hot then cold Neck   JVP is normal  Lung are CTA Cardiac exam   RRR   No S3  No murmurs Abd   Mild diffuse tenderness  No masses Chest   Nontender Ext   2+ pulses  No edema Skin warm, pt sl  flushed  Tele:  SR  Short burst SVT   Transient   160 bpm Chest pain   Atypical  Transient  Occurred after drinking    EKGs are with nondiagnostic changes  Trop elevation today was trivial  108, second at 81   Could be seen as demand in setting of acute illness  Await findings of GI evaluation  Dietrich Pates MD

## 2023-08-05 NOTE — Progress Notes (Signed)
 Normal CT head finding.  Tereasa Coop, MD [facility] 08/05/2023, 1:40 AM

## 2023-08-05 NOTE — H&P (View-Only) (Signed)
 Referring Provider: Dr. Caleb Popp Primary Care Physician:  Kaleen Mask, MD Primary Gastroenterologist:  [facility] (Dr. Evette Cristal)  Reason for Consultation:  Intractable nausea and vomiting  HPI: [name] [name] is a 63 y.o. male with acute onset of intractable nausea and vomiting that began Monday night and continued each day even without eating/drinking. Vomitus was initially food-colored and then turned bilious yellow and then brown. Denies black or red vomitus. Denies associated abdominal pain, melena, or hematochezia. Denies dizziness. Reports that cold water will set off his nausea and vomiting. Denies NSAIDs. Abd/pelvis CT negative for acute findings. Occasional alcohol about once per week. Smokes THC (Hemp) and denies other drugs. Normal EGD in 2010. Normal colonoscopy in 11/2019. Family in room.  Past Medical History:  Diagnosis Date   Anxiety    Chronic lower back pain    Coronary artery disease 03/2004   3 stents put in    Depression    Fibromyalgia    GERD (gastroesophageal reflux disease)    pt denies d/t weight loss   History of hiatal hernia    states no longer there per endoscopy   Hyperlipidemia LDL goal <70    Hypertension    Left leg pain    Low back pain    Migraine    "weekly; back in the 1990s" (11/12/2016)   Myocardial infarction (HCC) 2005   Neuroforaminal stenosis of lumbar spine    L5-S1   [name] (obstructive sleep apnea)    "quit wearing my mask" (11/12/2016)   Spondylolisthesis, grade 2    Type II diabetes mellitus (HCC) 2010    Past Surgical History:  Procedure Laterality Date   ABDOMINAL EXPOSURE N/A 07/31/2021   Procedure: ABDOMINAL EXPOSURE;  Surgeon: Leonie Douglas, MD;  Location: [facility] OR;  Service: Vascular;  Laterality: N/A;   ANTERIOR LUMBAR FUSION N/A 07/31/2021   Procedure: ANTERIOR LUMBAR INTERBODY FUSION LUMBAR 5 - SACRUM 1 WITH INSTRUMENTATION AND ALLOGRAFT;  Surgeon: Estill Bamberg, MD;  Location: [facility] OR;  Service: Orthopedics;  Laterality: N/A;    CARDIAC CATHETERIZATION  11/12/2016   CORONARY ANGIOPLASTY WITH STENT PLACEMENT  03/2004    x 3 stents   CORONARY ARTERY BYPASS GRAFT N/A 11/17/2016   Procedure: CORONARY ARTERY BYPASS GRAFTING (CABG) x 5, USING RIGHT INTERNAL MAMMARY ARTERY AND RIGHT GREATER SAPHENOUS VEIN HARVESTED ENDOSCOPICALLY;  Surgeon: Kerin Perna, MD;  Location: [facility] OR;  Service: Open Heart Surgery;  Laterality: N/A;   INGUINAL HERNIA REPAIR Right 2000   LEFT HEART CATH AND CORONARY ANGIOGRAPHY N/A 11/12/2016   Procedure: Left Heart Cath and Coronary Angiography;  Surgeon: Runell Gess, MD;  Location: [facility] INVASIVE CV LAB;  Service: Cardiovascular;  Laterality: N/A;   TEE WITHOUT CARDIOVERSION N/A 11/17/2016   Procedure: TRANSESOPHAGEAL ECHOCARDIOGRAM (TEE);  Surgeon: Donata Clay, Theron Arista, MD;  Location: [facility] OR;  Service: Open Heart Surgery;  Laterality: N/A;    Prior to Admission medications   Medication Sig Start Date End Date Taking? Authorizing Provider  aspirin EC 81 MG tablet Take 81 mg by mouth daily.   Yes [provider]  Continuous Blood Gluc Transmit (DEXCOM G6 TRANSMITTER) MISC Change every 10 days as needed/directed 02/13/20  Yes Shamleffer, Konrad Dolores, MD  escitalopram (LEXAPRO) 20 MG tablet Take 20 mg by mouth daily. 07/12/23  Yes [provider]  gabapentin (NEURONTIN) 600 MG tablet Take 2 tablets by mouth every 12 (twelve) hours. 06/04/23  Yes [provider]  insulin glargine (LANTUS SOLOSTAR) 100 UNIT/ML Solostar Pen  Inject 80 Units into the skin daily.   Yes [provider]  insulin lispro (HUMALOG KWIKPEN) 200 UNIT/ML KwikPen Max daily 110 units Patient taking differently: Inject 25 Units into the skin in the morning, at noon, and at bedtime. Max daily 110 units 09/23/22  Yes Shamleffer, Konrad Dolores, MD  lisinopril (ZESTRIL) 10 MG tablet Take 10 mg by mouth daily. 01/16/22  Yes [provider]  metoprolol tartrate (LOPRESSOR) 25 MG tablet Take 1 tablet  (25 mg total) by mouth 2 (two) times daily. 11/25/16  Yes Doree Fudge M, PA-C  rosuvastatin (CRESTOR) 40 MG tablet Take 1 tablet (40 mg total) by mouth daily. Patient needs appointment for further refills. 3 rd/final attempt 04/05/23  Yes Munley, Iline Oven, MD  Insulin Pen Needle 29G X MISC 1 Device by Does not apply route in the morning, at noon, in the evening, and at bedtime. 03/25/21   Shamleffer, Konrad Dolores, MD  OneTouch Delica Lancets 30G MISC 1 each by Does not apply route in the morning, at noon, in the evening, and at bedtime. Use Onetouch Delica lancets to check blood sugar 3-4 times daily.    [provider]  ONETOUCH ULTRA test strip 1 EACH BY OTHER ROUTE 3 (THREE) TIMES DAILY. USE ONETOUCH ULTRA TEST STRIPS AS INSTRUCTED TO CHECK BLOOD SUGAR 3-4 TIMES DAILY. 09/11/20   Shamleffer, Konrad Dolores, MD    Scheduled Meds:  gabapentin  1,200 mg Oral Q12H   insulin aspart  0-9 Units Subcutaneous Q4H   insulin glargine-yfgn  40 Units Subcutaneous QHS   pantoprazole (PROTONIX) IV  40 mg Intravenous Q12H   rosuvastatin  40 mg Oral Daily   Continuous Infusions:  sodium chloride 80 mL/hr at 08/05/23 1148   PRN Meds:.acetaminophen **OR** acetaminophen, fentaNYL (SUBLIMAZE) injection, hydrALAZINE, HYDROcodone-acetaminophen, metoCLOPramide, ondansetron **OR** ondansetron (ZOFRAN) IV, phenol  Allergies as of 08/04/2023   (No Known Allergies)    Family History  Problem Relation Age of Onset   Diabetes Father    Diabetes Sister    Diabetes Paternal Grandfather    Cancer Neg Hx     Social History   Socioeconomic History   Marital status: Divorced    Spouse name: Not on file   Number of children: 1    Years of education: 12    Highest education level: Not on file  Occupational History   Occupation: Unemployed   Tobacco Use   Smoking status: Never   Smokeless tobacco: Never  Vaping Use   Vaping status: Never Used  Substance and Sexual Activity   Alcohol  use: Yes    Alcohol/week: 2.0 standard drinks of alcohol    Types: 2 Cans of beer per week   Drug use: Yes    Types: Marijuana    Comment: once a month- smoking marijuana   Sexual activity: Never  Other Topics Concern   Not on file  Social History Narrative   Lives alone.    Daughter lives nearby,5 miles away   Sister next door.    1 grandchild, 26 yo granddaughter    Social Drivers of Corporate investment banker Strain: Not on file  Food Insecurity: No Food Insecurity (08/04/2023)   Hunger Vital Sign    Worried About Running Out of Food in the Last Year: Never true    Ran Out of Food in the Last Year: Never true  Transportation Needs: No Transportation Needs (08/04/2023)   PRAPARE - Administrator, Civil Service (Medical): No  Lack of Transportation (Non-Medical): No  Physical Activity: Not on file  Stress: Not on file  Social Connections: Not on file  Intimate Partner Violence: Not At Risk (08/04/2023)   Humiliation, Afraid, Rape, and Kick questionnaire    Fear of Current or Ex-Partner: No    Emotionally Abused: No    Physically Abused: No    Sexually Abused: No    Review of Systems: All negative except as stated above in HPI.  Physical Exam: Vital signs: Vitals:   08/05/23 0815 08/05/23 1202  BP: 123/89 (!) 169/95  Pulse: 99   Resp: 16   Temp:    SpO2: 95%   T 98  Last BM Date : 08/01/23 General:   lethargic, well-nourished, no acute distress, pleasant  Head: normocephalic, atraumatic Eyes: anicteric sclera ENT: oropharynx clear Neck: supple, nontender Lungs:  Clear throughout to auscultation.   No wheezes, crackles, or rhonchi. No acute distress. Heart:  Regular rate and rhythm; no murmurs, clicks, rubs,  or gallops. Abdomen: soft, nontender, nondistended, +BS  Rectal:  Deferred Ext: no edema  GI:  Lab Results: Recent Labs    08/04/23 2035 08/05/23 0301 08/05/23 1103  WBC 15.5* 13.9* 14.7*  HGB 19.2* 18.4* 18.9*  HCT 55.4* 53.4*  55.9*  PLT 226 225 234   BMET Recent Labs    08/04/23 1253 08/04/23 2035  NA 142 141  K 3.8 3.6  CL 102 103  CO2 24 21*  GLUCOSE 180* 272*  BUN 21 23  CREATININE 1.51* 1.42*  CALCIUM 10.2 9.5   LFT Recent Labs    08/04/23 1253  PROT 7.8  ALBUMIN 4.6  AST 28  ALT 31  ALKPHOS 80  BILITOT 2.1*   PT/INR No results for input(s): "LABPROT", "INR" in the last 72 hours.   Studies/Results: CT HEAD WO CONTRAST ( ) Result Date: 08/05/2023 CLINICAL DATA:  Headache, new onset (Age >= 51y) EXAM: CT HEAD WITHOUT CONTRAST TECHNIQUE: Contiguous axial images were obtained from the base of the skull through the vertex without intravenous contrast. RADIATION DOSE REDUCTION: This exam was performed according to the departmental dose-optimization program which includes automated exposure control, adjustment of the mA and/or kV according to patient size and/or use of iterative reconstruction technique. COMPARISON:  MRI head June 24, 23. FINDINGS: Brain: No evidence of acute infarction, hemorrhage, hydrocephalus, extra-axial collection or mass lesion/mass effect. Patchy white matter hypodensities are nonspecific but compatible with chronic microvascular ischemic change. Vascular: Residual contrast is likely due to contrasted CT abdomen/pelvis performed earlier today. Skull: No acute fracture. Sinuses/Orbits: Paranasal sinus mucosal thickening. Other: No mastoid effusions. IMPRESSION: No evidence of acute intracranial abnormality. Electronically Signed   By: Feliberto Harts M.D.   On: 08/05/2023 01:32   DG Chest Port 1 View Result Date: 08/04/2023 CLINICAL DATA:  Epigastric pain. EXAM: PORTABLE CHEST 1 VIEW COMPARISON:  12/30/2016. FINDINGS: The heart size and mediastinal contours are stable. Lung volumes are low with atelectasis at the lung bases. No consolidation, effusion, or pneumothorax is seen. Sternotomy wires are present over the midline. IMPRESSION: Low lung volumes with atelectasis at the  lung bases. Electronically Signed   By: Thornell Sartorius M.D.   On: 08/04/2023 21:12   CT ABDOMEN PELVIS W CONTRAST Result Date: 08/04/2023 CLINICAL DATA:  Acute generalized abdominal pain. EXAM: CT ABDOMEN AND PELVIS WITH CONTRAST TECHNIQUE: Multidetector CT imaging of the abdomen and pelvis was performed using the standard protocol following bolus administration of intravenous contrast. RADIATION DOSE REDUCTION: This exam was performed according  to the departmental dose-optimization program which includes automated exposure control, adjustment of the mA and/or kV according to patient size and/or use of iterative reconstruction technique. CONTRAST:  75mL OMNIPAQUE IOHEXOL 350 MG/ML SOLN COMPARISON:  May 21, 2022. FINDINGS: Lower chest: No acute abnormality. Hepatobiliary: Hepatic steatosis. No cholelithiasis or biliary dilatation. Pancreas: Unremarkable. No pancreatic ductal dilatation or surrounding inflammatory changes. Spleen: Normal in size without focal abnormality. Adrenals/Urinary Tract: Adrenal glands are unremarkable. Kidneys are normal, without renal calculi, focal lesion, or hydronephrosis. Bladder is unremarkable. Stomach/Bowel: Stomach is within normal limits. Appendix appears normal. No evidence of bowel wall thickening, distention, or inflammatory changes. Vascular/Lymphatic: Aortic atherosclerosis. No enlarged abdominal or pelvic lymph nodes. Reproductive: Prostate is unremarkable. Other: No abdominal wall hernia or abnormality. No abdominopelvic ascites. Musculoskeletal: Status post surgical posterior fusion of L5-S1. No acute osseous abnormalities noted. IMPRESSION: Hepatic steatosis. No acute abnormality seen in the abdomen or pelvis. Aortic Atherosclerosis (ICD10-I70.0). Electronically Signed   By: Lupita Raider M.D.   On: 08/04/2023 17:36    Impression/Plan: Intractable nausea and vomiting without reported black or red vomitus. Due to intolerance of clear liquids and recurrent N/V  will do EGD tomorrow to evaluate for peptic ulcer disease and gastric outlet obstruction. Suspect his diabetes and THC use may be playing a role but acute change needs to be further evaluated. NPO p MN for EGD. Supportive care.    LOS: 0 days   Shirley Friar  08/05/2023, 12:03 PM  Questions please call [phone]

## 2023-08-06 ENCOUNTER — Encounter (HOSPITAL_COMMUNITY)
Admission: EM | Disposition: A | Discharge status: Home / Self Care | Payer: Self-pay | Source: Home / Self Care | Attending: Family Medicine

## 2023-08-06 ENCOUNTER — Encounter (HOSPITAL_COMMUNITY): Payer: Self-pay | Admitting: Internal Medicine

## 2023-08-06 ENCOUNTER — Inpatient Hospital Stay (HOSPITAL_COMMUNITY): Admitting: Certified Registered"

## 2023-08-06 ENCOUNTER — Inpatient Hospital Stay (HOSPITAL_COMMUNITY)

## 2023-08-06 DIAGNOSIS — I25119 Atherosclerotic heart disease of native coronary artery with unspecified angina pectoris: Secondary | ICD-10-CM | POA: Diagnosis not present

## 2023-08-06 DIAGNOSIS — R7989 Other specified abnormal findings of blood chemistry: Secondary | ICD-10-CM | POA: Diagnosis not present

## 2023-08-06 DIAGNOSIS — F418 Other specified anxiety disorders: Secondary | ICD-10-CM | POA: Diagnosis not present

## 2023-08-06 DIAGNOSIS — R079 Chest pain, unspecified: Secondary | ICD-10-CM | POA: Diagnosis not present

## 2023-08-06 DIAGNOSIS — I10 Essential (primary) hypertension: Secondary | ICD-10-CM | POA: Diagnosis not present

## 2023-08-06 DIAGNOSIS — K2900 Acute gastritis without bleeding: Secondary | ICD-10-CM

## 2023-08-06 DIAGNOSIS — N179 Acute kidney failure, unspecified: Secondary | ICD-10-CM | POA: Diagnosis not present

## 2023-08-06 HISTORY — PX: ESOPHAGOGASTRODUODENOSCOPY: SHX5428

## 2023-08-06 LAB — CBC WITH DIFFERENTIAL/PLATELET
Abs Immature Granulocytes: 0.05 10*3/uL (ref 0.00–0.07)
Basophils Absolute: 0.1 10*3/uL (ref 0.0–0.1)
Basophils Relative: 1 %
Eosinophils Absolute: 0 10*3/uL (ref 0.0–0.5)
Eosinophils Relative: 0 %
HCT: 56.2 % — ABNORMAL HIGH (ref 39.0–52.0)
Hemoglobin: 19 g/dL — ABNORMAL HIGH (ref 13.0–17.0)
Immature Granulocytes: 0 %
Lymphocytes Relative: 16 %
Lymphs Abs: 2.5 10*3/uL (ref 0.7–4.0)
MCH: 30.1 pg (ref 26.0–34.0)
MCHC: 33.8 g/dL (ref 30.0–36.0)
MCV: 88.9 fL (ref 80.0–100.0)
Monocytes Absolute: 1.8 10*3/uL — ABNORMAL HIGH (ref 0.1–1.0)
Monocytes Relative: 12 %
Neutro Abs: 11.2 10*3/uL — ABNORMAL HIGH (ref 1.7–7.7)
Neutrophils Relative %: 71 %
Platelets: 243 10*3/uL (ref 150–400)
RBC: 6.32 MIL/uL — ABNORMAL HIGH (ref 4.22–5.81)
RDW: 13.7 % (ref 11.5–15.5)
WBC: 15.6 10*3/uL — ABNORMAL HIGH (ref 4.0–10.5)
nRBC: 0 % (ref 0.0–0.2)

## 2023-08-06 LAB — IRON AND TIBC
Iron: 57 ug/dL (ref 45–182)
Saturation Ratios: 16 % — ABNORMAL LOW (ref 17.9–39.5)
TIBC: 367 ug/dL (ref 250–450)
UIBC: 310 ug/dL

## 2023-08-06 LAB — TECHNOLOGIST SMEAR REVIEW: Plt Morphology: NORMAL

## 2023-08-06 LAB — CBC
HCT: 57.1 % — ABNORMAL HIGH (ref 39.0–52.0)
Hemoglobin: 19.2 g/dL — ABNORMAL HIGH (ref 13.0–17.0)
MCH: 30 pg (ref 26.0–34.0)
MCHC: 33.6 g/dL (ref 30.0–36.0)
MCV: 89.2 fL (ref 80.0–100.0)
Platelets: 240 10*3/uL (ref 150–400)
RBC: 6.4 MIL/uL — ABNORMAL HIGH (ref 4.22–5.81)
RDW: 14.1 % (ref 11.5–15.5)
WBC: 16.3 10*3/uL — ABNORMAL HIGH (ref 4.0–10.5)
nRBC: 0 % (ref 0.0–0.2)

## 2023-08-06 LAB — GLUCOSE, CAPILLARY
Glucose-Capillary: 146 mg/dL — ABNORMAL HIGH (ref 70–99)
Glucose-Capillary: 167 mg/dL — ABNORMAL HIGH (ref 70–99)
Glucose-Capillary: 180 mg/dL — ABNORMAL HIGH (ref 70–99)
Glucose-Capillary: 187 mg/dL — ABNORMAL HIGH (ref 70–99)
Glucose-Capillary: 201 mg/dL — ABNORMAL HIGH (ref 70–99)
Glucose-Capillary: 205 mg/dL — ABNORMAL HIGH (ref 70–99)
Glucose-Capillary: 251 mg/dL — ABNORMAL HIGH (ref 70–99)

## 2023-08-06 LAB — COMPREHENSIVE METABOLIC PANEL WITH GFR
ALT: 24 U/L (ref 0–44)
AST: 36 U/L (ref 15–41)
Albumin: 4.1 g/dL (ref 3.5–5.0)
Alkaline Phosphatase: 77 U/L (ref 38–126)
Anion gap: 14 (ref 5–15)
BUN: 18 mg/dL (ref 8–23)
CO2: 21 mmol/L — ABNORMAL LOW (ref 22–32)
Calcium: 9.4 mg/dL (ref 8.9–10.3)
Chloride: 106 mmol/L (ref 98–111)
Creatinine, Ser: 1.05 mg/dL (ref 0.61–1.24)
GFR, Estimated: >60 mL/min (ref >60)
Glucose, Bld: 183 mg/dL — ABNORMAL HIGH (ref 70–99)
Potassium: 4.2 mmol/L (ref 3.5–5.1)
Sodium: 141 mmol/L (ref 135–145)
Total Bilirubin: 1.7 mg/dL — ABNORMAL HIGH (ref 0.0–1.2)
Total Protein: 6.9 g/dL (ref 6.5–8.1)

## 2023-08-06 LAB — LIPID PANEL
Cholesterol: 95 mg/dL (ref 0–200)
HDL: 31 mg/dL — ABNORMAL LOW (ref >40)
LDL Cholesterol: 39 mg/dL (ref 0–99)
Total CHOL/HDL Ratio: 3.1 ratio
Triglycerides: 124 mg/dL (ref <150)
VLDL: 25 mg/dL (ref 0–40)

## 2023-08-06 LAB — FERRITIN: Ferritin: 151 ng/mL (ref 24–336)

## 2023-08-06 LAB — LACTATE DEHYDROGENASE: LDH: 199 U/L — ABNORMAL HIGH (ref 98–192)

## 2023-08-06 SURGERY — EGD (ESOPHAGOGASTRODUODENOSCOPY)
Anesthesia: Monitor Anesthesia Care

## 2023-08-06 MED ORDER — METOPROLOL TARTRATE 50 MG PO TABS
50.0000 mg | ORAL_TABLET | Freq: Two times a day (BID) | ORAL | Status: DC
  Administered 2023-08-06 – 2023-08-07 (×2): 50 mg via ORAL
  Filled 2023-08-06 (×2): qty 1

## 2023-08-06 MED ORDER — SUCRALFATE 1 GM/10ML PO SUSP
1.0000 g | Freq: Three times a day (TID) | ORAL | Status: DC
  Administered 2023-08-06 – 2023-08-07 (×4): 1 g via ORAL
  Filled 2023-08-06 (×4): qty 10

## 2023-08-06 MED ORDER — DILTIAZEM HCL-DEXTROSE 125-5 MG/125ML-% IV SOLN (PREMIX)
5.0000 mg/h | INTRAVENOUS | Status: DC
  Filled 2023-08-06: qty 125

## 2023-08-06 MED ORDER — PROPOFOL 500 MG/50ML IV EMUL
INTRAVENOUS | Status: DC | PRN
  Administered 2023-08-06: 100 ug/kg/min via INTRAVENOUS

## 2023-08-06 MED ORDER — LABETALOL HCL 5 MG/ML IV SOLN: 10.0000 mg | INTRAVENOUS | Status: DC | PRN

## 2023-08-06 MED ORDER — METOPROLOL TARTRATE 25 MG PO TABS
25.0000 mg | ORAL_TABLET | Freq: Two times a day (BID) | ORAL | Status: DC
  Administered 2023-08-06: 25 mg via ORAL
  Filled 2023-08-06: qty 1

## 2023-08-06 MED ORDER — HYDRALAZINE HCL 20 MG/ML IJ SOLN: 10.0000 mg | Freq: Four times a day (QID) | INTRAMUSCULAR | Status: DC | PRN

## 2023-08-06 MED ORDER — SODIUM CHLORIDE 0.9 % IV SOLN
INTRAVENOUS | Status: DC | PRN
  Administered 2023-08-06: 500 mL via INTRAMUSCULAR

## 2023-08-06 MED ORDER — SODIUM CHLORIDE 0.9 % IV SOLN: INTRAVENOUS | Status: DC

## 2023-08-06 MED ORDER — PROPOFOL 10 MG/ML IV BOLUS
INTRAVENOUS | Status: DC | PRN
  Administered 2023-08-06: 150 mg via INTRAVENOUS

## 2023-08-06 MED ORDER — LABETALOL HCL 5 MG/ML IV SOLN: 10.0000 mg | Freq: Three times a day (TID) | INTRAVENOUS | Status: DC | PRN

## 2023-08-06 MED ORDER — PHENYLEPHRINE HCL (PRESSORS) 10 MG/ML IV SOLN
INTRAVENOUS | Status: DC | PRN
  Administered 2023-08-06: 160 ug via INTRAVENOUS

## 2023-08-06 MED ORDER — DILTIAZEM LOAD VIA INFUSION
20.0000 mg | INTRAVENOUS | Status: DC
  Filled 2023-08-06: qty 20

## 2023-08-06 NOTE — Progress Notes (Addendum)
 Rounding Note    Patient Name: [name] [name] Date of Encounter: 08/06/2023  [facility] Cardiologist: Norman Herrlich, MD   Subjective   Pt feeling much better  No N/V  no CP    Inpatient Medications    Scheduled Meds:  amLODipine  2.5 mg Oral Daily   gabapentin  1,200 mg Oral Q12H   insulin aspart  0-9 Units Subcutaneous Q4H   insulin glargine-yfgn  40 Units Subcutaneous QHS   metoprolol tartrate  25 mg Oral BID   pantoprazole (PROTONIX) IV  40 mg Intravenous Q12H   rosuvastatin  40 mg Oral Daily   sucralfate  1 g Oral TID WC & HS   Continuous Infusions:  PRN Meds: acetaminophen **OR** acetaminophen, alum & mag hydroxide-simeth, HYDROcodone-acetaminophen, labetalol, metoCLOPramide, morphine injection, ondansetron **OR** ondansetron (ZOFRAN) IV, phenol   Vital Signs    Vitals:   08/06/23 0835 08/06/23 0840 08/06/23 0858 08/06/23 1119  BP: (!) 131/93 (!) 140/96 (!) 175/88 (!) 180/102  Pulse: 75 77 80 82  Resp: (!) 23 18 18 18   Temp:   99 F (37.2 C)   TempSrc:   Oral   SpO2: 95% 96% 96% 97%  Weight:      Height:        Intake/Output Summary (Last 24 hours) at 08/06/2023 1439 Last data filed at 08/06/2023 0816 Gross per 24 hour  Intake 200 ml  Output 875 ml  Net -675 ml      08/06/2023    4:14 AM 08/05/2023    3:13 AM 08/04/2023   12:48 PM  Last 3 Weights  Weight (lbs) 218 lb 7.6 oz 218 lb 7.6 oz 222 lb  Weight (kg) 99.1 kg 99.1 kg 100.699 kg      Telemetry    SR and atrial fibrillation with RVR   Rates 80s to 140   - Personally Reviewed  ECG    - Personally Reviewed  Physical Exam   GEN: No acute distress.   Neck: No JVD Cardiac: RRR, no murmurs,  Respiratory: Clear to auscultation  GI: Soft, nontender, non-distended  MS: No edema; No deformity.   Labs    High Sensitivity Troponin:   Recent Labs  Lab 08/04/23 1955 08/04/23 2054 08/05/23 1348 08/05/23 1628  TROPONINIHS 36* 33* 109* 81*     Chemistry Recent Labs  Lab  08/04/23 1253 08/04/23 1955 08/04/23 2035 08/05/23 1103 08/06/23 0720  NA 142  --  141 143 141  K 3.8  --  3.6 3.8 4.2  CL 102  --  103 105 106  CO2 24  --  21* 24 21*  GLUCOSE 180*  --  272* 182* 183*  BUN 21  --  23 18 18   CREATININE 1.51*  --  1.42* 1.26* 1.05  CALCIUM 10.2  --  9.5 9.6 9.4  MG  --  2.2  --  2.3  --   PROT 7.8  --   --  7.8 6.9  ALBUMIN 4.6  --   --  4.4 4.1  AST 28  --   --  29 36  ALT 31  --   --  24 24  ALKPHOS 80  --   --  70 77  BILITOT 2.1*  --   --  1.8* 1.7*  GFRNONAA 52*  --  56* >60 >60  ANIONGAP 16*  --  17* 14 14    Lipids  Recent Labs  Lab 08/06/23 0312  CHOL 95  TRIG 124  HDL 31*  LDLCALC 39  CHOLHDL 3.1    Hematology Recent Labs  Lab 08/05/23 1103 08/05/23 1348 08/06/23 0312  WBC 14.7* 14.0* 15.6*  16.3*  RBC 6.35* 6.03* 6.32*  6.40*  HGB 18.9* 18.1* 19.0*  19.2*  HCT 55.9* 52.5* 56.2*  57.1*  MCV 88.0 87.1 88.9  89.2  MCH 29.8 30.0 30.1  30.0  MCHC 33.8 34.5 33.8  33.6  RDW 13.8 13.8 13.7  14.1  PLT 234 222 243  240   Thyroid  Recent Labs  Lab 08/04/23 2054  TSH 3.101    BNPNo results for input(s): "BNP", "PROBNP" in the last 168 hours.  DDimer  Recent Labs  Lab 08/05/23 1348  DDIMER 1.29*     Radiology    CT Angio Chest Pulmonary Embolism (PE) W or WO Contrast Result Date: 08/05/2023 CLINICAL DATA:  Positive D-dimer. Chest pain. Concern for pulmonary embolism. EXAM: CT ANGIOGRAPHY CHEST WITH CONTRAST TECHNIQUE: Multidetector CT imaging of the chest was performed using the standard protocol during bolus administration of intravenous contrast. Multiplanar CT image reconstructions and MIPs were obtained to evaluate the vascular anatomy. RADIATION DOSE REDUCTION: This exam was performed according to the departmental dose-optimization program which includes automated exposure control, adjustment of the mA and/or kV according to patient size and/or use of iterative reconstruction technique. CONTRAST:  75mL  OMNIPAQUE IOHEXOL 350 MG/ML SOLN COMPARISON:  Chest radiograph dated 08/04/2023. FINDINGS: Cardiovascular: There is no cardiomegaly or pericardial effusion. There is 3 vessel coronary vascular calcification and postsurgical changes of CABG. Mild atherosclerotic calcification of the thoracic aorta. No aneurysmal dilatation or dissection. The origins of the great vessels of the aortic arch appear patent. No pulmonary artery embolus identified. Mediastinum/Nodes: No hilar or mediastinal adenopathy. The esophagus is grossly unremarkable. No mediastinal fluid collection. Lungs/Pleura: No focal consolidation, pleural effusion, pneumothorax. The central airways are patent. Upper Abdomen: No acute abnormality. Musculoskeletal: Median sternotomy wires. No acute osseous pathology. Review of the MIP images confirms the above findings. IMPRESSION: 1. No acute intrathoracic pathology. No CT evidence of pulmonary artery embolus. 2. Coronary vascular calcification and postsurgical changes of CABG. Electronically Signed   By: Elgie Collard M.D.   On: 08/05/2023 18:44   CT HEAD WO CONTRAST ( ) Result Date: 08/05/2023 CLINICAL DATA:  Headache, new onset (Age >= 51y) EXAM: CT HEAD WITHOUT CONTRAST TECHNIQUE: Contiguous axial images were obtained from the base of the skull through the vertex without intravenous contrast. RADIATION DOSE REDUCTION: This exam was performed according to the departmental dose-optimization program which includes automated exposure control, adjustment of the mA and/or kV according to patient size and/or use of iterative reconstruction technique. COMPARISON:  MRI head June 24, 23. FINDINGS: Brain: No evidence of acute infarction, hemorrhage, hydrocephalus, extra-axial collection or mass lesion/mass effect. Patchy white matter hypodensities are nonspecific but compatible with chronic microvascular ischemic change. Vascular: Residual contrast is likely due to contrasted CT abdomen/pelvis performed  earlier today. Skull: No acute fracture. Sinuses/Orbits: Paranasal sinus mucosal thickening. Other: No mastoid effusions. IMPRESSION: No evidence of acute intracranial abnormality. Electronically Signed   By: Feliberto Harts M.D.   On: 08/05/2023 01:32   DG Chest Port 1 View Result Date: 08/04/2023 CLINICAL DATA:  Epigastric pain. EXAM: PORTABLE CHEST 1 VIEW COMPARISON:  12/30/2016. FINDINGS: The heart size and mediastinal contours are stable. Lung volumes are low with atelectasis at the lung bases. No consolidation, effusion, or pneumothorax is seen. Sternotomy wires are present over the midline. IMPRESSION: Low lung volumes  with atelectasis at the lung bases. Electronically Signed   By: Thornell Sartorius M.D.   On: 08/04/2023 21:12   CT ABDOMEN PELVIS W CONTRAST Result Date: 08/04/2023 CLINICAL DATA:  Acute generalized abdominal pain. EXAM: CT ABDOMEN AND PELVIS WITH CONTRAST TECHNIQUE: Multidetector CT imaging of the abdomen and pelvis was performed using the standard protocol following bolus administration of intravenous contrast. RADIATION DOSE REDUCTION: This exam was performed according to the departmental dose-optimization program which includes automated exposure control, adjustment of the mA and/or kV according to patient size and/or use of iterative reconstruction technique. CONTRAST:  75mL OMNIPAQUE IOHEXOL 350 MG/ML SOLN COMPARISON:  May 21, 2022. FINDINGS: Lower chest: No acute abnormality. Hepatobiliary: Hepatic steatosis. No cholelithiasis or biliary dilatation. Pancreas: Unremarkable. No pancreatic ductal dilatation or surrounding inflammatory changes. Spleen: Normal in size without focal abnormality. Adrenals/Urinary Tract: Adrenal glands are unremarkable. Kidneys are normal, without renal calculi, focal lesion, or hydronephrosis. Bladder is unremarkable. Stomach/Bowel: Stomach is within normal limits. Appendix appears normal. No evidence of bowel wall thickening, distention, or  inflammatory changes. Vascular/Lymphatic: Aortic atherosclerosis. No enlarged abdominal or pelvic lymph nodes. Reproductive: Prostate is unremarkable. Other: No abdominal wall hernia or abnormality. No abdominopelvic ascites. Musculoskeletal: Status post surgical posterior fusion of L5-S1. No acute osseous abnormalities noted. IMPRESSION: Hepatic steatosis. No acute abnormality seen in the abdomen or pelvis. Aortic Atherosclerosis (ICD10-I70.0). Electronically Signed   By: Lupita Raider M.D.   On: 08/04/2023 17:36    Cardiac Studies   Echo ordered   Patient Profile     [name] [name] is a 63 y.o. male with a hx of CAD s/p DES to RCA in 2005 and CABG x4 in 2018 (LIMA to LAD, SVG to ramus intermediate, SVG to OM, sequential SVG to PDA and PLB), Post-CABG Afib treated with amiodarone,  HTN , HLD, DM2, anxiety, GERD, Gastroparesis  who is being seen 08/05/2023 for the evaluation of elevated troponin and CP at the request of Dr. Caleb Popp.   Assessment & Plan    1  CP / elevated troponin    Pt with atypical pain yesterday   Very minimal elevation in troponin     I do not think he is having active ischemia  Most likely demand ischemia in setting of acidosis, dehydration  2  Hx CAD   DES to RCA 2005; CABG in 2018    ASA held due to GI  Will need to hold further given need for anticoagulation  3  PAF   PT had remote PAF at time of CABG Now here on telemetry he has had periods of afib  (couple hours at most) wit hRVR   Would increase lopressor to 50 bid  Follow HR and BP    Will need to review with GI when OK t ostart Eliquis    (on review:  Sunday 3/30)   4  HTN  BP has been labile, high at times  Follow as titrate meds  Note lisinopril held due to renal insuff  5  REnal  Cr improved at 1.05 today     6 GI  Pt had egd today      Showed erosive esophagitis and gastritis   I have reviewed with Dr Bosie Clos    Recomm, if stable, to wait until SUnday to start Eliquis   7 DM  Follow by IM  7  THC use  Counselled on cessation    For questions or updates, please contact [facility] Please consult [email or web address] for  contact info under        Signed, Dietrich Pates, MD  08/06/2023, 2:39 PM

## 2023-08-06 NOTE — Anesthesia Postprocedure Evaluation (Signed)
 Anesthesia Post Note  Patient: [name] [name]  Procedure(s) Performed: EGD (ESOPHAGOGASTRODUODENOSCOPY)     Patient location during evaluation: PACU Anesthesia Type: MAC Level of consciousness: awake and alert Pain management: pain level controlled Vital Signs Assessment: post-procedure vital signs reviewed and stable Respiratory status: spontaneous breathing, nonlabored ventilation and respiratory function stable Cardiovascular status: blood pressure returned to baseline and stable Postop Assessment: no apparent nausea or vomiting Anesthetic complications: no   No notable events documented.  Last Vitals:  Vitals:   08/06/23 0840 08/06/23 0858  BP: (!) 140/96 (!) 175/88  Pulse: 77 80  Resp: 18 18  Temp:  37.2 C  SpO2: 96% 96%    Last Pain:  Vitals:   08/06/23 0858  TempSrc: Oral  PainSc:                  Lowella Curb

## 2023-08-06 NOTE — Inpatient Diabetes Management (Signed)
 Inpatient Diabetes Program Recommendations  AACE/ADA: New Consensus Statement on Inpatient Glycemic Control (2015)  Target Ranges:  Prepandial:   less than 140 mg/dL      Peak postprandial:   less than 180 mg/dL (1-2 hours)      Critically ill patients:  140 - 180 mg/dL   Lab Results  Component Value Date   GLUCAP 167 (H) 08/06/2023   HGBA1C 8.2 (H) 08/04/2023    Review of Glycemic Control  Latest Reference Range & Units 08/05/23 08:07 08/05/23 11:50 08/05/23 15:48 08/05/23 20:27 08/06/23 00:32 08/06/23 04:17 08/06/23 07:36  Glucose-Capillary 70 - 99 mg/dL 914 (H) 782 (H) 956 (H) 251 (H) 187 (H) 201 (H) 167 (H)   Diabetes history: DM  Outpatient Diabetes medications:  Lantus 80 units daily Humalog 25 units tid with meals   Current orders for Inpatient glycemic control:  Novolog 0-9 units q 4 hours Semglee 40 units daily  Inpatient Diabetes Program Recommendations:    Please consider increasing Semglee to 55 units daily. Also once eating change Novolog correction to tid with meals and HS and add Novolog meal coverage 5 units tid with meals. (Hold if patient eats less than 50%).   Thanks,  Lorenza Cambridge, RN, BC-ADM Inpatient Diabetes Coordinator Pager [phone]  (8a-5p)

## 2023-08-06 NOTE — Progress Notes (Signed)
 Patient is very adamant that he does not want echo.

## 2023-08-06 NOTE — Progress Notes (Addendum)
 PROGRESS NOTE    [name] [name]  ZOX:[id number] DOB: February 21, 1961 DOA: 08/04/2023 PCP: Kaleen Mask, MD   Brief Narrative: [name] [name] is a 63 y.o. male with a history of diabetes mellitus type 2, hypertension, CAD status post PCI/stent and CABG.  Patient presented secondary to recurrent nausea and vomiting with associated dehydration and acute kidney injury.  Patient is on IV fluids.  During hospitalization, patient developed left-sided atypical chest pain concerning with underlying cardiac history. Endoscopy significant for moderate esophagitis.   Assessment and Plan:  AKI Baseline creatinine of about 1. Creatinine of 1.51 on admission. In setting of nausea/vomiting. IV fluids started. Creatinine improved.  Intractable nausea/vomiting Unclear etiology. No associated diarrhea. History of hiatal hernia, per chart review. CT abdomen/pelvis significant for hepatic steatosis, otherwise nothing to explain symptoms. Upper endoscopy performed on 3/27 and was significant for moderate erosive esophagitis with concern for possible Barrett's esophagus; biopsy performed. -Continue Protonix -Continue Sucralfate -Follow-up biopsy -Soft diet  Dark emesis Unclear if this was coffee-ground. Multiple episodes. Patient without significant risk factors for upper GI bleeding. No evidence of bleeding on upper endoscopy. Hemoglobin stable.  CAD History of PCI with stent and CABG. Patient follows with cardiology as an outpatient. Patient is on Crestor and aspirin as an outpatient. Aspirin held secondary to concern for possible GI bleeding. -Continue Crestor  Primary hypertension Patient is on metoprolol as an outpatient which was held on admission held on admission. Patient started on hydralazine IV prn; hydralazine discontinued. -Continue metoprolol and amlodipine; may need to increase amlodipine  Diabetes mellitus type 2 Uncontrolled with hyperglycemia based on hemoglobin A1C of 8.2%.  Patient is on Lantus and Humalog as an outpatient. Blood sugar somewhat uncontrolled for inpatient. -Increase to Semglee 50 units at bedtime -Change to SSI qAC/HS once eating consistently  Paroxysmal atrial fibrillation with RVR Per  cardiology, patient with remote PAF at time of CABG. Patient in/out of atrial fibrillation with repid ventricular response and spontaneous conversion back to sinus rhythm. -Continue metoprolol -Follow-up Transthoracic Echocardiogram -Cardiology recommendations  Polycythemia Remains persistent. Uncelar etiology. Hemoglobin and hematocrit of 19.06/56.2 respectively. -Check erythropoietin, iron, ferritin,, LDH, peripheral blood smear -Possible inpatient vs outpatient hematology consult  Anxiety -Continue Lexapro  GERD -Continue Protonix  Hyperlipidemia -Continue Crestor  Aortic atherosclerosis -Continue Crestor  THC use Cessation counseled on admission..  Chest pain New symptoms. Per patient, likely GI related. EKG with new anterolateral ST-T segment changes. Concerning for possible NSTEMI although symptoms are atypical. No improvement with nitroglycerin. Transthoracic Echocardiogram ordered and is pending. CTA chest negative for acute process. Chest pain resolved. -Morphine IV -Follow-up Transthoracic Echocardiogram   DVT prophylaxis: SCDs Code Status:   Code Status: Full Code Family Communication: Sister at bedside Disposition Plan: Discharge pending ongoing specialist recommendations/management and ability to advance diet   Consultants:  Gastroenterology Cardiology  Procedures:  Upper GI endoscopy  Antimicrobials: None    Subjective: Tired today. No other concerns. Patient in/out of atrial fibrillation overnight.  Objective: BP (!) 145/91 (BP Location: Left Arm)   Pulse 78   Temp 98.2 F (36.8 C) (Oral)   Resp 17   Ht 5\' 11"  (1.803 m)   Wt 99.1 kg   SpO2 94%   BMI 30.47 kg/m   Examination:  General exam: Appears calm  and comfortable Respiratory system: Clear to auscultation. Respiratory effort normal. Cardiovascular system: S1 & S2 heard, RRR.  Gastrointestinal system: Abdomen is nondistended, soft and nontender. Normal bowel sounds heard. Central nervous system: Alert and  oriented. No focal neurological deficits. Musculoskeletal: No edema. No calf tenderness Skin: No cyanosis. No rashes Psychiatry: Judgement and insight appear normal. Mood & affect appropriate.     Data Reviewed: I have personally reviewed following labs and imaging studies  CBC Lab Results  Component Value Date   WBC 16.3 (H) 08/06/2023   RBC 6.40 (H) 08/06/2023   HGB 19.2 (H) 08/06/2023   HCT 57.1 (H) 08/06/2023   MCV 89.2 08/06/2023   MCH 30.0 08/06/2023   PLT 240 08/06/2023   MCHC 33.6 08/06/2023   RDW 14.1 08/06/2023   LYMPHSABS 1.5 08/04/2023   MONOABS 0.9 08/04/2023   EOSABS 0.0 08/04/2023   BASOSABS 0.1 08/04/2023     Last metabolic panel Lab Results  Component Value Date   NA 143 08/05/2023   K 3.8 08/05/2023   CL 105 08/05/2023   CO2 24 08/05/2023   BUN 18 08/05/2023   CREATININE 1.26 (H) 08/05/2023   GLUCOSE 182 (H) 08/05/2023   GFRNONAA >60 08/05/2023   GFRAA >60 11/24/2016   CALCIUM 9.6 08/05/2023   PHOS 3.1 08/05/2023   PROT 7.8 08/05/2023   ALBUMIN 4.4 08/05/2023   LABGLOB 2.3 11/28/2020   AGRATIO 2.1 11/28/2020   BILITOT 1.8 (H) 08/05/2023   ALKPHOS 70 08/05/2023   AST 29 08/05/2023   ALT 24 08/05/2023   ANIONGAP 14 08/05/2023    GFR: Estimated Creatinine Clearance: 72 mL/min (A) (by C-G formula based on SCr of 1.26 mg/dL (H)).  Recent Results (from the past 240 hours)  Resp panel by RT-PCR (RSV, Flu A&B, Covid) Anterior Nasal Swab     Status: None   Collection Time: 08/04/23 12:50 PM   Specimen: Anterior Nasal Swab  Result Value Ref Range Status   SARS Coronavirus 2 by RT PCR NEGATIVE NEGATIVE Final   Influenza A by PCR NEGATIVE NEGATIVE Final   Influenza B by PCR NEGATIVE  NEGATIVE Final    Comment: (NOTE) The Xpert Xpress SARS-CoV-2/FLU/RSV plus assay is intended as an aid in the diagnosis of influenza from Nasopharyngeal swab specimens and should not be used as a sole basis for treatment. Nasal washings and aspirates are unacceptable for Xpert Xpress SARS-CoV-2/FLU/RSV testing.  Fact Sheet for Patients: BloggerCourse.com  Fact Sheet for Healthcare Providers: SeriousBroker.it  This test is not yet approved or cleared by the [place] FDA and has been authorized for detection and/or diagnosis of SARS-CoV-2 by FDA under an Emergency Use Authorization (EUA). This EUA will remain in effect (meaning this test can be used) for the duration of the COVID-19 declaration under Section 564(b)(1) of the Act, 21 U.S.C. section 360bbb-3(b)(1), unless the authorization is terminated or revoked.     Resp Syncytial Virus by PCR NEGATIVE NEGATIVE Final    Comment: (NOTE) Fact Sheet for Patients: BloggerCourse.com  Fact Sheet for Healthcare Providers: SeriousBroker.it  This test is not yet approved or cleared by the [place] FDA and has been authorized for detection and/or diagnosis of SARS-CoV-2 by FDA under an Emergency Use Authorization (EUA). This EUA will remain in effect (meaning this test can be used) for the duration of the COVID-19 declaration under Section 564(b)(1) of the Act, 21 U.S.C. section 360bbb-3(b)(1), unless the authorization is terminated or revoked.  Performed at [facility], [address], [place], [place] [address]       Radiology Studies: CT Angio Chest Pulmonary Embolism (PE) W or WO Contrast Result Date: 08/05/2023 CLINICAL DATA:  Positive D-dimer. Chest pain. Concern for pulmonary embolism.  EXAM: CT ANGIOGRAPHY CHEST WITH CONTRAST TECHNIQUE: Multidetector CT imaging of the chest was performed using the standard  protocol during bolus administration of intravenous contrast. Multiplanar CT image reconstructions and MIPs were obtained to evaluate the vascular anatomy. RADIATION DOSE REDUCTION: This exam was performed according to the departmental dose-optimization program which includes automated exposure control, adjustment of the mA and/or kV according to patient size and/or use of iterative reconstruction technique. CONTRAST:  75mL OMNIPAQUE IOHEXOL 350 MG/ML SOLN COMPARISON:  Chest radiograph dated 08/04/2023. FINDINGS: Cardiovascular: There is no cardiomegaly or pericardial effusion. There is 3 vessel coronary vascular calcification and postsurgical changes of CABG. Mild atherosclerotic calcification of the thoracic aorta. No aneurysmal dilatation or dissection. The origins of the great vessels of the aortic arch appear patent. No pulmonary artery embolus identified. Mediastinum/Nodes: No hilar or mediastinal adenopathy. The esophagus is grossly unremarkable. No mediastinal fluid collection. Lungs/Pleura: No focal consolidation, pleural effusion, pneumothorax. The central airways are patent. Upper Abdomen: No acute abnormality. Musculoskeletal: Median sternotomy wires. No acute osseous pathology. Review of the MIP images confirms the above findings. IMPRESSION: 1. No acute intrathoracic pathology. No CT evidence of pulmonary artery embolus. 2. Coronary vascular calcification and postsurgical changes of CABG. Electronically Signed   By: Elgie Collard M.D.   On: 08/05/2023 18:44   CT HEAD WO CONTRAST ( ) Result Date: 08/05/2023 CLINICAL DATA:  Headache, new onset (Age >= 51y) EXAM: CT HEAD WITHOUT CONTRAST TECHNIQUE: Contiguous axial images were obtained from the base of the skull through the vertex without intravenous contrast. RADIATION DOSE REDUCTION: This exam was performed according to the departmental dose-optimization program which includes automated exposure control, adjustment of the mA and/or kV according  to patient size and/or use of iterative reconstruction technique. COMPARISON:  MRI head June 24, 23. FINDINGS: Brain: No evidence of acute infarction, hemorrhage, hydrocephalus, extra-axial collection or mass lesion/mass effect. Patchy white matter hypodensities are nonspecific but compatible with chronic microvascular ischemic change. Vascular: Residual contrast is likely due to contrasted CT abdomen/pelvis performed earlier today. Skull: No acute fracture. Sinuses/Orbits: Paranasal sinus mucosal thickening. Other: No mastoid effusions. IMPRESSION: No evidence of acute intracranial abnormality. Electronically Signed   By: Feliberto Harts M.D.   On: 08/05/2023 01:32   DG Chest Port 1 View Result Date: 08/04/2023 CLINICAL DATA:  Epigastric pain. EXAM: PORTABLE CHEST 1 VIEW COMPARISON:  12/30/2016. FINDINGS: The heart size and mediastinal contours are stable. Lung volumes are low with atelectasis at the lung bases. No consolidation, effusion, or pneumothorax is seen. Sternotomy wires are present over the midline. IMPRESSION: Low lung volumes with atelectasis at the lung bases. Electronically Signed   By: Thornell Sartorius M.D.   On: 08/04/2023 21:12   CT ABDOMEN PELVIS W CONTRAST Result Date: 08/04/2023 CLINICAL DATA:  Acute generalized abdominal pain. EXAM: CT ABDOMEN AND PELVIS WITH CONTRAST TECHNIQUE: Multidetector CT imaging of the abdomen and pelvis was performed using the standard protocol following bolus administration of intravenous contrast. RADIATION DOSE REDUCTION: This exam was performed according to the departmental dose-optimization program which includes automated exposure control, adjustment of the mA and/or kV according to patient size and/or use of iterative reconstruction technique. CONTRAST:  75mL OMNIPAQUE IOHEXOL 350 MG/ML SOLN COMPARISON:  May 21, 2022. FINDINGS: Lower chest: No acute abnormality. Hepatobiliary: Hepatic steatosis. No cholelithiasis or biliary dilatation. Pancreas:  Unremarkable. No pancreatic ductal dilatation or surrounding inflammatory changes. Spleen: Normal in size without focal abnormality. Adrenals/Urinary Tract: Adrenal glands are unremarkable. Kidneys are normal, without renal calculi, focal lesion, or  hydronephrosis. Bladder is unremarkable. Stomach/Bowel: Stomach is within normal limits. Appendix appears normal. No evidence of bowel wall thickening, distention, or inflammatory changes. Vascular/Lymphatic: Aortic atherosclerosis. No enlarged abdominal or pelvic lymph nodes. Reproductive: Prostate is unremarkable. Other: No abdominal wall hernia or abnormality. No abdominopelvic ascites. Musculoskeletal: Status post surgical posterior fusion of L5-S1. No acute osseous abnormalities noted. IMPRESSION: Hepatic steatosis. No acute abnormality seen in the abdomen or pelvis. Aortic Atherosclerosis (ICD10-I70.0). Electronically Signed   By: Lupita Raider M.D.   On: 08/04/2023 17:36      LOS: 1 day    Jacquelin Hawking, MD [facility] 08/06/2023, 7:22 AM   If 7PM-7AM, please contact night-coverage [email or web address]

## 2023-08-06 NOTE — Progress Notes (Signed)
 Received a page from patient's RN reporting them that patient is declining to take gabapentin for tonight. Acknowledged this information.Marland Kitchen   Tereasa Coop, MD [facility] 08/06/2023, 11:18 PM

## 2023-08-06 NOTE — Anesthesia Preprocedure Evaluation (Signed)
 Anesthesia Evaluation  Patient identified by MRN, date of birth, ID band Patient awake    Reviewed: Allergy & Precautions, NPO status , Patient's Chart, lab work & pertinent test results  Airway Mallampati: II  TM Distance: >3 FB Neck ROM: Full    Dental  (+) Poor Dentition Many broken/chipped teeth:   Pulmonary sleep apnea    Pulmonary exam normal breath sounds clear to auscultation       Cardiovascular hypertension, + angina  + CAD and + Past MI  Normal cardiovascular exam Rhythm:Regular Rate:Normal     Neuro/Psych  Headaches PSYCHIATRIC DISORDERS Anxiety Depression     Neuromuscular disease (radiculopathy)    GI/Hepatic hiatal hernia,GERD  ,,  Endo/Other  diabetes, Type 2    Renal/GU   negative genitourinary   Musculoskeletal  (+)  Fibromyalgia -  Abdominal  (+) + obese  Peds  Hematology   Anesthesia Other Findings   Reproductive/Obstetrics                             Anesthesia Physical Anesthesia Plan  ASA: 3  Anesthesia Plan: MAC   Post-op Pain Management: Minimal or no pain anticipated   Induction: Intravenous  PONV Risk Score and Plan: 1 and Treatment may vary due to age or medical condition and Propofol infusion  Airway Management Planned: Nasal Cannula  Additional Equipment:   Intra-op Plan:   Post-operative Plan:   Informed Consent: I have reviewed the patients History and Physical, chart, labs and discussed the procedure including the risks, benefits and alternatives for the proposed anesthesia with the patient or authorized representative who has indicated his/her understanding and acceptance.     Dental advisory given  Plan Discussed with: CRNA, Anesthesiologist and Surgeon  Anesthesia Plan Comments: (PAT note by [name] Poles, [name] [name]: Follows with cardiology for history of CAD s/p stents 2005 and CABG 2018.  Last seen 07/16/2021 for preop clearance.  Per note,  "Given past medical history and time since last visit, based on ACC/AHA guidelines,[name] D Fieldswould be at acceptable risk for the planned procedure without further cardiovascular testing. The patient was advised that ifhedevelops new symptoms prior to surgery to contact our office to arrange for a follow-up visit, and heverbalized understanding."  History of [name] on CPAP.  Patient stated he discontinued CPAP use after losing weight.  History of poorly controlled insulin-dependent DM2.  Preop A1c 9.0.  History of medication nonadherence.  Last seen by endocrinologist Dr. Lonzo [name] and medication regimen was adjusted.  Preop labs reviewed, unremarkable.  EKG 11/28/2020: NSR.  Rate 71.  LAD.  Inferior infarct, age undetermined.  TEE 11/17/2016:  Left ventricle: Normal cavity size and left ventricular diastolic  function. LV systolic function is low normal with an EF of 50-55%. Wall  motion is abnormal. Inferior wall slight RWMA with scar Inferior wall  motion is mildly hypokinetic. No thrombus present. No mass present.   Mitral valve: Mild regurgitation.   Right ventricle: Normal cavity size, wall thickness and ejection  fraction.  )        Anesthesia Quick Evaluation

## 2023-08-06 NOTE — Op Note (Signed)
 [facility] Patient Name: [name] [name] Procedure Date : 08/06/2023 MRN: [id number] Attending MD: Shirley Friar , MD, [id number] Date of Birth: April 01, 1961 CSN: [id number] Age: 63 Admit Type: Inpatient Procedure:                Upper GI endoscopy Indications:              Nausea with vomiting Providers:                Shirley Friar, MD, Suzy Bouchard, RN, Rozetta Nunnery, Technician Referring MD:             hospital team Medicines:                Propofol per Anesthesia, Monitored Anesthesia Care Complications:            No immediate complications. Estimated Blood Loss:     Estimated blood loss was minimal. Procedure:                Pre-Anesthesia Assessment:                           - Prior to the procedure, a History and Physical                            was performed, and patient medications and                            allergies were reviewed. The patient's tolerance of                            previous anesthesia was also reviewed. The risks                            and benefits of the procedure and the sedation                            options and risks were discussed with the patient.                            All questions were answered, and informed consent                            was obtained. Prior Anticoagulants: The patient has                            taken no anticoagulant or antiplatelet agents. ASA                            Grade Assessment: III - A patient with severe                            systemic disease. After reviewing the risks and  benefits, the patient was deemed in satisfactory                            condition to undergo the procedure.                           After obtaining informed consent, the endoscope was                            passed under direct vision. Throughout the                            procedure, the patient's blood pressure, pulse,  and                            oxygen saturations were monitored continuously. The                            [id number]-[id number] ([id number]) Olympus endoscope was introduced                            through the mouth, and advanced to the second part                            of duodenum. The upper GI endoscopy was                            accomplished without difficulty. The patient                            tolerated the procedure well. Scope In: Scope Out: Findings:      Moderately severe esophagitis (Grade C) with no bleeding was found in       the distal esophagus. Biopsies were taken with a cold forceps for       histology. Estimated blood loss was minimal.      The Z-line was found 40 cm from the incisors.      Segmental moderate inflammation characterized by congestion (edema),       erythema and nodularity was found in the gastric antrum and in the       prepyloric region of the stomach. Biopsies were taken with a cold       forceps for histology. Estimated blood loss was minimal.      The cardia and gastric fundus were normal on retroflexion.      Patchy mild mucosal changes characterized by congestion and erosion were       found in the duodenal bulb.      The exam of the duodenum was otherwise normal. Impression:               - Moderately severe reflux esophagitis with no                            bleeding. Rule out Barrett's esophagus. Biopsied.                           - Z-line,  40 cm from the incisors.                           - Acute gastritis. Biopsied.                           - Mucosal changes in the duodenum. Recommendation:           - Advance diet as tolerated and clear liquid diet.                           - Await pathology results.                           - Observe patient's clinical course.                           - Use sucralfate suspension 1 gram PO QID. Procedure Code(s):        --- Professional ---                           [phone],  Esophagogastroduodenoscopy, flexible,                            transoral; with biopsy, single or multiple Diagnosis Code(s):        --- Professional ---                           R11.2, Nausea with vomiting, unspecified                           K21.00, Gastro-esophageal reflux disease with                            esophagitis, without bleeding                           K29.00, Acute gastritis without bleeding                           K31.89, Other diseases of stomach and duodenum CPT copyright 2022 American Medical Association. All rights reserved. The codes documented in this report are preliminary and upon coder review may  be revised to meet current compliance requirements. Shirley Friar, MD 08/06/2023 8:31:49 AM This report has been signed electronically. Number of Addenda: 0

## 2023-08-06 NOTE — Plan of Care (Signed)
  Problem: Education: Goal: Knowledge of General Education information will improve Description: Including pain rating scale, medication(s)/side effects and non-pharmacologic comfort measures Outcome: Progressing   Problem: Elimination: Goal: Will not experience complications related to urinary retention Outcome: Progressing   Problem: Nutrition: Goal: Adequate nutrition will be maintained Outcome: Not Progressing: Pt not hable to keep food/drink down during meal time. Does not have much appetite

## 2023-08-06 NOTE — Transfer of Care (Signed)
 Immediate Anesthesia Transfer of Care Note  Patient: [name] [name]  Procedure(s) Performed: EGD (ESOPHAGOGASTRODUODENOSCOPY)  Patient Location: PACU and Endoscopy Unit  Anesthesia Type:MAC  Level of Consciousness: drowsy and responds to stimulation  Airway & Oxygen Therapy: Patient Spontanous Breathing and Patient connected to nasal cannula oxygen  Post-op Assessment: Report given to RN and Post -op Vital signs reviewed and stable  Post vital signs: Reviewed and stable  Last Vitals:  Vitals Value Taken Time  BP 93/75 08/06/23 0821  Temp    Pulse 78 08/06/23 0822  Resp 25 08/06/23 0822  SpO2 97 % 08/06/23 0822  Vitals shown include unfiled device data.  Last Pain:  Vitals:   08/06/23 0728  TempSrc: Temporal  PainSc: 0-No pain      Patients Stated Pain Goal: 0 (08/05/23 1817)  Complications: No notable events documented.

## 2023-08-06 NOTE — Progress Notes (Addendum)
 A-fib RVR-resolved Elevated blood pressure Hypertensive urgency-resolved History of CAD status post CABG History of atrial fibrillation post CABG in the past treated with amiodarone - Patient is in and out A-fib RVR and sinus tachycardia.  EKG showing A-fib RVR heart rate 132.  Also patient has elevated blood pressure at the same time. Patient denies any chest pain and chest pressure.  Patient has significant cardiac history including CAD status post stent and CABG.  -Patient has been evaluated by cardiology in the daytime due to chest pain and elevated troponin.  Found to have elevated troponin secondary to demand ischemia in the setting of acuity of the illness.  Currently patient is on Lopressor 25 mg twice daily and amlodipine 2.5 mg daily.  -In the setting of new onset of A-fib RVR holding the oral metoprolol, giving Cardizem bolus 20 mg followed by continue the Cardizem drip.  Cardizem drip would be helpful both for management of atrial fibrillation and elevated blood pressure as well. Continue to Cardizem drip until patient converts to sinus rhythm.  Eventually need to transition to either long-acting Cardizem versus restart oral metoprolol. -High CHA2DS2-VASc DS 2 score 4.  However patient has intractable nausea and vomiting which placing patient high risk for development of GI bleed and patient supposed to be having EGD tomorrow to rule out peptic ulcer/gastric outlet obstruction.  Currently on IV Protonix 40 mg twice daily.  Not initiating heparin drip currently until nausea and vomiting resolved. -Continue cardiac monitoring. - Pending echocardiogram.  Addendum: -RN reported that patient has been converted to sinus rhythm without any intervention.  Holding the Cardizem bolus and drip.  Continue oral metoprolol 25 mg twice daily as per cardiology recommendation in the daytime.

## 2023-08-06 NOTE — Interval H&P Note (Signed)
 History and Physical Interval Note:  08/06/2023 7:51 AM  [name] [name]  has presented today for surgery, with the diagnosis of Nausea/Vomiting.  The various methods of treatment have been discussed with the patient and family. After consideration of risks, benefits and other options for treatment, the patient has consented to  Procedure(s): EGD (ESOPHAGOGASTRODUODENOSCOPY) (N/A) as a surgical intervention.  The patient's history has been reviewed, patient examined, no change in status, stable for surgery.  I have reviewed the patient's chart and labs.  Questions were answered to the patient's satisfaction.     Shirley Friar

## 2023-08-07 ENCOUNTER — Other Ambulatory Visit (HOSPITAL_COMMUNITY): Payer: Self-pay

## 2023-08-07 ENCOUNTER — Other Ambulatory Visit (HOSPITAL_COMMUNITY)

## 2023-08-07 ENCOUNTER — Inpatient Hospital Stay (HOSPITAL_COMMUNITY)

## 2023-08-07 DIAGNOSIS — N179 Acute kidney failure, unspecified: Secondary | ICD-10-CM | POA: Diagnosis not present

## 2023-08-07 LAB — GLUCOSE, CAPILLARY
Glucose-Capillary: 114 mg/dL — ABNORMAL HIGH (ref 70–99)
Glucose-Capillary: 281 mg/dL — ABNORMAL HIGH (ref 70–99)
Glucose-Capillary: 162 mg/dL — ABNORMAL HIGH (ref 70–99)

## 2023-08-07 LAB — CBC
HCT: 51.6 % (ref 39.0–52.0)
Hemoglobin: 17.5 g/dL — ABNORMAL HIGH (ref 13.0–17.0)
MCH: 29.8 pg (ref 26.0–34.0)
MCHC: 33.9 g/dL (ref 30.0–36.0)
MCV: 87.9 fL (ref 80.0–100.0)
Platelets: 209 10*3/uL (ref 150–400)
RBC: 5.87 MIL/uL — ABNORMAL HIGH (ref 4.22–5.81)
RDW: 13.1 % (ref 11.5–15.5)
WBC: 10.1 10*3/uL (ref 4.0–10.5)
nRBC: 0 % (ref 0.0–0.2)

## 2023-08-07 LAB — ERYTHROPOIETIN: Erythropoietin: 2.6 m[IU]/mL (ref 2.6–18.5)

## 2023-08-07 MED ORDER — PANTOPRAZOLE SODIUM 40 MG PO TBEC
DELAYED_RELEASE_TABLET | ORAL | 0 refills | Status: AC
  Filled 2023-08-07: qty 150, 90d supply, fill #0

## 2023-08-07 MED ORDER — APIXABAN 5 MG PO TABS
5.0000 mg | ORAL_TABLET | Freq: Two times a day (BID) | ORAL | 2 refills | Status: AC
Start: 2023-08-07 — End: ?
  Filled 2023-08-07: qty 60, 30d supply, fill #0

## 2023-08-07 MED ORDER — METOPROLOL TARTRATE 50 MG PO TABS
50.0000 mg | ORAL_TABLET | Freq: Two times a day (BID) | ORAL | 2 refills | Status: AC
Start: 2023-08-07 — End: ?
  Filled 2023-08-07: qty 60, 30d supply, fill #0

## 2023-08-07 MED ORDER — SUCRALFATE 1 G PO TABS
1.0000 g | ORAL_TABLET | Freq: Three times a day (TID) | ORAL | 0 refills | Status: AC
Start: 2023-08-07 — End: ?
  Filled 2023-08-07: qty 120, 30d supply, fill #0

## 2023-08-07 MED ORDER — AMLODIPINE BESYLATE 2.5 MG PO TABS
2.5000 mg | ORAL_TABLET | Freq: Every day | ORAL | 2 refills | Status: AC
  Filled 2023-08-07: qty 30, 30d supply, fill #0

## 2023-08-07 NOTE — Progress Notes (Signed)
 [facility] Gastroenterology Progress Note  [name] [name] 63 y.o. 03-16-1961   Subjective: Denies N/V. Denies abdominal pain. Ate french toast this morning without issues.   Objective: Vital signs: Vitals:   08/07/23 0432 08/07/23 0456  BP:  (!) 144/97  Pulse: 71   Resp: 20   Temp: 98 F (36.7 C)   SpO2: 96%     Physical Exam: Gen: lethargic, well-nourished, no acute distress  HEENT: anicteric sclera CV: RRR Chest: CTA B Abd: soft, nontender, nondistended, +BS Ext: no edema  Lab Results: Recent Labs    08/04/23 1955 08/04/23 2035 08/05/23 1103 08/06/23 0720  NA  --    < > 143 141  K  --    < > 3.8 4.2  CL  --    < > 105 106  CO2  --    < > 24 21*  GLUCOSE  --    < > 182* 183*  BUN  --    < > 18 18  CREATININE  --    < > 1.26* 1.05  CALCIUM  --    < > 9.6 9.4  MG 2.2  --  2.3  --   PHOS 3.3  --  3.1  --    < > = values in this interval not displayed.   Recent Labs    08/05/23 1103 08/06/23 0720  AST 29 36  ALT 24 24  ALKPHOS 70 77  BILITOT 1.8* 1.7*  PROT 7.8 6.9  ALBUMIN 4.4 4.1   Recent Labs    08/04/23 1253 08/04/23 2035 08/06/23 0312 08/07/23 0310  WBC 11.7*   < > 15.6*  16.3* 10.1  NEUTROABS 9.2*  --  11.2*  --   HGB 20.1*   < > 19.0*  19.2* 17.5*  HCT 57.2*   < > 56.2*  57.1* 51.6  MCV 86.5   < > 88.9  89.2 87.9  PLT 277   < > 243  240 209   < > = values in this interval not displayed.      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 Doing well on Carafate slurry and PPI IV BID. Will change to PO PPI BID and needs to continue as outpt for 2 months and then daily. Continue Carafate slurry (or tablets if slurry not covered by insurance) with meals and bedtime for one month and cautioned that it might cause constipation and if so to use a stool softener or laxative. Ok to resume [employer]. Avoid NSAIDs. F/U with [facility] as needed. Will sign off. Call if  questions.   Shirley Friar 08/07/2023, 9:27 AM  Questions please call [phone] ID: [name] [name], male   DOB: May 10, 1961, 63 y.o.   MRN: [id number]

## 2023-08-07 NOTE — Progress Notes (Signed)
  Echocardiogram 2D Echocardiogram has been attempted, pt still does not want echo. York Spaniel was waiting to be discharged  Leda Roys RDCS 08/07/2023, 11:48 AM

## 2023-08-07 NOTE — Discharge Instructions (Signed)
 [name] [name],  You were in the hospital because of intractable nausea and vomiting.  This was managed supportively with antinausea medication, IV fluids, acid reducer medication.  You are seen by the GI doctor who performed an upper endoscopy and found a lot of inflammation of your lower esophagus.  He has taken a biopsy of this area and will follow-up with you as an outpatient.  He is recommended medication to help keep that area protected so that it can heal over time.  While you are here, he also had some chest pain and also a fast irregular rhythm called atrial fibrillation.  The cardiologist was consulted and recommended medication changes.  We also recommended a heart ultrasound, for which you declined.  Please follow-up with your primary care physician and the cardiologist.  Please follow-up with the gastroenterologist for your biopsy results.

## 2023-08-07 NOTE — Progress Notes (Signed)
 Patient called for charge nurse to come to room. Charge nurse and primary RN entered room, but patient yelled at primary RN to leave. Patent began yelling and using profanity at charge RN. Charge RN finally able to understand after several minutes of patient yelling that he was upset that cardiologist has not written discharge orders yet. Charge RN contacted Dr. Mal Misty to notify and cardiologist. Cardiologist entered room to assess patient, but patient yelled at her until she left without assessing patient. Discharge orders written after cardiologist cleared patient. Patient apologized to staff on way out.

## 2023-08-07 NOTE — Discharge Summary (Signed)
 Physician Discharge Summary   Patient: [name] [name] MRN: [id number] DOB: August 28, 1960  Admit date:     08/04/2023  Discharge date: 08/07/23  Discharge Physician: Jacquelin Hawking, MD   PCP: Kaleen Mask, MD   Recommendations at discharge:  PCP visit for hospital follow-up Cardiology visit for hospital follow-up Follow-up erythropoietin level Repeat CBC in 3 to 5 days to ensure improvement of polycythemia; patient may require hematology follow-up  Discharge Diagnoses: Principal Problem:   AKI (acute kidney injury) (HCC) Active Problems:   DM (diabetes mellitus), type 2 (HCC)   CAD (coronary artery disease) of artery bypass graft   Dyslipidemia   GERD (gastroesophageal reflux disease)   Intractable nausea and vomiting   Dehydration   Coffee ground emesis  Resolved Problems:   * No resolved hospital problems. *  Hospital Course: [name] [name] is a 63 y.o. male with a history of diabetes mellitus type 2, hypertension, CAD status post PCI/stent and CABG.  Patient presented secondary to recurrent nausea and vomiting with associated dehydration and acute kidney injury.  Patient is on IV fluids.  During hospitalization, patient developed left-sided atypical chest pain concerning with underlying cardiac history. Endoscopy significant for moderate esophagitis patient started on treatment with cervical fate and to continue Protonix.  During hospitalization, patient developed paroxysmal atrial fibrillation with RVR.  Cardiology consulted and medications adjusted for outpatient.  Patient to follow-up with primary care physician, cardiology, in addition to GI as needed for biopsy results.  Assessment and Plan:  AKI Baseline creatinine of about 1. Creatinine of 1.51 on admission. In setting of nausea/vomiting. IV fluids started. Creatinine improved.   Intractable nausea/vomiting Unclear etiology. No associated diarrhea. History of hiatal hernia, per chart review. CT abdomen/pelvis  significant for hepatic steatosis, otherwise nothing to explain symptoms. Upper endoscopy performed on 3/27 and was significant for moderate erosive esophagitis with concern for possible Barrett's esophagus; biopsy performed.  Patient discharged with Protonix, sucralfate (tablets provided as suspension was too expensive).  Patient to follow-up with [facility] gastroenterology for biopsy results.   Dark emesis Unclear if this was coffee-ground. Multiple episodes. Patient without significant risk factors for upper GI bleeding. No evidence of bleeding on upper endoscopy. Hemoglobin stable.   CAD History of PCI with stent and CABG. Patient follows with cardiology as an outpatient. Patient is on Crestor and aspirin as an outpatient. Aspirin held secondary to concern for possible GI bleeding. Continue Crestor.   Primary hypertension Patient is on metoprolol as an outpatient which was held on admission held on admission. Patient started on hydralazine IV prn; hydralazine discontinued.  Continue increased dose of metoprolol.  Continue home lisinopril.  Continue newly prescribed amlodipine.  Patient to follow-up with primary care versus cardiology for continued management.   Diabetes mellitus type 2 Uncontrolled with hyperglycemia based on hemoglobin A1C of 8.2%. Patient is on Lantus and Humalog as an outpatient. Blood sugar somewhat uncontrolled for inpatient.  Continue home regimen.   Paroxysmal atrial fibrillation with RVR Per  cardiology, patient with remote PAF at time of CABG. Patient in/out of atrial fibrillation with repid ventricular response and spontaneous conversion back to sinus rhythm.  Metoprolol increased to metoprolol 50 mg twice daily on discharge.  Patient declined thoracic echocardiogram.  Patient to follow-up with cardiology as an outpatient.   Polycythemia Remains persistent. Uncelar etiology. Hemoglobin and hematocrit of 19.06/56.2 respectively on admission.  Trending down slightly with  fluids.  With her protein level pending on discharge.  Recommend follow-up on pending labs.  Recommend repeat CBC as an outpatient.  Recommend hematology consult as needed.   Anxiety Continue Lexapro   GERD Continue Protonix   Hyperlipidemia Continue Crestor   Aortic atherosclerosis Continue Crestor   THC use Cessation counseled on admission..   Chest pain New symptoms. Per patient, likely GI related. EKG with new anterolateral ST-T segment changes. Concerning for possible NSTEMI although symptoms are atypical. No improvement with nitroglycerin. Transthoracic Echocardiogram ordered and patient declined procedure. CTA chest negative for acute process. Chest pain resolved.   Consultants: Cardiology, Gastroenterology Procedures performed: Upper endoscopy/esophgeal biopsy  Disposition: Home Diet recommendation: Cardiac diet   DISCHARGE MEDICATION: Allergies as of 08/07/2023   No Known Allergies      Medication List     STOP taking these medications    aspirin EC 81 MG tablet       TAKE these medications    amLODipine 2.5 MG tablet Commonly known as: NORVASC Take 1 tablet (2.5 mg total) by mouth daily. Start taking on: August 08, 2023   apixaban 5 MG Tabs tablet Commonly known as: ELIQUIS Take 1 tablet (5 mg total) by mouth 2 (two) times daily.   Dexcom G6 Transmitter Misc Change every 10 days as needed/directed   escitalopram 20 MG tablet Commonly known as: LEXAPRO Take 20 mg by mouth daily.   gabapentin 600 MG tablet Commonly known as: NEURONTIN Take 2 tablets by mouth every 12 (twelve) hours.   HumaLOG KwikPen 200 UNIT/ML KwikPen Generic drug: insulin lispro Max daily 110 units What changed:  how much to take how to take this when to take this   Insulin Pen Needle 29G X Misc 1 Device by Does not apply route in the morning, at noon, in the evening, and at bedtime.   Lantus SoloStar 100 UNIT/ML Solostar Pen Generic drug: insulin  glargine Inject 80 Units into the skin daily.   lisinopril 10 MG tablet Commonly known as: ZESTRIL Take 10 mg by mouth daily.   metoprolol tartrate 50 MG tablet Commonly known as: LOPRESSOR Take 1 tablet (50 mg total) by mouth 2 (two) times daily. What changed:  medication strength how much to take   OneTouch Delica Lancets 30G Misc 1 each by Does not apply route in the morning, at noon, in the evening, and at bedtime. Use Onetouch Delica lancets to check blood sugar 3-4 times daily.   OneTouch Ultra test strip Generic drug: glucose blood 1 EACH BY OTHER ROUTE 3 (THREE) TIMES DAILY. USE ONETOUCH ULTRA TEST STRIPS AS INSTRUCTED TO CHECK BLOOD SUGAR 3-4 TIMES DAILY.   pantoprazole 40 MG tablet Commonly known as: Protonix Take 1 tablet (40 mg total) by mouth 2 (two) times daily for 60 days, THEN 1 tablet (40 mg total) daily. Start taking on: August 07, 2023   rosuvastatin 40 MG tablet Commonly known as: CRESTOR Take 1 tablet (40 mg total) by mouth daily. Patient needs appointment for further refills. 3 rd/final attempt   sucralfate 1 g tablet Commonly known as: Carafate Take 1 tablet (1 g total) by mouth 4 (four) times daily -  with meals and at bedtime.        Follow-up Information     Kaleen Mask, MD. Schedule an appointment as soon as possible for a visit in 1 week(s).   Specialty: Family Medicine Why: For hospital follow-up Contact information: [address] [place] [address] [phone]         [facility]. Schedule an appointment as soon as possible  for a visit in 2 week(s).   Why: For hospital follow-up Contact information: [address] [place] [address] [phone]               Discharge Exam: BP (!) 158/87 (BP Location: Left Arm)   Pulse 75   Temp (!) 97 F (36.1 C) (Oral)   Resp 20   Ht 5\' 11"  (1.803 m)   Wt 99.1 kg   SpO2 97%   BMI 30.47 kg/m   General exam: Appears  calm and comfortable Respiratory system: Clear to auscultation. Respiratory effort normal. Cardiovascular system: S1 & S2 heard, RRR. Gastrointestinal system: Abdomen is nondistended, soft and nontender. Normal bowel sounds heard. Central nervous system: Alert and oriented. No focal neurological deficits. Psychiatry: Judgement and insight appear normal. Mood & affect appropriate.   Condition at discharge: stable  The results of significant diagnostics from this hospitalization (including imaging, microbiology, ancillary and laboratory) are listed below for reference.   Imaging Studies: CT Angio Chest Pulmonary Embolism (PE) W or WO Contrast Result Date: 08/05/2023 CLINICAL DATA:  Positive D-dimer. Chest pain. Concern for pulmonary embolism. EXAM: CT ANGIOGRAPHY CHEST WITH CONTRAST TECHNIQUE: Multidetector CT imaging of the chest was performed using the standard protocol during bolus administration of intravenous contrast. Multiplanar CT image reconstructions and MIPs were obtained to evaluate the vascular anatomy. RADIATION DOSE REDUCTION: This exam was performed according to the departmental dose-optimization program which includes automated exposure control, adjustment of the mA and/or kV according to patient size and/or use of iterative reconstruction technique. CONTRAST:  75mL OMNIPAQUE IOHEXOL 350 MG/ML SOLN COMPARISON:  Chest radiograph dated 08/04/2023. FINDINGS: Cardiovascular: There is no cardiomegaly or pericardial effusion. There is 3 vessel coronary vascular calcification and postsurgical changes of CABG. Mild atherosclerotic calcification of the thoracic aorta. No aneurysmal dilatation or dissection. The origins of the great vessels of the aortic arch appear patent. No pulmonary artery embolus identified. Mediastinum/Nodes: No hilar or mediastinal adenopathy. The esophagus is grossly unremarkable. No mediastinal fluid collection. Lungs/Pleura: No focal consolidation, pleural effusion,  pneumothorax. The central airways are patent. Upper Abdomen: No acute abnormality. Musculoskeletal: Median sternotomy wires. No acute osseous pathology. Review of the MIP images confirms the above findings. IMPRESSION: 1. No acute intrathoracic pathology. No CT evidence of pulmonary artery embolus. 2. Coronary vascular calcification and postsurgical changes of CABG. Electronically Signed   By: Elgie Collard M.D.   On: 08/05/2023 18:44   CT HEAD WO CONTRAST ( ) Result Date: 08/05/2023 CLINICAL DATA:  Headache, new onset (Age >= 51y) EXAM: CT HEAD WITHOUT CONTRAST TECHNIQUE: Contiguous axial images were obtained from the base of the skull through the vertex without intravenous contrast. RADIATION DOSE REDUCTION: This exam was performed according to the departmental dose-optimization program which includes automated exposure control, adjustment of the mA and/or kV according to patient size and/or use of iterative reconstruction technique. COMPARISON:  MRI head June 24, 23. FINDINGS: Brain: No evidence of acute infarction, hemorrhage, hydrocephalus, extra-axial collection or mass lesion/mass effect. Patchy white matter hypodensities are nonspecific but compatible with chronic microvascular ischemic change. Vascular: Residual contrast is likely due to contrasted CT abdomen/pelvis performed earlier today. Skull: No acute fracture. Sinuses/Orbits: Paranasal sinus mucosal thickening. Other: No mastoid effusions. IMPRESSION: No evidence of acute intracranial abnormality. Electronically Signed   By: Feliberto Harts M.D.   On: 08/05/2023 01:32   DG Chest Port 1 View Result Date: 08/04/2023 CLINICAL DATA:  Epigastric pain. EXAM: PORTABLE CHEST 1 VIEW COMPARISON:  12/30/2016. FINDINGS:  The heart size and mediastinal contours are stable. Lung volumes are low with atelectasis at the lung bases. No consolidation, effusion, or pneumothorax is seen. Sternotomy wires are present over the midline. IMPRESSION: Low lung  volumes with atelectasis at the lung bases. Electronically Signed   By: Thornell Sartorius M.D.   On: 08/04/2023 21:12   CT ABDOMEN PELVIS W CONTRAST Result Date: 08/04/2023 CLINICAL DATA:  Acute generalized abdominal pain. EXAM: CT ABDOMEN AND PELVIS WITH CONTRAST TECHNIQUE: Multidetector CT imaging of the abdomen and pelvis was performed using the standard protocol following bolus administration of intravenous contrast. RADIATION DOSE REDUCTION: This exam was performed according to the departmental dose-optimization program which includes automated exposure control, adjustment of the mA and/or kV according to patient size and/or use of iterative reconstruction technique. CONTRAST:  75mL OMNIPAQUE IOHEXOL 350 MG/ML SOLN COMPARISON:  May 21, 2022. FINDINGS: Lower chest: No acute abnormality. Hepatobiliary: Hepatic steatosis. No cholelithiasis or biliary dilatation. Pancreas: Unremarkable. No pancreatic ductal dilatation or surrounding inflammatory changes. Spleen: Normal in size without focal abnormality. Adrenals/Urinary Tract: Adrenal glands are unremarkable. Kidneys are normal, without renal calculi, focal lesion, or hydronephrosis. Bladder is unremarkable. Stomach/Bowel: Stomach is within normal limits. Appendix appears normal. No evidence of bowel wall thickening, distention, or inflammatory changes. Vascular/Lymphatic: Aortic atherosclerosis. No enlarged abdominal or pelvic lymph nodes. Reproductive: Prostate is unremarkable. Other: No abdominal wall hernia or abnormality. No abdominopelvic ascites. Musculoskeletal: Status post surgical posterior fusion of L5-S1. No acute osseous abnormalities noted. IMPRESSION: Hepatic steatosis. No acute abnormality seen in the abdomen or pelvis. Aortic Atherosclerosis (ICD10-I70.0). Electronically Signed   By: Lupita Raider M.D.   On: 08/04/2023 17:36    Microbiology: Results for orders placed or performed during the hospital encounter of 08/04/23  Resp panel by  RT-PCR (RSV, Flu A&B, Covid) Anterior Nasal Swab     Status: None   Collection Time: 08/04/23 12:50 PM   Specimen: Anterior Nasal Swab  Result Value Ref Range Status   SARS Coronavirus 2 by RT PCR NEGATIVE NEGATIVE Final   Influenza A by PCR NEGATIVE NEGATIVE Final   Influenza B by PCR NEGATIVE NEGATIVE Final    Comment: (NOTE) The Xpert Xpress SARS-CoV-2/FLU/RSV plus assay is intended as an aid in the diagnosis of influenza from Nasopharyngeal swab specimens and should not be used as a sole basis for treatment. Nasal washings and aspirates are unacceptable for Xpert Xpress SARS-CoV-2/FLU/RSV testing.  Fact Sheet for Patients: BloggerCourse.com  Fact Sheet for Healthcare Providers: SeriousBroker.it  This test is not yet approved or cleared by the [place] FDA and has been authorized for detection and/or diagnosis of SARS-CoV-2 by FDA under an Emergency Use Authorization (EUA). This EUA will remain in effect (meaning this test can be used) for the duration of the COVID-19 declaration under Section 564(b)(1) of the Act, 21 U.S.C. section 360bbb-3(b)(1), unless the authorization is terminated or revoked.     Resp Syncytial Virus by PCR NEGATIVE NEGATIVE Final    Comment: (NOTE) Fact Sheet for Patients: BloggerCourse.com  Fact Sheet for Healthcare Providers: SeriousBroker.it  This test is not yet approved or cleared by the [place] FDA and has been authorized for detection and/or diagnosis of SARS-CoV-2 by FDA under an Emergency Use Authorization (EUA). This EUA will remain in effect (meaning this test can be used) for the duration of the COVID-19 declaration under Section 564(b)(1) of the Act, 21 U.S.C. section 360bbb-3(b)(1), unless the authorization is terminated or revoked.  Performed at [facility],  [address] [place], [place] [address]      Labs: CBC: Recent Labs  Lab 08/04/23 1253 08/04/23 2035 08/05/23 0301 08/05/23 1103 08/05/23 1348 08/06/23 0312 08/07/23 0310  WBC 11.7*   < > 13.9* 14.7* 14.0* 15.6*  16.3* 10.1  NEUTROABS 9.2*  --   --   --   --  11.2*  --   HGB 20.1*   < > 18.4* 18.9* 18.1* 19.0*  19.2* 17.5*  HCT 57.2*   < > 53.4* 55.9* 52.5* 56.2*  57.1* 51.6  MCV 86.5   < > 87.8 88.0 87.1 88.9  89.2 87.9  PLT 277   < > 225 234 222 243  240 209   < > = values in this interval not displayed.   Basic Metabolic Panel: Recent Labs  Lab 08/04/23 1253 08/04/23 1955 08/04/23 2035 08/05/23 1103 08/06/23 0720  NA 142  --  141 143 141  K 3.8  --  3.6 3.8 4.2  CL 102  --  103 105 106  CO2 24  --  21* 24 21*  GLUCOSE 180*  --  272* 182* 183*  BUN 21  --  23 18 18   CREATININE 1.51*  --  1.42* 1.26* 1.05  CALCIUM 10.2  --  9.5 9.6 9.4  MG  --  2.2  --  2.3  --   PHOS  --  3.3  --  3.1  --    Liver Function Tests: Recent Labs  Lab 08/04/23 1253 08/05/23 1103 08/06/23 0720  AST 28 29 36  ALT 31 24 24   ALKPHOS 80 70 77  BILITOT 2.1* 1.8* 1.7*  PROT 7.8 7.8 6.9  ALBUMIN 4.6 4.4 4.1   CBG: Recent Labs  Lab 08/06/23 1922 08/06/23 2347 08/07/23 0432 08/07/23 0936 08/07/23 1207  GLUCAP 251* 146* 114* 281* 162*    Discharge time spent: 35 minutes.  Signed: Jacquelin Hawking, MD [facility] 08/07/2023

## 2023-08-07 NOTE — Plan of Care (Signed)
  Problem: Education: Goal: Knowledge of General Education information will improve Description Including pain rating scale, medication(s)/side effects and non-pharmacologic comfort measures Outcome: Progressing   Problem: Health Behavior/Discharge Planning: Goal: Ability to manage health-related needs will improve Outcome: Progressing   Problem: Clinical Measurements: Goal: Will remain free from infection Outcome: Progressing   Problem: Clinical Measurements: Goal: Cardiovascular complication will be avoided Outcome: Progressing   

## 2023-08-08 ENCOUNTER — Encounter (HOSPITAL_COMMUNITY): Payer: Self-pay | Admitting: Gastroenterology

## 2023-08-09 LAB — SURGICAL PATHOLOGY

## 2023-08-19 ENCOUNTER — Encounter: Payer: Self-pay | Admitting: Cardiology

## 2023-08-19 NOTE — Progress Notes (Signed)
 No show. Closing erroneous encounter. No charge.   Caitlin S Walker, NP

## 2023-08-20 ENCOUNTER — Ambulatory Visit: Attending: Cardiology | Admitting: Cardiology

## 2023-08-20 DIAGNOSIS — I480 Paroxysmal atrial fibrillation: Secondary | ICD-10-CM

## 2023-08-20 DIAGNOSIS — E785 Hyperlipidemia, unspecified: Secondary | ICD-10-CM

## 2023-08-20 DIAGNOSIS — I2510 Atherosclerotic heart disease of native coronary artery without angina pectoris: Secondary | ICD-10-CM

## 2023-08-20 DIAGNOSIS — I6523 Occlusion and stenosis of bilateral carotid arteries: Secondary | ICD-10-CM

## 2023-08-20 DIAGNOSIS — I10 Essential (primary) hypertension: Secondary | ICD-10-CM

## 2023-11-17 ENCOUNTER — Other Ambulatory Visit: Payer: Self-pay | Admitting: Internal Medicine

## 2023-11-18 ENCOUNTER — Telehealth: Payer: Self-pay

## 2023-11-18 NOTE — Telephone Encounter (Signed)
 Contact patient to schedule appointment

## 2024-01-26 LAB — HM DIABETES EYE EXAM
